# Patient Record
Sex: Male | Born: 1945 | Race: White | Hispanic: No | Marital: Married | Smoking: Never smoker
Health system: Southern US, Community
[De-identification: ages and names within clinical notes are randomized; demographics above are authoritative.]

## PROBLEM LIST (undated history)

## (undated) DIAGNOSIS — R1310 Dysphagia, unspecified: Secondary | ICD-10-CM

## (undated) DIAGNOSIS — K219 Gastro-esophageal reflux disease without esophagitis: Secondary | ICD-10-CM

## (undated) DIAGNOSIS — K7581 Nonalcoholic steatohepatitis (NASH): Secondary | ICD-10-CM

## (undated) DIAGNOSIS — K7460 Unspecified cirrhosis of liver: Secondary | ICD-10-CM

## (undated) DIAGNOSIS — G709 Myoneural disorder, unspecified: Secondary | ICD-10-CM

## (undated) DIAGNOSIS — I10 Essential (primary) hypertension: Secondary | ICD-10-CM

## (undated) DIAGNOSIS — E785 Hyperlipidemia, unspecified: Secondary | ICD-10-CM

## (undated) DIAGNOSIS — D696 Thrombocytopenia, unspecified: Secondary | ICD-10-CM

## (undated) HISTORY — DX: Hyperlipidemia, unspecified: E78.5

## (undated) HISTORY — PX: CHOLECYSTECTOMY: SHX55

## (undated) HISTORY — DX: Essential (primary) hypertension: I10

## (undated) HISTORY — PX: STOMACH SURGERY: SHX791

## (undated) HISTORY — DX: Thrombocytopenia, unspecified: D69.6

## (undated) HISTORY — DX: Dysphagia, unspecified: R13.10

## (undated) HISTORY — DX: Nonalcoholic steatohepatitis (NASH): K75.81

## (undated) HISTORY — DX: Myoneural disorder, unspecified: G70.9

---

## 2007-11-27 ENCOUNTER — Inpatient Hospital Stay (HOSPITAL_COMMUNITY): Admission: EM | Admit: 2007-11-27 | Discharge: 2007-11-29 | Payer: Self-pay | Admitting: Emergency Medicine

## 2007-11-28 ENCOUNTER — Encounter (INDEPENDENT_AMBULATORY_CARE_PROVIDER_SITE_OTHER): Payer: Self-pay | Admitting: General Surgery

## 2011-03-03 NOTE — Op Note (Signed)
NAME:  Peter Nichols, Peter Nichols NO.:  [id number]   MEDICAL RECORD NO.:  [id number]          PATIENT TYPE:  INP   LOCATION:  0737                         FACILITY:  [facility]   PHYSICIAN:  Gwenyth Ober, M.D.    DATE OF BIRTH:  03-24-1946   DATE OF PROCEDURE:  11/28/2007  DATE OF DISCHARGE:                               OPERATIVE REPORT   PREOPERATIVE DIAGNOSIS:  Symptomatic cholelithiasis and biliary colic.   POSTOPERATIVE DIAGNOSIS:  Acute cholecystitis, with cholelithiasis.   PROCEDURE:  Laparoscopic cholecystectomy, with laparoscopic enterolysis.   SURGEON:  Gwenyth Ober, M.D.   ANESTHESIA:  General endotracheal.   ESTIMATED BLOOD LOSS:  Less than 100 mL.   COMPLICATIONS:  Evulsion of cystic duct.   CONDITION:  Stable.   A 19 mm Blake drain was left in Morison's pouch and brought out the  right upper quadrant.   INDICATIONS FOR OPERATION:  The patient is a 65 year old with abdominal  pain localized to the right upper quadrant, a CT scan demonstrating a  gallstone at the neck of the gallbladder, without evidence of acute  cholecystitis.  He was brought to the operating room for a laparoscopic  cholecystectomy.   OPERATION:  The patient was taken to the operating room and placed on  the table in the supine position.  After an adequate endotracheal  anesthetic was administered, he was prepped and draped in the usual  sterile manner, exposing the midline and right upper quadrant.   A supraumbilical midline incision was made using a #15 blade and taken  down to the midline fascia.  We incised the fascia with a #15 blade,  then grabbed the edges with a Kocher clamp.  We then bluntly dissected  down with a hemostat clamp into the peritoneal cavity, running into some  omental adhesions to the anterior abdominal wall at the site of the  patient's previous midline incision.  We were able to find a window in  the inferolateral aspect of the incision over to the right,  and we were  able to dissect down into that area.  We then passed a pursestring  suture of 0 Vicryl around the fascial opening, in which we secured a  Hasson cannula which was subsequently passed with ease into the  peritoneal cavity.   Upon passing a 10 mm laparoscope into the peritoneal cavity through the  supraumbilical cannula, we were able to get around the omental adhesions  by going inferolaterally to the right and then up towards the right  upper quadrant.  We placed a 5 mm cannula into the peritoneal cavity  under direct vision, then used a 5 mm laparoscopic in order to inspect  the area of the supraumbilical cannula.  We could see the omental  adhesions that were impeding our progress, but we did not temperature to  take all of these down, just those that were near the subxiphoid area,  where we placed a 10/11 mm cannula under direct vision.  We placed a  second 5 mm cannula medially in the right upper quadrant.  With all  those in place,  we were able to pass the 10 mm cannula back through the  supraumbilical cannula site towards the right upper quadrant.   There was a significant amount of inflammation on the gallbladder which  was placed medially.  We were able to free up a lot of the omental  adhesions and surrounding adhesions from the gallbladder as we dissected  the gallbladder free and retracted towards the anterior abdominal wall  on the right upper quadrant.  It took a lot of dissection, and there was  a significant amount of bleeding, although not enough to require  transfusion or for the patient to become hypotensive.  It took a  considerable amount of time in order to dissect out the body and the  dome of the gallbladder and then subsequently the infundibulum, which we  were eventually able to find.  The cystic duct, which came along the  anteromedial aspect of the gallbladder wall, was partially torn during  the dissection and had to be clipped on the gallbladder  side prior to  finding its origin near the triangle of Calot and the hepatoduodenal  triangle.  Once we had dissected down to that area, which had a  significant amount of inflammation, we were able to dissect out the  cystic duct and the cystic artery and clip them appropriately.  We  clipped along the gallbladder side and made a cholecystodochotomy, and  upon attempting to pass the cholangiogram into the peritoneal cavity the  cystic duct tore at the site where it was transected, which was several  millimeters distal to the cystic duct/common duct junction.  Because of  this, no attempt was made to perform a cholangiogram at that point.  We  removed the cholangiocatheter from the peritoneal cavity, which had not  entered into the cystic duct, and then clipped the cystic duct x58m with  several centimeters and millimeters to spare.  We then clipped the  cystic artery proximally and distally, transected, and then dissected  out the gallbladder from its bed.  The gallbladder tore during the  dissection and spilled bilious contents into the Morison's pouch, which  we irrigated out copiously but did not include stones, just bile fluid.  The gallbladder was dissected out with minimal difficulty.  We did use  an EndoCatch bag to bring it out from the subxiphoid site, not the  supraumbilical site.  It was a very thickened gallbladder, with stones  contained within.   Once we removed the gallbladder, we irrigated with about 3 liters of  saline solution in the right upper quadrant, Morison's pouch, the  subhepatic space, and also down into the pelvis.  Once we had adequately  cleared out most of the effluent and most of the old blood loss, there  was no acute bleeding.  We cauterized the hepatic bed adequately.  We  placed a 19 mm Blake drain into Morison's pouch, passing alligator  through the lateralmost 5 mm cannula into the subxiphoid trocar and  bringing out the tubing into Morison's pouch  and then intact bringing  out skin in the right upper quadrant.  We secured it in place with a 3-0  nylon suture.  With that being done, we aspirated one more time above  the liver.  All fluid and gas were removed with all cannulas.   The supraumbilical fascial site was closed using the pursestring suture  which was in place.  We injected 0.25% Marcaine with epinephrine at all  sites except for around the  Blake drain.  We closed the skin at the  subxiphoid and supraumbilical site using a running subcuticular stitch  of 4-0 Vicryl.  We applied Dermabond to closed skin.  We closed the  remaining 5 mm cannula site with Dermabond.  We placed Steri-Strips and  Tegaderm.  All counts were correct.  We placed some sponges around the  drain site.  All needle, sponge counts, and instrument counts were  correct.      Gwenyth Ober, M.D.  Electronically Signed     JOW/MEDQ  D:  11/28/2007  T:  11/29/2007  Job:  [id number]
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
## 2011-07-10 LAB — DIFFERENTIAL
Basophils Absolute: 0
Basophils Relative: 0
Basophils Relative: 1
Eosinophils Absolute: 0.1
Eosinophils Relative: 0
Lymphocytes Relative: 11 — ABNORMAL LOW
Lymphocytes Relative: 4 — ABNORMAL LOW
Lymphs Abs: 0.5 — ABNORMAL LOW
Monocytes Absolute: 0.3
Monocytes Absolute: 0.6
Monocytes Relative: 9
Neutro Abs: 4.7
Neutrophils Relative %: 74
Neutrophils Relative %: 83 — ABNORMAL HIGH

## 2011-07-10 LAB — CBC
HCT: 35.2 — ABNORMAL LOW
HCT: 49
Hemoglobin: 12.5 — ABNORMAL LOW
MCHC: 35.5
MCHC: 35.6
MCV: 94.7
MCV: 94.9
MCV: 95.5
Platelets: 108 — ABNORMAL LOW
Platelets: 146 — ABNORMAL LOW
Platelets: 158
RDW: 12.3
RDW: 12.5
WBC: 5.7

## 2011-07-10 LAB — COMPREHENSIVE METABOLIC PANEL
ALT: 122 — ABNORMAL HIGH
AST: 43 — ABNORMAL HIGH
Albumin: 2.6 — ABNORMAL LOW
Albumin: 3.4 — ABNORMAL LOW
Alkaline Phosphatase: 42
BUN: 4 — ABNORMAL LOW
CO2: 24
Calcium: 8.7
Chloride: 99
Creatinine, Ser: 0.88
Creatinine, Ser: 0.98
GFR calc Af Amer: >60
GFR calc Af Amer: >60
Glucose, Bld: 110 — ABNORMAL HIGH
Glucose, Bld: 124 — ABNORMAL HIGH
Potassium: 3.3 — ABNORMAL LOW
Total Bilirubin: 3.1 — ABNORMAL HIGH
Total Protein: 7.2

## 2011-07-10 LAB — URINALYSIS, ROUTINE W REFLEX MICROSCOPIC
Bilirubin Urine: NEGATIVE
Hgb urine dipstick: NEGATIVE
Protein, ur: NEGATIVE
Urobilinogen, UA: 1

## 2012-02-17 DIAGNOSIS — R7989 Other specified abnormal findings of blood chemistry: Secondary | ICD-10-CM | POA: Diagnosis not present

## 2012-03-21 DIAGNOSIS — I10 Essential (primary) hypertension: Secondary | ICD-10-CM | POA: Diagnosis not present

## 2012-03-21 DIAGNOSIS — Z1211 Encounter for screening for malignant neoplasm of colon: Secondary | ICD-10-CM | POA: Diagnosis not present

## 2012-03-21 DIAGNOSIS — L989 Disorder of the skin and subcutaneous tissue, unspecified: Secondary | ICD-10-CM | POA: Diagnosis not present

## 2012-03-21 DIAGNOSIS — Z23 Encounter for immunization: Secondary | ICD-10-CM | POA: Diagnosis not present

## 2012-03-21 DIAGNOSIS — Z125 Encounter for screening for malignant neoplasm of prostate: Secondary | ICD-10-CM | POA: Diagnosis not present

## 2012-03-21 DIAGNOSIS — E7800 Pure hypercholesterolemia, unspecified: Secondary | ICD-10-CM | POA: Diagnosis not present

## 2012-03-21 DIAGNOSIS — Z0000 Encounter for general adult medical examination without abnormal findings: Secondary | ICD-10-CM | POA: Diagnosis not present

## 2012-09-19 DIAGNOSIS — I10 Essential (primary) hypertension: Secondary | ICD-10-CM | POA: Diagnosis not present

## 2012-09-19 DIAGNOSIS — E785 Hyperlipidemia, unspecified: Secondary | ICD-10-CM | POA: Diagnosis not present

## 2012-09-19 DIAGNOSIS — M62838 Other muscle spasm: Secondary | ICD-10-CM | POA: Diagnosis not present

## 2012-09-19 DIAGNOSIS — D696 Thrombocytopenia, unspecified: Secondary | ICD-10-CM | POA: Diagnosis not present

## 2012-09-19 DIAGNOSIS — Z125 Encounter for screening for malignant neoplasm of prostate: Secondary | ICD-10-CM | POA: Diagnosis not present

## 2012-09-19 DIAGNOSIS — R7301 Impaired fasting glucose: Secondary | ICD-10-CM | POA: Diagnosis not present

## 2012-09-19 DIAGNOSIS — L989 Disorder of the skin and subcutaneous tissue, unspecified: Secondary | ICD-10-CM | POA: Diagnosis not present

## 2012-11-21 DIAGNOSIS — D235 Other benign neoplasm of skin of trunk: Secondary | ICD-10-CM | POA: Diagnosis not present

## 2012-11-21 DIAGNOSIS — L820 Inflamed seborrheic keratosis: Secondary | ICD-10-CM | POA: Diagnosis not present

## 2012-11-21 DIAGNOSIS — L570 Actinic keratosis: Secondary | ICD-10-CM | POA: Diagnosis not present

## 2012-12-15 DIAGNOSIS — J329 Chronic sinusitis, unspecified: Secondary | ICD-10-CM | POA: Diagnosis not present

## 2013-09-08 DIAGNOSIS — E785 Hyperlipidemia, unspecified: Secondary | ICD-10-CM | POA: Diagnosis not present

## 2013-09-08 DIAGNOSIS — Z1211 Encounter for screening for malignant neoplasm of colon: Secondary | ICD-10-CM | POA: Diagnosis not present

## 2013-09-08 DIAGNOSIS — Z125 Encounter for screening for malignant neoplasm of prostate: Secondary | ICD-10-CM | POA: Diagnosis not present

## 2013-09-08 DIAGNOSIS — Z0000 Encounter for general adult medical examination without abnormal findings: Secondary | ICD-10-CM | POA: Diagnosis not present

## 2013-09-08 DIAGNOSIS — R946 Abnormal results of thyroid function studies: Secondary | ICD-10-CM | POA: Diagnosis not present

## 2013-09-08 DIAGNOSIS — E039 Hypothyroidism, unspecified: Secondary | ICD-10-CM | POA: Diagnosis not present

## 2013-09-08 DIAGNOSIS — I10 Essential (primary) hypertension: Secondary | ICD-10-CM | POA: Diagnosis not present

## 2013-09-08 DIAGNOSIS — Z23 Encounter for immunization: Secondary | ICD-10-CM | POA: Diagnosis not present

## 2013-12-08 DIAGNOSIS — K7689 Other specified diseases of liver: Secondary | ICD-10-CM | POA: Diagnosis not present

## 2013-12-08 DIAGNOSIS — R946 Abnormal results of thyroid function studies: Secondary | ICD-10-CM | POA: Diagnosis not present

## 2014-02-06 ENCOUNTER — Other Ambulatory Visit: Payer: Self-pay | Admitting: Gastroenterology

## 2014-02-06 DIAGNOSIS — K5730 Diverticulosis of large intestine without perforation or abscess without bleeding: Secondary | ICD-10-CM | POA: Diagnosis not present

## 2014-02-06 DIAGNOSIS — Z1211 Encounter for screening for malignant neoplasm of colon: Secondary | ICD-10-CM | POA: Diagnosis not present

## 2014-02-06 DIAGNOSIS — D126 Benign neoplasm of colon, unspecified: Secondary | ICD-10-CM | POA: Diagnosis not present

## 2014-03-29 DIAGNOSIS — R946 Abnormal results of thyroid function studies: Secondary | ICD-10-CM | POA: Diagnosis not present

## 2014-07-02 DIAGNOSIS — R946 Abnormal results of thyroid function studies: Secondary | ICD-10-CM | POA: Diagnosis not present

## 2014-10-30 DIAGNOSIS — Z1389 Encounter for screening for other disorder: Secondary | ICD-10-CM | POA: Diagnosis not present

## 2014-10-30 DIAGNOSIS — E785 Hyperlipidemia, unspecified: Secondary | ICD-10-CM | POA: Diagnosis not present

## 2014-10-30 DIAGNOSIS — Z23 Encounter for immunization: Secondary | ICD-10-CM | POA: Diagnosis not present

## 2014-10-30 DIAGNOSIS — R946 Abnormal results of thyroid function studies: Secondary | ICD-10-CM | POA: Diagnosis not present

## 2014-10-30 DIAGNOSIS — Z125 Encounter for screening for malignant neoplasm of prostate: Secondary | ICD-10-CM | POA: Diagnosis not present

## 2014-10-30 DIAGNOSIS — K7581 Nonalcoholic steatohepatitis (NASH): Secondary | ICD-10-CM | POA: Diagnosis not present

## 2014-10-30 DIAGNOSIS — I10 Essential (primary) hypertension: Secondary | ICD-10-CM | POA: Diagnosis not present

## 2014-11-08 DIAGNOSIS — H02831 Dermatochalasis of right upper eyelid: Secondary | ICD-10-CM | POA: Diagnosis not present

## 2014-11-08 DIAGNOSIS — H02834 Dermatochalasis of left upper eyelid: Secondary | ICD-10-CM | POA: Diagnosis not present

## 2014-11-08 DIAGNOSIS — H2513 Age-related nuclear cataract, bilateral: Secondary | ICD-10-CM | POA: Diagnosis not present

## 2014-12-03 DIAGNOSIS — L570 Actinic keratosis: Secondary | ICD-10-CM | POA: Diagnosis not present

## 2014-12-03 DIAGNOSIS — X32XXXD Exposure to sunlight, subsequent encounter: Secondary | ICD-10-CM | POA: Diagnosis not present

## 2014-12-03 DIAGNOSIS — L821 Other seborrheic keratosis: Secondary | ICD-10-CM | POA: Diagnosis not present

## 2014-12-03 DIAGNOSIS — L820 Inflamed seborrheic keratosis: Secondary | ICD-10-CM | POA: Diagnosis not present

## 2014-12-03 DIAGNOSIS — L814 Other melanin hyperpigmentation: Secondary | ICD-10-CM | POA: Diagnosis not present

## 2015-05-02 DIAGNOSIS — Z0000 Encounter for general adult medical examination without abnormal findings: Secondary | ICD-10-CM | POA: Diagnosis not present

## 2015-05-02 DIAGNOSIS — K7581 Nonalcoholic steatohepatitis (NASH): Secondary | ICD-10-CM | POA: Diagnosis not present

## 2015-05-02 DIAGNOSIS — E785 Hyperlipidemia, unspecified: Secondary | ICD-10-CM | POA: Diagnosis not present

## 2015-05-02 DIAGNOSIS — Z1389 Encounter for screening for other disorder: Secondary | ICD-10-CM | POA: Diagnosis not present

## 2015-05-02 DIAGNOSIS — I10 Essential (primary) hypertension: Secondary | ICD-10-CM | POA: Diagnosis not present

## 2015-11-15 DIAGNOSIS — I10 Essential (primary) hypertension: Secondary | ICD-10-CM | POA: Diagnosis not present

## 2015-11-15 DIAGNOSIS — J329 Chronic sinusitis, unspecified: Secondary | ICD-10-CM | POA: Diagnosis not present

## 2015-11-15 DIAGNOSIS — K7581 Nonalcoholic steatohepatitis (NASH): Secondary | ICD-10-CM | POA: Diagnosis not present

## 2015-11-15 DIAGNOSIS — E785 Hyperlipidemia, unspecified: Secondary | ICD-10-CM | POA: Diagnosis not present

## 2015-11-15 DIAGNOSIS — Z125 Encounter for screening for malignant neoplasm of prostate: Secondary | ICD-10-CM | POA: Diagnosis not present

## 2015-11-18 DIAGNOSIS — R7302 Impaired glucose tolerance (oral): Secondary | ICD-10-CM | POA: Diagnosis not present

## 2015-12-17 DIAGNOSIS — D696 Thrombocytopenia, unspecified: Secondary | ICD-10-CM | POA: Diagnosis not present

## 2015-12-17 DIAGNOSIS — K7581 Nonalcoholic steatohepatitis (NASH): Secondary | ICD-10-CM | POA: Diagnosis not present

## 2016-02-17 DIAGNOSIS — K7581 Nonalcoholic steatohepatitis (NASH): Secondary | ICD-10-CM | POA: Diagnosis not present

## 2016-07-01 DIAGNOSIS — Z1389 Encounter for screening for other disorder: Secondary | ICD-10-CM | POA: Diagnosis not present

## 2016-07-01 DIAGNOSIS — E785 Hyperlipidemia, unspecified: Secondary | ICD-10-CM | POA: Diagnosis not present

## 2016-07-01 DIAGNOSIS — I10 Essential (primary) hypertension: Secondary | ICD-10-CM | POA: Diagnosis not present

## 2016-07-01 DIAGNOSIS — Z0000 Encounter for general adult medical examination without abnormal findings: Secondary | ICD-10-CM | POA: Diagnosis not present

## 2016-09-08 DIAGNOSIS — M19012 Primary osteoarthritis, left shoulder: Secondary | ICD-10-CM | POA: Diagnosis not present

## 2017-02-03 DIAGNOSIS — R740 Nonspecific elevation of levels of transaminase and lactic acid dehydrogenase [LDH]: Secondary | ICD-10-CM | POA: Diagnosis not present

## 2017-02-24 DIAGNOSIS — D696 Thrombocytopenia, unspecified: Secondary | ICD-10-CM | POA: Diagnosis not present

## 2017-03-22 ENCOUNTER — Encounter: Payer: Self-pay | Admitting: Hematology and Oncology

## 2017-03-22 ENCOUNTER — Telehealth: Payer: Self-pay | Admitting: Hematology and Oncology

## 2017-03-22 NOTE — Telephone Encounter (Signed)
Pt cld to schedule a hem appt. Appt has been scheduled for the pt to see DR. Gudena on 7/17 at 1pm. A earlier date was offered, but pt stated he will be out of town. Letter mailed to the pt and faxed to the referring.

## 2017-04-05 DIAGNOSIS — L570 Actinic keratosis: Secondary | ICD-10-CM | POA: Diagnosis not present

## 2017-04-05 DIAGNOSIS — X32XXXD Exposure to sunlight, subsequent encounter: Secondary | ICD-10-CM | POA: Diagnosis not present

## 2017-05-04 ENCOUNTER — Ambulatory Visit (HOSPITAL_COMMUNITY)
Admission: RE | Admit: 2017-05-04 | Discharge: 2017-05-04 | Disposition: A | Discharge status: Home / Self Care | Payer: Medicare Other | Source: Ambulatory Visit | Attending: Vascular Surgery | Admitting: Vascular Surgery

## 2017-05-04 ENCOUNTER — Ambulatory Visit (HOSPITAL_BASED_OUTPATIENT_CLINIC_OR_DEPARTMENT_OTHER): Payer: Medicare Other | Admitting: Hematology and Oncology

## 2017-05-04 ENCOUNTER — Ambulatory Visit (HOSPITAL_BASED_OUTPATIENT_CLINIC_OR_DEPARTMENT_OTHER): Payer: Medicare Other

## 2017-05-04 DIAGNOSIS — D696 Thrombocytopenia, unspecified: Secondary | ICD-10-CM | POA: Diagnosis not present

## 2017-05-04 LAB — CBC WITH DIFFERENTIAL/PLATELET
BASO%: 1.1 % (ref 0.0–2.0)
BASOS ABS: 0.1 10*3/uL (ref 0.0–0.1)
EOS ABS: 0.3 10*3/uL (ref 0.0–0.5)
EOS%: 5.1 % (ref 0.0–7.0)
HCT: 45.9 % (ref 38.4–49.9)
HGB: 16 g/dL (ref 13.0–17.1)
LYMPH%: 23.9 % (ref 14.0–49.0)
MCH: 33 pg (ref 27.2–33.4)
MCHC: 34.8 g/dL (ref 32.0–36.0)
MCV: 94.8 fL (ref 79.3–98.0)
MONO#: 0.6 10*3/uL (ref 0.1–0.9)
MONO%: 10.8 % (ref 0.0–14.0)
NEUT#: 3.3 10*3/uL (ref 1.5–6.5)
NEUT%: 59.1 % (ref 39.0–75.0)
PLATELETS: 100 10*3/uL — AB (ref 140–400)
RBC: 4.84 10*6/uL (ref 4.20–5.82)
RDW: 13.8 % (ref 11.0–14.6)
WBC: 5.6 10*3/uL (ref 4.0–10.3)
lymph#: 1.3 10*3/uL (ref 0.9–3.3)

## 2017-05-04 NOTE — Assessment & Plan Note (Signed)
Most likely related to alcohol abuse 02/24/2017 Platelet count was 89; ALT 43, AST 56, bilirubin 2, hepatitis B and C negative 10/30/2014: Platelet count 151 11/15/2015: Platelet count 107  I discussed with the patient extensively about the different ways alcohol can cause thrombocytopenia 1. Primary bone marrow toxin: It could cause dysfunction in the megakaryocytes 2. liver cirrhosis causing splenomegaly which can decrease the platelet count  Recommendation: Stop alcohol use completely and join AA program There is no need to perform a bone marrow biopsy. If he does not quit drinking then it would progressively get worse.  We are happy to see the patient on an as-needed basis. No further testing or interventions are needed at his current platelet levels. Return to clinic on an as-needed basis

## 2017-05-04 NOTE — Progress Notes (Signed)
Per Dr.Gudena, pt to have ultrasound of bilateral lower extremities today and to schedule pt for ultrasound of the abdomen next week. Pt notified and is aware. Pt scheduled at the vascular and vein specialist today at 3pm for stat US BLE r/o dvt.
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
## 2017-05-05 LAB — VITAMIN B12: VITAMIN B 12: 371 pg/mL (ref 232–1245)

## 2017-05-10 ENCOUNTER — Ambulatory Visit (HOSPITAL_COMMUNITY)
Admission: RE | Admit: 2017-05-10 | Discharge: 2017-05-10 | Disposition: A | Discharge status: Home / Self Care | Payer: Medicare Other | Source: Ambulatory Visit | Attending: Hematology and Oncology | Admitting: Hematology and Oncology

## 2017-05-10 DIAGNOSIS — D696 Thrombocytopenia, unspecified: Secondary | ICD-10-CM | POA: Insufficient documentation

## 2017-05-10 DIAGNOSIS — R161 Splenomegaly, not elsewhere classified: Secondary | ICD-10-CM | POA: Diagnosis not present

## 2017-05-10 DIAGNOSIS — Z9049 Acquired absence of other specified parts of digestive tract: Secondary | ICD-10-CM | POA: Diagnosis not present

## 2017-05-11 ENCOUNTER — Ambulatory Visit: Payer: PRIVATE HEALTH INSURANCE | Admitting: Hematology and Oncology

## 2017-05-11 NOTE — Assessment & Plan Note (Deleted)
Most likely related to alcohol abuse 02/24/2017 Platelet count was 89; ALT 43, AST 56, bilirubin 2, hepatitis B and C negative 10/30/2014: Platelet count 151 11/15/2015: Platelet count 107 Ultrasound abdomen 05/10/2017: Splenomegaly, fatty liver  I discussed with the patient extensively about the different ways alcohol can cause thrombocytopenia 1. Primary bone marrow toxin: It could cause dysfunction in the megakaryocytes 2. liver cirrhosis causing splenomegaly which can decrease the platelet count  Recommendation: Stop alcohol use completely and join AA program  Lower extremity swelling: Ultrasound of the legs negative for DVT. Patient is planning to retire at the end of this year.  Return to clinic on an as-needed basis.

## 2017-05-18 ENCOUNTER — Telehealth: Payer: Self-pay

## 2017-05-18 NOTE — Telephone Encounter (Signed)
Pt called for US abdomen result of 7/23. He states this is 2nd call today.

## 2017-07-05 DIAGNOSIS — K7581 Nonalcoholic steatohepatitis (NASH): Secondary | ICD-10-CM | POA: Diagnosis not present

## 2017-07-05 DIAGNOSIS — R946 Abnormal results of thyroid function studies: Secondary | ICD-10-CM | POA: Diagnosis not present

## 2017-07-05 DIAGNOSIS — D696 Thrombocytopenia, unspecified: Secondary | ICD-10-CM | POA: Diagnosis not present

## 2017-07-05 DIAGNOSIS — E785 Hyperlipidemia, unspecified: Secondary | ICD-10-CM | POA: Diagnosis not present

## 2017-07-05 DIAGNOSIS — I10 Essential (primary) hypertension: Secondary | ICD-10-CM | POA: Diagnosis not present

## 2017-07-05 DIAGNOSIS — Z23 Encounter for immunization: Secondary | ICD-10-CM | POA: Diagnosis not present

## 2017-07-05 DIAGNOSIS — Z0000 Encounter for general adult medical examination without abnormal findings: Secondary | ICD-10-CM | POA: Diagnosis not present

## 2017-08-04 DIAGNOSIS — D696 Thrombocytopenia, unspecified: Secondary | ICD-10-CM | POA: Diagnosis not present

## 2017-08-04 DIAGNOSIS — R740 Nonspecific elevation of levels of transaminase and lactic acid dehydrogenase [LDH]: Secondary | ICD-10-CM | POA: Diagnosis not present

## 2018-01-03 DIAGNOSIS — R946 Abnormal results of thyroid function studies: Secondary | ICD-10-CM | POA: Diagnosis not present

## 2018-02-14 DIAGNOSIS — L570 Actinic keratosis: Secondary | ICD-10-CM | POA: Diagnosis not present

## 2018-02-14 DIAGNOSIS — L820 Inflamed seborrheic keratosis: Secondary | ICD-10-CM | POA: Diagnosis not present

## 2018-02-14 DIAGNOSIS — X32XXXD Exposure to sunlight, subsequent encounter: Secondary | ICD-10-CM | POA: Diagnosis not present

## 2018-11-04 DIAGNOSIS — L570 Actinic keratosis: Secondary | ICD-10-CM | POA: Diagnosis not present

## 2018-11-04 DIAGNOSIS — S70362A Insect bite (nonvenomous), left thigh, initial encounter: Secondary | ICD-10-CM | POA: Diagnosis not present

## 2018-11-04 DIAGNOSIS — L986 Other infiltrative disorders of the skin and subcutaneous tissue: Secondary | ICD-10-CM | POA: Diagnosis not present

## 2018-11-04 DIAGNOSIS — L728 Other follicular cysts of the skin and subcutaneous tissue: Secondary | ICD-10-CM | POA: Diagnosis not present

## 2018-11-04 DIAGNOSIS — X32XXXD Exposure to sunlight, subsequent encounter: Secondary | ICD-10-CM | POA: Diagnosis not present

## 2019-01-04 ENCOUNTER — Ambulatory Visit
Admission: RE | Admit: 2019-01-04 | Discharge: 2019-01-04 | Disposition: A | Discharge status: Home / Self Care | Payer: PRIVATE HEALTH INSURANCE | Source: Ambulatory Visit | Attending: Family Medicine | Admitting: Family Medicine

## 2019-01-04 ENCOUNTER — Other Ambulatory Visit: Payer: Self-pay | Admitting: Family Medicine

## 2019-01-04 DIAGNOSIS — M7989 Other specified soft tissue disorders: Secondary | ICD-10-CM | POA: Diagnosis not present

## 2019-01-04 DIAGNOSIS — I10 Essential (primary) hypertension: Secondary | ICD-10-CM | POA: Diagnosis not present

## 2019-01-04 DIAGNOSIS — R7989 Other specified abnormal findings of blood chemistry: Secondary | ICD-10-CM

## 2019-01-04 DIAGNOSIS — D696 Thrombocytopenia, unspecified: Secondary | ICD-10-CM | POA: Diagnosis not present

## 2019-01-04 DIAGNOSIS — E785 Hyperlipidemia, unspecified: Secondary | ICD-10-CM | POA: Diagnosis not present

## 2019-01-04 DIAGNOSIS — K7581 Nonalcoholic steatohepatitis (NASH): Secondary | ICD-10-CM | POA: Diagnosis not present

## 2019-01-04 DIAGNOSIS — R600 Localized edema: Secondary | ICD-10-CM | POA: Diagnosis not present

## 2019-01-04 DIAGNOSIS — R946 Abnormal results of thyroid function studies: Secondary | ICD-10-CM | POA: Diagnosis not present

## 2019-01-04 DIAGNOSIS — F1020 Alcohol dependence, uncomplicated: Secondary | ICD-10-CM | POA: Diagnosis not present

## 2019-04-06 DIAGNOSIS — D696 Thrombocytopenia, unspecified: Secondary | ICD-10-CM | POA: Diagnosis not present

## 2019-04-06 DIAGNOSIS — E785 Hyperlipidemia, unspecified: Secondary | ICD-10-CM | POA: Diagnosis not present

## 2019-04-06 DIAGNOSIS — R7309 Other abnormal glucose: Secondary | ICD-10-CM | POA: Diagnosis not present

## 2019-04-06 DIAGNOSIS — F1020 Alcohol dependence, uncomplicated: Secondary | ICD-10-CM | POA: Diagnosis not present

## 2019-04-06 DIAGNOSIS — I10 Essential (primary) hypertension: Secondary | ICD-10-CM | POA: Diagnosis not present

## 2019-04-06 DIAGNOSIS — G250 Essential tremor: Secondary | ICD-10-CM | POA: Diagnosis not present

## 2019-08-17 DIAGNOSIS — Z86010 Personal history of colonic polyps: Secondary | ICD-10-CM | POA: Diagnosis not present

## 2019-08-17 DIAGNOSIS — D696 Thrombocytopenia, unspecified: Secondary | ICD-10-CM | POA: Diagnosis not present

## 2019-08-28 DIAGNOSIS — D696 Thrombocytopenia, unspecified: Secondary | ICD-10-CM | POA: Diagnosis not present

## 2019-08-28 DIAGNOSIS — X32XXXD Exposure to sunlight, subsequent encounter: Secondary | ICD-10-CM | POA: Diagnosis not present

## 2019-08-28 DIAGNOSIS — L570 Actinic keratosis: Secondary | ICD-10-CM | POA: Diagnosis not present

## 2019-08-31 DIAGNOSIS — F1020 Alcohol dependence, uncomplicated: Secondary | ICD-10-CM | POA: Diagnosis not present

## 2019-08-31 DIAGNOSIS — R7309 Other abnormal glucose: Secondary | ICD-10-CM | POA: Diagnosis not present

## 2019-08-31 DIAGNOSIS — D696 Thrombocytopenia, unspecified: Secondary | ICD-10-CM | POA: Diagnosis not present

## 2019-09-28 DIAGNOSIS — Z1159 Encounter for screening for other viral diseases: Secondary | ICD-10-CM | POA: Diagnosis not present

## 2019-09-29 DIAGNOSIS — I10 Essential (primary) hypertension: Secondary | ICD-10-CM | POA: Diagnosis not present

## 2019-09-29 DIAGNOSIS — F1020 Alcohol dependence, uncomplicated: Secondary | ICD-10-CM | POA: Diagnosis not present

## 2019-09-29 DIAGNOSIS — E785 Hyperlipidemia, unspecified: Secondary | ICD-10-CM | POA: Diagnosis not present

## 2019-09-29 DIAGNOSIS — D696 Thrombocytopenia, unspecified: Secondary | ICD-10-CM | POA: Diagnosis not present

## 2019-09-29 DIAGNOSIS — Z0000 Encounter for general adult medical examination without abnormal findings: Secondary | ICD-10-CM | POA: Diagnosis not present

## 2019-09-29 DIAGNOSIS — R946 Abnormal results of thyroid function studies: Secondary | ICD-10-CM | POA: Diagnosis not present

## 2019-09-29 DIAGNOSIS — M7989 Other specified soft tissue disorders: Secondary | ICD-10-CM | POA: Diagnosis not present

## 2019-09-29 DIAGNOSIS — K7581 Nonalcoholic steatohepatitis (NASH): Secondary | ICD-10-CM | POA: Diagnosis not present

## 2019-09-29 DIAGNOSIS — Z125 Encounter for screening for malignant neoplasm of prostate: Secondary | ICD-10-CM | POA: Diagnosis not present

## 2019-10-03 DIAGNOSIS — D120 Benign neoplasm of cecum: Secondary | ICD-10-CM | POA: Diagnosis not present

## 2019-10-03 DIAGNOSIS — K5730 Diverticulosis of large intestine without perforation or abscess without bleeding: Secondary | ICD-10-CM | POA: Diagnosis not present

## 2019-10-03 DIAGNOSIS — D123 Benign neoplasm of transverse colon: Secondary | ICD-10-CM | POA: Diagnosis not present

## 2019-10-03 DIAGNOSIS — Z86010 Personal history of colonic polyps: Secondary | ICD-10-CM | POA: Diagnosis not present

## 2019-10-06 DIAGNOSIS — D120 Benign neoplasm of cecum: Secondary | ICD-10-CM | POA: Diagnosis not present

## 2019-10-06 DIAGNOSIS — D123 Benign neoplasm of transverse colon: Secondary | ICD-10-CM | POA: Diagnosis not present

## 2019-10-27 ENCOUNTER — Telehealth (HOSPITAL_COMMUNITY): Payer: Self-pay

## 2019-10-27 NOTE — Telephone Encounter (Signed)
The above patient or their representative was contacted and gave the following answers to these questions:         Do you have any of the following symptoms?    NO  Fever                    Cough                   Shortness of breath  Do  you have any of the following other symptoms?    muscle pain         vomiting,        diarrhea        rash         weakness        red eye        abdominal pain         bruising          bruising or bleeding              joint pain           severe headache    Have you been in contact with someone who was or has been sick in the past 2 weeks?  NO  Yes                 Unsure                         Unable to assess   Does the person that you were in contact with have any of the following symptoms?   Cough         shortness of breath           muscle pain         vomiting,            diarrhea            rash            weakness           fever            red eye           abdominal pain           bruising  or  bleeding                joint pain                severe headache                 COMMENTS OR ACTION PLAN FOR THIS PATIENT:        All questions were answered/[facility]

## 2019-10-30 ENCOUNTER — Other Ambulatory Visit: Payer: Self-pay

## 2019-10-30 ENCOUNTER — Other Ambulatory Visit: Payer: Self-pay | Admitting: *Deleted

## 2019-10-30 ENCOUNTER — Encounter: Payer: Self-pay | Admitting: Surgery

## 2019-10-30 ENCOUNTER — Ambulatory Visit (HOSPITAL_COMMUNITY)
Admission: RE | Admit: 2019-10-30 | Discharge: 2019-10-30 | Disposition: A | Discharge status: Home / Self Care | Payer: Medicare HMO | Source: Ambulatory Visit | Attending: Surgery | Admitting: Surgery

## 2019-10-30 ENCOUNTER — Ambulatory Visit (INDEPENDENT_AMBULATORY_CARE_PROVIDER_SITE_OTHER): Payer: Medicare HMO | Admitting: Surgery

## 2019-10-30 VITALS — BP 126/65 | HR 55 | Temp 98.2°F | Resp 20 | Ht 70.0 in | Wt 196.0 lb

## 2019-10-30 DIAGNOSIS — R609 Edema, unspecified: Secondary | ICD-10-CM

## 2019-10-30 DIAGNOSIS — M7989 Other specified soft tissue disorders: Secondary | ICD-10-CM | POA: Diagnosis not present

## 2019-10-30 NOTE — Progress Notes (Signed)
[facility]  Patient name: Peter Nichols MRN: [id number] DOB: Oct 05, 1946 Sex: male   REQUESTING PROVIDER:    [name]   REASON FOR CONSULT:    Leg swelling  HISTORY OF PRESENT ILLNESS:   Peter Nichols is a 74 y.o. male, who is referred for evaluation of chronic leg swelling.  Patient states that this is been going on for many many years.  He has had several ultrasounds which were negative for DVT.  He has worn compression stockings briefly in the past but had difficulty tolerating them.  He denies any open wounds.  He is medically managed for hypertension.  He takes a statin for hypercholesterolemia.  He is a non-smoker.  PAST MEDICAL HISTORY    Past Medical History:  Diagnosis Date  . Dysphagia   . Hyperlipidemia   . Hypertension   . Neuromuscular disorder (HCC)    tremor  . Steatohepatitis, non-alcoholic   . Thrombocytopenia ([place])      FAMILY HISTORY   History reviewed. No pertinent family history.  SOCIAL HISTORY:   Social History   Socioeconomic History  . Marital status: Married    Spouse name: Not on file  . Number of children: Not on file  . Years of education: Not on file  . Highest education level: Not on file  Occupational History  . Not on file  Tobacco Use  . Smoking status: Never Smoker  . Smokeless tobacco: Never Used  Substance and Sexual Activity  . Alcohol use: Yes  . Drug use: Never  . Sexual activity: Not on file  Other Topics Concern  . Not on file  Social History Narrative  . Not on file   Social Determinants of Health   Financial Resource Strain:   . Difficulty of Paying Living Expenses: Not on file  Food Insecurity:   . Worried About Charity fundraiser in the Last Year: Not on file  . Ran Out of Food in the Last Year: Not on file  Transportation Needs:   . Lack of Transportation (Medical): Not on file  . Lack of Transportation (Non-Medical): Not on  file  Physical Activity:   . Days of Exercise per Week: Not on file  . Minutes of Exercise per Session: Not on file  Stress:   . Feeling of Stress : Not on file  Social Connections:   . Frequency of Communication with Friends and Family: Not on file  . Frequency of Social Gatherings with Friends and Family: Not on file  . Attends Religious Services: Not on file  . Active Member of Clubs or Organizations: Not on file  . Attends Archivist Meetings: Not on file  . Marital Status: Not on file  Intimate Partner Violence:   . Fear of Current or Ex-Partner: Not on file  . Emotionally Abused: Not on file  . Physically Abused: Not on file  . Sexually Abused: Not on file    ALLERGIES:    No Known Allergies  CURRENT MEDICATIONS:    Current Outpatient Medications  Medication Sig Dispense Refill  . amLODipine (NORVASC) 5 MG tablet Take 5 mg by mouth daily.     . benazepril (LOTENSIN) 20 MG tablet Take 20 mg by mouth daily.     . chlorthalidone (HYGROTON) 25 MG tablet Take 25 mg by mouth daily.     . pravastatin (PRAVACHOL) 40 MG tablet Take 40 mg by mouth daily.     . propranolol  ER (INDERAL LA) 60 MG 24 hr capsule Take 60 mg by mouth daily.      No current facility-administered medications for this visit.    REVIEW OF SYSTEMS:   [X]  denotes positive finding, [ ]  denotes negative finding Cardiac  Comments:  Chest pain or chest pressure:    Shortness of breath upon exertion:    Short of breath when lying flat:    Irregular heart rhythm:        Vascular    Pain in calf, thigh, or hip brought on by ambulation:    Pain in feet at night that wakes you up from your sleep:     Blood clot in your veins:    Leg swelling:  x       Pulmonary    Oxygen at home:    Productive cough:     Wheezing:         Neurologic    Sudden weakness in arms or legs:     Sudden numbness in arms or legs:     Sudden onset of difficulty speaking or slurred speech:    Temporary loss of  vision in one eye:     Problems with dizziness:         Gastrointestinal    Blood in stool:      Vomited blood:         Genitourinary    Burning when urinating:     Blood in urine:        Psychiatric    Major depression:         Hematologic    Bleeding problems:    Problems with blood clotting too easily:        Skin    Rashes or ulcers:        Constitutional    Fever or chills:     PHYSICAL EXAM:   Vitals:   10/30/19 0853  BP: 126/65  Pulse: (!) 55  Resp: 20  Temp: 98.2 F (36.8 C)  SpO2: 98%  Weight: 196 lb (88.9 kg)  Height: 5' 10"  (1.778 m)    GENERAL: The patient is a well-nourished male, in no acute distress. The vital signs are documented above. CARDIAC: There is a regular rate and rhythm.  VASCULAR: Palpable dorsalis pedis pulse bilaterally.  1-2+ bilateral pitting edema with hyperpigmentation in the medial ankle bilaterally PULMONARY: Nonlabored respirations ABDOMEN: Soft and non-tender with normal pitched bowel sounds.  NEUROLOGIC: No focal weakness or paresthesias are detected. SKIN: There are no ulcers or rashes noted. PSYCHIATRIC: The patient has a normal affect.  STUDIES:   I have reviewed the following: Right: No evidence of common femoral vein obstruction. Left: No evidence of DVT, SVT, or Baker's cyst. The sapheno-femoral junction is competent. No evidence of GSV reflux. No evidence of SSV reflux. No evidence of deep venous reflux.  ASSESSMENT and PLAN   Chronic leg swelling: Venous evaluation was unremarkable.  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  However he will need confirmation  that this is not a manifestation of renal insufficiency or heart failure.  He may benefit from a trial of diuretics, however this usually does not have a significant impact.  I have recommended that he wear 20-30 compression stockings.  We also discussed that if he had difficulty with the knee-high, he may benefit from thigh-high stockings.  I  am also making a referral to the lymphedema clinic for further management.   Annamarie Major, IV, MD, FACS  [facility] [phone] Pager [phone]

## 2019-12-03 ENCOUNTER — Ambulatory Visit: Payer: Medicare HMO | Attending: Internal Medicine

## 2019-12-03 DIAGNOSIS — Z23 Encounter for immunization: Secondary | ICD-10-CM | POA: Insufficient documentation

## 2019-12-03 NOTE — Progress Notes (Signed)
   Covid-19 Vaccination Clinic  Name:  Peter Nichols    MRN: [id number] DOB: [birth year]  12/03/2019  [name] was observed post Covid-19 immunization for 15 minutes without incidence. He was provided with Vaccine Information Sheet and instruction to access the V-Safe system.   [name] was instructed to call 911 with any severe reactions post vaccine: Marland Kitchen Difficulty breathing  . Swelling of your face and throat  . A fast heartbeat  . A bad rash all over your body  . Dizziness and weakness    Immunizations Administered    Name Date Dose VIS Date Route   Pfizer COVID-19 Vaccine 12/03/2019  8:56 AM 0.3 mL 09/29/2019 Intramuscular   Manufacturer: [place]   Lot: [id number]   [place]: [address]-2

## 2019-12-25 ENCOUNTER — Ambulatory Visit: Payer: Medicare HMO | Attending: Internal Medicine

## 2019-12-25 DIAGNOSIS — Z23 Encounter for immunization: Secondary | ICD-10-CM | POA: Insufficient documentation

## 2019-12-25 NOTE — Progress Notes (Signed)
   Covid-19 Vaccination Clinic  Name:  Peter Nichols    MRN: [id number] DOB: 1946-04-17  12/25/2019  Mr. Scheurich was observed post Covid-19 immunization for 15 minutes without incident. He was provided with Vaccine Information Sheet and instruction to access the V-Safe system.   Mr. Gidney was instructed to call 911 with any severe reactions post vaccine: Marland Kitchen Difficulty breathing  . Swelling of face and throat  . A fast heartbeat  . A bad rash all over body  . Dizziness and weakness   Immunizations Administered    Name Date Dose VIS Date Route   Pfizer COVID-19 Vaccine 12/25/2019  5:56 PM 0.3 mL 09/29/2019 Intramuscular   Manufacturer: [place]   Lot: [id number]   [place]: [id number]

## 2020-03-29 DIAGNOSIS — I890 Lymphedema, not elsewhere classified: Secondary | ICD-10-CM | POA: Diagnosis not present

## 2020-03-29 DIAGNOSIS — Z125 Encounter for screening for malignant neoplasm of prostate: Secondary | ICD-10-CM | POA: Diagnosis not present

## 2020-03-29 DIAGNOSIS — D696 Thrombocytopenia, unspecified: Secondary | ICD-10-CM | POA: Diagnosis not present

## 2020-03-29 DIAGNOSIS — E785 Hyperlipidemia, unspecified: Secondary | ICD-10-CM | POA: Diagnosis not present

## 2020-03-29 DIAGNOSIS — R946 Abnormal results of thyroid function studies: Secondary | ICD-10-CM | POA: Diagnosis not present

## 2020-03-29 DIAGNOSIS — I10 Essential (primary) hypertension: Secondary | ICD-10-CM | POA: Diagnosis not present

## 2020-03-29 DIAGNOSIS — F1020 Alcohol dependence, uncomplicated: Secondary | ICD-10-CM | POA: Diagnosis not present

## 2020-07-15 DIAGNOSIS — X32XXXD Exposure to sunlight, subsequent encounter: Secondary | ICD-10-CM | POA: Diagnosis not present

## 2020-07-15 DIAGNOSIS — L570 Actinic keratosis: Secondary | ICD-10-CM | POA: Diagnosis not present

## 2020-07-17 DIAGNOSIS — H903 Sensorineural hearing loss, bilateral: Secondary | ICD-10-CM | POA: Diagnosis not present

## 2020-08-31 ENCOUNTER — Ambulatory Visit: Payer: Medicare HMO | Attending: Internal Medicine

## 2020-08-31 DIAGNOSIS — Z23 Encounter for immunization: Secondary | ICD-10-CM

## 2020-08-31 NOTE — Progress Notes (Signed)
   Covid-19 Vaccination Clinic  Name:  Peter Nichols    MRN: [id number] DOB: Dec 08, 1945  08/31/2020  Peter Nichols was observed post Covid-19 immunization for 15 minutes without incident. He was provided with Vaccine Information Sheet and instruction to access the V-Safe system.   Peter Nichols was instructed to call 911 with any severe reactions post vaccine: Marland Kitchen Difficulty breathing  . Swelling of face and throat  . A fast heartbeat  . A bad rash all over body  . Dizziness and weakness   Immunizations Administered    Name Date Dose VIS Date Route   Pfizer COVID-19 Vaccine 08/31/2020  3:12 PM 0.3 mL 08/07/2020 Intramuscular   Manufacturer: [employer], [employer]   Lot: [id number]   [place]: [id number]

## 2020-10-02 DIAGNOSIS — E785 Hyperlipidemia, unspecified: Secondary | ICD-10-CM | POA: Diagnosis not present

## 2020-10-02 DIAGNOSIS — F1020 Alcohol dependence, uncomplicated: Secondary | ICD-10-CM | POA: Diagnosis not present

## 2020-10-02 DIAGNOSIS — Z23 Encounter for immunization: Secondary | ICD-10-CM | POA: Diagnosis not present

## 2020-10-02 DIAGNOSIS — D696 Thrombocytopenia, unspecified: Secondary | ICD-10-CM | POA: Diagnosis not present

## 2020-10-02 DIAGNOSIS — I10 Essential (primary) hypertension: Secondary | ICD-10-CM | POA: Diagnosis not present

## 2020-10-02 DIAGNOSIS — Z0000 Encounter for general adult medical examination without abnormal findings: Secondary | ICD-10-CM | POA: Diagnosis not present

## 2020-10-02 DIAGNOSIS — R946 Abnormal results of thyroid function studies: Secondary | ICD-10-CM | POA: Diagnosis not present

## 2020-10-02 DIAGNOSIS — I890 Lymphedema, not elsewhere classified: Secondary | ICD-10-CM | POA: Diagnosis not present

## 2020-10-02 DIAGNOSIS — R600 Localized edema: Secondary | ICD-10-CM | POA: Diagnosis not present

## 2021-01-27 DIAGNOSIS — X32XXXD Exposure to sunlight, subsequent encounter: Secondary | ICD-10-CM | POA: Diagnosis not present

## 2021-01-27 DIAGNOSIS — L570 Actinic keratosis: Secondary | ICD-10-CM | POA: Diagnosis not present

## 2021-03-25 DIAGNOSIS — I890 Lymphedema, not elsewhere classified: Secondary | ICD-10-CM | POA: Diagnosis not present

## 2021-03-25 DIAGNOSIS — I10 Essential (primary) hypertension: Secondary | ICD-10-CM | POA: Diagnosis not present

## 2021-03-25 DIAGNOSIS — D696 Thrombocytopenia, unspecified: Secondary | ICD-10-CM | POA: Diagnosis not present

## 2021-03-25 DIAGNOSIS — E785 Hyperlipidemia, unspecified: Secondary | ICD-10-CM | POA: Diagnosis not present

## 2021-03-25 DIAGNOSIS — F1021 Alcohol dependence, in remission: Secondary | ICD-10-CM | POA: Diagnosis not present

## 2021-04-01 ENCOUNTER — Other Ambulatory Visit: Payer: Self-pay | Admitting: Family Medicine

## 2021-04-01 DIAGNOSIS — F1021 Alcohol dependence, in remission: Secondary | ICD-10-CM

## 2021-04-23 ENCOUNTER — Other Ambulatory Visit: Payer: Self-pay

## 2021-04-23 ENCOUNTER — Emergency Department (HOSPITAL_COMMUNITY): Payer: Medicare HMO

## 2021-04-23 ENCOUNTER — Emergency Department (HOSPITAL_COMMUNITY)
Admission: EM | Admit: 2021-04-23 | Discharge: 2021-04-24 | Disposition: A | Discharge status: Home / Self Care | Payer: Medicare HMO | Attending: Emergency Medicine | Admitting: Emergency Medicine

## 2021-04-23 DIAGNOSIS — Z79899 Other long term (current) drug therapy: Secondary | ICD-10-CM | POA: Diagnosis not present

## 2021-04-23 DIAGNOSIS — E876 Hypokalemia: Secondary | ICD-10-CM | POA: Diagnosis not present

## 2021-04-23 DIAGNOSIS — R609 Edema, unspecified: Secondary | ICD-10-CM | POA: Diagnosis not present

## 2021-04-23 DIAGNOSIS — R600 Localized edema: Secondary | ICD-10-CM | POA: Diagnosis not present

## 2021-04-23 DIAGNOSIS — K429 Umbilical hernia without obstruction or gangrene: Secondary | ICD-10-CM | POA: Diagnosis not present

## 2021-04-23 DIAGNOSIS — I10 Essential (primary) hypertension: Secondary | ICD-10-CM | POA: Insufficient documentation

## 2021-04-23 DIAGNOSIS — R17 Unspecified jaundice: Secondary | ICD-10-CM | POA: Insufficient documentation

## 2021-04-23 DIAGNOSIS — K7031 Alcoholic cirrhosis of liver with ascites: Secondary | ICD-10-CM | POA: Diagnosis not present

## 2021-04-23 DIAGNOSIS — R188 Other ascites: Secondary | ICD-10-CM

## 2021-04-23 DIAGNOSIS — K7460 Unspecified cirrhosis of liver: Secondary | ICD-10-CM | POA: Diagnosis not present

## 2021-04-23 DIAGNOSIS — K439 Ventral hernia without obstruction or gangrene: Secondary | ICD-10-CM | POA: Diagnosis not present

## 2021-04-23 LAB — CBC WITH DIFFERENTIAL/PLATELET
Abs Immature Granulocytes: 0.01 10*3/uL (ref 0.00–0.07)
Basophils Absolute: 0.1 10*3/uL (ref 0.0–0.1)
Basophils Relative: 1 %
Eosinophils Absolute: 0.3 10*3/uL (ref 0.0–0.5)
Eosinophils Relative: 4 %
HCT: 40.6 % (ref 39.0–52.0)
Hemoglobin: 14.4 g/dL (ref 13.0–17.0)
Immature Granulocytes: 0 %
Lymphocytes Relative: 35 %
Lymphs Abs: 2.4 10*3/uL (ref 0.7–4.0)
MCH: 34.4 pg — ABNORMAL HIGH (ref 26.0–34.0)
MCHC: 35.5 g/dL (ref 30.0–36.0)
MCV: 97.1 fL (ref 80.0–100.0)
Monocytes Absolute: 0.8 10*3/uL (ref 0.1–1.0)
Monocytes Relative: 12 %
Neutro Abs: 3.2 10*3/uL (ref 1.7–7.7)
Neutrophils Relative %: 48 %
Platelets: 106 10*3/uL — ABNORMAL LOW (ref 150–400)
RBC: 4.18 MIL/uL — ABNORMAL LOW (ref 4.22–5.81)
RDW: 13.3 % (ref 11.5–15.5)
WBC: 6.8 10*3/uL (ref 4.0–10.5)
nRBC: 0 % (ref 0.0–0.2)

## 2021-04-23 LAB — URINALYSIS, ROUTINE W REFLEX MICROSCOPIC
Glucose, UA: NEGATIVE mg/dL
Hgb urine dipstick: NEGATIVE
Ketones, ur: NEGATIVE mg/dL
Leukocytes,Ua: NEGATIVE
Nitrite: NEGATIVE
Protein, ur: NEGATIVE mg/dL
Specific Gravity, Urine: 1.018 (ref 1.005–1.030)
pH: 7 (ref 5.0–8.0)

## 2021-04-23 LAB — COMPREHENSIVE METABOLIC PANEL
ALT: 31 U/L (ref 0–44)
AST: 47 U/L — ABNORMAL HIGH (ref 15–41)
Albumin: 3.2 g/dL — ABNORMAL LOW (ref 3.5–5.0)
Alkaline Phosphatase: 74 U/L (ref 38–126)
Anion gap: 10 (ref 5–15)
BUN: 19 mg/dL (ref 8–23)
CO2: 28 mmol/L (ref 22–32)
Calcium: 9.2 mg/dL (ref 8.9–10.3)
Chloride: 98 mmol/L (ref 98–111)
Creatinine, Ser: 1.22 mg/dL (ref 0.61–1.24)
GFR, Estimated: >60 mL/min (ref >60)
Glucose, Bld: 107 mg/dL — ABNORMAL HIGH (ref 70–99)
Potassium: 3.2 mmol/L — ABNORMAL LOW (ref 3.5–5.1)
Sodium: 136 mmol/L (ref 135–145)
Total Bilirubin: 6.4 mg/dL — ABNORMAL HIGH (ref 0.3–1.2)
Total Protein: 6.4 g/dL — ABNORMAL LOW (ref 6.5–8.1)

## 2021-04-23 LAB — LIPASE, BLOOD: Lipase: 63 U/L — ABNORMAL HIGH (ref 11–51)

## 2021-04-23 MED ORDER — IOHEXOL 300 MG/ML  SOLN
100.0000 mL | Freq: Once | INTRAMUSCULAR | Status: AC | PRN
  Administered 2021-04-23: 100 mL via INTRAVENOUS

## 2021-04-23 NOTE — ED Triage Notes (Signed)
C/O mild epigastric pain along with concern for jaundice and icterus

## 2021-04-23 NOTE — ED Provider Notes (Signed)
Emergency Medicine Provider Triage Evaluation Note  Peter Nichols , a 75 y.o. male  was evaluated in triage.  Pt complains of intermittent abdominal pain over the last months.  Patient reports pain is to epigastric area no radiation of pain.  Patient denies any fevers, chills, nausea, vomiting, diarrhea, constipation, blood in stool, melena.  Patient reports that he does drink alcohol on a regular basis.  Patient also has primary care provider is concerned for his liver and scheduled a ultrasound.  Review of Systems  Positive: Abd pain Negative: fevers, chills, nausea, vomiting, diarrhea, constipation, blood in stool, melena  Physical Exam  BP (!) 144/71   Pulse (!) 59   Temp 97.7 F (36.5 C)   Resp 17   Ht 5' 10"  (1.778 m)   Wt 90.3 kg   SpO2 98%   BMI 28.55 kg/m  Gen:   Awake, no distress   Resp:  Normal effort  MSK:   Moves extremities without difficulty  Other:  Abdomen is protuberant, soft, distended, tenderness to epigastric area.  No guarding or rebound tenderness.  Medical Decision Making  Medically screening exam initiated at 8:53 PM.  Appropriate orders placed.  Princess Bruins Standley was informed that the remainder of the evaluation will be completed by another provider, this initial triage assessment does not replace that evaluation, and the importance of remaining in the ED until their evaluation is complete.  The patient appears stable so that the remainder of the work up may be completed by another provider.      Loni Beckwith, PA-C 04/23/21 2054    Lorelle Gibbs, DO 04/23/21 2228

## 2021-04-24 NOTE — ED Provider Notes (Signed)
[facility] EMERGENCY DEPARTMENT Provider Note   CSN: [id number] Arrival date & time: 04/23/21  1857     History Chief Complaint  Patient presents with   Abdominal Pain   Jaundice    Peter Nichols is a 75 y.o. male. Patient arrived to the emergency department, yesterday, at 5:57 PM.  He presented for evaluation of abdominal pain with concern for jaundice.  He had screening evaluation performed, and laboratory testing was sent.  Our last evening because he was feeling weak and tired.  He had scheduled an appointment with his PCP, today but because of possible acute symptoms he decided to come here for evaluation.  The patient states he became concerned about his liver because one of his family members looked at his abdomen and became concerned for a liver problem, he was therefore referred to his PCP.  He states that his PCP checked his labs about a month ago and his bilirubin was elevated.  He denies recent fever, chills, vomiting, dizziness, cough, chest pain, change in weight.  He has chronic lower extremity edema.   HPI Patient in room ready for evaluation at this time.    Past Medical History:  Diagnosis Date   Dysphagia    Hyperlipidemia    Hypertension    Neuromuscular disorder ([place])    tremor   Steatohepatitis, non-alcoholic    Thrombocytopenia ([place])     Patient Active Problem List   Diagnosis Date Noted   Thrombocytopenia ([place]) 05/04/2017    Past Surgical History:  Procedure Laterality Date   CHOLECYSTECTOMY         No family history on file.  Social History   Tobacco Use   Smoking status: Never   Smokeless tobacco: Never  Vaping Use   Vaping Use: Never used  Substance Use Topics   Alcohol use: Yes   Drug use: Never    Home Medications Prior to Admission medications   Medication Sig Start Date End Date Taking? Authorizing Provider  amLODipine (NORVASC) 5 MG tablet Take 5 mg by mouth daily.  08/31/19   [provider]  benazepril (LOTENSIN) 20 MG tablet Take 20 mg by mouth daily.  08/31/19   [provider]  chlorthalidone (HYGROTON) 25 MG tablet Take 25 mg by mouth daily.  08/31/19   [provider]  furosemide (LASIX) 40 MG tablet Take 40 mg by mouth daily as needed. 04/17/21   [provider]  Potassium Chloride ER 20 MEQ TBCR Take 1 tablet by mouth daily. 04/17/21   [provider]  pravastatin (PRAVACHOL) 40 MG tablet Take 40 mg by mouth daily.  08/31/19   [provider]  propranolol ER (INDERAL LA) 60 MG 24 hr capsule Take 60 mg by mouth daily.  08/31/19   [provider]    Allergies    Patient has no known allergies.  Review of Systems   Review of Systems  All other systems reviewed and are negative.  Physical Exam Updated Vital Signs BP 129/61   Pulse (!) 59   Temp 98.7 F (37.1 C) (Oral)   Resp (!) 21   Ht 5' 10"  (1.778 m)   Wt 90.3 kg   SpO2 100%   BMI 28.55 kg/m   Physical Exam Vitals and nursing note reviewed.  Constitutional:      General: He is not in acute distress.    Appearance: He is well-developed. He is not ill-appearing, toxic-appearing or diaphoretic.  HENT:  Head: Normocephalic and atraumatic.     Right Ear: External ear normal.     Left Ear: External ear normal.  Eyes:     Conjunctiva/sclera: Conjunctivae normal.     Pupils: Pupils are equal, round, and reactive to light.  Neck:     Trachea: Phonation normal.  Cardiovascular:     Rate and Rhythm: Normal rate and regular rhythm.     Heart sounds: Normal heart sounds.  Pulmonary:     Effort: Pulmonary effort is normal.     Breath sounds: Normal breath sounds.  Abdominal:     General: There is distension.     Palpations: Abdomen is soft.     Tenderness: There is no abdominal tenderness.     Comments: No convincing clinical evidence of ascites  Genitourinary:    Testes:        Right: Swelling present.        Left: Swelling present.   Musculoskeletal:        General: Normal range of motion.     Cervical back: Normal range of motion and neck supple.  Skin:    General: Skin is warm and dry.  Neurological:     Mental Status: He is alert and oriented to person, place, and time.     Cranial Nerves: No cranial nerve deficit.     Sensory: No sensory deficit.     Motor: No abnormal muscle tone.     Coordination: Coordination normal.  Psychiatric:        Mood and Affect: Mood normal.        Behavior: Behavior normal.        Thought Content: Thought content normal.        Judgment: Judgment normal.    ED Results / Procedures / Treatments   Labs (all labs ordered are listed, but only abnormal results are displayed) Labs Reviewed  COMPREHENSIVE METABOLIC PANEL - Abnormal; Notable for the following components:      Result Value   Potassium 3.2 (*)    Glucose, Bld 107 (*)    Total Protein 6.4 (*)    Albumin 3.2 (*)    AST 47 (*)    Total Bilirubin 6.4 (*)    All other components within normal limits  LIPASE, BLOOD - Abnormal; Notable for the following components:   Lipase 63 (*)    All other components within normal limits  CBC WITH DIFFERENTIAL/PLATELET - Abnormal; Notable for the following components:   RBC 4.18 (*)    MCH 34.4 (*)    Platelets 106 (*)    All other components within normal limits  URINALYSIS, ROUTINE W REFLEX MICROSCOPIC - Abnormal; Notable for the following components:   Color, Urine AMBER (*)    Bilirubin Urine SMALL (*)    All other components within normal limits    EKG None  Radiology CT ABDOMEN PELVIS W CONTRAST  Result Date: 04/23/2021 CLINICAL DATA:  Epigastric pain.  Jaundice. EXAM: CT ABDOMEN AND PELVIS WITH CONTRAST TECHNIQUE: Multidetector CT imaging of the abdomen and pelvis was performed using the standard protocol following bolus administration of intravenous contrast. CONTRAST:  111m OMNIPAQUE IOHEXOL 300 MG/ML  SOLN COMPARISON:  Remote CT 11/27/2007.  Abdominal ultrasound  05/10/2017 FINDINGS: Lower chest: Small fluid density structure abutting the right heart border is likely a pericardial cyst, this is stable from 2009 exam. Trace left pleural effusion. Punctate calcified densities in the left lower lobe. Hepatobiliary: Cirrhotic hepatic morphology with nodular contours. Dome of the liver  is excluded on initial imaging, but included on delayed phase. There are scattered low-density lesions throughout the liver, largest measuring 13 mm, series 8, image 21. Remainder of these lesions are subcentimeter. Cholecystectomy. No common bile duct dilatation. No portal vein thrombus. Recannulated umbilical vein. Pancreas: No ductal dilatation or inflammation. No evidence of pancreatic mass. Spleen: Splenomegaly with spleen measuring 15.6 x 8.4 x 12.4 cm (volume = 850 cm^3). No focal splenic abnormality. Slight heterogeneous splenic parenchyma. Adrenals/Urinary Tract: No adrenal nodule. No hydronephrosis or renal calculi. Homogeneous renal enhancement. Diminished excretion on delayed phase imaging. No focal renal abnormality. Partially distended urinary bladder. Stomach/Bowel: Bowel assessment is limited in the absence of enteric contrast and presence of ascites. Tiny hiatal hernia. Questionable perigastric varices. There are multiple duodenal diverticulum spur. No small bowel obstruction. Enteric chain sutures noted in mid abdominal small bowel, adjacent small bowel is mildly patulous. Normal appendix. Moderate stool burden throughout the colon. Mild colonic redundancy. Sigmoid diverticulosis without diverticulitis. Vascular/Lymphatic: Aortic atherosclerosis. No aortic aneurysm. Patent portal vein. Patent splenic vein. There are portosystemic collaterals in the left and anterior abdomen. Recannulated umbilical vein. There are small periportal and retroperitoneal lymph nodes are not enlarged by size criteria. Reproductive: Prostate is unremarkable. Other: Moderate volume abdominopelvic ascites.  Generalized mesenteric and subcutaneous edema. Moderate-sized left lateral lower abdominal wall hernia through postsurgical defect of the left iliac bone. This hernia contains fat, ascites and varices. Small fat containing umbilical hernia containing ascites. Small supraumbilical ventral abdominal wall hernia containing varicosities. No free air. Musculoskeletal: Postsurgical defect in the left iliac bone. Scoliosis and degenerative change in the spine. IMPRESSION: 1. Hepatic cirrhosis with portal hypertension, splenomegaly, and portosystemic collaterals. Moderate volume abdominopelvic ascites. 2. Scattered low-density lesions throughout the liver, largest measuring 13 mm. These are nonspecific in the setting of cirrhosis, and were not seen on prior exams. Recommend further assessment with hepatic protocol MRI when patient is able to tolerate breath hold technique. 3. Cholecystectomy without biliary dilatation. 4. Moderate-sized left lateral lower abdominal wall hernia through postsurgical defect of the left iliac bone. This hernia contains fat, ascites and varices. Small fat containing umbilical and supraumbilical ventral abdominal wall hernias containing varicosities. 5. Colonic diverticulosis without diverticulitis. 6. Diminished renal excretion on delayed phase imaging can be seen with renal dysfunction. Aortic Atherosclerosis (ICD10-I70.0). Electronically Signed   By: Keith Rake M.D.   On: 04/23/2021 23:43    Procedures .Critical Care  Date/Time: 04/24/2021 1:08 PM Performed by: Daleen Bo, MD Authorized by: Daleen Bo, MD   Critical care provider statement:    Critical care time (minutes):  35   Critical care start time:  04/24/2021 11:00 AM   Critical care end time:  04/24/2021 1:09 PM   Critical care time was exclusive of:  Separately billable procedures and treating other patients   Critical care was necessary to treat or prevent imminent or life-threatening deterioration of the  following conditions:  Metabolic crisis   Critical care was time spent personally by me on the following activities:  Blood draw for specimens, development of treatment plan with patient or surrogate, discussions with consultants, evaluation of patient's response to treatment, examination of patient, obtaining history from patient or surrogate, ordering and performing treatments and interventions, ordering and review of laboratory studies, pulse oximetry, re-evaluation of patient's condition, review of old charts and ordering and review of radiographic studies   Medications Ordered in ED Medications  iohexol (OMNIPAQUE) 300 MG/ML solution 100 mL (100 mLs Intravenous Contrast Given 04/23/21 2301)  ED Course  I have reviewed the triage vital signs and the nursing notes.  Pertinent labs & imaging results that were available during my care of the patient were reviewed by me and considered in my medical decision making (see chart for details).    MDM Rules/Calculators/A&P                           Patient Vitals for the past 24 hrs:  BP Temp Temp src Pulse Resp SpO2 Height Weight  04/24/21 1300 129/61 -- -- (!) 59 (!) 21 100 % -- --  04/24/21 1200 (!) 115/50 -- -- (!) 53 14 98 % -- --  04/24/21 1100 (!) 122/54 -- -- (!) 54 13 99 % -- --  04/24/21 0835 (!) 142/100 98.7 F (37.1 C) Oral 88 17 96 % -- --  04/24/21 0832 128/62 98.1 F (36.7 C) Oral (!) 58 14 97 % -- --  04/24/21 0545 128/60 98.4 F (36.9 C) Oral (!) 56 15 100 % -- --  04/24/21 0306 138/69 98.2 F (36.8 C) Oral 70 17 97 % -- --  04/24/21 0007 136/66 98.4 F (36.9 C) Oral 75 16 96 % -- --  04/23/21 2051 -- -- -- -- -- -- 5' 10"  (1.778 m) 90.3 kg  04/23/21 2046 (!) 144/71 97.7 F (36.5 C) -- (!) 59 17 98 % -- --    1:09 PM Reevaluation with update and discussion. After initial assessment and treatment, an updated evaluation reveals he is alert and cooperative.  No change in clinical status, findings discussed with patient  and wife all questions were answered. Daleen Bo   Medical Decision Making:  This patient is presenting for evaluation of abdominal swelling and jaundice, which does require a range of treatment options, and is a complaint that involves a moderate risk of morbidity and mortality. The differential diagnoses include progressive liver disorder, hepatic encephalopathy, metabolic disorder, acute illness. I decided to review old records, and in summary he has been managed as an outpatient by his PCP for mild elevation.  I obtained additional historical information from wife at bedside.  Clinical Laboratory Tests Ordered, included CBC, Metabolic panel, and Urinalysis.  Lipase.  Review indicates normal except platelets slightly low, potassium low, glucose high, lipase. Radiologic Tests Ordered, included CT abdomen pelvis.  I independently Visualized: Radiograph images, which show findings consistent with cirrhosis including abnormal appearing liver and ascites.  Incidental left iliac bone hernia, and diverticulosis, uncomplicated   Critical Interventions-clinical evaluation, laboratory testing, radiography, observation and reassessment.  Discussion of findings and plan with PCP.  PCP plans on referring the patient to gastroenterology, and understand the need for MRI of the liver.  After These Interventions, the Patient was reevaluated and was found stable for discharge.  Patient with apparently ongoing elevated bilirubin, and abdominal swelling, likely from ascites.  He has stable chronic edema of the lower extremities.  Incidental finding of hypokalemia, can be treated as an outpatient with increasing dose of potassium.  He was started on furosemide and potassium, on 04/17/2021.  His PCP will arrange for follow-up care and treatment of this disorder.  CRITICAL CARE-yes Performed by: Daleen Bo  Nursing Notes Reviewed/ Care Coordinated Applicable Imaging Reviewed Interpretation of Laboratory Data  incorporated into ED treatment  The patient appears reasonably screened and/or stabilized for discharge and I doubt any other medical condition or other [facility] requiring further screening, evaluation, or treatment in the ED at this time  prior to discharge.  Plan: Home Medications-continue usual, and increase potassium to twice daily for 3 days then resume daily.; Home Treatments-increase potassium in diet; return here if the recommended treatment, does not improve the symptoms; Recommended follow up-PCP follow-up for further care and treatment within a week.     Final Clinical Impression(s) / ED Diagnoses Final diagnoses:  Hyperbilirubinemia  Hypokalemia  Peripheral edema  Other ascites  Cirrhosis of liver with ascites, unspecified hepatic cirrhosis type [facility])    Rx / DC Orders ED Discharge Orders     None        Daleen Bo, MD 04/24/21 1309

## 2021-04-24 NOTE — Discharge Instructions (Addendum)
The testing today indicates that you have some cirrhosis with fluid in your abdomen called ascites.  This likely explains the elevated bilirubin level.  Your potassium level was low.  Continue taking your currently prescribed medications.  Also increase your potassium to twice a day for 3 days beginning today.  After that resume the daily potassium dosing.  Dr. Dema Severin will contact you to arrange for further care and treatment.  She plans on referring you to a gastroenterologist, and I have discussed the need for you to receive an MRI of your liver for further assessment.  If you do not hear from her or the office, give them a call in the next couple of days.

## 2021-04-24 NOTE — ED Notes (Signed)
ED Provider at bedside. 

## 2021-04-28 ENCOUNTER — Other Ambulatory Visit: Payer: Medicare HMO

## 2021-04-29 DIAGNOSIS — E876 Hypokalemia: Secondary | ICD-10-CM | POA: Diagnosis not present

## 2021-04-29 DIAGNOSIS — K7031 Alcoholic cirrhosis of liver with ascites: Secondary | ICD-10-CM | POA: Diagnosis not present

## 2021-05-01 DIAGNOSIS — E785 Hyperlipidemia, unspecified: Secondary | ICD-10-CM | POA: Diagnosis not present

## 2021-05-01 DIAGNOSIS — R1013 Epigastric pain: Secondary | ICD-10-CM | POA: Diagnosis not present

## 2021-05-01 DIAGNOSIS — K7031 Alcoholic cirrhosis of liver with ascites: Secondary | ICD-10-CM | POA: Diagnosis not present

## 2021-05-01 DIAGNOSIS — K769 Liver disease, unspecified: Secondary | ICD-10-CM | POA: Diagnosis not present

## 2021-05-01 DIAGNOSIS — I10 Essential (primary) hypertension: Secondary | ICD-10-CM | POA: Diagnosis not present

## 2021-05-06 ENCOUNTER — Other Ambulatory Visit: Payer: Self-pay | Admitting: Family Medicine

## 2021-05-06 DIAGNOSIS — K769 Liver disease, unspecified: Secondary | ICD-10-CM

## 2021-05-06 DIAGNOSIS — K7031 Alcoholic cirrhosis of liver with ascites: Secondary | ICD-10-CM

## 2021-05-15 DIAGNOSIS — K769 Liver disease, unspecified: Secondary | ICD-10-CM | POA: Diagnosis not present

## 2021-05-15 DIAGNOSIS — K7031 Alcoholic cirrhosis of liver with ascites: Secondary | ICD-10-CM | POA: Diagnosis not present

## 2021-05-15 DIAGNOSIS — R1013 Epigastric pain: Secondary | ICD-10-CM | POA: Diagnosis not present

## 2021-05-15 DIAGNOSIS — F1020 Alcohol dependence, uncomplicated: Secondary | ICD-10-CM | POA: Diagnosis not present

## 2021-05-17 ENCOUNTER — Ambulatory Visit
Admission: RE | Admit: 2021-05-17 | Discharge: 2021-05-17 | Disposition: A | Discharge status: Home / Self Care | Payer: Medicare HMO | Source: Ambulatory Visit | Attending: Family Medicine | Admitting: Family Medicine

## 2021-05-17 ENCOUNTER — Other Ambulatory Visit: Payer: Self-pay

## 2021-05-17 DIAGNOSIS — K7460 Unspecified cirrhosis of liver: Secondary | ICD-10-CM | POA: Diagnosis not present

## 2021-05-17 DIAGNOSIS — K7031 Alcoholic cirrhosis of liver with ascites: Secondary | ICD-10-CM

## 2021-05-17 DIAGNOSIS — K7689 Other specified diseases of liver: Secondary | ICD-10-CM | POA: Diagnosis not present

## 2021-05-17 DIAGNOSIS — R110 Nausea: Secondary | ICD-10-CM | POA: Diagnosis not present

## 2021-05-17 DIAGNOSIS — R188 Other ascites: Secondary | ICD-10-CM | POA: Diagnosis not present

## 2021-05-17 DIAGNOSIS — K769 Liver disease, unspecified: Secondary | ICD-10-CM

## 2021-05-17 MED ORDER — GADOBENATE DIMEGLUMINE 529 MG/ML IV SOLN
15.0000 mL | Freq: Once | INTRAVENOUS | Status: AC | PRN
  Administered 2021-05-17: 15 mL via INTRAVENOUS

## 2021-05-22 ENCOUNTER — Inpatient Hospital Stay (HOSPITAL_COMMUNITY)
Admission: EM | Admit: 2021-05-22 | Discharge: 2021-05-24 | DRG: 434 | Disposition: A | Discharge status: Home / Self Care | Payer: Medicare HMO | Attending: Internal Medicine | Admitting: Internal Medicine

## 2021-05-22 ENCOUNTER — Emergency Department (HOSPITAL_COMMUNITY): Payer: Medicare HMO

## 2021-05-22 ENCOUNTER — Other Ambulatory Visit: Payer: Self-pay

## 2021-05-22 ENCOUNTER — Encounter (HOSPITAL_COMMUNITY): Payer: Self-pay | Admitting: *Deleted

## 2021-05-22 ENCOUNTER — Inpatient Hospital Stay (HOSPITAL_COMMUNITY): Payer: Medicare HMO

## 2021-05-22 DIAGNOSIS — Z79899 Other long term (current) drug therapy: Secondary | ICD-10-CM

## 2021-05-22 DIAGNOSIS — D6489 Other specified anemias: Secondary | ICD-10-CM | POA: Diagnosis not present

## 2021-05-22 DIAGNOSIS — E663 Overweight: Secondary | ICD-10-CM | POA: Diagnosis not present

## 2021-05-22 DIAGNOSIS — R9082 White matter disease, unspecified: Secondary | ICD-10-CM | POA: Diagnosis not present

## 2021-05-22 DIAGNOSIS — K7581 Nonalcoholic steatohepatitis (NASH): Secondary | ICD-10-CM | POA: Diagnosis not present

## 2021-05-22 DIAGNOSIS — I10 Essential (primary) hypertension: Secondary | ICD-10-CM | POA: Diagnosis present

## 2021-05-22 DIAGNOSIS — K5900 Constipation, unspecified: Secondary | ICD-10-CM | POA: Diagnosis present

## 2021-05-22 DIAGNOSIS — D6959 Other secondary thrombocytopenia: Secondary | ICD-10-CM | POA: Diagnosis present

## 2021-05-22 DIAGNOSIS — I672 Cerebral atherosclerosis: Secondary | ICD-10-CM | POA: Diagnosis not present

## 2021-05-22 DIAGNOSIS — Z9049 Acquired absence of other specified parts of digestive tract: Secondary | ICD-10-CM | POA: Diagnosis not present

## 2021-05-22 DIAGNOSIS — G9341 Metabolic encephalopathy: Secondary | ICD-10-CM | POA: Diagnosis not present

## 2021-05-22 DIAGNOSIS — K7200 Acute and subacute hepatic failure without coma: Secondary | ICD-10-CM | POA: Diagnosis not present

## 2021-05-22 DIAGNOSIS — E785 Hyperlipidemia, unspecified: Secondary | ICD-10-CM | POA: Diagnosis present

## 2021-05-22 DIAGNOSIS — K7290 Hepatic failure, unspecified without coma: Principal | ICD-10-CM

## 2021-05-22 DIAGNOSIS — Z6826 Body mass index (BMI) 26.0-26.9, adult: Secondary | ICD-10-CM | POA: Diagnosis not present

## 2021-05-22 DIAGNOSIS — J329 Chronic sinusitis, unspecified: Secondary | ICD-10-CM | POA: Diagnosis not present

## 2021-05-22 DIAGNOSIS — R188 Other ascites: Secondary | ICD-10-CM | POA: Diagnosis not present

## 2021-05-22 DIAGNOSIS — K7040 Alcoholic hepatic failure without coma: Secondary | ICD-10-CM | POA: Diagnosis not present

## 2021-05-22 DIAGNOSIS — R7989 Other specified abnormal findings of blood chemistry: Secondary | ICD-10-CM | POA: Diagnosis not present

## 2021-05-22 DIAGNOSIS — R748 Abnormal levels of other serum enzymes: Secondary | ICD-10-CM | POA: Diagnosis not present

## 2021-05-22 DIAGNOSIS — K7031 Alcoholic cirrhosis of liver with ascites: Secondary | ICD-10-CM | POA: Diagnosis present

## 2021-05-22 DIAGNOSIS — D649 Anemia, unspecified: Secondary | ICD-10-CM | POA: Diagnosis not present

## 2021-05-22 DIAGNOSIS — Z20822 Contact with and (suspected) exposure to covid-19: Secondary | ICD-10-CM | POA: Diagnosis not present

## 2021-05-22 DIAGNOSIS — J341 Cyst and mucocele of nose and nasal sinus: Secondary | ICD-10-CM | POA: Diagnosis not present

## 2021-05-22 DIAGNOSIS — R410 Disorientation, unspecified: Secondary | ICD-10-CM | POA: Diagnosis not present

## 2021-05-22 DIAGNOSIS — D518 Other vitamin B12 deficiency anemias: Secondary | ICD-10-CM | POA: Diagnosis not present

## 2021-05-22 DIAGNOSIS — R112 Nausea with vomiting, unspecified: Secondary | ICD-10-CM | POA: Diagnosis not present

## 2021-05-22 DIAGNOSIS — K7682 Hepatic encephalopathy: Secondary | ICD-10-CM | POA: Diagnosis present

## 2021-05-22 LAB — COMPREHENSIVE METABOLIC PANEL
ALT: 47 U/L — ABNORMAL HIGH (ref 0–44)
AST: 59 U/L — ABNORMAL HIGH (ref 15–41)
Albumin: 3.1 g/dL — ABNORMAL LOW (ref 3.5–5.0)
Alkaline Phosphatase: 66 U/L (ref 38–126)
Anion gap: 7 (ref 5–15)
BUN: 20 mg/dL (ref 8–23)
CO2: 23 mmol/L (ref 22–32)
Calcium: 9 mg/dL (ref 8.9–10.3)
Chloride: 105 mmol/L (ref 98–111)
Creatinine, Ser: 1.04 mg/dL (ref 0.61–1.24)
GFR, Estimated: >60 mL/min (ref >60)
Glucose, Bld: 133 mg/dL — ABNORMAL HIGH (ref 70–99)
Potassium: 5 mmol/L (ref 3.5–5.1)
Sodium: 135 mmol/L (ref 135–145)
Total Bilirubin: 4 mg/dL — ABNORMAL HIGH (ref 0.3–1.2)
Total Protein: 6.2 g/dL — ABNORMAL LOW (ref 6.5–8.1)

## 2021-05-22 LAB — CBC
HCT: 34.5 % — ABNORMAL LOW (ref 39.0–52.0)
Hemoglobin: 11.9 g/dL — ABNORMAL LOW (ref 13.0–17.0)
MCH: 34.5 pg — ABNORMAL HIGH (ref 26.0–34.0)
MCHC: 34.5 g/dL (ref 30.0–36.0)
MCV: 100 fL (ref 80.0–100.0)
Platelets: 123 10*3/uL — ABNORMAL LOW (ref 150–400)
RBC: 3.45 MIL/uL — ABNORMAL LOW (ref 4.22–5.81)
RDW: 13.8 % (ref 11.5–15.5)
WBC: 6.6 10*3/uL (ref 4.0–10.5)
nRBC: 0 % (ref 0.0–0.2)

## 2021-05-22 LAB — BODY FLUID CELL COUNT WITH DIFFERENTIAL
Lymphs, Fluid: 58 %
Monocyte-Macrophage-Serous Fluid: 19 % — ABNORMAL LOW (ref 50–90)
Neutrophil Count, Fluid: 23 % (ref 0–25)
Total Nucleated Cell Count, Fluid: 241 cu mm (ref 0–1000)

## 2021-05-22 LAB — RESP PANEL BY RT-PCR (FLU A&B, COVID) ARPGX2
Influenza A by PCR: NEGATIVE
Influenza B by PCR: NEGATIVE
SARS Coronavirus 2 by RT PCR: NEGATIVE

## 2021-05-22 LAB — LACTIC ACID, PLASMA: Lactic Acid, Venous: 2.2 mmol/L (ref 0.5–1.9)

## 2021-05-22 LAB — AMMONIA: Ammonia: 136 umol/L — ABNORMAL HIGH (ref 9–35)

## 2021-05-22 MED ORDER — IBUPROFEN 200 MG PO TABS: 400.0000 mg | ORAL_TABLET | Freq: Four times a day (QID) | ORAL | Status: DC | PRN

## 2021-05-22 MED ORDER — ONDANSETRON HCL 4 MG PO TABS: 4.0000 mg | ORAL_TABLET | Freq: Four times a day (QID) | ORAL | Status: DC | PRN

## 2021-05-22 MED ORDER — PRAVASTATIN SODIUM 40 MG PO TABS
40.0000 mg | ORAL_TABLET | Freq: Every day | ORAL | Status: DC
  Administered 2021-05-23 – 2021-05-24 (×2): 40 mg via ORAL
  Filled 2021-05-22 (×2): qty 1

## 2021-05-22 MED ORDER — ONDANSETRON HCL 4 MG/2ML IJ SOLN: 4.0000 mg | Freq: Four times a day (QID) | INTRAMUSCULAR | Status: DC | PRN

## 2021-05-22 MED ORDER — LIDOCAINE HCL 1 % IJ SOLN
INTRAMUSCULAR | Status: AC
  Filled 2021-05-22: qty 20

## 2021-05-22 MED ORDER — AMLODIPINE BESYLATE 5 MG PO TABS
5.0000 mg | ORAL_TABLET | Freq: Every day | ORAL | Status: DC
  Administered 2021-05-23 – 2021-05-24 (×2): 5 mg via ORAL
  Filled 2021-05-22 (×2): qty 1

## 2021-05-22 MED ORDER — LACTULOSE 10 GM/15ML PO SOLN
20.0000 g | Freq: Once | ORAL | Status: AC
  Administered 2021-05-22: 20 g via ORAL
  Filled 2021-05-22: qty 30

## 2021-05-22 MED ORDER — RIFAXIMIN 550 MG PO TABS
550.0000 mg | ORAL_TABLET | Freq: Two times a day (BID) | ORAL | Status: DC
  Administered 2021-05-22 – 2021-05-24 (×4): 550 mg via ORAL
  Filled 2021-05-22 (×5): qty 1

## 2021-05-22 MED ORDER — FUROSEMIDE 40 MG PO TABS
40.0000 mg | ORAL_TABLET | Freq: Every day | ORAL | Status: DC
  Administered 2021-05-23 – 2021-05-24 (×2): 40 mg via ORAL
  Filled 2021-05-22 (×2): qty 1

## 2021-05-22 MED ORDER — SODIUM CHLORIDE 0.9 % IV SOLN
2.0000 g | INTRAVENOUS | Status: DC
  Administered 2021-05-22 – 2021-05-23 (×2): 2 g via INTRAVENOUS
  Filled 2021-05-22: qty 2
  Filled 2021-05-22 (×2): qty 20

## 2021-05-22 MED ORDER — LACTULOSE 10 GM/15ML PO SOLN
30.0000 g | Freq: Three times a day (TID) | ORAL | Status: DC
  Administered 2021-05-22 – 2021-05-24 (×5): 30 g via ORAL
  Filled 2021-05-22 (×5): qty 60

## 2021-05-22 NOTE — ED Triage Notes (Addendum)
Wife states pt "is just out of it" lethargic and has been vomiting. GI sent him here for evaluation. Abd distended

## 2021-05-22 NOTE — ED Provider Notes (Signed)
[facility] DEPT Provider Note   CSN: [id number] Arrival date & time: 05/22/21  1329     History Chief Complaint  Patient presents with   AMS   Emesis    Peter Nichols is a 75 y.o. male.   Emesis Associated symptoms: no abdominal pain, no arthralgias, no chills, no cough, no fever and no sore throat   Patient presents for altered mental status.  History is provided by his wife.  She states that he had a long history of alcohol abuse.  He stopped drinking approximately 2 months ago.  He has been seen by his primary care doctor who referred him to GI.  He has been told that he has cirrhosis.  He has developed abdominal swelling over the past month.  He has had decline in physical and mental function over the past month as well.  Over the past 4 days, his mental acuity has further decline.  Patient requires multiple people assisting him to get into the car to go to the doctors.  He has been on Lasix and spironolactone.  His dose of spironolactone was increased 4 days ago.  He had a recent fall when he was going to take the trash out.  He has not complained of any injuries/areas of pain.  His wife does report that he has had some nausea over the past couple days.  Today, while in the car, he had to episodes of emesis.  Emesis was simply a color drink that he had had prior to the car ride.  Patient denies any nausea at this time.     Past Medical History:  Diagnosis Date   Dysphagia    Hyperlipidemia    Hypertension    Neuromuscular disorder ([place])    tremor   Steatohepatitis, non-alcoholic    Thrombocytopenia ([place])     Patient Active Problem List   Diagnosis Date Noted   Acute metabolic encephalopathy 44/10/270   Thrombocytopenia ([place]) 05/04/2017    Past Surgical History:  Procedure Laterality Date   CHOLECYSTECTOMY         No family history on file.  Social History   Tobacco Use   Smoking status: Never   Smokeless tobacco: Never   Vaping Use   Vaping Use: Never used  Substance Use Topics   Alcohol use: Yes   Drug use: Never    Home Medications Prior to Admission medications   Medication Sig Start Date End Date Taking? Authorizing Provider  amLODipine (NORVASC) 5 MG tablet Take 5 mg by mouth daily.  08/31/19  Yes [provider]  benazepril (LOTENSIN) 20 MG tablet Take 20 mg by mouth daily.  08/31/19  Yes [provider]  chlorthalidone (HYGROTON) 25 MG tablet Take 25 mg by mouth daily.  08/31/19  Yes [provider]  furosemide (LASIX) 40 MG tablet Take 40 mg by mouth daily as needed. 04/17/21  Yes [provider]  pravastatin (PRAVACHOL) 40 MG tablet Take 40 mg by mouth daily.  08/31/19  Yes [provider]    Allergies    Patient has no known allergies.  Review of Systems   Review of Systems  Constitutional:  Positive for activity change, appetite change and fatigue. Negative for chills and fever.  HENT:  Negative for ear pain and sore throat.   Eyes:  Negative for visual disturbance.  Respiratory:  Negative for cough and shortness of breath.   Cardiovascular:  Negative for chest pain.  Gastrointestinal:  Positive for  abdominal distention, nausea and vomiting. Negative for abdominal pain.  Genitourinary:  Negative for dysuria and hematuria.  Musculoskeletal:  Negative for arthralgias, back pain and neck pain.  Skin:  Negative for color change and rash.  Neurological:  Negative for seizures and syncope.  Hematological:  Bruises/bleeds easily (Known cirrhosis).  Psychiatric/Behavioral:  Positive for confusion.   All other systems reviewed and are negative.  Physical Exam Updated Vital Signs BP (!) 124/55   Pulse (!) 58   Temp 98.3 F (36.8 C) (Oral)   Resp 18   Ht 5' 10"  (1.778 m)   SpO2 99%   BMI 28.55 kg/m   Physical Exam Vitals and nursing note reviewed.  Constitutional:      General: He is not in acute distress.    Appearance: He is  well-developed. He is not toxic-appearing or diaphoretic.  HENT:     Head: Normocephalic and atraumatic.     Nose: Nose normal.     Mouth/Throat:     Mouth: Mucous membranes are moist.     Pharynx: Oropharynx is clear.  Eyes:     Conjunctiva/sclera: Conjunctivae normal.  Cardiovascular:     Rate and Rhythm: Normal rate and regular rhythm.     Heart sounds: No murmur heard. Pulmonary:     Effort: Pulmonary effort is normal. No respiratory distress.     Breath sounds: Normal breath sounds. No wheezing or rales.  Abdominal:     General: There is distension.     Palpations: Abdomen is soft.     Tenderness: There is no abdominal tenderness. There is no guarding.  Musculoskeletal:     Cervical back: Neck supple.     Right lower leg: No edema.     Left lower leg: No edema.  Skin:    General: Skin is warm and dry.     Coloration: Skin is not jaundiced or pale.  Neurological:     Mental Status: He is alert. He is disoriented.     Cranial Nerves: No cranial nerve deficit.     Sensory: No sensory deficit.     Motor: No weakness.     Comments: Oriented to person and place only    ED Results / Procedures / Treatments   Labs (all labs ordered are listed, but only abnormal results are displayed) Labs Reviewed  COMPREHENSIVE METABOLIC PANEL - Abnormal; Notable for the following components:      Result Value   Glucose, Bld 133 (*)    Total Protein 6.2 (*)    Albumin 3.1 (*)    AST 59 (*)    ALT 47 (*)    Total Bilirubin 4.0 (*)    All other components within normal limits  CBC - Abnormal; Notable for the following components:   RBC 3.45 (*)    Hemoglobin 11.9 (*)    HCT 34.5 (*)    MCH 34.5 (*)    Platelets 123 (*)    All other components within normal limits  AMMONIA - Abnormal; Notable for the following components:   Ammonia 136 (*)    All other components within normal limits  LACTIC ACID, PLASMA - Abnormal; Notable for the following components:   Lactic Acid, Venous 2.2  (*)    All other components within normal limits  RESP PANEL BY RT-PCR (FLU A&B, COVID) ARPGX2  BODY FLUID CULTURE W GRAM STAIN  URINALYSIS, COMPLETE (UACMP) WITH MICROSCOPIC  BODY FLUID CELL COUNT WITH DIFFERENTIAL  CBG MONITORING, ED    EKG  None  Radiology CT HEAD WO CONTRAST (5MM)  Result Date: 05/22/2021 CLINICAL DATA:  Cerebral hemorrhage suspected. EXAM: CT HEAD WITHOUT CONTRAST TECHNIQUE: Contiguous axial images were obtained from the base of the skull through the vertex without intravenous contrast. COMPARISON:  No pertinent prior exams available for comparison. FINDINGS: Brain: Mild generalized cerebral atrophy. Mild patchy and ill-defined hypoattenuation within the cerebral white matter, nonspecific but compatible with chronic small vessel ischemic disease. There is no acute intracranial hemorrhage. No demarcated cortical infarct. No extra-axial fluid collection. No evidence of an intracranial mass. No midline shift. Partially empty sella turcica. Vascular: No hyperdense vessel.  Atherosclerotic calcifications Skull: Normal. Negative for fracture or focal lesion. Sinuses/Orbits: Visualized orbits show no acute finding. Mild-to-moderate mucosal thickening within the inferior maxillary sinuses bilaterally. 2.1 cm right maxillary sinus mucous retention cyst more superiorly. Trace mucosal thickening within the bilateral ethmoid air cells. IMPRESSION: No evidence of acute intracranial abnormality. Mild generalized cerebral atrophy and cerebral white matter chronic small vessel ischemic disease. Paranasal sinus disease at the imaged levels, as described. Electronically Signed   By: Kellie Simmering DO   On: 05/22/2021 15:17   US Paracentesis  Result Date: 05/22/2021 INDICATION: Abdominal distention and discomfort. Ascites. Request for diagnostic and therapeutic paracentesis up to 2 L max EXAM: ULTRASOUND GUIDED RIGHT LOWER QUADRANT PARACENTESIS MEDICATIONS: 1% plain lidocaine, 5 mL COMPLICATIONS:  None immediate. PROCEDURE: Informed written consent was obtained from the patient after a discussion of the risks, benefits and alternatives to treatment. A timeout was performed prior to the initiation of the procedure. Initial ultrasound scanning demonstrates a large amount of ascites within the right lower abdominal quadrant. The right lower abdomen was prepped and draped in the usual sterile fashion. 1% lidocaine was used for local anesthesia. Following this, a 19 gauge, 7-cm, Yueh catheter was introduced. An ultrasound image was saved for documentation purposes. The paracentesis was performed. The catheter was removed and a dressing was applied. The patient tolerated the procedure well without immediate post procedural complication. FINDINGS: A total of approximately 2 L of clear yellow fluid was removed. Samples were sent to the laboratory as requested by the clinical team. IMPRESSION: Successful ultrasound-guided paracentesis yielding 2 liters of peritoneal fluid. Read by: Ascencion Dike PA-C Electronically Signed   By: Jerilynn Mages.  Shick M.D.   On: 05/22/2021 16:20    Procedures Procedures   Medications Ordered in ED Medications  lidocaine (XYLOCAINE) 1 % (with pres) injection (has no administration in time range)  lactulose (CHRONULAC) 10 GM/15ML solution 30 g (has no administration in time range)  cefTRIAXone (ROCEPHIN) 2 g in sodium chloride 0.9 % 100 mL IVPB (has no administration in time range)  lactulose (CHRONULAC) 10 GM/15ML solution 20 g (20 g Oral Given 05/22/21 1454)    ED Course  I have reviewed the triage vital signs and the nursing notes.  Pertinent labs & imaging results that were available during my care of the patient were reviewed by me and considered in my medical decision making (see chart for details).    MDM Rules/Calculators/A&P                           Patient is a 75 year old male with cirrhosis presenting for altered mental status.  He arrives in the ED with his wife,  who accompanies him at bedside.  History is provided by his wife.  She reports that he has had acute on chronic confusion and lethargy over the past  4 days.  She states that she was instructed to come into the ED by his GI provider.  Patient is able to engage in conversation.  He is disoriented and is only oriented to person and place.  Patient's wife states that, at baseline, patient is fully alert and oriented.  Patient denies any current complaints at this time.  His wife states that he did have 2 episodes of emesis today and has had nausea over the past several days.  She also reports that his abdominal distention has increased.  On exam, patient does have some mild abdominal distention consistent with ascites.  He does not have any areas of tenderness.  His abdomen is soft.  He has no evidence of acute infection.  He does have asterixis.  Patient's presentation is consistent with hepatic encephalopathy.  Labs, including ammonia were ordered.  His wife does describe an episode of a fall that occurred while he was taking out the garbage within the past few days.  Given his cirrhosis, patient likely coagulopathic.  This would put him at increased risk for intracranial bleeding.  CT scan of head was ordered to rule this out.  On-call GI provider came to the ED to evaluate the patient.  He agrees with hepatic encephalopathy as diagnosis.  He agrees with initiating lactulose therapy.  He did request ordering an ultrasound-guided paracentesis, which was ordered.  CT scan negative for acute intracranial hemorrhage.  Patient was admitted to hospitalist service for further management.  Final Clinical Impression(s) / ED Diagnoses Final diagnoses:  Ascites  Hepatic encephalopathy [facility])    Rx / DC Orders ED Discharge Orders     None        Godfrey Pick, MD 05/22/21 1753

## 2021-05-22 NOTE — ED Notes (Addendum)
Called the 4th floor to check if they were ready for pt. Floor waiting on transport to supply a bed for the receiving room. Floor said they will secure message nurse when bed is in room.

## 2021-05-22 NOTE — H&P (Signed)
History and Physical    Peter Nichols HKV:[id number] DOB: [birth year] DOA: 05/22/2021  PCP: [name], MD  Patient coming from: Home  Chief Complaint: altered mental status  HPI: Peter Nichols is a 75 y.o. male with medical history significant of HTN, HLD, EtOH abuse w/ cirrhosis. Presenting with confusion and lethargy. History per wife. Patient has been generally confused for at least a week. He has had difficulty understanding and following simple commands with his wife. He seems weaker to her and has more difficulty with ambulation. Over the last couple of days his symptoms have progressed to include lethargy, N/V. He went to his PCP to get blood work this morning and seemed even more confused. It was recommended that he come to the ED for evaluation.    ED Course: He was found to be encephalopathic. He had an elevated ammonia level. [facility] evaluated. He had a paracentesis performed. He was started on lactulose. [facility] was called for admission.   Review of Systems:  Review of systems is otherwise negative for all not mentioned in HPI.   PMHx Past Medical History:  Diagnosis Date   Dysphagia    Hyperlipidemia    Hypertension    Neuromuscular disorder ([place])    tremor   Steatohepatitis, non-alcoholic    Thrombocytopenia ([facility])     PSHx Past Surgical History:  Procedure Laterality Date   CHOLECYSTECTOMY      SocHx  reports that he has never smoked. He has never used smokeless tobacco. He reports current alcohol use. He reports that he does not use drugs.  No Known Allergies  FamHx No family history on file.  Prior to Admission medications   Medication Sig Start Date End Date Taking? Authorizing Provider  amLODipine (NORVASC) 5 MG tablet Take 5 mg by mouth daily.  08/31/19   [provider]  benazepril (LOTENSIN) 20 MG tablet Take 20 mg by mouth daily.  08/31/19   [provider]  chlorthalidone (HYGROTON) 25 MG tablet Take 25 mg by mouth daily.   08/31/19   [provider]  furosemide (LASIX) 40 MG tablet Take 40 mg by mouth daily as needed. 04/17/21   [provider]  pantoprazole (PROTONIX) 40 MG tablet Take 40 mg by mouth daily. 05/15/21   [provider]  potassium chloride SA (KLOR-CON) 20 MEQ tablet Take 20 mEq by mouth daily. 05/02/21   [provider]  pravastatin (PRAVACHOL) 40 MG tablet Take 40 mg by mouth daily.  08/31/19   [provider]  propranolol ER (INDERAL LA) 60 MG 24 hr capsule Take 60 mg by mouth daily.  08/31/19   [provider]  spironolactone (ALDACTONE) 100 MG tablet Take 100 mg by mouth daily. 05/15/21   [provider]    Physical Exam: Vitals:   05/22/21 1336 05/22/21 1339 05/22/21 1500  BP: 110/76  (!) 122/58  Pulse: (!) 54  (!) 54  Resp: 16  14  Temp: 98.3 F (36.8 C)    TempSrc: Oral    SpO2: 100%  99%  Height:  5' 10"  (1.778 m)     General: 75 y.o. male resting in bed in NAD Eyes: PERRL, icteric sclera ENMT: Nares patent w/o discharge, orophaynx clear, dentition normal, ears w/o discharge/lesions/ulcers Neck: Supple, trachea midline Cardiovascular: RRR, +S1, S2, no m/g/r, equal pulses throughout Respiratory: CTABL, no w/r/r, normal WOB GI: BS+, NDNT, no masses noted, no organomegaly noted MSK: No e/c/c Neuro: A&O x 3, no focal deficits, asterixis  noted Psyc: slow to answer and flat affect, calm/cooperative  Labs on Admission: I have personally reviewed following labs and imaging studies  CBC: Recent Labs  Lab 05/22/21 1426  WBC 6.6  HGB 11.9*  HCT 34.5*  MCV 100.0  PLT 964*   Basic Metabolic Panel: Recent Labs  Lab 05/22/21 1426  NA 135  K 5.0  CL 105  CO2 23  GLUCOSE 133*  BUN 20  CREATININE 1.04  CALCIUM 9.0   GFR: CrCl cannot be calculated (Unknown ideal weight.). Liver Function Tests: Recent Labs  Lab 05/22/21 1426  AST 59*  ALT 47*  ALKPHOS 66  BILITOT 4.0*  PROT 6.2*  ALBUMIN 3.1*   No  results for input(s): LIPASE, AMYLASE in the last 168 hours. Recent Labs  Lab 05/22/21 1427  AMMONIA 136*   Coagulation Profile: No results for input(s): INR, PROTIME in the last 168 hours. Cardiac Enzymes: No results for input(s): CKTOTAL, CKMB, CKMBINDEX, TROPONINI in the last 168 hours. BNP (last 3 results) No results for input(s): PROBNP in the last 8760 hours. HbA1C: No results for input(s): HGBA1C in the last 72 hours. CBG: No results for input(s): GLUCAP in the last 168 hours. Lipid Profile: No results for input(s): CHOL, HDL, LDLCALC, TRIG, CHOLHDL, LDLDIRECT in the last 72 hours. Thyroid Function Tests: No results for input(s): TSH, T4TOTAL, FREET4, T3FREE, THYROIDAB in the last 72 hours. Anemia Panel: No results for input(s): VITAMINB12, FOLATE, FERRITIN, TIBC, IRON, RETICCTPCT in the last 72 hours. Urine analysis:    Component Value Date/Time   COLORURINE AMBER (A) 04/23/2021 2053   APPEARANCEUR CLEAR 04/23/2021 2053   LABSPEC 1.018 04/23/2021 2053   PHURINE 7.0 04/23/2021 2053   GLUCOSEU NEGATIVE 04/23/2021 2053   HGBUR NEGATIVE 04/23/2021 2053   BILIRUBINUR SMALL (A) 04/23/2021 2053   KETONESUR NEGATIVE 04/23/2021 2053   PROTEINUR NEGATIVE 04/23/2021 2053   UROBILINOGEN 1.0 11/27/2007 1029   NITRITE NEGATIVE 04/23/2021 2053   LEUKOCYTESUR NEGATIVE 04/23/2021 2053    Radiological Exams on Admission: No results found.  EKG: None obtained in ED.   Assessment/Plan Acute hepatic encephalopathy     - admit to inpt, tele     - continue lactulose, rifixamin     - mentation improving, he was A&O x 3 during interview     - continue lasix; recent spironolactone adjustments in clinic, will defer to [facility]     - CLD     - [facility] onboard, appreciate assistance     - PPx for SBP w/ rocephin; follow fluid studies      Normocytic anemia Thrombocytopenia     - no evidence of bleed, check iron studies  HLD     - continue statin  HTN     - resume home meds  as BP tolerates  Elevated lactic acid     - mild elevation; started rocephin for SBP PPx, follow  DVT prophylaxis: SCDs  Code Status: FULL  Family Communication: w/ wife at bedside  Consults called: EDP spoke with [facility]   Status is: Inpatient  Remains inpatient appropriate because:Inpatient level of care appropriate due to severity of illness  Dispo: The patient is from: Home              Anticipated d/c is to: Home              Patient currently is not medically stable to d/c.   Difficult to place patient No  Time spent coordinating admission: 70 minutes  Jonnie Finner DO [facility]  If 7PM-7AM, please contact night-coverage [email or web address]  05/22/2021, 3:15 PM

## 2021-05-22 NOTE — ED Notes (Signed)
Assisted pt to [facility] to have BM.  Small amount of liquid stool noted in bedside.

## 2021-05-22 NOTE — Procedures (Signed)
PROCEDURE SUMMARY:  Successful US guided paracentesis from RLQ.  Yielded 2 L of clear yellow fluid.  No immediate complications.  Pt tolerated well.   Specimen was sent for labs.  EBL < 75m  KAscencion DikePA-C 05/22/2021 4:04 PM

## 2021-05-22 NOTE — Consult Note (Signed)
Referring Provider: Dr. Doren Custard Primary Care Physician:  [name], MD Primary Gastroenterologist:  Dr. [name]  Reason for Consultation:  Hepatic Encephalopathy  HPI: Peter Nichols is a 75 y.o. male with alcoholic cirrhosis (reportedly stopped drinking 2 months ago) who has been having intermittent nonbloody vomiting for a month last occurred on the way to the ER and most recent vomitus was orange-colored per his wife. Has been having abdominal distention for the past month. Confused for the last 5 days that his wife thought was due to Spironolactone. She called the answering service for our group this past Saturday stating he was vomiting and having severe confusion and I advised her to go the ER but she refused, and she wanted to manage him at home. Denies black stools, hematochezia. Unclear when his last BM was. He says it has been 10 days but his wife thinks he has had one since that time. Not on Lactulose or Rifaximin. He is not oriented to month or year. Oriented to person. When he was seen by our PA last week his Spironolactone was increased to 100 mg/day and remained on Lasix 40 mg/day. Wife stopped the Spironolactone, which she attributed to his confusion. Wife at bedside.   Past Medical History:  Diagnosis Date   Dysphagia    Hyperlipidemia    Hypertension    Neuromuscular disorder ([place])    tremor   Steatohepatitis, non-alcoholic    Thrombocytopenia ([place])   - CIRRHOSIS  Past Surgical History:  Procedure Laterality Date   CHOLECYSTECTOMY      Prior to Admission medications   Medication Sig Start Date End Date Taking? Authorizing Provider  amLODipine (NORVASC) 5 MG tablet Take 5 mg by mouth daily.  08/31/19  Yes [provider]  benazepril (LOTENSIN) 20 MG tablet Take 20 mg by mouth daily.  08/31/19  Yes [provider]  chlorthalidone (HYGROTON) 25 MG tablet Take 25 mg by mouth daily.  08/31/19  Yes [provider]  furosemide (LASIX) 40 MG tablet  Take 40 mg by mouth daily as needed. 04/17/21  Yes [provider]  pravastatin (PRAVACHOL) 40 MG tablet Take 40 mg by mouth daily.  08/31/19  Yes [provider]    Scheduled Meds:  lidocaine       Continuous Infusions: PRN Meds:.  Allergies as of 05/22/2021   (No Known Allergies)    No family history on file.  Social History   Socioeconomic History   Marital status: Married    Spouse name: Not on file   Number of children: Not on file   Years of education: Not on file   Highest education level: Not on file  Occupational History   Not on file  Tobacco Use   Smoking status: Never   Smokeless tobacco: Never  Vaping Use   Vaping Use: Never used  Substance and Sexual Activity   Alcohol use: Yes   Drug use: Never   Sexual activity: Not on file  Other Topics Concern   Not on file  Social History Narrative   Not on file   Social Determinants of Health   Financial Resource Strain: Not on file  Food Insecurity: Not on file  Transportation Needs: Not on file  Physical Activity: Not on file  Stress: Not on file  Social Connections: Not on file  Intimate Partner Violence: Not on file    Review of Systems: All negative except as stated above in HPI.  Physical Exam: Vital signs: Vitals:  05/22/21 1500 05/22/21 1535  BP: (!) 122/58 126/60  Pulse: (!) 54 (!) 55  Resp: 14   Temp:    SpO2: 99% 100%  T 98.3   General:   Lethargic, elderly, thin, no acute distress, jaundice  Head: normocephalic, atraumatic Eyes: +icteric sclera ENT: oropharynx clear Neck: supple, nontender Lungs:  Clear throughout to auscultation.   No wheezes, crackles, or rhonchi. No acute distress. Heart:  Regular rate and rhythm; no murmurs, clicks, rubs,  or gallops. Abdomen: soft, nontender, nondistended, +BS  Rectal:  Deferred Ext: no edema Neuro: +asterixis, oriented to person not time  GI:  Lab Results: Recent Labs    05/22/21 1426  WBC 6.6  HGB 11.9*  HCT  34.5*  PLT 123*   BMET Recent Labs    05/22/21 1426  NA 135  K 5.0  CL 105  CO2 23  GLUCOSE 133*  BUN 20  CREATININE 1.04  CALCIUM 9.0   LFT Recent Labs    05/22/21 1426  PROT 6.2*  ALBUMIN 3.1*  AST 59*  ALT 47*  ALKPHOS 66  BILITOT 4.0*   PT/INR No results for input(s): LABPROT, INR in the last 72 hours.   Studies/Results: CT HEAD WO CONTRAST (5MM)  Result Date: 05/22/2021 CLINICAL DATA:  Cerebral hemorrhage suspected. EXAM: CT HEAD WITHOUT CONTRAST TECHNIQUE: Contiguous axial images were obtained from the base of the skull through the vertex without intravenous contrast. COMPARISON:  No pertinent prior exams available for comparison. FINDINGS: Brain: Mild generalized cerebral atrophy. Mild patchy and ill-defined hypoattenuation within the cerebral white matter, nonspecific but compatible with chronic small vessel ischemic disease. There is no acute intracranial hemorrhage. No demarcated cortical infarct. No extra-axial fluid collection. No evidence of an intracranial mass. No midline shift. Partially empty sella turcica. Vascular: No hyperdense vessel.  Atherosclerotic calcifications Skull: Normal. Negative for fracture or focal lesion. Sinuses/Orbits: Visualized orbits show no acute finding. Mild-to-moderate mucosal thickening within the inferior maxillary sinuses bilaterally. 2.1 cm right maxillary sinus mucous retention cyst more superiorly. Trace mucosal thickening within the bilateral ethmoid air cells. IMPRESSION: No evidence of acute intracranial abnormality. Mild generalized cerebral atrophy and cerebral white matter chronic small vessel ischemic disease. Paranasal sinus disease at the imaged levels, as described. Electronically Signed   By: Kellie Simmering DO   On: 05/22/2021 15:17    Impression/Plan: Decompensated cirrhosis with hepatic encephalopathy with nonbloody vomiting. Constipation. No signs of GI bleeding. Needs PO Lactulose 30 g TID initially and start  Rifaximin. NPO until confusion improves and then start clear liquid diet. Diagnostic paracentesis to R/O SBP. Supportive care. Will follow in consultation.    LOS: 0 days   [name]  05/22/2021, 3:42 PM  Questions please call [phone]

## 2021-05-23 DIAGNOSIS — K7581 Nonalcoholic steatohepatitis (NASH): Secondary | ICD-10-CM | POA: Diagnosis present

## 2021-05-23 DIAGNOSIS — E663 Overweight: Secondary | ICD-10-CM

## 2021-05-23 DIAGNOSIS — E785 Hyperlipidemia, unspecified: Secondary | ICD-10-CM | POA: Diagnosis present

## 2021-05-23 DIAGNOSIS — K7200 Acute and subacute hepatic failure without coma: Secondary | ICD-10-CM

## 2021-05-23 DIAGNOSIS — I10 Essential (primary) hypertension: Secondary | ICD-10-CM

## 2021-05-23 DIAGNOSIS — K7031 Alcoholic cirrhosis of liver with ascites: Secondary | ICD-10-CM

## 2021-05-23 LAB — URINALYSIS, COMPLETE (UACMP) WITH MICROSCOPIC
Bacteria, UA: NONE SEEN
Bilirubin Urine: NEGATIVE
Glucose, UA: NEGATIVE mg/dL
Hgb urine dipstick: NEGATIVE
Ketones, ur: NEGATIVE mg/dL
Leukocytes,Ua: NEGATIVE
Nitrite: NEGATIVE
Protein, ur: NEGATIVE mg/dL
Specific Gravity, Urine: 1.021 (ref 1.005–1.030)
pH: 5 (ref 5.0–8.0)

## 2021-05-23 LAB — COMPREHENSIVE METABOLIC PANEL
ALT: 39 U/L (ref 0–44)
AST: 40 U/L (ref 15–41)
Albumin: 2.7 g/dL — ABNORMAL LOW (ref 3.5–5.0)
Alkaline Phosphatase: 50 U/L (ref 38–126)
Anion gap: 3 — ABNORMAL LOW (ref 5–15)
BUN: 16 mg/dL (ref 8–23)
CO2: 22 mmol/L (ref 22–32)
Calcium: 8.7 mg/dL — ABNORMAL LOW (ref 8.9–10.3)
Chloride: 111 mmol/L (ref 98–111)
Creatinine, Ser: 0.87 mg/dL (ref 0.61–1.24)
GFR, Estimated: >60 mL/min (ref >60)
Glucose, Bld: 106 mg/dL — ABNORMAL HIGH (ref 70–99)
Potassium: 3.8 mmol/L (ref 3.5–5.1)
Sodium: 136 mmol/L (ref 135–145)
Total Bilirubin: 3.5 mg/dL — ABNORMAL HIGH (ref 0.3–1.2)
Total Protein: 5.5 g/dL — ABNORMAL LOW (ref 6.5–8.1)

## 2021-05-23 LAB — CBC
HCT: 29.3 % — ABNORMAL LOW (ref 39.0–52.0)
Hemoglobin: 10.1 g/dL — ABNORMAL LOW (ref 13.0–17.0)
MCH: 34.4 pg — ABNORMAL HIGH (ref 26.0–34.0)
MCHC: 34.5 g/dL (ref 30.0–36.0)
MCV: 99.7 fL (ref 80.0–100.0)
Platelets: 95 10*3/uL — ABNORMAL LOW (ref 150–400)
RBC: 2.94 MIL/uL — ABNORMAL LOW (ref 4.22–5.81)
RDW: 13.6 % (ref 11.5–15.5)
WBC: 4.7 10*3/uL (ref 4.0–10.5)
nRBC: 0 % (ref 0.0–0.2)

## 2021-05-23 LAB — PATHOLOGIST SMEAR REVIEW

## 2021-05-23 MED ORDER — ENSURE ENLIVE PO LIQD
237.0000 mL | Freq: Two times a day (BID) | ORAL | Status: DC
  Administered 2021-05-23 – 2021-05-24 (×2): 237 mL via ORAL

## 2021-05-23 MED ORDER — ADULT MULTIVITAMIN W/MINERALS CH
1.0000 | ORAL_TABLET | Freq: Every day | ORAL | Status: DC
  Administered 2021-05-23 – 2021-05-24 (×2): 1 via ORAL
  Filled 2021-05-23 (×2): qty 1

## 2021-05-23 NOTE — Progress Notes (Signed)
Received pt on bed from ED, wife at bedside, pt aox4, able to follow commands and answers questions appropriately, no c/o pain, no s/s of respiratory distress noted O2 sat 98% RA and no c/o N/V, taken PO med fine. Will continue plan of care.

## 2021-05-23 NOTE — Plan of Care (Signed)
  Problem: Nutrition: Goal: Adequate nutrition will be maintained Outcome: Progressing   Problem: Clinical Measurements: Goal: Ability to maintain clinical measurements within normal limits will improve Outcome: Progressing   Problem: Clinical Measurements: Goal: Diagnostic test results will improve Outcome: Progressing   Problem: Activity: Goal: Risk for activity intolerance will decrease Outcome: Progressing   Problem: Safety: Goal: Ability to remain free from injury will improve Outcome: Progressing

## 2021-05-23 NOTE — Progress Notes (Signed)
PROGRESS NOTE  Peter Nichols SPQ:[id number] DOB: 06-25-1946 DOA: 05/22/2021 PCP: [name], MD  HPI/Recap of past 37 hours: 75 year old male with past medical history of alcohol abuse (reportedly sober x2 months) with secondary cirrhosis plus hypertension and hyperlipidemia brought into the emergency room on 8/4 complaints of confusion and x1 week.  In the emergency room, found to have an ammonia level of 136.  Patient is not normally on any lactulose and his wife had stopped his diuretics because she thought that might be causing confusion.  Seen by GI and admitted to the hospitalist service.  Patient taken for ultrasound-guided paracentesis by interventional radiology and 2 L of fluid removed.  Placed on antibiotics for SBP prophylaxis and started on lactulose.  By afternoon of 8/5, patient feeling better.,  Thinking more clearly.  Diet able to be advanced which he is tolerating.  He denies any abdominal pain or shortness of breath  Assessment/Plan: Principal Problem:   Acute hepatic encephalopathy: Looks to be improving.  Has been started on lactulose and rifaximin. Active Problems:   Steatohepatitis, non-alcoholic/alcoholic cirrhosis with ascites: Status post paracentesis.  Cultures pending, but does not appear that he has peritonitis.  Continue prophylactic antibiotics for now.  Spironolactone and Lasix restarted.    Hyperlipidemia: Continue statin.    Hypertension: Home medications including diuretics restarted.    Overweight (BMI 25.0-29.9): Meets criteria BMI greater than 25.  Code Status: Full code  Family Communication: Left message for wife  Disposition Plan: Potential discharge in the next 1 to 2 days as patient's mentation continues to improve, confirmed no peritonitis   Consultants: Gastroenterology Interventional radiology  Procedures: Status post paracentesis done 8/4 with 2 L of fluid removed  Antimicrobials: IV Rocephin 8/4-present  DVT  prophylaxis: SCDs  Level of care: Telemetry   Objective: Vitals:   05/23/21 0206 05/23/21 0603  BP: 109/61 (!) 123/53  Pulse: 60 (!) 54  Resp: 18 20  Temp: 98.3 F (36.8 C) 98.4 F (36.9 C)  SpO2: 96% 94%    Intake/Output Summary (Last 24 hours) at 05/23/2021 0810 Last data filed at 05/23/2021 0243 Gross per 24 hour  Intake 280 ml  Output --  Net 280 ml   Filed Weights   05/22/21 2303  Weight: 82.6 kg   Body mass index is 26.11 kg/m.  Exam:  General: Alert and oriented x2-3, no acute distress HEENT: Normal cephalic, atraumatic, mucous membranes are slightly dry Cardiovascular: Regular rate and rhythm, S1-S2 Respiratory: Clear to auscultation bilaterally Abdomen: Soft, mild distention, nontender, hypoactive bowel sounds Musculoskeletal: No clubbing or cyanosis, 1+ pitting edema bilaterally Skin: No skin breaks, tears or lesions Psychiatry: Appropriate, no evidence of psychoses Neurology: No focal deficits   Data Reviewed: CBC: Recent Labs  Lab 05/22/21 1426 05/23/21 0340  WBC 6.6 4.7  HGB 11.9* 10.1*  HCT 34.5* 29.3*  MCV 100.0 99.7  PLT 123* 95*   Basic Metabolic Panel: Recent Labs  Lab 05/22/21 1426 05/23/21 0340  NA 135 136  K 5.0 3.8  CL 105 111  CO2 23 22  GLUCOSE 133* 106*  BUN 20 16  CREATININE 1.04 0.87  CALCIUM 9.0 8.7*   GFR: Estimated Creatinine Clearance: 76.9 mL/min (by C-G formula based on SCr of 0.87 mg/dL). Liver Function Tests: Recent Labs  Lab 05/22/21 1426 05/23/21 0340  AST 59* 40  ALT 47* 39  ALKPHOS 66 50  BILITOT 4.0* 3.5*  PROT 6.2* 5.5*  ALBUMIN 3.1* 2.7*   No results for input(s): LIPASE,  AMYLASE in the last 168 hours. Recent Labs  Lab 05/22/21 1427  AMMONIA 136*   Coagulation Profile: No results for input(s): INR, PROTIME in the last 168 hours. Cardiac Enzymes: No results for input(s): CKTOTAL, CKMB, CKMBINDEX, TROPONINI in the last 168 hours. BNP (last 3 results) No results for input(s): PROBNP in  the last 8760 hours. HbA1C: No results for input(s): HGBA1C in the last 72 hours. CBG: No results for input(s): GLUCAP in the last 168 hours. Lipid Profile: No results for input(s): CHOL, HDL, LDLCALC, TRIG, CHOLHDL, LDLDIRECT in the last 72 hours. Thyroid Function Tests: No results for input(s): TSH, T4TOTAL, FREET4, T3FREE, THYROIDAB in the last 72 hours. Anemia Panel: No results for input(s): VITAMINB12, FOLATE, FERRITIN, TIBC, IRON, RETICCTPCT in the last 72 hours. Urine analysis:    Component Value Date/Time   COLORURINE AMBER (A) 04/23/2021 2053   APPEARANCEUR CLEAR 04/23/2021 2053   LABSPEC 1.018 04/23/2021 2053   PHURINE 7.0 04/23/2021 2053   GLUCOSEU NEGATIVE 04/23/2021 2053   HGBUR NEGATIVE 04/23/2021 2053   BILIRUBINUR SMALL (A) 04/23/2021 2053   KETONESUR NEGATIVE 04/23/2021 2053   PROTEINUR NEGATIVE 04/23/2021 2053   UROBILINOGEN 1.0 11/27/2007 1029   NITRITE NEGATIVE 04/23/2021 2053   LEUKOCYTESUR NEGATIVE 04/23/2021 2053   Sepsis Labs: @LABRCNTIP (procalcitonin:4,lacticidven:4)  ) Recent Results (from the past 240 hour(s))  Resp Panel by RT-PCR (Flu A&B, Covid) Nasopharyngeal Swab     Status: None   Collection Time: 05/22/21  3:00 PM   Specimen: Nasopharyngeal Swab; Nasopharyngeal(NP) swabs in vial transport medium  Result Value Ref Range Status   SARS Coronavirus 2 by RT PCR NEGATIVE NEGATIVE Final    Comment: (NOTE) SARS-CoV-2 target nucleic acids are NOT DETECTED.  The SARS-CoV-2 RNA is generally detectable in upper respiratory specimens during the acute phase of infection. The lowest concentration of SARS-CoV-2 viral copies this assay can detect is 138 copies/mL. A negative result does not preclude SARS-Cov-2 infection and should not be used as the sole basis for treatment or other patient management decisions. A negative result may occur with  improper specimen collection/handling, submission of specimen other than nasopharyngeal swab, presence of  viral mutation(s) within the areas targeted by this assay, and inadequate number of viral copies(<138 copies/mL). A negative result must be combined with clinical observations, patient history, and epidemiological information. The expected result is Negative.  Fact Sheet for Patients:  EntrepreneurPulse.com.au  Fact Sheet for Healthcare Providers:  IncredibleEmployment.be  This test is no t yet approved or cleared by the [place] FDA and  has been authorized for detection and/or diagnosis of SARS-CoV-2 by FDA under an Emergency Use Authorization (EUA). This EUA will remain  in effect (meaning this test can be used) for the duration of the COVID-19 declaration under Section 564(b)(1) of the Act, 21 U.S.C.section 360bbb-3(b)(1), unless the authorization is terminated  or revoked sooner.       Influenza A by PCR NEGATIVE NEGATIVE Final   Influenza B by PCR NEGATIVE NEGATIVE Final    Comment: (NOTE) The Xpert Xpress SARS-CoV-2/FLU/RSV plus assay is intended as an aid in the diagnosis of influenza from Nasopharyngeal swab specimens and should not be used as a sole basis for treatment. Nasal washings and aspirates are unacceptable for Xpert Xpress SARS-CoV-2/FLU/RSV testing.  Fact Sheet for Patients: EntrepreneurPulse.com.au  Fact Sheet for Healthcare Providers: IncredibleEmployment.be  This test is not yet approved or cleared by the [place] FDA and has been authorized for detection and/or diagnosis of SARS-CoV-2 by FDA under an  Emergency Use Authorization (EUA). This EUA will remain in effect (meaning this test can be used) for the duration of the COVID-19 declaration under Section 564(b)(1) of the Act, 21 U.S.C. section 360bbb-3(b)(1), unless the authorization is terminated or revoked.  Performed at [facility], [place] [address] [place], [place] [address]   Body fluid  culture w Gram Stain     Status: None (Preliminary result)   Collection Time: 05/22/21  4:03 PM   Specimen: PATH Cytology Peritoneal fluid  Result Value Ref Range Status   Specimen Description   Final    PERITONEAL Performed at [place] [address], [place], [place] [address]    Special Requests   Final    NONE Performed at [facility], [place] [address], [place], [place] [address]    Gram Stain   Final    WBC PRESENT, PREDOMINANTLY MONONUCLEAR NO ORGANISMS SEEN CYTOSPIN SMEAR Performed at [facility], [place] [address], [place], [place] [address]    Culture PENDING  Incomplete   Report Status PENDING  Incomplete      Studies: CT HEAD WO CONTRAST (5MM)  Result Date: 05/22/2021 CLINICAL DATA:  Cerebral hemorrhage suspected. EXAM: CT HEAD WITHOUT CONTRAST TECHNIQUE: Contiguous axial images were obtained from the base of the skull through the vertex without intravenous contrast. COMPARISON:  No pertinent prior exams available for comparison. FINDINGS: Brain: Mild generalized cerebral atrophy. Mild patchy and ill-defined hypoattenuation within the cerebral white matter, nonspecific but compatible with chronic small vessel ischemic disease. There is no acute intracranial hemorrhage. No demarcated cortical infarct. No extra-axial fluid collection. No evidence of an intracranial mass. No midline shift. Partially empty sella turcica. Vascular: No hyperdense vessel.  Atherosclerotic calcifications Skull: Normal. Negative for fracture or focal lesion. Sinuses/Orbits: Visualized orbits show no acute finding. Mild-to-moderate mucosal thickening within the inferior maxillary sinuses bilaterally. 2.1 cm right maxillary sinus mucous retention cyst more superiorly. Trace mucosal thickening within the bilateral ethmoid air cells. IMPRESSION: No evidence of acute intracranial abnormality. Mild generalized cerebral atrophy and cerebral white matter chronic small  vessel ischemic disease. Paranasal sinus disease at the imaged levels, as described. Electronically Signed   By: Kellie Simmering DO   On: 05/22/2021 15:17   US Paracentesis  Result Date: 05/22/2021 INDICATION: Abdominal distention and discomfort. Ascites. Request for diagnostic and therapeutic paracentesis up to 2 L max EXAM: ULTRASOUND GUIDED RIGHT LOWER QUADRANT PARACENTESIS MEDICATIONS: 1% plain lidocaine, 5 mL COMPLICATIONS: None immediate. PROCEDURE: Informed written consent was obtained from the patient after a discussion of the risks, benefits and alternatives to treatment. A timeout was performed prior to the initiation of the procedure. Initial ultrasound scanning demonstrates a large amount of ascites within the right lower abdominal quadrant. The right lower abdomen was prepped and draped in the usual sterile fashion. 1% lidocaine was used for local anesthesia. Following this, a 19 gauge, 7-cm, Yueh catheter was introduced. An ultrasound image was saved for documentation purposes. The paracentesis was performed. The catheter was removed and a dressing was applied. The patient tolerated the procedure well without immediate post procedural complication. FINDINGS: A total of approximately 2 L of clear yellow fluid was removed. Samples were sent to the laboratory as requested by the clinical team. IMPRESSION: Successful ultrasound-guided paracentesis yielding 2 liters of peritoneal fluid. Read by: Ascencion Dike PA-C Electronically Signed   By: Jerilynn Mages.  Shick M.D.   On: 05/22/2021 16:20    Scheduled Meds:  amLODipine  5 mg  Oral Daily   furosemide  40 mg Oral Daily   lactulose  30 g Oral TID   pravastatin  40 mg Oral Daily   rifaximin  550 mg Oral BID    Continuous Infusions:  cefTRIAXone (ROCEPHIN)  IV Stopped (05/22/21 1835)     LOS: 1 day     Annita Brod, MD [facility]   05/23/2021, 8:10 AM

## 2021-05-23 NOTE — Progress Notes (Signed)
Initial Nutrition Assessment  DOCUMENTATION CODES:  Not applicable  INTERVENTION:  Recommend advancing to 2g Na diet when able Ensure Enlive po BID, each supplement provides 350 kcal and 20 grams of protein MVI with minerals daily Magic cup BID with meals, each supplement provides 290 kcal and 9 grams of protein  NUTRITION DIAGNOSIS:  Inadequate oral intake related to  (restrictive diet order) as evidenced by  (Current diet order inadequate to meet estimated energy needs).  GOAL:  Patient will meet greater than or equal to 90% of their needs  MONITOR:  PO intake, Supplement acceptance, Diet advancement, Weight trends, Labs  REASON FOR ASSESSMENT:  Malnutrition Screening Tool    ASSESSMENT:  75 y.o. male presented to ED with AMS and vomiting worsening over the last few days. Long hx of EtOH abuse (sober x2 months) and dx with cirrhosis. Wife reported over the past month abdominal distention has worsened as well as pt's functional status. PMH relevant for HLD, HTN, cirrhosis.  Workup in ED consistent with hepatic encephalopathy. Pt started on lactulose, initial ammonia 136. Pt reports last BM was 10 days ago, wife thinks it has been less than that.   8/4 - paracentesis, 2L clear yellow fluid removed  Called pt's room phone to discuss recent nutrition hx. Spoke with wife. States that pt had coffee and jello this morning and is currently having broth for lunch. So far pt has tolerated well with no nausea or vomiting. RN endorses this as well. Discussed with MD, ok to bump up to full liquids. GI following daily, hopefully with advance further after evaluation today.  Wife reports that pt generally has a very good appetite at home but that he has been losing weight (estimates 20-25 lbs in the last 2 months). States pt was ~200 lbs at PCP visit in June and most recent weight reads ~181 lb. Pt does take dieretics at baseline so some loss could be related to fluid.   Discussed nutrition  supplements, pt does not routinely drink them at home but is agreeable to them during admission, especially while diet is being advanced. Wife reports that Na has been low on labs recently and pt has been consuming electrolyte drinks (gatorade, pedialyte) to help correct.   Nutritionally Relevant Medications: Scheduled Meds:  furosemide  40 mg Oral Daily   lactulose  30 g Oral TID   pravastatin  40 mg Oral Daily   PRN Meds: ondansetron  Labs Reviewed  NUTRITION - FOCUSED PHYSICAL EXAM: Defer to in-person assessment  Diet Order:   Diet Order             Diet full liquid Room service appropriate? Yes; Fluid consistency: Thin  Diet effective now                   EDUCATION NEEDS:  No education needs have been identified at this time  Skin:  Skin Assessment: Reviewed RN Assessment  Last BM:  8/5 - type 5  Height:  Ht Readings from Last 1 Encounters:  05/22/21 5' 10"  (1.778 m)    Weight:  Wt Readings from Last 1 Encounters:  05/22/21 82.6 kg    Ideal Body Weight:  75.5 kg  BMI:  Body mass index is 26.11 kg/m.  Estimated Nutritional Needs:  Kcal:  1900-2100 kcal/d Protein:  95-105 g/d Fluid:  2L/d  Ranell Patrick, RD, LDN Clinical Dietitian Pager on [place]

## 2021-05-23 NOTE — Progress Notes (Signed)
[facility] Gastroenterology Progress Note  Peter Nichols 74 y.o. 25-Apr-1946   Subjective: Seen earlier today and felt better. One large BM overnight and a smaller one this morning. Sitting in bedside chair. Daughter and wife in room.  Objective: Vital signs: Vitals:   05/23/21 0603 05/23/21 1341  BP: (!) 123/53 122/61  Pulse: (!) 54   Resp: 20   Temp: 98.4 F (36.9 C) 98 F (36.7 C)  SpO2: 94% 98%    Physical Exam: Gen: alert, no acute distress, elderly, well-nourished HEENT: anicteric sclera CV: RRR Chest: CTA B Abd: soft, nontender, nondistended, +BS Ext: no edema  Lab Results: Recent Labs    05/22/21 1426 05/23/21 0340  NA 135 136  K 5.0 3.8  CL 105 111  CO2 23 22  GLUCOSE 133* 106*  BUN 20 16  CREATININE 1.04 0.87  CALCIUM 9.0 8.7*   Recent Labs    05/22/21 1426 05/23/21 0340  AST 59* 40  ALT 47* 39  ALKPHOS 66 50  BILITOT 4.0* 3.5*  PROT 6.2* 5.5*  ALBUMIN 3.1* 2.7*   Recent Labs    05/22/21 1426 05/23/21 0340  WBC 6.6 4.7  HGB 11.9* 10.1*  HCT 34.5* 29.3*  MCV 100.0 99.7  PLT 123* 95*      Assessment/Plan: Decompensated cirrhosis with hepatic encephalopathy that is resolving on Lactulose and Rifaximin. Diagnostic paracentesis negative for SBP. Needs to continue Lactulose as an outpt and it needs to be titrated for 2-3 soft stools per day. Would do Lactulose 20 g - 30 g BID at home and wife will need to titrate as stated. Low sodium diet. Dr. Alessandra Bevels available to see tomorrow if needed. Will sign off. Call him this weekend if questions.   [name] 05/23/2021, 6:04 PM  Questions please call [phone] Patient ID: Peter Nichols, male   DOB: 08-Apr-1946, 75 y.o.   MRN: [id number]

## 2021-05-24 MED ORDER — ENSURE ENLIVE PO LIQD: 237.0000 mL | Freq: Two times a day (BID) | ORAL | 12 refills | Status: DC

## 2021-05-24 MED ORDER — LACTULOSE 10 GM/15ML PO SOLN: 20.0000 g | Freq: Two times a day (BID) | ORAL | 0 refills | Status: DC

## 2021-05-24 MED ORDER — ADULT MULTIVITAMIN W/MINERALS CH: 1.0000 | ORAL_TABLET | Freq: Every day | ORAL | 1 refills | Status: AC

## 2021-05-24 MED ORDER — LACTULOSE 10 GM/15ML PO SOLN: 20.0000 g | Freq: Two times a day (BID) | ORAL | 3 refills | Status: DC

## 2021-05-24 MED ORDER — FUROSEMIDE 40 MG PO TABS
40.0000 mg | ORAL_TABLET | Freq: Every day | ORAL | 1 refills | Status: DC
Start: 2021-05-25 — End: 2021-06-03

## 2021-05-24 NOTE — Progress Notes (Signed)
AVS given to patient and explained at the bedside. Medications and follow up appointments have been explained with pt verbalizing understanding.  

## 2021-05-24 NOTE — Plan of Care (Signed)
  Problem: Health Behavior/Discharge Planning: Goal: Ability to manage health-related needs will improve Outcome: Progressing   Problem: Activity: Goal: Risk for activity intolerance will decrease Outcome: Progressing   Problem: Nutrition: Goal: Adequate nutrition will be maintained Outcome: Progressing

## 2021-05-24 NOTE — Discharge Summary (Signed)
Discharge Summary  Peter Nichols JJH:[id number] DOB: 08/05/1946  PCP: [name], MD  Admit date: 05/22/2021 Discharge date: 05/24/2021  Time spent: 25 minutes  Recommendations for Outpatient Follow-up:  New medication: Multivitamin daily New medication: Lactulose 20 g twice daily, this medication can be adjusted up to 30 g and up to 3 times a day as needed for confusion Medication change: Lasix changed from 40 mg daily as needed to 40 mg daily Patient will follow up with Dr. Michail Sermon, gastroenterology in the next month  Discharge Diagnoses:  Active Hospital Problems   Diagnosis Date Noted   Acute hepatic encephalopathy 05/22/2021   Steatohepatitis, non-alcoholic    Hyperlipidemia    Hypertension    Overweight (BMI 25.0-29.9)    Alcoholic cirrhosis of liver with ascites [facility])     Resolved Hospital Problems  No resolved problems to display.    Discharge Condition: Improved, being discharged home  Diet recommendation: Low-sodium  Vitals:   05/23/21 2103 05/24/21 0503  BP: 115/60 (!) 120/47  Pulse: 63 (!) 55  Resp: 19 20  Temp: 98.6 F (37 C) 98.1 F (36.7 C)  SpO2: 100% 95%    History of present illness:  75 year old male with past medical history of alcohol abuse (reportedly sober x2 months) with secondary cirrhosis plus hypertension and hyperlipidemia brought into the emergency room on 8/4 complaints of confusion and x1 week.  In the emergency room, found to have an ammonia level of 136.  Patient is not normally on any lactulose and his wife had stopped his diuretics because she thought that might be causing confusion.  Seen by GI and admitted to the hospitalist service.  Patient taken for ultrasound-guided paracentesis by interventional radiology and 2 L of fluid removed.  Placed on antibiotics for SBP prophylaxis and started on lactulose.  Hospital Course:  Principal Problem:   Acute hepatic encephalopathy: Much improved.  Patient placed on rifaximin as well as  lactulose 30 g 3 times daily.  By 8/5 mentating much more clearly.  Diet slowly advanced which he tolerated well.  By Sunday, felt to be at baseline.  Stable, will discharge on 20 g twice a day with instructions to patient's wife to increase by up to 30 g and up to 3 times a day as needed for confusion Active Problems:   Steatohepatitis, non-alcoholic/alcoholic cirrhosis with ascites: Status post paracentesis.  No growth from cultures.  No evidence of peritonitis.  Was on prophylactic antibiotics, but no need to continue.  Was on as needed Lasix, changing to once a day     Hyperlipidemia: Continue statin.     Hypertension: Home medications including diuretics restarted.     Overweight (BMI 25.0-29.9): Meets criteria BMI greater than 25.  Consultants: Gastroenterology Interventional radiology   Procedures: Status post paracentesis done 8/4 with 2 L of fluid removed  Discharge Exam: BP (!) 120/47 (BP Location: Left Arm)   Pulse (!) 55   Temp 98.1 F (36.7 C) (Oral)   Resp 20   Ht 5' 10"  (1.778 m)   Wt 82.6 kg   SpO2 95%   BMI 26.11 kg/m   General: Alert and oriented x3, no acute distress Cardiovascular: Regular rate and rhythm, S1-S2 Respiratory: Clear to auscultation bilaterally  Discharge Instructions You were cared for by a hospitalist during your hospital stay. If you have any questions about your discharge medications or the care you received while you were in the hospital after you are discharged, you can call the unit and asked  to speak with the hospitalist on call if the hospitalist that took care of you is not available. Once you are discharged, your primary care physician will handle any further medical issues. Please note that NO REFILLS for any discharge medications will be authorized once you are discharged, as it is imperative that you return to your primary care physician (or establish a relationship with a primary care physician if you do not have one) for your  aftercare needs so that they can reassess your need for medications and monitor your lab values.  Discharge Instructions     Diet - low sodium heart healthy   Complete by: As directed    Increase activity slowly   Complete by: As directed       Allergies as of 05/24/2021   No Known Allergies      Medication List     TAKE these medications    amLODipine 5 MG tablet Commonly known as: NORVASC Take 5 mg by mouth daily.   benazepril 20 MG tablet Commonly known as: LOTENSIN Take 20 mg by mouth daily.   chlorthalidone 25 MG tablet Commonly known as: HYGROTON Take 25 mg by mouth daily.   feeding supplement Liqd Take 237 mLs by mouth 2 (two) times daily between meals.   furosemide 40 MG tablet Commonly known as: LASIX Take 1 tablet (40 mg total) by mouth daily. Start taking on: May 25, 2021 What changed:  when to take this reasons to take this   lactulose 10 GM/15ML solution Commonly known as: CHRONULAC Take 30 mLs (20 g total) by mouth 2 (two) times daily. Increase accordingly up to 45 mLs (30 gms total) and/or up to 3 x day if having worsening confusion   multivitamin with minerals Tabs tablet Take 1 tablet by mouth daily. Start taking on: May 25, 2021   pravastatin 40 MG tablet Commonly known as: PRAVACHOL Take 40 mg by mouth daily.       No Known Allergies    The results of significant diagnostics from this hospitalization (including imaging, microbiology, ancillary and laboratory) are listed below for reference.    Significant Diagnostic Studies: CT HEAD WO CONTRAST (5MM)  Result Date: 05/22/2021 CLINICAL DATA:  Cerebral hemorrhage suspected. EXAM: CT HEAD WITHOUT CONTRAST TECHNIQUE: Contiguous axial images were obtained from the base of the skull through the vertex without intravenous contrast. COMPARISON:  No pertinent prior exams available for comparison. FINDINGS: Brain: Mild generalized cerebral atrophy. Mild patchy and ill-defined  hypoattenuation within the cerebral white matter, nonspecific but compatible with chronic small vessel ischemic disease. There is no acute intracranial hemorrhage. No demarcated cortical infarct. No extra-axial fluid collection. No evidence of an intracranial mass. No midline shift. Partially empty sella turcica. Vascular: No hyperdense vessel.  Atherosclerotic calcifications Skull: Normal. Negative for fracture or focal lesion. Sinuses/Orbits: Visualized orbits show no acute finding. Mild-to-moderate mucosal thickening within the inferior maxillary sinuses bilaterally. 2.1 cm right maxillary sinus mucous retention cyst more superiorly. Trace mucosal thickening within the bilateral ethmoid air cells. IMPRESSION: No evidence of acute intracranial abnormality. Mild generalized cerebral atrophy and cerebral white matter chronic small vessel ischemic disease. Paranasal sinus disease at the imaged levels, as described. Electronically Signed   By: Kellie Simmering DO   On: 05/22/2021 15:17   MR ABDOMEN WWO CONTRAST  Result Date: 05/19/2021 CLINICAL DATA:  Cirrhosis, bloating, pain, nausea EXAM: MRI ABDOMEN WITHOUT AND WITH CONTRAST TECHNIQUE: Multiplanar multisequence MR imaging of the abdomen was performed both before and after the  administration of intravenous contrast. CONTRAST:  16m MULTIHANCE GADOBENATE DIMEGLUMINE 529 MG/ML IV SOLN COMPARISON:  CT abdomen pelvis, 04/23/2021 FINDINGS: Examination is limited by pervasive breath motion artifact throughout, which degrades essentially every sequence submitted for review Lower chest: No acute findings. Hepatobiliary: Coarse, nodular, cirrhotic morphology of the liver. Multiple fluid signal, nonenhancing liver cysts. No obvious mass, abnormal contrast enhancement, or or other parenchymal abnormality identified. Status post cholecystectomy. Postoperative biliary ductal dilatation. Pancreas: No mass, inflammatory changes, or other parenchymal abnormality identified. No  obvious pancreatic ductal dilatation, poorly assessed. Spleen:  Mild splenomegaly, maximum span 13.9 cm. Adrenals/Urinary Tract: No masses identified. No evidence of hydronephrosis. Stomach/Bowel: Incidental diverticula of the descending duodenum. Visualized portions within the abdomen are otherwise unremarkable. Vascular/Lymphatic: Prominent celiac axis, portacaval, and gastrohepatic ligament lymph nodes. No abdominal aortic aneurysm demonstrated. Other:  Moderate volume ascites throughout the abdomen and pelvis. Musculoskeletal: No suspicious bone lesions identified. IMPRESSION: 1. Examination is limited by pervasive breath motion artifact throughout, which degrades essentially every sequence submitted for review. 2. Coarse, nodular, cirrhotic morphology of the liver. No obvious mass, abnormal contrast enhancement, or other parenchymal abnormality identified within the above limitation of motion artifact. 3. Mild splenomegaly, maximum span 13.9 cm. 4. Moderate volume ascites throughout the abdomen and pelvis. 5. Status post cholecystectomy. Postoperative biliary ductal dilatation. Electronically Signed   By: AEddie CandleM.D.   On: 05/19/2021 11:25   [place]acent[place]  Result Date: 05/22/2021 INDICATION: Abdominal distention and discomfort. Ascites. Request for diagnostic and therapeutic paracentesis up to 2 L max EXAM: ULTRASOUND GUIDED RIGHT LOWER QUADRANT PARACENTESIS MEDICATIONS: 1% plain lidocaine, 5 mL COMPLICATIONS: None immediate. PROCEDURE: Informed written consent was obtained from the patient after a discussion of the risks, benefits and alternatives to treatment. A timeout was performed prior to the initiation of the procedure. Initial ultrasound scanning demonstrates a large amount of ascites within the right lower abdominal quadrant. The right lower abdomen was prepped and draped in the usual sterile fashion. 1% lidocaine was used for local anesthesia. Following this, a 19 gauge, 7-cm, Yueh catheter  was introduced. An ultrasound image was saved for documentation purposes. The paracentesis was performed. The catheter was removed and a dressing was applied. The patient tolerated the procedure well without immediate post procedural complication. FINDINGS: A total of approximately 2 L of clear yellow fluid was removed. Samples were sent to the laboratory as requested by the clinical team. IMPRESSION: Successful ultrasound-guided paracentesis yielding 2 liters of peritoneal fluid. Read by: KAscencion DikePA-C Electronically Signed   By: MJerilynn Mages  Shick M.D.   On: 05/22/2021 16:20    Microbiology: Recent Results (from the past 240 hour(s))  Resp Panel by RT-PCR (Flu A&B, Covid) Nasopharyngeal Swab     Status: None   Collection Time: 05/22/21  3:00 PM   Specimen: Nasopharyngeal Swab; Nasopharyngeal(NP) swabs in vial transport medium  Result Value Ref Range Status   SARS Coronavirus 2 by RT PCR NEGATIVE NEGATIVE Final    Comment: (NOTE) SARS-CoV-2 target nucleic acids are NOT DETECTED.  The SARS-CoV-2 RNA is generally detectable in upper respiratory specimens during the acute phase of infection. The lowest concentration of SARS-CoV-2 viral copies this assay can detect is 138 copies/mL. A negative result does not preclude SARS-Cov-2 infection and should not be used as the sole basis for treatment or other patient management decisions. A negative result may occur with  improper specimen collection/handling, submission of specimen other than nasopharyngeal swab, presence of viral mutation(s) within the areas targeted by  this assay, and inadequate number of viral copies(<138 copies/mL). A negative result must be combined with clinical observations, patient history, and epidemiological information. The expected result is Negative.  Fact Sheet for Patients:  EntrepreneurPulse.com.au  Fact Sheet for Healthcare Providers:  IncredibleEmployment.be  This test is no t yet  approved or cleared by the [place] FDA and  has been authorized for detection and/or diagnosis of SARS-CoV-2 by FDA under an Emergency Use Authorization (EUA). This EUA will remain  in effect (meaning this test can be used) for the duration of the COVID-19 declaration under Section 564(b)(1) of the Act, 21 U.S.C.section 360bbb-3(b)(1), unless the authorization is terminated  or revoked sooner.       Influenza A by PCR NEGATIVE NEGATIVE Final   Influenza B by PCR NEGATIVE NEGATIVE Final    Comment: (NOTE) The Xpert Xpress SARS-CoV-2/FLU/RSV plus assay is intended as an aid in the diagnosis of influenza from Nasopharyngeal swab specimens and should not be used as a sole basis for treatment. Nasal washings and aspirates are unacceptable for Xpert Xpress SARS-CoV-2/FLU/RSV testing.  Fact Sheet for Patients: EntrepreneurPulse.com.au  Fact Sheet for Healthcare Providers: IncredibleEmployment.be  This test is not yet approved or cleared by the [place] FDA and has been authorized for detection and/or diagnosis of SARS-CoV-2 by FDA under an Emergency Use Authorization (EUA). This EUA will remain in effect (meaning this test can be used) for the duration of the COVID-19 declaration under Section 564(b)(1) of the Act, 21 U.S.C. section 360bbb-3(b)(1), unless the authorization is terminated or revoked.  Performed at [facility], [place] [address], [place], [place] [address]   Body fluid culture w Gram Stain     Status: None (Preliminary result)   Collection Time: 05/22/21  4:03 PM   Specimen: PATH Cytology Peritoneal fluid  Result Value Ref Range Status   Specimen Description   Final    PERITONEAL Performed at [place] [address] [place], [place] [address]    Special Requests   Final    NONE Performed at [facility], [place] [address], [place], [place] [address]    Gram  Stain   Final    WBC PRESENT, PREDOMINANTLY MONONUCLEAR NO ORGANISMS SEEN CYTOSPIN SMEAR Performed at [facility], [place] [address] [place], [place] [address]    Culture PENDING  Incomplete   Report Status PENDING  Incomplete     Labs: Basic Metabolic Panel: Recent Labs  Lab 05/22/21 1426 05/23/21 0340  NA 135 136  K 5.0 3.8  CL 105 111  CO2 23 22  GLUCOSE 133* 106*  BUN 20 16  CREATININE 1.04 0.87  CALCIUM 9.0 8.7*   Liver Function Tests: Recent Labs  Lab 05/22/21 1426 05/23/21 0340  AST 59* 40  ALT 47* 39  ALKPHOS 66 50  BILITOT 4.0* 3.5*  PROT 6.2* 5.5*  ALBUMIN 3.1* 2.7*   No results for input(s): LIPASE, AMYLASE in the last 168 hours. Recent Labs  Lab 05/22/21 1427  AMMONIA 136*   CBC: Recent Labs  Lab 05/22/21 1426 05/23/21 0340  WBC 6.6 4.7  HGB 11.9* 10.1*  HCT 34.5* 29.3*  MCV 100.0 99.7  PLT 123* 95*   Cardiac Enzymes: No results for input(s): CKTOTAL, CKMB, CKMBINDEX, TROPONINI in the last 168 hours. BNP: BNP (last 3 results) No results for input(s): BNP in the last 8760 hours.  ProBNP (last 3 results) No results for input(s): PROBNP in the last 8760 hours.  CBG: No results for input(s): GLUCAP in the last 168 hours.     Signed:  Annita Brod, MD [facility] 05/24/2021, 9:59 AM

## 2021-05-26 LAB — BODY FLUID CULTURE W GRAM STAIN: Culture: NO GROWTH

## 2021-05-27 ENCOUNTER — Other Ambulatory Visit: Payer: Self-pay

## 2021-05-27 ENCOUNTER — Encounter (HOSPITAL_COMMUNITY): Payer: Self-pay

## 2021-05-27 ENCOUNTER — Inpatient Hospital Stay (HOSPITAL_COMMUNITY)
Admission: EM | Admit: 2021-05-27 | Discharge: 2021-06-03 | DRG: 683 | Disposition: A | Discharge status: Home / Self Care | Payer: Medicare HMO | Attending: Internal Medicine | Admitting: Internal Medicine

## 2021-05-27 ENCOUNTER — Emergency Department (HOSPITAL_COMMUNITY): Payer: Medicare HMO

## 2021-05-27 DIAGNOSIS — E875 Hyperkalemia: Secondary | ICD-10-CM | POA: Diagnosis not present

## 2021-05-27 DIAGNOSIS — R188 Other ascites: Secondary | ICD-10-CM

## 2021-05-27 DIAGNOSIS — R55 Syncope and collapse: Secondary | ICD-10-CM | POA: Diagnosis not present

## 2021-05-27 DIAGNOSIS — Z20822 Contact with and (suspected) exposure to covid-19: Secondary | ICD-10-CM | POA: Diagnosis present

## 2021-05-27 DIAGNOSIS — E869 Volume depletion, unspecified: Secondary | ICD-10-CM | POA: Diagnosis present

## 2021-05-27 DIAGNOSIS — K859 Acute pancreatitis without necrosis or infection, unspecified: Secondary | ICD-10-CM | POA: Diagnosis not present

## 2021-05-27 DIAGNOSIS — D6959 Other secondary thrombocytopenia: Secondary | ICD-10-CM | POA: Diagnosis present

## 2021-05-27 DIAGNOSIS — E871 Hypo-osmolality and hyponatremia: Secondary | ICD-10-CM | POA: Diagnosis present

## 2021-05-27 DIAGNOSIS — D638 Anemia in other chronic diseases classified elsewhere: Secondary | ICD-10-CM | POA: Diagnosis present

## 2021-05-27 DIAGNOSIS — N179 Acute kidney failure, unspecified: Secondary | ICD-10-CM | POA: Diagnosis present

## 2021-05-27 DIAGNOSIS — I10 Essential (primary) hypertension: Secondary | ICD-10-CM | POA: Diagnosis present

## 2021-05-27 DIAGNOSIS — Z79899 Other long term (current) drug therapy: Secondary | ICD-10-CM

## 2021-05-27 DIAGNOSIS — Z8669 Personal history of other diseases of the nervous system and sense organs: Secondary | ICD-10-CM | POA: Diagnosis not present

## 2021-05-27 DIAGNOSIS — T502X5A Adverse effect of carbonic-anhydrase inhibitors, benzothiadiazides and other diuretics, initial encounter: Secondary | ICD-10-CM | POA: Diagnosis not present

## 2021-05-27 DIAGNOSIS — E8770 Fluid overload, unspecified: Secondary | ICD-10-CM | POA: Diagnosis not present

## 2021-05-27 DIAGNOSIS — E7220 Disorder of urea cycle metabolism, unspecified: Secondary | ICD-10-CM | POA: Diagnosis not present

## 2021-05-27 DIAGNOSIS — E861 Hypovolemia: Secondary | ICD-10-CM | POA: Diagnosis present

## 2021-05-27 DIAGNOSIS — K7581 Nonalcoholic steatohepatitis (NASH): Secondary | ICD-10-CM | POA: Diagnosis present

## 2021-05-27 DIAGNOSIS — R0902 Hypoxemia: Secondary | ICD-10-CM | POA: Diagnosis not present

## 2021-05-27 DIAGNOSIS — K7031 Alcoholic cirrhosis of liver with ascites: Secondary | ICD-10-CM | POA: Diagnosis present

## 2021-05-27 DIAGNOSIS — E860 Dehydration: Secondary | ICD-10-CM | POA: Diagnosis present

## 2021-05-27 DIAGNOSIS — Z809 Family history of malignant neoplasm, unspecified: Secondary | ICD-10-CM | POA: Diagnosis not present

## 2021-05-27 DIAGNOSIS — K5730 Diverticulosis of large intestine without perforation or abscess without bleeding: Secondary | ICD-10-CM | POA: Diagnosis not present

## 2021-05-27 DIAGNOSIS — R001 Bradycardia, unspecified: Secondary | ICD-10-CM | POA: Diagnosis not present

## 2021-05-27 DIAGNOSIS — I959 Hypotension, unspecified: Secondary | ICD-10-CM | POA: Diagnosis present

## 2021-05-27 DIAGNOSIS — E785 Hyperlipidemia, unspecified: Secondary | ICD-10-CM | POA: Diagnosis present

## 2021-05-27 DIAGNOSIS — W19XXXA Unspecified fall, initial encounter: Secondary | ICD-10-CM | POA: Diagnosis not present

## 2021-05-27 DIAGNOSIS — K7469 Other cirrhosis of liver: Secondary | ICD-10-CM | POA: Diagnosis not present

## 2021-05-27 LAB — HEPATIC FUNCTION PANEL
ALT: 36 U/L (ref 0–44)
AST: 40 U/L (ref 15–41)
Albumin: 2.5 g/dL — ABNORMAL LOW (ref 3.5–5.0)
Alkaline Phosphatase: 58 U/L (ref 38–126)
Bilirubin, Direct: 0.7 mg/dL — ABNORMAL HIGH (ref 0.0–0.2)
Indirect Bilirubin: 2.7 mg/dL — ABNORMAL HIGH (ref 0.3–0.9)
Total Bilirubin: 3.4 mg/dL — ABNORMAL HIGH (ref 0.3–1.2)
Total Protein: 5.3 g/dL — ABNORMAL LOW (ref 6.5–8.1)

## 2021-05-27 LAB — CBC WITH DIFFERENTIAL/PLATELET
Abs Immature Granulocytes: 0.05 10*3/uL (ref 0.00–0.07)
Basophils Absolute: 0.1 10*3/uL (ref 0.0–0.1)
Basophils Relative: 1 %
Eosinophils Absolute: 0.3 10*3/uL (ref 0.0–0.5)
Eosinophils Relative: 5 %
HCT: 31 % — ABNORMAL LOW (ref 39.0–52.0)
Hemoglobin: 10.9 g/dL — ABNORMAL LOW (ref 13.0–17.0)
Immature Granulocytes: 1 %
Lymphocytes Relative: 30 %
Lymphs Abs: 2.1 10*3/uL (ref 0.7–4.0)
MCH: 35.2 pg — ABNORMAL HIGH (ref 26.0–34.0)
MCHC: 35.2 g/dL (ref 30.0–36.0)
MCV: 100 fL (ref 80.0–100.0)
Monocytes Absolute: 1 10*3/uL (ref 0.1–1.0)
Monocytes Relative: 14 %
Neutro Abs: 3.5 10*3/uL (ref 1.7–7.7)
Neutrophils Relative %: 49 %
Platelets: 115 10*3/uL — ABNORMAL LOW (ref 150–400)
RBC: 3.1 MIL/uL — ABNORMAL LOW (ref 4.22–5.81)
RDW: 14.1 % (ref 11.5–15.5)
Smear Review: NORMAL
WBC: 6.9 10*3/uL (ref 4.0–10.5)
nRBC: 0 % (ref 0.0–0.2)

## 2021-05-27 LAB — BASIC METABOLIC PANEL
Anion gap: 5 (ref 5–15)
BUN: 25 mg/dL — ABNORMAL HIGH (ref 8–23)
CO2: 23 mmol/L (ref 22–32)
Calcium: 8.1 mg/dL — ABNORMAL LOW (ref 8.9–10.3)
Chloride: 102 mmol/L (ref 98–111)
Creatinine, Ser: 2.07 mg/dL — ABNORMAL HIGH (ref 0.61–1.24)
GFR, Estimated: 33 mL/min — ABNORMAL LOW (ref >60)
Glucose, Bld: 103 mg/dL — ABNORMAL HIGH (ref 70–99)
Potassium: 4.1 mmol/L (ref 3.5–5.1)
Sodium: 130 mmol/L — ABNORMAL LOW (ref 135–145)

## 2021-05-27 LAB — TROPONIN I (HIGH SENSITIVITY)
Troponin I (High Sensitivity): 5 ng/L (ref <18)
Troponin I (High Sensitivity): 5 ng/L (ref <18)

## 2021-05-27 LAB — PROTIME-INR
INR: 1.5 — ABNORMAL HIGH (ref 0.8–1.2)
Prothrombin Time: 18.5 seconds — ABNORMAL HIGH (ref 11.4–15.2)

## 2021-05-27 LAB — CBG MONITORING, ED: Glucose-Capillary: 102 mg/dL — ABNORMAL HIGH (ref 70–99)

## 2021-05-27 LAB — TSH: TSH: 7.004 u[IU]/mL — ABNORMAL HIGH (ref 0.350–4.500)

## 2021-05-27 LAB — T4, FREE: Free T4: 1.02 ng/dL (ref 0.61–1.12)

## 2021-05-27 LAB — APTT: aPTT: 31 seconds (ref 24–36)

## 2021-05-27 MED ORDER — ENSURE ENLIVE PO LIQD
237.0000 mL | Freq: Two times a day (BID) | ORAL | Status: DC
  Administered 2021-05-28 – 2021-06-03 (×10): 237 mL via ORAL
  Filled 2021-05-27: qty 237

## 2021-05-27 MED ORDER — ONDANSETRON HCL 4 MG/2ML IJ SOLN
4.0000 mg | Freq: Four times a day (QID) | INTRAMUSCULAR | Status: DC | PRN
  Administered 2021-05-31 – 2021-06-01 (×2): 4 mg via INTRAVENOUS
  Filled 2021-05-27 (×2): qty 2

## 2021-05-27 MED ORDER — AEROCHAMBER PLUS FLO-VU LARGE MISC: 1.0000 | Freq: Once | Status: DC

## 2021-05-27 MED ORDER — ADULT MULTIVITAMIN W/MINERALS CH
1.0000 | ORAL_TABLET | Freq: Every day | ORAL | Status: DC
  Administered 2021-05-27 – 2021-06-03 (×8): 1 via ORAL
  Filled 2021-05-27 (×8): qty 1

## 2021-05-27 MED ORDER — LACTATED RINGERS IV BOLUS
500.0000 mL | Freq: Once | INTRAVENOUS | Status: AC
  Administered 2021-05-27: 500 mL via INTRAVENOUS

## 2021-05-27 MED ORDER — ALBUTEROL SULFATE HFA 108 (90 BASE) MCG/ACT IN AERS
2.0000 | INHALATION_SPRAY | RESPIRATORY_TRACT | Status: DC | PRN
  Filled 2021-05-27: qty 6.7

## 2021-05-27 MED ORDER — LACTULOSE 10 GM/15ML PO SOLN
20.0000 g | Freq: Two times a day (BID) | ORAL | Status: DC
  Administered 2021-05-27 – 2021-05-29 (×4): 20 g via ORAL
  Filled 2021-05-27 (×4): qty 30

## 2021-05-27 MED ORDER — ALBUTEROL SULFATE (2.5 MG/3ML) 0.083% IN NEBU
5.0000 mg | INHALATION_SOLUTION | Freq: Once | RESPIRATORY_TRACT | Status: AC
  Administered 2021-05-27: 5 mg via RESPIRATORY_TRACT
  Filled 2021-05-27: qty 6

## 2021-05-27 MED ORDER — PRAVASTATIN SODIUM 40 MG PO TABS
40.0000 mg | ORAL_TABLET | Freq: Every day | ORAL | Status: DC
  Administered 2021-05-28 – 2021-06-03 (×7): 40 mg via ORAL
  Filled 2021-05-27 (×7): qty 1

## 2021-05-27 MED ORDER — SODIUM CHLORIDE 0.9 % IV BOLUS
1000.0000 mL | Freq: Once | INTRAVENOUS | Status: AC
  Administered 2021-05-27: 1000 mL via INTRAVENOUS

## 2021-05-27 MED ORDER — ONDANSETRON HCL 4 MG PO TABS: 4.0000 mg | ORAL_TABLET | Freq: Four times a day (QID) | ORAL | Status: DC | PRN

## 2021-05-27 MED ORDER — LACTATED RINGERS IV SOLN: Freq: Once | INTRAVENOUS | Status: AC

## 2021-05-27 MED ORDER — LACTATED RINGERS IV BOLUS
500.0000 mL | Freq: Once | INTRAVENOUS | Status: AC
  Administered 2021-05-28: 500 mL via INTRAVENOUS

## 2021-05-27 NOTE — H&P (Signed)
History and Physical    BATU CASSIN BDZ:[id number] DOB: 23-Nov-1945 DOA: 05/27/2021  PCP: [name], MD  Patient coming from: Home  I have personally briefly reviewed patient's old medical records in [place]  Chief Complaint: Near syncope  HPI: Peter Nichols is a 75 y.o. male with medical history significant of HTN, HLD, prior EtOH abuse.  Pt recently admitted for hepatic encephalopathy and hyperammonemia in setting of EtOH cirrhosis.  Treated with lactulose.  Also had Lasix added as scheduled med and BP meds adjusted during that admit.  Pt discharged 8/6.  Has been taking Lactulose at least 2 times a day since discharge.  Not clear exactly which BP med list he is following (list that daughter has from DC summary is different than whats in computer today).  Regardless has been taking multiple BP meds + Lasix since discharge as well.  Pt with 2 episodes of near syncope at home today.  No injury during this: thankfully wife caught the patient, though unfortunately this caused wife to break her arm in the process (wife being seen separately in ED for broken arm).  EMS called: Initial BP on scene was in the 34H systolic.  Pt started on Epi GTT.   ED Course: On arrival to ED, pt given 1L IVF bolus, Epi drip was able to be discontinued and SBP in 90s very rapidly.  No tachycardia, no SIRS.  WBC nl.  Creat 2.0 up from 0.8 on 8/5.  HGB 10.9 up from 10.1 on the 5th, no stigmata of GIB (actually no BMs for past day or so despite lactulose).  Pt denies: fever, chills, N/V/D, abd pain, chest pain, rash or skin lesions, cough, SOB, headache.   Review of Systems: As per HPI, otherwise all review of systems negative.  Past Medical History:  Diagnosis Date   Dysphagia    Hyperlipidemia    Hypertension    Neuromuscular disorder ([place])    tremor   Steatohepatitis, non-alcoholic    Thrombocytopenia ([place])     Past Surgical History:  Procedure Laterality Date    CHOLECYSTECTOMY       reports that he has never smoked. He has never used smokeless tobacco. He reports previous alcohol use. He reports that he does not use drugs.  No Known Allergies  Family History  Problem Relation Age of Onset   Cancer Mother      Prior to Admission medications   Medication Sig Start Date End Date Taking? Authorizing Provider  amLODipine (NORVASC) 5 MG tablet Take 5 mg by mouth daily.  08/31/19  Yes [provider]  benazepril (LOTENSIN) 20 MG tablet Take 20 mg by mouth daily.  08/31/19  Yes [provider]  lactulose (CHRONULAC) 10 GM/15ML solution Take 30 mLs (20 g total) by mouth 2 (two) times daily. Increase accordingly up to 45 mLs (30 gms total) and/or up to 3 x day if having worsening confusion 05/24/21  Yes Annita Brod, MD  Multiple Vitamin (MULTIVITAMIN WITH MINERALS) TABS tablet Take 1 tablet by mouth daily. 05/25/21  Yes Annita Brod, MD  potassium chloride SA (KLOR-CON) 20 MEQ tablet Take 20 mEq by mouth daily.   Yes [provider]  pravastatin (PRAVACHOL) 40 MG tablet Take 40 mg by mouth daily.  08/31/19  Yes [provider]  propranolol ER (INDERAL LA) 60 MG 24 hr capsule Take 60 mg by mouth daily.   Yes [provider]  feeding supplement (ENSURE ENLIVE / ENSURE PLUS)  LIQD Take 237 mLs by mouth 2 (two) times daily between meals. 05/24/21   Annita Brod, MD  furosemide (LASIX) 40 MG tablet Take 1 tablet (40 mg total) by mouth daily. 05/25/21   Annita Brod, MD    Physical Exam: Vitals:   05/27/21 2028 05/27/21 2130 05/27/21 2245 05/27/21 2330  BP:   (!) 112/52 (!) 94/42  Pulse:   (!) 51 (!) 57  Resp:   20 18  Temp:      TempSrc:      SpO2: 98% 97% 99% 97%  Weight:      Height:        Constitutional: NAD, calm, comfortable Eyes: PERRL, lids and conjunctivae normal ENMT: Mucous membranes are moist. Posterior pharynx clear of any exudate or lesions.Normal dentition.  Neck: normal,  supple, no masses, no thyromegaly Respiratory: clear to auscultation bilaterally, no wheezing, no crackles. Normal respiratory effort. No accessory muscle use.  Cardiovascular: Regular rate and rhythm, no murmurs / rubs / gallops. No extremity edema. 2+ pedal pulses. No carotid bruits.  Abdomen: no tenderness, no masses palpated. No hepatosplenomegaly. Bowel sounds positive.  Musculoskeletal: no clubbing / cyanosis. No joint deformity upper and lower extremities. Good ROM, no contractures. Normal muscle tone.  Skin: no rashes, lesions, ulcers. No induration Neurologic: CN 2-12 grossly intact. Sensation intact, DTR normal. Strength 5/5 in all 4.  Psychiatric: Normal judgment and insight. Alert and oriented x 3. Normal mood.    Labs on Admission: I have personally reviewed following labs and imaging studies  CBC: Recent Labs  Lab 05/22/21 1426 05/23/21 0340 05/27/21 1634  WBC 6.6 4.7 6.9  NEUTROABS  --   --  3.5  HGB 11.9* 10.1* 10.9*  HCT 34.5* 29.3* 31.0*  MCV 100.0 99.7 100.0  PLT 123* 95* 010*   Basic Metabolic Panel: Recent Labs  Lab 05/22/21 1426 05/23/21 0340 05/27/21 1643  NA 135 136 130*  K 5.0 3.8 4.1  CL 105 111 102  CO2 23 22 23   GLUCOSE 133* 106* 103*  BUN 20 16 25*  CREATININE 1.04 0.87 2.07*  CALCIUM 9.0 8.7* 8.1*   GFR: Estimated Creatinine Clearance: 34 mL/min (A) (by C-G formula based on SCr of 2.07 mg/dL (H)). Liver Function Tests: Recent Labs  Lab 05/22/21 1426 05/23/21 0340 05/27/21 1643  AST 59* 40 40  ALT 47* 39 36  ALKPHOS 66 50 58  BILITOT 4.0* 3.5* 3.4*  PROT 6.2* 5.5* 5.3*  ALBUMIN 3.1* 2.7* 2.5*   No results for input(s): LIPASE, AMYLASE in the last 168 hours. Recent Labs  Lab 05/22/21 1427  AMMONIA 136*   Coagulation Profile: Recent Labs  Lab 05/27/21 1634  INR 1.5*   Cardiac Enzymes: No results for input(s): CKTOTAL, CKMB, CKMBINDEX, TROPONINI in the last 168 hours. BNP (last 3 results) No results for input(s): PROBNP  in the last 8760 hours. HbA1C: No results for input(s): HGBA1C in the last 72 hours. CBG: Recent Labs  Lab 05/27/21 1628  GLUCAP 102*   Lipid Profile: No results for input(s): CHOL, HDL, LDLCALC, TRIG, CHOLHDL, LDLDIRECT in the last 72 hours. Thyroid Function Tests: Recent Labs    05/27/21 1643 05/27/21 1705  TSH 7.004*  --   FREET4  --  1.02   Anemia Panel: No results for input(s): VITAMINB12, FOLATE, FERRITIN, TIBC, IRON, RETICCTPCT in the last 72 hours. Urine analysis:    Component Value Date/Time   COLORURINE AMBER (A) 05/23/2021 [place] 05/23/2021 1257  LABSPEC 1.021 05/23/2021 1257   PHURINE 5.0 05/23/2021 1257   GLUCOSEU NEGATIVE 05/23/2021 [place] 05/23/2021 [place] 05/23/2021 [place] 05/23/2021 [place] 05/23/2021 1257   UROBILINOGEN 1.0 11/27/2007 1029   NITRITE NEGATIVE 05/23/2021 [place] 05/23/2021 1257    Radiological Exams on Admission: DG Chest Portable 1 View  Result Date: 05/27/2021 CLINICAL DATA:  Near syncope EXAM: PORTABLE CHEST 1 VIEW COMPARISON:  11/28/2007 FINDINGS: The heart size and mediastinal contours are within normal limits. Both lungs are clear. The visualized skeletal structures are unremarkable. IMPRESSION: No active disease. Electronically Signed   By: Donavan Foil M.D.   On: 05/27/2021 16:11    EKG: Independently reviewed.  Assessment/Plan Principal Problem:   AKI (acute kidney injury) ([place]) Active Problems:   Hyperlipidemia   Hypertension   Alcoholic cirrhosis of liver with ascites ([place])   Near syncope    AKI - Suspect pre-renal due to hypotension Hypotension - Initially improved after 1L bolus Got another 500cc bolus after BP started to trend down, now giving 2nd 500cc bolus (total bolus volume thus far will be 2L after that +100cc/hr for a 3rd L overnight Suspect a combo of over diuresis from diuretics and recent  BP med changes are most likely to blame. No SIRS Ordering procalcitonin But Id like to see a fever, tachycardia, an elevated WBC, an elevated procalcitonin, abd pain, or SOMETHING more before I call it sepsis and throw him on ABx (At this point I still suspect that dehydration from diuretics + BP med changes are the most likely cause). Hold all home BP meds Actually there is discrepancy between computer list and discharge summary / home med rec as far as what he is actually taking: BB on Computer list but not on home med list with family member Chlorthalidone on DC summary and home med list but not whats currently in computer Hold diuretics (lasix, and not clear if he is or isnt on chlorthalidone on this point as above, but hold that as well). Strict intake and output Repeat CMP in AM HLD - cont statin EtOH cirrhosis with ascites - As above, holding diuretics, giving IVF Cont lactulose     DVT prophylaxis: SCDs - INR = 1.5, borderline thrombocytopenia Code Status: Full Family Communication: Daughter at bedside Disposition Plan: Home after AKI resolved and BP improved Consults called: None Admission status: Place in 67    Karem Farha, [place] [facility]  How to contact the [facility] Attending or Consulting provider [place] or covering provider during after hours [place], for this patient?  Check the care team in [facility] and look for a) attending/consulting [facility] provider listed and b) the [facility] team listed Log into [email or web address]  Amion Physician Scheduling and messaging for groups and whole hospitals  On call and physician scheduling software for group practices, residents, hospitalists and other medical providers for call, clinic, rotation and shift schedules. OnCall [employer] is a hospital-wide system for scheduling doctors and paging doctors on call. EasyPlot is for scientific plotting and data analysis.  [email or web address]  and use [place] universal password to access. If you do  not have the password, please contact the hospital operator.  Locate the [facility] provider you are looking for under [facility] and page to a number that you can be directly reached. If you still have difficulty reaching the provider, please page the [facility] (Director on Call) for the  Hospitalists listed on amion for assistance.  05/27/2021, 11:48 PM

## 2021-05-27 NOTE — ED Provider Notes (Signed)
  Physical Exam  BP (!) 95/55   Pulse (!) 52   Temp 98.2 F (36.8 C) (Oral)   Resp 20   Ht 1.753 m (5' 9" )   Wt 85.7 kg   SpO2 100%   BMI 27.91 kg/m   Physical Exam  ED Course/Procedures   Clinical Course as of 05/27/21 2023  Tue May 27, 2021  1537 Taken off of the epi drip.  Ordered 1 L bolus of fluid and will reevaluate.  Initial MAP off of the epi drip was 58.  After just a few moments of fluid resuscitation patient's map improved to 62.  We will continue to monitor closely.  I have highest suspicion for volume depletion [AH]    Clinical Course User Index [AH] Margarita Mail, PA-C    Procedures  MDM  75 yo male with hypotension, s/p paracentesis due to liver failure, presented on epi drip.  IV ns infused, epi off. Abdomen soft , nttp, AKI noted Suspect overdiuresis BP improved after iv fluids Discussed with Dr. Alcario Drought who will see for admission        Pattricia Boss, MD 05/27/21 2024

## 2021-05-27 NOTE — ED Triage Notes (Signed)
Pt had 2 pre-syncopal episodes today. Pt denies LOC for both, but did fall during both episodes. EMS arrived and pt's BP 60/30. Was given 500 cc NS bolus with no change so EMS started EPI gtt on pt.

## 2021-05-27 NOTE — ED Provider Notes (Signed)
[facility] EMERGENCY DEPARTMENT Provider Note   CSN: [id number] Arrival date & time: 05/27/21  1515     History Chief Complaint  Patient presents with   Near Syncope   Hypotension    Peter Nichols is a 75 y.o. male who presents emergency department with a chief complaint of near syncope.  He has a past medical history of hyperlipidemia, hypertension, alcoholic cirrhosis.  Patient was recently hospitalized for hepatic encephalopathy he was previously on diuretics but this was stopped prior to his recent admission because his wife thought it might be contributing to his confusion.  During his hospitalization he had 2 L of fluid removed by paracentesis.  The patient arrives by EMS today for 2 episodes of near syncope.  He had 1 at the pool at which his wife tried to catch him and ended up breaking her wrist.  The patient also was driven home states that trying to get into the house he could barely make it.  EMS reports that when they found him he was hypotensive with systolic pressures in the 60s, he did not improve despite 500 mL of normal saline and was placed on an epi drip with improvement of his blood pressure to 665 systolic.  Patient states that he has had previous episodes like this.  He did take all of his blood pressure medications this morning.  He denies racing heart, chest pain, palpitations, nausea, vomiting, diaphoresis.  He does not take a blood thinner and denies melena or hematochezia.  Near Syncope      Past Medical History:  Diagnosis Date   Dysphagia    Hyperlipidemia    Hypertension    Neuromuscular disorder ([place])    tremor   Steatohepatitis, non-alcoholic    Thrombocytopenia ([place])     Patient Active Problem List   Diagnosis Date Noted   AKI (acute kidney injury) ([place]) 05/27/2021   Near syncope 05/27/2021   Steatohepatitis, non-alcoholic    Hyperlipidemia    Hypertension    Overweight (BMI 25.0-29.9)    Alcoholic cirrhosis of liver with  ascites ([place])    Acute hepatic encephalopathy 05/22/2021   Thrombocytopenia ([place]) 05/04/2017    Past Surgical History:  Procedure Laterality Date   CHOLECYSTECTOMY         Family History  Problem Relation Age of Onset   Cancer Mother     Social History   Tobacco Use   Smoking status: Never   Smokeless tobacco: Never  Vaping Use   Vaping Use: Never used  Substance Use Topics   Alcohol use: Not Currently   Drug use: Never    Home Medications Prior to Admission medications   Medication Sig Start Date End Date Taking? Authorizing Provider  amLODipine (NORVASC) 5 MG tablet Take 5 mg by mouth daily.  08/31/19  Yes [provider]  benazepril (LOTENSIN) 20 MG tablet Take 20 mg by mouth daily.  08/31/19  Yes [provider]  lactulose (CHRONULAC) 10 GM/15ML solution Take 30 mLs (20 g total) by mouth 2 (two) times daily. Increase accordingly up to 45 mLs (30 gms total) and/or up to 3 x day if having worsening confusion 05/24/21  Yes Annita Brod, MD  Multiple Vitamin (MULTIVITAMIN WITH MINERALS) TABS tablet Take 1 tablet by mouth daily. 05/25/21  Yes Annita Brod, MD  potassium chloride SA (KLOR-CON) 20 MEQ tablet Take 20 mEq by mouth daily.   Yes [provider]  pravastatin (PRAVACHOL) 40 MG tablet Take 40  mg by mouth daily.  08/31/19  Yes [provider]  propranolol ER (INDERAL LA) 60 MG 24 hr capsule Take 60 mg by mouth daily.   Yes [provider]  feeding supplement (ENSURE ENLIVE / ENSURE PLUS) LIQD Take 237 mLs by mouth 2 (two) times daily between meals. 05/24/21   Annita Brod, MD  furosemide (LASIX) 40 MG tablet Take 1 tablet (40 mg total) by mouth daily. 05/25/21   Annita Brod, MD    Allergies    Patient has no known allergies.  Review of Systems   Review of Systems  Cardiovascular:  Positive for near-syncope.  Ten systems reviewed and are negative for acute change, except as noted in the HPI.    Physical Exam Updated Vital Signs BP (!) 110/58 (BP Location: Right Arm)   Pulse (!) 50   Temp 97.7 F (36.5 C) (Oral)   Resp 18   Ht 5' 9"  (1.753 m)   Wt 92 kg   SpO2 100%   BMI 29.95 kg/m   Physical Exam Vitals and nursing note reviewed.  Constitutional:      General: He is not in acute distress.    Appearance: He is well-developed. He is not diaphoretic.  HENT:     Head: Normocephalic and atraumatic.  Eyes:     General: Scleral icterus present.     Extraocular Movements: Extraocular movements intact.     Conjunctiva/sclera: Conjunctivae normal.     Pupils: Pupils are equal, round, and reactive to light.  Cardiovascular:     Rate and Rhythm: Normal rate and regular rhythm.     Heart sounds: Normal heart sounds.  Pulmonary:     Effort: Pulmonary effort is normal. No respiratory distress.     Breath sounds: Normal breath sounds.  Abdominal:     Palpations: Abdomen is soft.     Tenderness: There is no abdominal tenderness.  Musculoskeletal:     Cervical back: Normal range of motion and neck supple.  Skin:    General: Skin is warm and dry.     Coloration: Skin is jaundiced.  Neurological:     Mental Status: He is alert.  Psychiatric:        Behavior: Behavior normal.    ED Results / Procedures / Treatments   Labs (all labs ordered are listed, but only abnormal results are displayed) Labs Reviewed  CBC WITH DIFFERENTIAL/PLATELET - Abnormal; Notable for the following components:      Result Value   RBC 3.10 (*)    Hemoglobin 10.9 (*)    HCT 31.0 (*)    MCH 35.2 (*)    Platelets 115 (*)    All other components within normal limits  TSH - Abnormal; Notable for the following components:   TSH 7.004 (*)    All other components within normal limits  HEPATIC FUNCTION PANEL - Abnormal; Notable for the following components:   Total Protein 5.3 (*)    Albumin 2.5 (*)    Total Bilirubin 3.4 (*)    Bilirubin, Direct 0.7 (*)    Indirect Bilirubin 2.7 (*)    All  other components within normal limits  BASIC METABOLIC PANEL - Abnormal; Notable for the following components:   Sodium 130 (*)    Glucose, Bld 103 (*)    BUN 25 (*)    Creatinine, Ser 2.07 (*)    Calcium 8.1 (*)    GFR, Estimated 33 (*)    All other components within normal limits  PROTIME-INR - Abnormal; Notable for the following components:   Prothrombin Time 18.5 (*)    INR 1.5 (*)    All other components within normal limits  CBC - Abnormal; Notable for the following components:   RBC 2.73 (*)    Hemoglobin 9.4 (*)    HCT 27.1 (*)    MCH 34.4 (*)    Platelets 87 (*)    All other components within normal limits  AMMONIA - Abnormal; Notable for the following components:   Ammonia 57 (*)    All other components within normal limits  COMPREHENSIVE METABOLIC PANEL - Abnormal; Notable for the following components:   Sodium 130 (*)    CO2 21 (*)    Glucose, Bld 115 (*)    BUN 26 (*)    Creatinine, Ser 1.85 (*)    Calcium 7.8 (*)    Total Protein 4.4 (*)    Albumin 2.1 (*)    Total Bilirubin 2.4 (*)    GFR, Estimated 38 (*)    All other components within normal limits  CBG MONITORING, ED - Abnormal; Notable for the following components:   Glucose-Capillary 102 (*)    All other components within normal limits  SARS CORONAVIRUS 2 (TAT 6-24 HRS)  APTT  T4, FREE  PROCALCITONIN  AMMONIA  LACTIC ACID, PLASMA  BASIC METABOLIC PANEL  TROPONIN I (HIGH SENSITIVITY)  TROPONIN I (HIGH SENSITIVITY)    EKG EKG Interpretation  Date/Time:  Tuesday May 27 2021 15:20:52 EDT Ventricular Rate:  52 PR Interval:  220 QRS Duration: 105 QT Interval:  465 QTC Calculation: 433 R Axis:   8 Text Interpretation: Sinus rhythm Prolonged PR interval Low voltage, extremity and precordial leads Confirmed by Pattricia Boss [phone]) on 05/27/2021 6:54:15 PM  Radiology DG Chest Portable 1 View  Result Date: 05/27/2021 CLINICAL DATA:  Near syncope EXAM: PORTABLE CHEST 1 VIEW COMPARISON:   11/28/2007 FINDINGS: The heart size and mediastinal contours are within normal limits. Both lungs are clear. The visualized skeletal structures are unremarkable. IMPRESSION: No active disease. Electronically Signed   By: Donavan Foil M.D.   On: 05/27/2021 16:11    Procedures Procedures   Medications Ordered in ED Medications  AeroChamber Plus Flo-Vu Large MISC 1 each (1 each Other Not Given 05/27/21 2246)  feeding supplement (ENSURE ENLIVE / ENSURE PLUS) liquid 237 mL (237 mLs Oral Given 05/28/21 1700)  lactulose (CHRONULAC) 10 GM/15ML solution 20 g (20 g Oral Given 05/28/21 1033)  multivitamin with minerals tablet 1 tablet (1 tablet Oral Given 05/28/21 1032)  pravastatin (PRAVACHOL) tablet 40 mg (40 mg Oral Given 05/28/21 1032)  ondansetron (ZOFRAN) tablet 4 mg (has no administration in time range)    Or  ondansetron (ZOFRAN) injection 4 mg (has no administration in time range)  albuterol (PROVENTIL) (2.5 MG/3ML) 0.083% nebulizer solution 2.5 mg (has no administration in time range)  sodium chloride 0.9 % bolus 1,000 mL (0 mLs Intravenous Stopped 05/27/21 1641)  albuterol (PROVENTIL) (2.5 MG/3ML) 0.083% nebulizer solution 5 mg (5 mg Nebulization Given 05/27/21 2026)  lactated ringers infusion ( Intravenous Stopped 05/28/21 0250)  lactated ringers bolus 500 mL (0 mLs Intravenous Stopped 05/28/21 0004)  lactated ringers bolus 500 mL (0 mLs Intravenous Stopped 05/28/21 0035)    ED Course  I have reviewed the triage vital signs and the nursing notes.  Pertinent labs & imaging results that were available during my care of the patient were reviewed by me and considered in my medical decision making (see  chart for details).  Clinical Course as of 05/28/21 1846  Tue May 27, 2021  1537 Taken off of the epi drip.  Ordered 1 L bolus of fluid and will reevaluate.  Initial MAP off of the epi drip was 58.  After just a few moments of fluid resuscitation patient's map improved to 62.  We will continue to monitor  closely.  I have highest suspicion for volume depletion [AH]    Clinical Course User Index [AH] Margarita Mail, PA-C   MDM Rules/Calculators/A&P                           RD:EYCX syncope  KG:YJEHUDJ is gathered by patient  and daughter. Previous records obtained and reviewed. DDX:The patient's complaint of near syncope involves an extensive number of diagnostic and treatment options, and is a complaint that carries with it a high risk of complications, morbidity, and potential mortality. Given the large differential diagnosis, medical decision making is of high complexity. The differential for syncope is extensive and includes, but is not limited to: arrythmia (Vtach, SVT, SSS, sinus arrest, AV block, bradycardia) aortic stenosis, AMI, HOCM, PE, atrial myxoma, pulmonary hypertension, orthostatic hypotension, (hypovolemia, drug effect, GB syndrome, micturition, cough, swall) carotid sinus sensitivity, Seizure, TIA/CVA, hypoglycemia,  Vertigo.  Labs: I ordered reviewed and interpreted labs which include Cbc with mild anemia and thrombocytopenia TSH markedly elevated. BMP with AKI- cr up from 0.87-2.07, Troponin is within normal limits, PT/INR mildly elevated Imaging: I ordered and reviewed images which included an CT head. I independently visualized and interpreted the images. There are no acute, significant findings on today's images. EKG: Sinus rhythm at a rate of 52 without arrhythmia Consults: MDM: Patient here with near syncope.  Review of labs indicates that the patient has significant dehydration likely due to overdiuresis given his recent start with Lasix.  He does have an AKI.  Patient will need admission for AKI, dehydration hypertension.  I discussed all findings and answered all questions to the best of my ability. Patient disposition:The patient appears reasonably stabilized for admission considering the current resources, flow, and capabilities available in the ED at this time, and  I doubt any other [facility] requiring further screening and/or treatment in the ED prior to admission.         Final Clinical Impression(s) / ED Diagnoses Final diagnoses:  AKI (acute kidney injury) ([place])  Near syncope    Rx / DC Orders ED Discharge Orders     None        Margarita Mail, PA-C 05/28/21 1846    Jeanell Sparrow, DO 05/29/21 0024

## 2021-05-28 DIAGNOSIS — N179 Acute kidney failure, unspecified: Secondary | ICD-10-CM | POA: Diagnosis not present

## 2021-05-28 DIAGNOSIS — E785 Hyperlipidemia, unspecified: Secondary | ICD-10-CM | POA: Diagnosis not present

## 2021-05-28 DIAGNOSIS — K7031 Alcoholic cirrhosis of liver with ascites: Secondary | ICD-10-CM | POA: Diagnosis not present

## 2021-05-28 DIAGNOSIS — I10 Essential (primary) hypertension: Secondary | ICD-10-CM | POA: Diagnosis not present

## 2021-05-28 LAB — AMMONIA: Ammonia: 57 umol/L — ABNORMAL HIGH (ref 9–35)

## 2021-05-28 LAB — CBC
HCT: 27.1 % — ABNORMAL LOW (ref 39.0–52.0)
Hemoglobin: 9.4 g/dL — ABNORMAL LOW (ref 13.0–17.0)
MCH: 34.4 pg — ABNORMAL HIGH (ref 26.0–34.0)
MCHC: 34.7 g/dL (ref 30.0–36.0)
MCV: 99.3 fL (ref 80.0–100.0)
Platelets: 87 10*3/uL — ABNORMAL LOW (ref 150–400)
RBC: 2.73 MIL/uL — ABNORMAL LOW (ref 4.22–5.81)
RDW: 14.3 % (ref 11.5–15.5)
WBC: 6.3 10*3/uL (ref 4.0–10.5)
nRBC: 0 % (ref 0.0–0.2)

## 2021-05-28 LAB — COMPREHENSIVE METABOLIC PANEL
ALT: 30 U/L (ref 0–44)
AST: 36 U/L (ref 15–41)
Albumin: 2.1 g/dL — ABNORMAL LOW (ref 3.5–5.0)
Alkaline Phosphatase: 47 U/L (ref 38–126)
Anion gap: 7 (ref 5–15)
BUN: 26 mg/dL — ABNORMAL HIGH (ref 8–23)
CO2: 21 mmol/L — ABNORMAL LOW (ref 22–32)
Calcium: 7.8 mg/dL — ABNORMAL LOW (ref 8.9–10.3)
Chloride: 102 mmol/L (ref 98–111)
Creatinine, Ser: 1.85 mg/dL — ABNORMAL HIGH (ref 0.61–1.24)
GFR, Estimated: 38 mL/min — ABNORMAL LOW (ref >60)
Glucose, Bld: 115 mg/dL — ABNORMAL HIGH (ref 70–99)
Potassium: 3.8 mmol/L (ref 3.5–5.1)
Sodium: 130 mmol/L — ABNORMAL LOW (ref 135–145)
Total Bilirubin: 2.4 mg/dL — ABNORMAL HIGH (ref 0.3–1.2)
Total Protein: 4.4 g/dL — ABNORMAL LOW (ref 6.5–8.1)

## 2021-05-28 LAB — PROCALCITONIN: Procalcitonin: <0.1 ng/mL

## 2021-05-28 MED ORDER — LACTATED RINGERS IV SOLN: INTRAVENOUS | Status: DC

## 2021-05-28 MED ORDER — ALBUTEROL SULFATE (2.5 MG/3ML) 0.083% IN NEBU: 2.5000 mg | INHALATION_SOLUTION | RESPIRATORY_TRACT | Status: DC | PRN

## 2021-05-28 NOTE — Progress Notes (Signed)
PROGRESS NOTE    Peter Nichols  PFX:[id number] DOB: 21-Aug-1946 DOA: 05/27/2021 PCP: [name], MD    Chief Complaint  Patient presents with   Near Syncope   Hypotension    Brief Narrative:   Peter Nichols is a 75 y.o. male with medical history significant of HTN, HLD, prior EtOH abuse.  Pt recently admitted for hepatic encephalopathy and hyperammonemia in setting of EtOH cirrhosis.  Treated with lactulose and discharged home on 05/24/21. Presents to ED yesterday for recurrent near syncope.   Assessment & Plan:   Principal Problem:   AKI (acute kidney injury) ([place]) Active Problems:   Hyperlipidemia   Hypertension   Alcoholic cirrhosis of liver with ascites ([place])   Near syncope   Near syncope probably secondary to hypotension.  Probably secondary to over diuresis , with addition of lasix , norvasc, lotensin, , propranolol and use of chlorthalidone.  All BP meds and diuretics discontinued.  Pt received about 2 lit of fluids so far, and was started on RL at 171m/hr.  BP normalized.  Will stop fluids today and monitor BP overnight .  Get therapy evaluations and check orthostatic vs.  EKG shows sinus rhythm.  Pt is currently asymptomatic, denies any dizziness, headache.   AKI  Probably from hypovolemia and dehydration.  Slowly improving. Recheck renal parameters in am.    Liver cirrhosis from alcohol use with ascites.  Diuretics on hold for hypotension and AKI.    Hepatic encephalopathy:  Appears to have resolved.  Ammonia level slightly elevated in 50's  Continue with lactulose.  He is alert and oriented and answering all questions appropriately.  Recheck ammonia as per patient's request.    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  Monitor counts.     Hyperlipidemia:  Resume statin.    Hyponatremia:  Suspect probably from dehydration and RL fluids.  Recheck sodium in am.  TSH  elevated but free t4 wnl.  Will check urine sodium, urine osmo and serum osmo in am if persistent hyponatremic.     DVT prophylaxis: SCD's Code Status: (Full Code) Family Communication: none at bedside.  Disposition:   Status is: Observation  The patient will require care spanning > 2 midnights and should be moved to inpatient because: Unsafe d/c plan  Dispo: The patient is from: Home              Anticipated d/c is to: Home              Patient currently is medically stable to d/c.   Difficult to place patient No       Consultants:  None.   Procedures: None.   Antimicrobials: None.    Subjective: No new complaints.   Objective: Vitals:   05/28/21 1045 05/28/21 1315 05/28/21 1434 05/28/21 1547  BP: (!) 110/58 115/63 (!) 111/56 (!) 110/58  Pulse: (!) 51 (!) 50 (!) 51 (!) 50  Resp: (!) 23 20 18 18   Temp:    97.7 F (36.5 C)  TempSrc:    Oral  SpO2: 100% 97% 100% 100%  Weight:    92 kg  Height:    5' 9"  (1.753 m)    Intake/Output Summary (Last 24 hours) at 05/28/2021 1800 Last data filed at 05/28/2021 1353 Gross per 24 hour  Intake 2154.09 ml  Output 150 ml  Net 2004.09 ml   Filed Weights   05/27/21 1524 05/28/21 1547  Weight: 85.7 kg 92 kg    Examination:  General exam: Appears calm and comfortable  Respiratory system: Clear to auscultation. Respiratory effort normal. Cardiovascular system: S1 & S2 heard, RRR. No JVD, No pedal edema. Gastrointestinal system: Abdomen is soft, distended, non tender. Normal bowel sounds heard. Central nervous system: Alert and oriented. No focal neurological deficits. Extremities: Symmetric 5 x 5 power. Skin: No rashes seen.  Psychiatry:  Mood & affect appropriate.     Data Reviewed: I have personally reviewed following labs and imaging studies  CBC: Recent Labs  Lab 05/22/21 1426 05/23/21 0340 05/27/21 1634 05/28/21 0401  WBC 6.6 4.7 6.9 6.3  NEUTROABS  --   --  3.5  --   HGB 11.9* 10.1* 10.9* 9.4*  HCT  34.5* 29.3* 31.0* 27.1*  MCV 100.0 99.7 100.0 99.3  PLT 123* 95* 115* 87*    Basic Metabolic Panel: Recent Labs  Lab 05/22/21 1426 05/23/21 0340 05/27/21 1643 05/28/21 0401  NA 135 136 130* 130*  K 5.0 3.8 4.1 3.8  CL 105 111 102 102  CO2 23 22 23  21*  GLUCOSE 133* 106* 103* 115*  BUN 20 16 25* 26*  CREATININE 1.04 0.87 2.07* 1.85*  CALCIUM 9.0 8.7* 8.1* 7.8*    GFR: Estimated Creatinine Clearance: 39.2 mL/min (A) (by C-G formula based on SCr of 1.85 mg/dL (H)).  Liver Function Tests: Recent Labs  Lab 05/22/21 1426 05/23/21 0340 05/27/21 1643 05/28/21 0401  AST 59* 40 40 36  ALT 47* 39 36 30  ALKPHOS 66 50 58 47  BILITOT 4.0* 3.5* 3.4* 2.4*  PROT 6.2* 5.5* 5.3* 4.4*  ALBUMIN 3.1* 2.7* 2.5* 2.1*    CBG: Recent Labs  Lab 05/27/21 1628  GLUCAP 102*     Recent Results (from the past 240 hour(s))  Resp Panel by RT-PCR (Flu A&B, Covid) Nasopharyngeal Swab     Status: None   Collection Time: 05/22/21  3:00 PM   Specimen: Nasopharyngeal Swab; Nasopharyngeal(NP) swabs in vial transport medium  Result Value Ref Range Status   SARS Coronavirus 2 by RT PCR NEGATIVE NEGATIVE Final    Comment: (NOTE) SARS-CoV-2 target nucleic acids are NOT DETECTED.  The SARS-CoV-2 RNA is generally detectable in upper respiratory specimens during the acute phase of infection. The lowest concentration of SARS-CoV-2 viral copies this assay can detect is 138 copies/mL. A negative result does not preclude SARS-Cov-2 infection and should not be used as the sole basis for treatment or other patient management decisions. A negative result may occur with  improper specimen collection/handling, submission of specimen other than nasopharyngeal swab, presence of viral mutation(s) within the areas targeted by this assay, and inadequate number of viral copies(<138 copies/mL). A negative result must be combined with clinical observations, patient history, and epidemiological information. The  expected result is Negative.  Fact Sheet for Patients:  EntrepreneurPulse.com.au  Fact Sheet for Healthcare Providers:  IncredibleEmployment.be  This test is no t yet approved or cleared by the [place] FDA and  has been authorized for detection and/or diagnosis of SARS-CoV-2 by FDA under an Emergency Use Authorization (EUA). This EUA will remain  in effect (meaning this test can be used) for the duration of the COVID-19 declaration under Section 564(b)(1) of the Act, 21 U.S.C.section 360bbb-3(b)(1), unless the authorization is terminated  or revoked sooner.       Influenza A by PCR NEGATIVE NEGATIVE Final   Influenza B by PCR NEGATIVE NEGATIVE Final    Comment: (NOTE) The Xpert  Xpress SARS-CoV-2/FLU/RSV plus assay is intended as an aid in the diagnosis of influenza from Nasopharyngeal swab specimens and should not be used as a sole basis for treatment. Nasal washings and aspirates are unacceptable for Xpert Xpress SARS-CoV-2/FLU/RSV testing.  Fact Sheet for Patients: EntrepreneurPulse.com.au  Fact Sheet for Healthcare Providers: IncredibleEmployment.be  This test is not yet approved or cleared by the [place] FDA and has been authorized for detection and/or diagnosis of SARS-CoV-2 by FDA under an Emergency Use Authorization (EUA). This EUA will remain in effect (meaning this test can be used) for the duration of the COVID-19 declaration under Section 564(b)(1) of the Act, 21 U.S.C. section 360bbb-3(b)(1), unless the authorization is terminated or revoked.  Performed at [facility], [place] [address] [place], [place] [address]   Body fluid culture w Gram Stain     Status: None   Collection Time: 05/22/21  4:03 PM   Specimen: PATH Cytology Peritoneal fluid  Result Value Ref Range Status   Specimen Description   Final    PERITONEAL Performed at [facility] [address], [place], [place] [address]    Special Requests   Final    NONE Performed at [facility], [place] [address] [place], [place] [address]    Gram Stain   Final    WBC PRESENT, PREDOMINANTLY MONONUCLEAR NO ORGANISMS SEEN CYTOSPIN SMEAR    Culture   Final    NO GROWTH Performed at [facility], [place] [address] [place], [place] [address]    Report Status 05/26/2021 FINAL  Final         Radiology Studies: DG Chest Portable 1 View  Result Date: 05/27/2021 CLINICAL DATA:  Near syncope EXAM: PORTABLE CHEST 1 VIEW COMPARISON:  11/28/2007 FINDINGS: The heart size and mediastinal contours are within normal limits. Both lungs are clear. The visualized skeletal structures are unremarkable. IMPRESSION: No active disease. Electronically Signed   By: Donavan Foil M.D.   On: 05/27/2021 16:11        Scheduled Meds:  AeroChamber Plus Flo-Vu Large  1 each Other Once   feeding supplement  237 mL Oral BID BM   lactulose  20 g Oral BID   multivitamin with minerals  1 tablet Oral Daily   pravastatin  40 mg Oral Daily   Continuous Infusions:   LOS: 0 days        Hosie Poisson, MD [facility]   To contact the attending provider between 7A-7P or the covering provider during after hours 7P-7A, please log into the web site [email or web address] and access using universal [place] password for that web site. If you do not have the password, please call the hospital operator.  05/28/2021, 6:00 PM

## 2021-05-28 NOTE — Progress Notes (Signed)
BP 88/50, pt asymptomatic.  Procalcitonin is negative.  Will increase maint IVF rate to 125 cc/hr.  Pt with 2L total bolus volume at this point.  Pt remains asymptomatic.  Think BP meds remain likely culprit.  But given asymptomatic status, MAP of ~65, dont think he needs vasopressors / glucagon gtt at this point.

## 2021-05-28 NOTE — ED Notes (Signed)
Attempted report 

## 2021-05-29 DIAGNOSIS — R188 Other ascites: Secondary | ICD-10-CM | POA: Diagnosis not present

## 2021-05-29 DIAGNOSIS — E869 Volume depletion, unspecified: Secondary | ICD-10-CM | POA: Diagnosis present

## 2021-05-29 DIAGNOSIS — K7581 Nonalcoholic steatohepatitis (NASH): Secondary | ICD-10-CM | POA: Diagnosis present

## 2021-05-29 DIAGNOSIS — E860 Dehydration: Secondary | ICD-10-CM | POA: Diagnosis present

## 2021-05-29 DIAGNOSIS — E785 Hyperlipidemia, unspecified: Secondary | ICD-10-CM | POA: Diagnosis present

## 2021-05-29 DIAGNOSIS — K859 Acute pancreatitis without necrosis or infection, unspecified: Secondary | ICD-10-CM | POA: Diagnosis not present

## 2021-05-29 DIAGNOSIS — I10 Essential (primary) hypertension: Secondary | ICD-10-CM | POA: Diagnosis present

## 2021-05-29 DIAGNOSIS — K7031 Alcoholic cirrhosis of liver with ascites: Secondary | ICD-10-CM | POA: Diagnosis present

## 2021-05-29 DIAGNOSIS — Z20822 Contact with and (suspected) exposure to covid-19: Secondary | ICD-10-CM | POA: Diagnosis present

## 2021-05-29 DIAGNOSIS — D6959 Other secondary thrombocytopenia: Secondary | ICD-10-CM | POA: Diagnosis present

## 2021-05-29 DIAGNOSIS — I959 Hypotension, unspecified: Secondary | ICD-10-CM | POA: Diagnosis present

## 2021-05-29 DIAGNOSIS — E875 Hyperkalemia: Secondary | ICD-10-CM | POA: Diagnosis not present

## 2021-05-29 DIAGNOSIS — D638 Anemia in other chronic diseases classified elsewhere: Secondary | ICD-10-CM | POA: Diagnosis present

## 2021-05-29 DIAGNOSIS — R55 Syncope and collapse: Secondary | ICD-10-CM | POA: Diagnosis not present

## 2021-05-29 DIAGNOSIS — E8770 Fluid overload, unspecified: Secondary | ICD-10-CM | POA: Diagnosis not present

## 2021-05-29 DIAGNOSIS — E871 Hypo-osmolality and hyponatremia: Secondary | ICD-10-CM | POA: Diagnosis present

## 2021-05-29 DIAGNOSIS — Z79899 Other long term (current) drug therapy: Secondary | ICD-10-CM | POA: Diagnosis not present

## 2021-05-29 DIAGNOSIS — N179 Acute kidney failure, unspecified: Secondary | ICD-10-CM | POA: Diagnosis present

## 2021-05-29 DIAGNOSIS — E861 Hypovolemia: Secondary | ICD-10-CM | POA: Diagnosis present

## 2021-05-29 DIAGNOSIS — Z809 Family history of malignant neoplasm, unspecified: Secondary | ICD-10-CM | POA: Diagnosis not present

## 2021-05-29 DIAGNOSIS — T502X5A Adverse effect of carbonic-anhydrase inhibitors, benzothiadiazides and other diuretics, initial encounter: Secondary | ICD-10-CM | POA: Diagnosis not present

## 2021-05-29 LAB — BASIC METABOLIC PANEL
Anion gap: 6 (ref 5–15)
BUN: 28 mg/dL — ABNORMAL HIGH (ref 8–23)
CO2: 24 mmol/L (ref 22–32)
Calcium: 8.5 mg/dL — ABNORMAL LOW (ref 8.9–10.3)
Chloride: 101 mmol/L (ref 98–111)
Creatinine, Ser: 1.66 mg/dL — ABNORMAL HIGH (ref 0.61–1.24)
GFR, Estimated: 43 mL/min — ABNORMAL LOW (ref >60)
Glucose, Bld: 123 mg/dL — ABNORMAL HIGH (ref 70–99)
Potassium: 4.8 mmol/L (ref 3.5–5.1)
Sodium: 131 mmol/L — ABNORMAL LOW (ref 135–145)

## 2021-05-29 LAB — SARS CORONAVIRUS 2 (TAT 6-24 HRS): SARS Coronavirus 2: NEGATIVE

## 2021-05-29 LAB — LACTIC ACID, PLASMA: Lactic Acid, Venous: 1.5 mmol/L (ref 0.5–1.9)

## 2021-05-29 LAB — AMMONIA: Ammonia: 71 umol/L — ABNORMAL HIGH (ref 9–35)

## 2021-05-29 MED ORDER — LACTULOSE 10 GM/15ML PO SOLN
20.0000 g | Freq: Three times a day (TID) | ORAL | Status: DC
  Administered 2021-05-29 – 2021-06-03 (×14): 20 g via ORAL
  Filled 2021-05-29 (×14): qty 30

## 2021-05-29 NOTE — Progress Notes (Signed)
Physical Therapy Treatment Patient Details Name: Peter Nichols MRN: [id number] DOB: 1946-09-28 Today's Date: 05/29/2021    History of Present Illness Peter Nichols is a 75 y.o. male with medical history significant of HTN, HLD, prior EtOH abuse.  Pt recently admitted for hepatic encephalopathy and hyperammonemia in setting of EtOH cirrhosis.  Treated with lactulose.  Also had Lasix added as scheduled med and BP meds adjusted during that admit.  Pt discharged 8/6. Has been taking Lactulose at least 2 times a day since discharge.  He has been taking multiple BP meds + Lasix since discharge as well. Pt with 2 episodes of near syncope at home today.  No injury during this: thankfully wife caught the patient, though unfortunately this caused wife to break her arm in the process (wife being seen separately in ED for broken arm).    PT Comments    Patient received in bed agreeable to PT session. He ambulated >200 feet to stairwell by elevators without ad, supervision and up/down 6 -7 steps with 1 rail and supervision. Patient is at baseline and does not need further PT. Signing off.      Follow Up Recommendations  No PT follow up     Equipment Recommendations  None recommended by PT    Recommendations for Other Services       Precautions / Restrictions Precautions Precautions: Fall Restrictions Weight Bearing Restrictions: No    Mobility  Bed Mobility Overal bed mobility: Independent                  Transfers Overall transfer level: Independent Equipment used: None                Ambulation/Gait Ambulation/Gait assistance: Supervision Gait Distance (Feet): 200 Feet Assistive device: None Gait Pattern/deviations: Step-through pattern Gait velocity: decr   General Gait Details: patient ambulating well in room without AD. No lob. No dizziness reported   Stairs Stairs: Yes Stairs assistance: Supervision Stair Management: One rail Left;Alternating  pattern Number of Stairs: 6 General stair comments: patient is safe on steps   Wheelchair Mobility    Modified Rankin (Stroke Patients Only)       Balance Overall balance assessment: No apparent balance deficits (not formally assessed)                                          Cognition Arousal/Alertness: Awake/alert Behavior During Therapy: WFL for tasks assessed/performed Overall Cognitive Status: Within Functional Limits for tasks assessed                                        Exercises      General Comments        Pertinent Vitals/Pain Pain Assessment: No/denies pain    Home Living Family/patient expects to be discharged to:: Private residence Living Arrangements: Spouse/significant other Available Help at Discharge: Family Type of Home: House Home Access: Stairs to enter   Home Layout: Two level;1/2 bath on main level;Bed/bath upstairs Home Equipment: Shower seat;Hand held shower head      Prior Function Level of Independence: Independent          PT Goals (current goals can now be found in the care plan section) Acute Rehab PT Goals Patient Stated Goal: go home PT Goal Formulation: With  patient Time For Goal Achievement: 05/31/21 Potential to Achieve Goals: Good    Frequency           PT Plan Current plan remains appropriate    Co-evaluation              AM-PAC PT "6 Clicks" Mobility   Outcome Measure  Help needed turning from your back to your side while in a flat bed without using bedrails?: None Help needed moving from lying on your back to sitting on the side of a flat bed without using bedrails?: None Help needed moving to and from a bed to a chair (including a wheelchair)?: None Help needed standing up from a chair using your arms (e.g., wheelchair or bedside chair)?: None Help needed to walk in hospital room?: None Help needed climbing 3-5 steps with a railing? : None 6 Click Score: 24     End of Session Equipment Utilized During Treatment: Gait belt Activity Tolerance: Patient tolerated treatment well Patient left: in bed;with call bell/phone within reach;with bed alarm set Nurse Communication: Mobility status PT Visit Diagnosis: Difficulty in walking, not elsewhere classified (R26.2)     Time: 1660-6004 PT Time Calculation (min) (ACUTE ONLY): 12 min  Charges:  $Gait Training: 8-22 mins                    [employer], PT, GCS 05/29/21,2:29 PM

## 2021-05-29 NOTE — Evaluation (Signed)
Occupational Therapy Evaluation Patient Details Name: Peter Nichols MRN: [id number] DOB: 15-Mar-1946 Today's Date: 05/29/2021    History of Present Illness Peter Nichols is a 75 y.o. male with medical history significant of HTN, HLD, prior EtOH abuse.  Pt recently admitted for hepatic encephalopathy and hyperammonemia in setting of EtOH cirrhosis.  Treated with lactulose.  Also had Lasix added as scheduled med and BP meds adjusted during that admit.  Pt discharged 8/6. Has been taking Lactulose at least 2 times a day since discharge.  He has been taking multiple BP meds + Lasix since discharge as well. Pt with 2 episodes of near syncope at home today.  No injury during this: thankfully wife caught the patient, though unfortunately this caused wife to break her arm in the process (wife being seen separately in ED for broken arm).   Clinical Impression   Pt. Was pleasant and cooperative with session. Pt. Was able to preform ADLs and ADL transfers without assist. Pt. Daughter is concerned because he has 14 steps to get upstairs where the bed and full bath are. Will contact PT to inform them of this to address.     Follow Up Recommendations  No OT follow up    Equipment Recommendations  None recommended by OT    Recommendations for Other Services       Precautions / Restrictions Precautions Precautions: Fall Restrictions Weight Bearing Restrictions: No      Mobility Bed Mobility Overal bed mobility: Independent                  Transfers Overall transfer level: Independent Equipment used: None                  Balance Overall balance assessment: No apparent balance deficits (not formally assessed)                                         ADL either performed or assessed with clinical judgement   ADL Overall ADL's : Modified independent                                       General ADL Comments: Pt. is able to preform ADLs and  ADL transfers without difficulty.     Vision Baseline Vision/History: Wears glasses Wears Glasses: At all times Patient Visual Report: No change from baseline Vision Assessment?: No apparent visual deficits     Perception     Praxis      Pertinent Vitals/Pain Pain Assessment: No/denies pain     Hand Dominance Right   Extremity/Trunk Assessment Upper Extremity Assessment Upper Extremity Assessment: Overall WFL for tasks assessed   Lower Extremity Assessment Lower Extremity Assessment: Defer to PT evaluation   Cervical / Trunk Assessment Cervical / Trunk Assessment: Normal   Communication Communication Communication: No difficulties   Cognition Arousal/Alertness: Awake/alert Behavior During Therapy: WFL for tasks assessed/performed Overall Cognitive Status: Within Functional Limits for tasks assessed                                     General Comments       Exercises     Shoulder Instructions      Home Living Family/patient expects to  be discharged to:: Private residence Living Arrangements: Spouse/significant other Available Help at Discharge: Family Type of Home: House Home Access: Stairs to enter Technical brewer of Steps: 1   Home Layout: Two level;1/2 bath on main level;Bed/bath upstairs Alternate Level Stairs-Number of Steps: 14 (there is a landing that pt. is able to rest at.) Alternate Level Stairs-Rails: Left Bathroom Shower/Tub: Occupational psychologist: Handicapped height     Home Equipment: [place] held shower head          Prior Functioning/Environment Level of Independence: Independent                 OT Problem List:        OT Treatment/Interventions:      OT Goals(Current goals can be found in the care plan section) Acute Rehab OT Goals Patient Stated Goal: go home  OT Frequency:     Barriers to D/C:            Co-evaluation              AM-PAC OT "6 Clicks" Daily  Activity     Outcome Measure Help from another person eating meals?: None Help from another person taking care of personal grooming?: None Help from another person toileting, which includes using toliet, bedpan, or urinal?: None Help from another person bathing (including washing, rinsing, drying)?: None Help from another person to put on and taking off regular upper body clothing?: None Help from another person to put on and taking off regular lower body clothing?: None 6 Click Score: 24   End of Session Nurse Communication:  (ok therapy)  Activity Tolerance: Patient tolerated treatment well Patient left: in bed;with call bell/phone within reach;with bed alarm set;with family/visitor present                   Time: 1779-3903 OT Time Calculation (min): 27 min Charges:  OT General Charges $OT Visit: 1 Visit OT Evaluation $OT Eval Moderate Complexity: 1 Mod Reece Packer OT/L   [employer] 05/29/2021, 12:00 PM

## 2021-05-29 NOTE — Evaluation (Signed)
Physical Therapy Evaluation Patient Details Name: Peter Nichols MRN: [id number] DOB: 12-25-1945 Today's Date: 05/29/2021   History of Present Illness  Peter Nichols is a 75 y.o. male with medical history significant of HTN, HLD, prior EtOH abuse.  Pt recently admitted for hepatic encephalopathy and hyperammonemia in setting of EtOH cirrhosis.  Treated with lactulose.  Also had Lasix added as scheduled med and BP meds adjusted during that admit.  Pt discharged 8/6. Has been taking Lactulose at least 2 times a day since discharge.  He has been taking multiple BP meds + Lasix since discharge as well. Pt with 2 episodes of near syncope at home today.  No injury during this: thankfully wife caught the patient, though unfortunately this caused wife to break her arm in the process (wife being seen separately in ED for broken arm).   Clinical Impression  Patient received in bed, reports he is feeling good. No dizziness reported. He is agreeable to PT assessment. Orthostatic vital signs taken prior to mobility with results as follows: supine 110/49, seated 116/49, standing 102/50, standing after 3 min 113/51. He reports no dizziness or difficulty with mobility at this time. Ambulated in room independently. Patient appears to be at baseline level of function and does not require PT follow up.      Follow Up Recommendations No PT follow up    Equipment Recommendations  None recommended by PT    Recommendations for Other Services       Precautions / Restrictions Precautions Precautions: Fall Restrictions Weight Bearing Restrictions: No      Mobility  Bed Mobility Overal bed mobility: Independent                  Transfers Overall transfer level: Independent Equipment used: None                Ambulation/Gait Ambulation/Gait assistance: Supervision Gait Distance (Feet): 50 Feet Assistive device: None Gait Pattern/deviations: Step-through pattern Gait velocity: decr    General Gait Details: patient ambulating well in room without AD. No lob. No dizziness reported  Financial trader Rankin (Stroke Patients Only)       Balance Overall balance assessment: No apparent balance deficits (not formally assessed)                                           Pertinent Vitals/Pain Pain Assessment: No/denies pain    Home Living Family/patient expects to be discharged to:: Private residence Living Arrangements: Spouse/significant other Available Help at Discharge: Family Type of Home: [place]: One level        Prior Function Level of Independence: Independent               Hand Dominance        Extremity/Trunk Assessment   Upper Extremity Assessment Upper Extremity Assessment: Overall WFL for tasks assessed    Lower Extremity Assessment Lower Extremity Assessment: Overall WFL for tasks assessed    Cervical / Trunk Assessment Cervical / Trunk Assessment: Normal  Communication   Communication: No difficulties  Cognition Arousal/Alertness: Awake/alert Behavior During Therapy: WFL for tasks assessed/performed Overall Cognitive Status: Within Functional Limits for tasks assessed  General Comments      Exercises     Assessment/Plan    PT Assessment Patent does not need any further PT services  PT Problem List         PT Treatment Interventions      PT Goals (Current goals can be found in the Care Plan section)  Acute Rehab PT Goals Patient Stated Goal: to return home PT Goal Formulation: With patient Time For Goal Achievement: 05/31/21 Potential to Achieve Goals: Good    Frequency     Barriers to discharge        Co-evaluation               AM-PAC PT "6 Clicks" Mobility  Outcome Measure Help needed turning from your back to your side while in a flat bed without using bedrails?:  None Help needed moving from lying on your back to sitting on the side of a flat bed without using bedrails?: None Help needed moving to and from a bed to a chair (including a wheelchair)?: None Help needed standing up from a chair using your arms (e.g., wheelchair or bedside chair)?: None Help needed to walk in hospital room?: None Help needed climbing 3-5 steps with a railing? : None 6 Click Score: 24    End of Session   Activity Tolerance: Patient tolerated treatment well Patient left: in bed;with call bell/phone within reach;with bed alarm set Nurse Communication: Mobility status PT Visit Diagnosis: Difficulty in walking, not elsewhere classified (R26.2)    Time: 9507-2257 PT Time Calculation (min) (ACUTE ONLY): 26 min   Charges:   PT Evaluation $PT Eval Moderate Complexity: 1 Mod PT Treatments $Gait Training: 8-22 mins        [employer], PT, GCS 05/29/21,10:08 AM

## 2021-05-29 NOTE — Progress Notes (Signed)
PROGRESS NOTE    Peter Nichols  MPN:[id number] DOB: 08-30-1946 DOA: 05/27/2021 PCP: [name], MD    Chief Complaint  Patient presents with   Near Syncope   Hypotension    Brief Narrative:   Peter Nichols is a 75 y.o. male with medical history significant of HTN, HLD, prior EtOH abuse.  Pt recently admitted for hepatic encephalopathy and hyperammonemia in setting of EtOH cirrhosis.  Treated with lactulose and discharged home on 05/24/21. Presents to ED yesterday for recurrent near syncope.   Assessment & Plan:   Principal Problem:   AKI (acute kidney injury) ([place]) Active Problems:   Hyperlipidemia   Hypertension   Alcoholic cirrhosis of liver with ascites ([place])   Near syncope   Near syncope probably secondary to hypotension.  Probably secondary to over diuresis , with addition of lasix , norvasc, lotensin, , propranolol and use of chlorthalidone.  All BP meds and diuretics discontinued.  Pt received about 2 lit of fluids so far, and was started on RL at 119m/hr.  BP normalized. We have stopped the fluids  Get therapy evaluations and check orthostatic vs.  EKG shows sinus rhythm.  Pt is currently asymptomatic, denies any dizziness, headache.   AKI  Probably from hypovolemia and dehydration. Admitted with a creatinine of 2. Baseline hemoglobin around 1.  Slowly improving.  Creatinine of 1.6 today.    Liver cirrhosis from alcohol use with ascites.  Diuretics on hold for hypotension and AKI.  Continue to hold the lasix.    Hepatic encephalopathy:  Appears to have resolved.  Ammonia level slightly elevated in 71's. Increase lactulose to tid.  He is alert and oriented and answering all questions appropriately.     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  Hemoglobin between 9 to 10.    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  Monitor counts.     Hyperlipidemia:  Resume statin.    Hyponatremia:  Suspect probably from dehydration  and RL fluids.  Recheck sodium in am improved to 131.  TSH elevated but free t4 wnl.  Will check urine sodium, urine osmo and serum osmo in am if persistent hyponatremic.     DVT prophylaxis: SCD's Code Status: (Full Code) Family Communication: none at bedside.  Disposition:   Status is: Observation  The patient will require care spanning > 2 midnights and should be moved to inpatient because: Unsafe d/c plan  Dispo: The patient is from: Home              Anticipated d/c is to: Home              Patient currently is medically stable to d/c.   Difficult to place patient No       Consultants:  None.   Procedures: None.   Antimicrobials: None.    Subjective: No chest pain or sob, no nausea , vomiting.   Objective: Vitals:   05/29/21 0616 05/29/21 0800 05/29/21 1148 05/29/21 1150  BP: 107/68 (!) 105/57  (!) 106/52  Pulse: (!) 55 (!) 54 (!) 51 (!) 51  Resp: 16   20  Temp: 97.6 F (36.4 C)   97.8 F (36.6 C)  TempSrc: Oral   Oral  SpO2: 98% 100%  99%  Weight:      Height:       No intake or output data in the 24 hours ending 05/29/21 1459  Filed Weights   05/27/21 1524 05/28/21 1547  Weight: 85.7 kg 92  kg    Examination:  General exam: Appears calm and comfortable.  Respiratory system: air entry fair, no wheezing heard.  Cardiovascular system: RRR, no JVD, no pedal edema.  Gastrointestinal system: Abdomen is soft, distended, bowel sounds wnl.  Central nervous system: Alert and oriented, non focal Extremities: no edema.  Skin: No rashes seen.  Psychiatry:  Mood is appropriate.      Data Reviewed: I have personally reviewed following labs and imaging studies  CBC: Recent Labs  Lab 05/23/21 0340 05/27/21 1634 05/28/21 0401  WBC 4.7 6.9 6.3  NEUTROABS  --  3.5  --   HGB 10.1* 10.9* 9.4*  HCT 29.3* 31.0* 27.1*  MCV 99.7 100.0 99.3  PLT 95* 115* 87*     Basic Metabolic Panel: Recent Labs  Lab 05/23/21 0340 05/27/21 1643 05/28/21 0401  05/29/21 0305  NA 136 130* 130* 131*  K 3.8 4.1 3.8 4.8  CL 111 102 102 101  CO2 22 23 21* 24  GLUCOSE 106* 103* 115* 123*  BUN 16 25* 26* 28*  CREATININE 0.87 2.07* 1.85* 1.66*  CALCIUM 8.7* 8.1* 7.8* 8.5*     GFR: Estimated Creatinine Clearance: 43.7 mL/min (A) (by C-G formula based on SCr of 1.66 mg/dL (H)).  Liver Function Tests: Recent Labs  Lab 05/23/21 0340 05/27/21 1643 05/28/21 0401  AST 40 40 36  ALT 39 36 30  ALKPHOS 50 58 47  BILITOT 3.5* 3.4* 2.4*  PROT 5.5* 5.3* 4.4*  ALBUMIN 2.7* 2.5* 2.1*     CBG: Recent Labs  Lab 05/27/21 1628  GLUCAP 102*      Recent Results (from the past 240 hour(s))  Resp Panel by RT-PCR (Flu A&B, Covid) Nasopharyngeal Swab     Status: None   Collection Time: 05/22/21  3:00 PM   Specimen: Nasopharyngeal Swab; Nasopharyngeal(NP) swabs in vial transport medium  Result Value Ref Range Status   SARS Coronavirus 2 by RT PCR NEGATIVE NEGATIVE Final    Comment: (NOTE) SARS-CoV-2 target nucleic acids are NOT DETECTED.  The SARS-CoV-2 RNA is generally detectable in upper respiratory specimens during the acute phase of infection. The lowest concentration of SARS-CoV-2 viral copies this assay can detect is 138 copies/mL. A negative result does not preclude SARS-Cov-2 infection and should not be used as the sole basis for treatment or other patient management decisions. A negative result may occur with  improper specimen collection/handling, submission of specimen other than nasopharyngeal swab, presence of viral mutation(s) within the areas targeted by this assay, and inadequate number of viral copies(<138 copies/mL). A negative result must be combined with clinical observations, patient history, and epidemiological information. The expected result is Negative.  Fact Sheet for Patients:  EntrepreneurPulse.com.au  Fact Sheet for Healthcare Providers:  IncredibleEmployment.be  This test is  no t yet approved or cleared by the [place] FDA and  has been authorized for detection and/or diagnosis of SARS-CoV-2 by FDA under an Emergency Use Authorization (EUA). This EUA will remain  in effect (meaning this test can be used) for the duration of the COVID-19 declaration under Section 564(b)(1) of the Act, 21 U.S.C.section 360bbb-3(b)(1), unless the authorization is terminated  or revoked sooner.       Influenza A by PCR NEGATIVE NEGATIVE Final   Influenza B by PCR NEGATIVE NEGATIVE Final    Comment: (NOTE) The Xpert Xpress SARS-CoV-2/FLU/RSV plus assay is intended as an aid in the diagnosis of influenza from Nasopharyngeal swab specimens and should not be used as a sole  basis for treatment. Nasal washings and aspirates are unacceptable for Xpert Xpress SARS-CoV-2/FLU/RSV testing.  Fact Sheet for Patients: EntrepreneurPulse.com.au  Fact Sheet for Healthcare Providers: IncredibleEmployment.be  This test is not yet approved or cleared by the [place] FDA and has been authorized for detection and/or diagnosis of SARS-CoV-2 by FDA under an Emergency Use Authorization (EUA). This EUA will remain in effect (meaning this test can be used) for the duration of the COVID-19 declaration under Section 564(b)(1) of the Act, 21 U.S.C. section 360bbb-3(b)(1), unless the authorization is terminated or revoked.  Performed at [facility], [place] [address], [place], [place] [address]   Body fluid culture w Gram Stain     Status: None   Collection Time: 05/22/21  4:03 PM   Specimen: PATH Cytology Peritoneal fluid  Result Value Ref Range Status   Specimen Description   Final    PERITONEAL Performed at [place] [address], [place], [place] [address]    Special Requests   Final    NONE Performed at [facility], [place] [address] [place], [place] [address]    Gram Stain   Final     WBC PRESENT, PREDOMINANTLY MONONUCLEAR NO ORGANISMS SEEN CYTOSPIN SMEAR    Culture   Final    NO GROWTH Performed at [facility], [place] [address], [place], [place] [address]    Report Status 05/26/2021 FINAL  Final  SARS CORONAVIRUS 2 (TAT 6-24 HRS) Nasopharyngeal Nasopharyngeal Swab     Status: None   Collection Time: 05/28/21  6:14 PM   Specimen: Nasopharyngeal Swab  Result Value Ref Range Status   SARS Coronavirus 2 NEGATIVE NEGATIVE Final    Comment: (NOTE) SARS-CoV-2 target nucleic acids are NOT DETECTED.  The SARS-CoV-2 RNA is generally detectable in upper and lower respiratory specimens during the acute phase of infection. Negative results do not preclude SARS-CoV-2 infection, do not rule out co-infections with other pathogens, and should not be used as the sole basis for treatment or other patient management decisions. Negative results must be combined with clinical observations, patient history, and epidemiological information. The expected result is Negative.  Fact Sheet for Patients: SugarRoll.be  Fact Sheet for Healthcare Providers: https://www.woods-mathews.com/  This test is not yet approved or cleared by the [place] FDA and  has been authorized for detection and/or diagnosis of SARS-CoV-2 by FDA under an Emergency Use Authorization (EUA). This EUA will remain  in effect (meaning this test can be used) for the duration of the COVID-19 declaration under Se ction 564(b)(1) of the Act, 21 U.S.C. section 360bbb-3(b)(1), unless the authorization is terminated or revoked sooner.  Performed at [facility], [place] [address] [place],  [address]           Radiology Studies: DG Chest Portable 1 View  Result Date: 05/27/2021 CLINICAL DATA:  Near syncope EXAM: PORTABLE CHEST 1 VIEW COMPARISON:  11/28/2007 FINDINGS: The heart size and mediastinal contours are within normal limits. Both lungs are  clear. The visualized skeletal structures are unremarkable. IMPRESSION: No active disease. Electronically Signed   By: Donavan Foil M.D.   On: 05/27/2021 16:11        Scheduled Meds:  AeroChamber Plus Flo-Vu Large  1 each Other Once   feeding supplement  237 mL Oral BID BM   lactulose  20 g Oral TID   multivitamin with minerals  1 tablet Oral Daily   pravastatin  40 mg Oral Daily  Continuous Infusions:   LOS: 0 days        Hosie Poisson, MD [facility]   To contact the attending provider between 7A-7P or the covering provider during after hours 7P-7A, please log into the web site [email or web address] and access using universal [place] password for that web site. If you do not have the password, please call the hospital operator.  05/29/2021, 2:59 PM

## 2021-05-30 ENCOUNTER — Inpatient Hospital Stay (HOSPITAL_COMMUNITY): Payer: Medicare HMO

## 2021-05-30 DIAGNOSIS — K7031 Alcoholic cirrhosis of liver with ascites: Secondary | ICD-10-CM | POA: Diagnosis not present

## 2021-05-30 DIAGNOSIS — N179 Acute kidney failure, unspecified: Secondary | ICD-10-CM | POA: Diagnosis not present

## 2021-05-30 DIAGNOSIS — I10 Essential (primary) hypertension: Secondary | ICD-10-CM | POA: Diagnosis not present

## 2021-05-30 LAB — BASIC METABOLIC PANEL
Anion gap: 4 — ABNORMAL LOW (ref 5–15)
BUN: 24 mg/dL — ABNORMAL HIGH (ref 8–23)
CO2: 25 mmol/L (ref 22–32)
Calcium: 9.1 mg/dL (ref 8.9–10.3)
Chloride: 103 mmol/L (ref 98–111)
Creatinine, Ser: 1.28 mg/dL — ABNORMAL HIGH (ref 0.61–1.24)
GFR, Estimated: 59 mL/min — ABNORMAL LOW (ref >60)
Glucose, Bld: 139 mg/dL — ABNORMAL HIGH (ref 70–99)
Potassium: 4.6 mmol/L (ref 3.5–5.1)
Sodium: 132 mmol/L — ABNORMAL LOW (ref 135–145)

## 2021-05-30 LAB — AMMONIA: Ammonia: 73 umol/L — ABNORMAL HIGH (ref 9–35)

## 2021-05-30 MED ORDER — FUROSEMIDE 20 MG PO TABS
20.0000 mg | ORAL_TABLET | Freq: Every day | ORAL | Status: DC
  Administered 2021-05-30: 20 mg via ORAL
  Filled 2021-05-30: qty 1

## 2021-05-30 NOTE — Progress Notes (Signed)
PROGRESS NOTE    Peter Nichols  UXL:[id number] DOB: [birth year] DOA: 05/27/2021 PCP: [name], MD    Chief Complaint  Patient presents with   Near Syncope   Hypotension    Brief Narrative:   Peter Nichols is a 75 y.o. male with medical history significant of HTN, HLD, prior EtOH abuse.  Pt recently admitted for hepatic encephalopathy and hyperammonemia in setting of EtOH cirrhosis.  Treated with lactulose and discharged home on 05/24/21. Presents to ED yesterday for recurrent near syncope.   Assessment & Plan:   Principal Problem:   AKI (acute kidney injury) ([place]) Active Problems:   Hyperlipidemia   Hypertension   Alcoholic cirrhosis of liver with ascites ([place])   Near syncope   Near syncope probably secondary to hypotension.  Probably secondary to over diuresis , with addition of lasix , norvasc, lotensin, , propranolol and use of chlorthalidone.  All BP meds and diuretics discontinued on admission.  Pt received about 2 lit of fluids so far, and was started on RL at 133m/hr.  BP normalized. We have stopped the fluids  Orthostatic vs negative today.  Therapy evaluations did not recommend any supervision or follow up.  EKG shows sinus rhythm.  Pt is currently asymptomatic, denies any dizziness, headache.   AKI  Probably from hypovolemia and dehydration. Admitted with a creatinine of 2. Baseline hemoglobin around 1.  Slowly improving.  Creatinine is around 1.2. Good urine output.    Liver cirrhosis from alcohol use with ascites.  With improvement in the renal parameters and BP, we will go ahead and restart the last.  Get [place]domen to check for ascites to see if he needs paracentesis prior to discharge.    Hepatic encephalopathy:  Appears to have resolved.  Ammonia level slightly elevated at 73.  Increase lactulose to tid. Target atleast 2 to 3 bowel movements per day.     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  Hemoglobin between 9 to 10.     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  Monitor counts.     Hyperlipidemia:  Resume statin.    Hyponatremia:  Suspect probably from dehydration and RL fluids.  Sodium has improved to 132.  TSH elevated but free t4 wnl.  Will check urine sodium, urine osmo and serum osmo in am if persistent hyponatremic.     DVT prophylaxis: SCD's Code Status: (Full Code) Family Communication: family at bedside.  Disposition:   Status is: Observation  The patient will require care spanning > 2 midnights and should be moved to inpatient because: Unsafe d/c plan  Dispo: The patient is from: Home              Anticipated d/c is to: Home              Patient currently is medically stable to d/c.   Difficult to place patient No       Consultants:  None.   Procedures: None.   Antimicrobials: None.    Subjective:  No chest pain , or sob. Wants to get out of bed,  Objective: Vitals:   05/29/21 1538 05/29/21 2053 05/30/21 0343 05/30/21 1032  BP:  (!) 105/43 120/62 (!) 114/53  Pulse:   68 (!) 58  Resp:  18 18 18   Temp:  98.8 F (37.1 C) 98.5 F (36.9 C) 97.9 F (36.6 C)  TempSrc:  Oral Oral Oral  SpO2:  100% 99% 99%  Weight: 92.2 kg  Height:        Intake/Output Summary (Last 24 hours) at 05/30/2021 1304 Last data filed at 05/30/2021 0831 Gross per 24 hour  Intake 150 ml  Output --  Net 150 ml    Filed Weights   05/27/21 1524 05/28/21 1547 05/29/21 1538  Weight: 85.7 kg 92 kg 92.2 kg    Examination:  General exam: Alert and comfortable, not in distress.  Respiratory system: clear to auscultation, no wheezing or rhonchi.  Cardiovascular system: RRR, no JVD, no pedal edema.  Gastrointestinal system: Abdomen is soft, distended, bowel sounds wnl.  Central nervous system: Alert and oriented, non focal Extremities: no edema.  Skin: No rashes seen.  Psychiatry:  Mood is appropriate.      Data Reviewed: I have personally reviewed following  labs and imaging studies  CBC: Recent Labs  Lab 05/27/21 1634 05/28/21 0401  WBC 6.9 6.3  NEUTROABS 3.5  --   HGB 10.9* 9.4*  HCT 31.0* 27.1*  MCV 100.0 99.3  PLT 115* 87*     Basic Metabolic Panel: Recent Labs  Lab 05/27/21 1643 05/28/21 0401 05/29/21 0305 05/30/21 0909  NA 130* 130* 131* 132*  K 4.1 3.8 4.8 4.6  CL 102 102 101 103  CO2 23 21* 24 25  GLUCOSE 103* 115* 123* 139*  BUN 25* 26* 28* 24*  CREATININE 2.07* 1.85* 1.66* 1.28*  CALCIUM 8.1* 7.8* 8.5* 9.1     GFR: Estimated Creatinine Clearance: 56.8 mL/min (A) (by C-G formula based on SCr of 1.28 mg/dL (H)).  Liver Function Tests: Recent Labs  Lab 05/27/21 1643 05/28/21 0401  AST 40 36  ALT 36 30  ALKPHOS 58 47  BILITOT 3.4* 2.4*  PROT 5.3* 4.4*  ALBUMIN 2.5* 2.1*     CBG: Recent Labs  Lab 05/27/21 1628  GLUCAP 102*      Recent Results (from the past 240 hour(s))  Resp Panel by RT-PCR (Flu A&B, Covid) Nasopharyngeal Swab     Status: None   Collection Time: 05/22/21  3:00 PM   Specimen: Nasopharyngeal Swab; Nasopharyngeal(NP) swabs in vial transport medium  Result Value Ref Range Status   SARS Coronavirus 2 by RT PCR NEGATIVE NEGATIVE Final    Comment: (NOTE) SARS-CoV-2 target nucleic acids are NOT DETECTED.  The SARS-CoV-2 RNA is generally detectable in upper respiratory specimens during the acute phase of infection. The lowest concentration of SARS-CoV-2 viral copies this assay can detect is 138 copies/mL. A negative result does not preclude SARS-Cov-2 infection and should not be used as the sole basis for treatment or other patient management decisions. A negative result may occur with  improper specimen collection/handling, submission of specimen other than nasopharyngeal swab, presence of viral mutation(s) within the areas targeted by this assay, and inadequate number of viral copies(<138 copies/mL). A negative result must be combined with clinical observations, patient  history, and epidemiological information. The expected result is Negative.  Fact Sheet for Patients:  EntrepreneurPulse.com.au  Fact Sheet for Healthcare Providers:  IncredibleEmployment.be  This test is no t yet approved or cleared by the [place] FDA and  has been authorized for detection and/or diagnosis of SARS-CoV-2 by FDA under an Emergency Use Authorization (EUA). This EUA will remain  in effect (meaning this test can be used) for the duration of the COVID-19 declaration under Section 564(b)(1) of the Act, 21 U.S.C.section 360bbb-3(b)(1), unless the authorization is terminated  or revoked sooner.       Influenza A by PCR NEGATIVE NEGATIVE Final  Influenza B by PCR NEGATIVE NEGATIVE Final    Comment: (NOTE) The Xpert Xpress SARS-CoV-2/FLU/RSV plus assay is intended as an aid in the diagnosis of influenza from Nasopharyngeal swab specimens and should not be used as a sole basis for treatment. Nasal washings and aspirates are unacceptable for Xpert Xpress SARS-CoV-2/FLU/RSV testing.  Fact Sheet for Patients: EntrepreneurPulse.com.au  Fact Sheet for Healthcare Providers: IncredibleEmployment.be  This test is not yet approved or cleared by the [place] FDA and has been authorized for detection and/or diagnosis of SARS-CoV-2 by FDA under an Emergency Use Authorization (EUA). This EUA will remain in effect (meaning this test can be used) for the duration of the COVID-19 declaration under Section 564(b)(1) of the Act, 21 U.S.C. section 360bbb-3(b)(1), unless the authorization is terminated or revoked.  Performed at [facility], [place] [address], [place], [place] [address]   Body fluid culture w Gram Stain     Status: None   Collection Time: 05/22/21  4:03 PM   Specimen: PATH Cytology Peritoneal fluid  Result Value Ref Range Status   Specimen Description   Final     PERITONEAL Performed at [place] [address], [place], [place] [address]    Special Requests   Final    NONE Performed at [facility], [place] [address], [place], [place] [address]    Gram Stain   Final    WBC PRESENT, PREDOMINANTLY MONONUCLEAR NO ORGANISMS SEEN CYTOSPIN SMEAR    Culture   Final    NO GROWTH Performed at [facility], [place] [address] [place], [place] [address]    Report Status 05/26/2021 FINAL  Final  SARS CORONAVIRUS 2 (TAT 6-24 HRS) Nasopharyngeal Nasopharyngeal Swab     Status: None   Collection Time: 05/28/21  6:14 PM   Specimen: Nasopharyngeal Swab  Result Value Ref Range Status   SARS Coronavirus 2 NEGATIVE NEGATIVE Final    Comment: (NOTE) SARS-CoV-2 target nucleic acids are NOT DETECTED.  The SARS-CoV-2 RNA is generally detectable in upper and lower respiratory specimens during the acute phase of infection. Negative results do not preclude SARS-CoV-2 infection, do not rule out co-infections with other pathogens, and should not be used as the sole basis for treatment or other patient management decisions. Negative results must be combined with clinical observations, patient history, and epidemiological information. The expected result is Negative.  Fact Sheet for Patients: SugarRoll.be  Fact Sheet for Healthcare Providers: https://www.woods-mathews.com/  This test is not yet approved or cleared by the [place] FDA and  has been authorized for detection and/or diagnosis of SARS-CoV-2 by FDA under an Emergency Use Authorization (EUA). This EUA will remain  in effect (meaning this test can be used) for the duration of the COVID-19 declaration under Se ction 564(b)(1) of the Act, 21 U.S.C. section 360bbb-3(b)(1), unless the authorization is terminated or revoked sooner.  Performed at [facility], [place] [address] [place],  [address]            Radiology Studies: No results found.      Scheduled Meds:  AeroChamber Plus Flo-Vu Large  1 each Other Once   feeding supplement  237 mL Oral BID BM   lactulose  20 g Oral TID   multivitamin with minerals  1 tablet Oral Daily   pravastatin  40 mg Oral Daily   Continuous Infusions:   LOS: 1 day        Hosie Poisson, MD [facility]  To contact the attending provider between 7A-7P or the covering provider during after hours 7P-7A, please log into the web site [email or web address] and access using universal [place] password for that web site. If you do not have the password, please call the hospital operator.  05/30/2021, 1:04 PM

## 2021-05-31 ENCOUNTER — Inpatient Hospital Stay (HOSPITAL_COMMUNITY): Payer: Medicare HMO

## 2021-05-31 DIAGNOSIS — K7031 Alcoholic cirrhosis of liver with ascites: Secondary | ICD-10-CM | POA: Diagnosis not present

## 2021-05-31 DIAGNOSIS — I10 Essential (primary) hypertension: Secondary | ICD-10-CM | POA: Diagnosis not present

## 2021-05-31 DIAGNOSIS — N179 Acute kidney failure, unspecified: Secondary | ICD-10-CM | POA: Diagnosis not present

## 2021-05-31 LAB — CBC
HCT: 26.7 % — ABNORMAL LOW (ref 39.0–52.0)
Hemoglobin: 9.4 g/dL — ABNORMAL LOW (ref 13.0–17.0)
MCH: 35.6 pg — ABNORMAL HIGH (ref 26.0–34.0)
MCHC: 35.2 g/dL (ref 30.0–36.0)
MCV: 101.1 fL — ABNORMAL HIGH (ref 80.0–100.0)
Platelets: 74 10*3/uL — ABNORMAL LOW (ref 150–400)
RBC: 2.64 MIL/uL — ABNORMAL LOW (ref 4.22–5.81)
RDW: 14.1 % (ref 11.5–15.5)
WBC: 4.2 10*3/uL (ref 4.0–10.5)
nRBC: 0 % (ref 0.0–0.2)

## 2021-05-31 LAB — BASIC METABOLIC PANEL
Anion gap: 6 (ref 5–15)
BUN: 21 mg/dL (ref 8–23)
CO2: 24 mmol/L (ref 22–32)
Calcium: 9.5 mg/dL (ref 8.9–10.3)
Chloride: 103 mmol/L (ref 98–111)
Creatinine, Ser: 1.08 mg/dL (ref 0.61–1.24)
GFR, Estimated: >60 mL/min (ref >60)
Glucose, Bld: 95 mg/dL (ref 70–99)
Potassium: 5.3 mmol/L — ABNORMAL HIGH (ref 3.5–5.1)
Sodium: 133 mmol/L — ABNORMAL LOW (ref 135–145)

## 2021-05-31 MED ORDER — FUROSEMIDE 40 MG PO TABS
40.0000 mg | ORAL_TABLET | Freq: Every day | ORAL | Status: DC
  Administered 2021-05-31 – 2021-06-02 (×3): 40 mg via ORAL
  Filled 2021-05-31 (×4): qty 1

## 2021-05-31 MED ORDER — LIDOCAINE HCL (PF) 1 % IJ SOLN
INTRAMUSCULAR | Status: AC
  Filled 2021-05-31: qty 30

## 2021-05-31 NOTE — Plan of Care (Signed)
  Problem: Clinical Measurements: Goal: Ability to maintain clinical measurements within normal limits will improve Outcome: Progressing Goal: Will remain free from infection Outcome: Progressing

## 2021-05-31 NOTE — Progress Notes (Signed)
PROGRESS NOTE    Peter Nichols  CWC:[id number] DOB: [birth year] DOA: 05/27/2021 PCP: [name], MD    Chief Complaint  Patient presents with   Near Syncope   Hypotension    Brief Narrative:   Peter Nichols is a 75 y.o. male with medical history significant of HTN, HLD, prior EtOH abuse.  Pt recently admitted for hepatic encephalopathy and hyperammonemia in setting of EtOH cirrhosis.  Treated with lactulose and discharged home on 05/24/21. Presents to ED yesterday for recurrent near syncope.   Assessment & Plan:   Principal Problem:   AKI (acute kidney injury) ([place]) Active Problems:   Hyperlipidemia   Hypertension   Alcoholic cirrhosis of liver with ascites ([place])   Near syncope   Near syncope probably secondary to hypotension.  Probably secondary to over diuresis , with addition of lasix , norvasc, lotensin, , propranolol and use of chlorthalidone.  All BP meds and diuretics discontinued on admission.  Pt received about 2 lit of fluids on admission, and was started on RL at 181m/hr.  BP normalized. We have stopped the fluids  Orthostatic vs negative. Therapy evaluations did not recommend any supervision or follow up.  EKG shows sinus rhythm.  Pt is currently asymptomatic, denies any dizziness, headache.   AKI  Probably from hypovolemia and dehydration. Admitted with a creatinine of 2. Baseline hemoglobin around 1.  Slowly improving.  Creatinine normalized.  Good urine output.    Liver cirrhosis from alcohol use with ascites.  With improvement in the renal parameters and BP, we will go ahead and restart the lasix and increased the dose to lasix 40 mg daily..  [place]ome showed mod ascites . [place]aracentesis done today.    Hepatic encephalopathy:  Appears to have resolved. Pt alert and oriented.  Ammonia level slightly elevated at 73.  Increase lactulose to tid. Target atleast 2 to 3 bowel movements per day.     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   Hemoglobin between 9 to 10.    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  Monitor counts.     Hyperlipidemia:  Resume statin.    Hyponatremia:  Suspect probably from dehydration and RL fluids.  Sodium has improved to 132.  TSH elevated but free t4 wnl.     Hyperkalemia: slightly elevated.  Increased the lasix to 40 mg daily.  Un clear etiology. Recheck potassium in am.    DVT prophylaxis: SCD's Code Status: (Full Code) Family Communication: family at bedside.  Disposition:   Status is: Observation  The patient will require care spanning > 2 midnights and should be moved to inpatient because: Unsafe d/c plan  Dispo: The patient is from: Home              Anticipated d/c is to: Home              Patient currently is not medically stable to d/c.   Difficult to place patient No       Consultants:  None.   Procedures: None.   Antimicrobials: None.    Subjective:  No sob, abd is distended.  Objective: Vitals:   05/31/21 0528 05/31/21 1240 05/31/21 1305 05/31/21 1435  BP: (!) 113/54 126/61 (!) 124/59 123/63  Pulse: 62   62  Resp: 20   (!) 24  Temp: 98.3 F (36.8 C)   98.4 F (36.9 C)  TempSrc: Oral   Oral  SpO2: 96%   98%  Weight:  Height:        Intake/Output Summary (Last 24 hours) at 05/31/2021 1656 Last data filed at 05/31/2021 1052 Gross per 24 hour  Intake 150 ml  Output --  Net 150 ml    Filed Weights   05/27/21 1524 05/28/21 1547 05/29/21 1538  Weight: 85.7 kg 92 kg 92.2 kg    Examination:  General exam: Alert and comfortable, not in distress.  Respiratory system: air entry fair, no wheezing heard.  Cardiovascular system: RRR, NO JVD, no pedal edema.  Gastrointestinal system: Abdomen is soft, non tender  distended, bowel sounds wnl.  Central nervous system: Alert and oriented, non focal Extremities:no pedal edema.  Skin: No rashes seen.   Psychiatry:  Mood is appropriate.      Data Reviewed: I have  personally reviewed following labs and imaging studies  CBC: Recent Labs  Lab 05/27/21 1634 05/28/21 0401 05/31/21 0344  WBC 6.9 6.3 4.2  NEUTROABS 3.5  --   --   HGB 10.9* 9.4* 9.4*  HCT 31.0* 27.1* 26.7*  MCV 100.0 99.3 101.1*  PLT 115* 87* 74*     Basic Metabolic Panel: Recent Labs  Lab 05/27/21 1643 05/28/21 0401 05/29/21 0305 05/30/21 0909 05/31/21 0344  NA 130* 130* 131* 132* 133*  K 4.1 3.8 4.8 4.6 5.3*  CL 102 102 101 103 103  CO2 23 21* 24 25 24   GLUCOSE 103* 115* 123* 139* 95  BUN 25* 26* 28* 24* 21  CREATININE 2.07* 1.85* 1.66* 1.28* 1.08  CALCIUM 8.1* 7.8* 8.5* 9.1 9.5     GFR: Estimated Creatinine Clearance: 67.3 mL/min (by C-G formula based on SCr of 1.08 mg/dL).  Liver Function Tests: Recent Labs  Lab 05/27/21 1643 05/28/21 0401  AST 40 36  ALT 36 30  ALKPHOS 58 47  BILITOT 3.4* 2.4*  PROT 5.3* 4.4*  ALBUMIN 2.5* 2.1*     CBG: Recent Labs  Lab 05/27/21 1628  GLUCAP 102*      Recent Results (from the past 240 hour(s))  Resp Panel by RT-PCR (Flu A&B, Covid) Nasopharyngeal Swab     Status: None   Collection Time: 05/22/21  3:00 PM   Specimen: Nasopharyngeal Swab; Nasopharyngeal(NP) swabs in vial transport medium  Result Value Ref Range Status   SARS Coronavirus 2 by RT PCR NEGATIVE NEGATIVE Final    Comment: (NOTE) SARS-CoV-2 target nucleic acids are NOT DETECTED.  The SARS-CoV-2 RNA is generally detectable in upper respiratory specimens during the acute phase of infection. The lowest concentration of SARS-CoV-2 viral copies this assay can detect is 138 copies/mL. A negative result does not preclude SARS-Cov-2 infection and should not be used as the sole basis for treatment or other patient management decisions. A negative result may occur with  improper specimen collection/handling, submission of specimen other than nasopharyngeal swab, presence of viral mutation(s) within the areas targeted by this assay, and inadequate  number of viral copies(<138 copies/mL). A negative result must be combined with clinical observations, patient history, and epidemiological information. The expected result is Negative.  Fact Sheet for Patients:  EntrepreneurPulse.com.au  Fact Sheet for Healthcare Providers:  IncredibleEmployment.be  This test is no t yet approved or cleared by the [place] FDA and  has been authorized for detection and/or diagnosis of SARS-CoV-2 by FDA under an Emergency Use Authorization (EUA). This EUA will remain  in effect (meaning this test can be used) for the duration of the COVID-19 declaration under Section 564(b)(1) of the Act, 21 U.S.C.section 360bbb-3(b)(1), unless  the authorization is terminated  or revoked sooner.       Influenza A by PCR NEGATIVE NEGATIVE Final   Influenza B by PCR NEGATIVE NEGATIVE Final    Comment: (NOTE) The Xpert Xpress SARS-CoV-2/FLU/RSV plus assay is intended as an aid in the diagnosis of influenza from Nasopharyngeal swab specimens and should not be used as a sole basis for treatment. Nasal washings and aspirates are unacceptable for Xpert Xpress SARS-CoV-2/FLU/RSV testing.  Fact Sheet for Patients: EntrepreneurPulse.com.au  Fact Sheet for Healthcare Providers: IncredibleEmployment.be  This test is not yet approved or cleared by the [place] FDA and has been authorized for detection and/or diagnosis of SARS-CoV-2 by FDA under an Emergency Use Authorization (EUA). This EUA will remain in effect (meaning this test can be used) for the duration of the COVID-19 declaration under Section 564(b)(1) of the Act, 21 U.S.C. section 360bbb-3(b)(1), unless the authorization is terminated or revoked.  Performed at [facility], [place] [address] [place], [place] [address]   Body fluid culture w Gram Stain     Status: None   Collection Time: 05/22/21  4:03 PM    Specimen: PATH Cytology Peritoneal fluid  Result Value Ref Range Status   Specimen Description   Final    PERITONEAL Performed at [facility] [address] [place], [place] [address]    Special Requests   Final    NONE Performed at [facility], [place] [address] [place], [place] [address]    Gram Stain   Final    WBC PRESENT, PREDOMINANTLY MONONUCLEAR NO ORGANISMS SEEN CYTOSPIN SMEAR    Culture   Final    NO GROWTH Performed at [facility], [place] [address] [place],  [address]    Report Status 05/26/2021 FINAL  Final  SARS CORONAVIRUS 2 (TAT 6-24 HRS) Nasopharyngeal Nasopharyngeal Swab     Status: None   Collection Time: 05/28/21  6:14 PM   Specimen: Nasopharyngeal Swab  Result Value Ref Range Status   SARS Coronavirus 2 NEGATIVE NEGATIVE Final    Comment: (NOTE) SARS-CoV-2 target nucleic acids are NOT DETECTED.  The SARS-CoV-2 RNA is generally detectable in upper and lower respiratory specimens during the acute phase of infection. Negative results do not preclude SARS-CoV-2 infection, do not rule out co-infections with other pathogens, and should not be used as the sole basis for treatment or other patient management decisions. Negative results must be combined with clinical observations, patient history, and epidemiological information. The expected result is Negative.  Fact Sheet for Patients: SugarRoll.be  Fact Sheet for Healthcare Providers: https://www.woods-mathews.com/  This test is not yet approved or cleared by the [place] FDA and  has been authorized for detection and/or diagnosis of SARS-CoV-2 by FDA under an Emergency Use Authorization (EUA). This EUA will remain  in effect (meaning this test can be used) for the duration of the COVID-19 declaration under Se ction 564(b)(1) of the Act, 21 U.S.C. section 360bbb-3(b)(1), unless the authorization is terminated  or revoked sooner.  Performed at [facility], [place] [address] [place],  [address]           Radiology Studies: US Abdomen Limited  Result Date: 05/30/2021 CLINICAL DATA:  Abdominal distension EXAM: LIMITED ABDOMEN ULTRASOUND FOR ASCITES TECHNIQUE: Limited ultrasound survey for ascites was performed in all four abdominal quadrants. COMPARISON:  None. FINDINGS: Scanning throughout the abdomen shows ascites in all 4 abdominal quadrants. Largest pocket is noted in the right lower  quadrant. IMPRESSION: Scattered mild to moderate ascites. Electronically Signed   By: Inez Catalina M.D.   On: 05/30/2021 17:30   US Paracentesis  Result Date: 05/31/2021 INDICATION: Patient with history of alcoholic cirrhosis and recurrent ascites presents today for a therapeutic paracentesis up to 1 L. EXAM: ULTRASOUND GUIDED PARACENTESIS MEDICATIONS: 1% lidocaine 10 mL COMPLICATIONS: None immediate. PROCEDURE: Informed written consent was obtained from the patient after a discussion of the risks, benefits and alternatives to treatment. A timeout was performed prior to the initiation of the procedure. Initial ultrasound scanning demonstrates a large amount of ascites within the right lower abdominal quadrant. The right lower abdomen was prepped and draped in the usual sterile fashion. 1% lidocaine was used for local anesthesia. Following this, a 19 gauge, 7-cm, Yueh catheter was introduced. An ultrasound image was saved for documentation purposes. The paracentesis was performed. The catheter was removed and a dressing was applied. The patient tolerated the procedure well without immediate post procedural complication. FINDINGS: A total of approximately 1 L of clear yellow fluid was removed. IMPRESSION: Successful ultrasound-guided paracentesis yielding 1 liters of peritoneal fluid. Read by: Soyla Dryer, NP Electronically Signed   By: Corrie Mckusick D.O.   On: 05/31/2021 14:24        Scheduled Meds:   AeroChamber Plus Flo-Vu Large  1 each Other Once   feeding supplement  237 mL Oral BID BM   furosemide  40 mg Oral Daily   lactulose  20 g Oral TID   lidocaine (PF)       multivitamin with minerals  1 tablet Oral Daily   pravastatin  40 mg Oral Daily   Continuous Infusions:   LOS: 2 days        Hosie Poisson, MD [facility]   To contact the attending provider between 7A-7P or the covering provider during after hours 7P-7A, please log into the web site [email or web address] and access using universal [place] password for that web site. If you do not have the password, please call the hospital operator.  05/31/2021, 4:56 PM

## 2021-05-31 NOTE — Procedures (Signed)
PROCEDURE SUMMARY:  Successful US guided paracentesis from right abdomen.  Yielded 1 L of clear yellow fluid.  No immediate complications.  Pt tolerated well.   EBL < 2 mL  Theresa Duty, NP 05/31/2021 2:25 PM

## 2021-06-01 ENCOUNTER — Inpatient Hospital Stay (HOSPITAL_COMMUNITY): Payer: Medicare HMO

## 2021-06-01 DIAGNOSIS — I10 Essential (primary) hypertension: Secondary | ICD-10-CM | POA: Diagnosis not present

## 2021-06-01 DIAGNOSIS — K859 Acute pancreatitis without necrosis or infection, unspecified: Secondary | ICD-10-CM

## 2021-06-01 DIAGNOSIS — N179 Acute kidney failure, unspecified: Secondary | ICD-10-CM | POA: Diagnosis not present

## 2021-06-01 DIAGNOSIS — K7031 Alcoholic cirrhosis of liver with ascites: Secondary | ICD-10-CM | POA: Diagnosis not present

## 2021-06-01 LAB — COMPREHENSIVE METABOLIC PANEL WITH GFR
ALT: 39 U/L (ref 0–44)
AST: 60 U/L — ABNORMAL HIGH (ref 15–41)
Albumin: 2.6 g/dL — ABNORMAL LOW (ref 3.5–5.0)
Alkaline Phosphatase: 60 U/L (ref 38–126)
Anion gap: 7 (ref 5–15)
BUN: 19 mg/dL (ref 8–23)
CO2: 25 mmol/L (ref 22–32)
Calcium: 9.2 mg/dL (ref 8.9–10.3)
Chloride: 103 mmol/L (ref 98–111)
Creatinine, Ser: 1.15 mg/dL (ref 0.61–1.24)
GFR, Estimated: >60 mL/min
Glucose, Bld: 128 mg/dL — ABNORMAL HIGH (ref 70–99)
Potassium: 4.9 mmol/L (ref 3.5–5.1)
Sodium: 135 mmol/L (ref 135–145)
Total Bilirubin: 3.8 mg/dL — ABNORMAL HIGH (ref 0.3–1.2)
Total Protein: 5.6 g/dL — ABNORMAL LOW (ref 6.5–8.1)

## 2021-06-01 LAB — LIPASE, BLOOD: Lipase: 71 U/L — ABNORMAL HIGH (ref 11–51)

## 2021-06-01 MED ORDER — PANTOPRAZOLE SODIUM 40 MG PO TBEC
40.0000 mg | DELAYED_RELEASE_TABLET | Freq: Every day | ORAL | Status: DC
  Administered 2021-06-01 – 2021-06-03 (×3): 40 mg via ORAL
  Filled 2021-06-01 (×3): qty 1

## 2021-06-01 MED ORDER — LIDOCAINE VISCOUS HCL 2 % MT SOLN
15.0000 mL | Freq: Once | OROMUCOSAL | Status: AC | PRN
  Administered 2021-06-01: 15 mL via ORAL
  Filled 2021-06-01: qty 15

## 2021-06-01 MED ORDER — HYDROMORPHONE HCL 1 MG/ML IJ SOLN
0.5000 mg | Freq: Once | INTRAMUSCULAR | Status: AC
Start: 2021-06-01 — End: 2021-06-01
  Administered 2021-06-01: 0.5 mg via INTRAVENOUS
  Filled 2021-06-01: qty 1

## 2021-06-01 MED ORDER — ALUM & MAG HYDROXIDE-SIMETH 200-200-20 MG/5ML PO SUSP
30.0000 mL | Freq: Once | ORAL | Status: AC
  Administered 2021-06-01: 30 mL via ORAL
  Filled 2021-06-01: qty 30

## 2021-06-01 MED ORDER — IOHEXOL 9 MG/ML PO SOLN
ORAL | Status: AC
  Filled 2021-06-01: qty 1000

## 2021-06-01 NOTE — Progress Notes (Signed)
Pt has been complaining of severe mid epigastric pain. [place] Crane, MD made aware; see new orders. Will continue to monitor.   Elaina Hoops, RN

## 2021-06-01 NOTE — Progress Notes (Addendum)
PROGRESS NOTE    ENDRIT GITTINS  TOI:[id number] DOB: 09/19/1946 DOA: 05/27/2021 PCP: [name], MD    Chief Complaint  Patient presents with   Near Syncope   Hypotension    Brief Narrative:   Peter Nichols is a 75 y.o. male with medical history significant of HTN, HLD, prior EtOH abuse.  Pt recently admitted for hepatic encephalopathy and hyperammonemia in setting of EtOH cirrhosis.  Treated with lactulose and discharged home on 05/24/21. Presents to ED for recurrent near syncope. He was found to be profoundly hypotensive probably from over diuresis/ paracentesis and anti hypertensive's and in AKI. His BP improved with fluids and he was started on lasix. Overnight pt had epigastric pain and his lipase levels were elevated.   Assessment & Plan:   Principal Problem:   AKI (acute kidney injury) ([place]) Active Problems:   Hyperlipidemia   Hypertension   Alcoholic cirrhosis of liver with ascites ([place])   Near syncope   Near syncope probably secondary to hypotension.  Probably secondary to over diuresis , with addition of lasix , norvasc, lotensin, , propranolol and use of chlorthalidone.  All BP meds and diuretics discontinued on admission.  Pt received about 2 lit of fluids on admission, and was started on RL at 161m/hr.  BP normalized. We have stopped the fluids  Orthostatic vs negative. Therapy evaluations did not recommend any supervision or follow up.  EKG shows sinus rhythm.  Pt is currently asymptomatic, denies any dizziness, headache.   AKI  Probably from hypovolemia and dehydration. Admitted with a creatinine of 2. Baseline hemoglobin around 1.  Slowly improving.  Creatinine normalized.  Good urine output.   Epigastric pain overnight, / acute pancreatitis.  Lipase elevated probably acute pancreatitis.  CT abdomen and pelvis without contrast ordered for further evaluation.  Changed diet to clears for now.    cirrhosis from alcohol use with ascites.  With  improvement in the renal parameters and BP, we will go ahead and restart the lasix and increased the dose to lasix 40 mg daily..  [place] showed mod ascites . [place]aracentesis done today.    Hepatic encephalopathy:  Appears to have resolved. Pt alert and oriented.  Ammonia level slightly elevated at 73.  Increase lactulose to tid. Target atleast 2 to 3 bowel movements per day.     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  Hemoglobin between 9 to 10.    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  Monitor counts.     Hyperlipidemia:  Resume statin.    Hyponatremia:  Suspect probably from dehydration and RL fluids.  Resolved.  TSH elevated but free t4 wnl.     Hyperkalemia: Resolved.  Increased the lasix to 40 mg daily.     DVT prophylaxis: SCD's Code Status: (Full Code) Family Communication: family at bedside.  Disposition:   Status is: Inpatient.   The patient will require care spanning > 2 midnights and should be moved to inpatient because: Unsafe d/c plan and Inpatient level of care appropriate due to severity of illness, acute pancreatitis.   Dispo: The patient is from: Home              Anticipated d/c is to: Home              Patient currently is not medically stable to d/c.   Difficult to place patient No       Consultants:  None.   Procedures: None.   Antimicrobials: None.  Subjective:  Epigastric pain, slightly nauseated. No vomiting.  Objective: Vitals:   06/01/21 0513 06/01/21 0800 06/01/21 0801 06/01/21 1136  BP: (!) 127/56 134/65 134/65 101/85  Pulse:   67 66  Resp: 20  20 18   Temp: 98 F (36.7 C)  98.2 F (36.8 C) 98.4 F (36.9 C)  TempSrc: Oral  Oral Oral  SpO2: 98%     Weight:      Height:       No intake or output data in the 24 hours ending 06/01/21 1335  Filed Weights   05/27/21 1524 05/28/21 1547 05/29/21 1538  Weight: 85.7 kg 92 kg 92.2 kg    Examination:  General exam: alert and mildly in  distress from abd pain.  Respiratory system: air entry fair, no wheezing heard.  Cardiovascular system: RRR, no JVD, no pedal edema.  Gastrointestinal system: Abdomen is soft, tender in the epigastric area, distended bowel sounds wnl.  Central nervous system: Alert and oriented non focal Extremities:no pedal edema.  Skin: No rashes seen.  Psychiatry:  Mood is appropriate.      Data Reviewed: I have personally reviewed following labs and imaging studies  CBC: Recent Labs  Lab 05/27/21 1634 05/28/21 0401 05/31/21 0344  WBC 6.9 6.3 4.2  NEUTROABS 3.5  --   --   HGB 10.9* 9.4* 9.4*  HCT 31.0* 27.1* 26.7*  MCV 100.0 99.3 101.1*  PLT 115* 87* 74*     Basic Metabolic Panel: Recent Labs  Lab 05/28/21 0401 05/29/21 0305 05/30/21 0909 05/31/21 0344 06/01/21 0711  NA 130* 131* 132* 133* 135  K 3.8 4.8 4.6 5.3* 4.9  CL 102 101 103 103 103  CO2 21* 24 25 24 25   GLUCOSE 115* 123* 139* 95 128*  BUN 26* 28* 24* 21 19  CREATININE 1.85* 1.66* 1.28* 1.08 1.15  CALCIUM 7.8* 8.5* 9.1 9.5 9.2     GFR: Estimated Creatinine Clearance: 63.2 mL/min (by C-G formula based on SCr of 1.15 mg/dL).  Liver Function Tests: Recent Labs  Lab 05/27/21 1643 05/28/21 0401 06/01/21 0711  AST 40 36 60*  ALT 36 30 39  ALKPHOS 58 47 60  BILITOT 3.4* 2.4* 3.8*  PROT 5.3* 4.4* 5.6*  ALBUMIN 2.5* 2.1* 2.6*     CBG: Recent Labs  Lab 05/27/21 1628  GLUCAP 102*      Recent Results (from the past 240 hour(s))  Resp Panel by RT-PCR (Flu A&B, Covid) Nasopharyngeal Swab     Status: None   Collection Time: 05/22/21  3:00 PM   Specimen: Nasopharyngeal Swab; Nasopharyngeal(NP) swabs in vial transport medium  Result Value Ref Range Status   SARS Coronavirus 2 by RT PCR NEGATIVE NEGATIVE Final    Comment: (NOTE) SARS-CoV-2 target nucleic acids are NOT DETECTED.  The SARS-CoV-2 RNA is generally detectable in upper respiratory specimens during the acute phase of infection. The  lowest concentration of SARS-CoV-2 viral copies this assay can detect is 138 copies/mL. A negative result does not preclude SARS-Cov-2 infection and should not be used as the sole basis for treatment or other patient management decisions. A negative result may occur with  improper specimen collection/handling, submission of specimen other than nasopharyngeal swab, presence of viral mutation(s) within the areas targeted by this assay, and inadequate number of viral copies(<138 copies/mL). A negative result must be combined with clinical observations, patient history, and epidemiological information. The expected result is Negative.  Fact Sheet for Patients:  EntrepreneurPulse.com.au  Fact Sheet for Healthcare Providers:  IncredibleEmployment.be  This test is no t yet approved or cleared by the [place] and  has been authorized for detection and/or diagnosis of SARS-CoV-2 by FDA under an Emergency Use Authorization (EUA). This EUA will remain  in effect (meaning this test can be used) for the duration of the COVID-19 declaration under Section 564(b)(1) of the Act, 21 U.S.C.section 360bbb-3(b)(1), unless the authorization is terminated  or revoked sooner.       Influenza A by PCR NEGATIVE NEGATIVE Final   Influenza B by PCR NEGATIVE NEGATIVE Final    Comment: (NOTE) The Xpert Xpress SARS-CoV-2/FLU/RSV plus assay is intended as an aid in the diagnosis of influenza from Nasopharyngeal swab specimens and should not be used as a sole basis for treatment. Nasal washings and aspirates are unacceptable for Xpert Xpress SARS-CoV-2/FLU/RSV testing.  Fact Sheet for Patients: EntrepreneurPulse.com.au  Fact Sheet for Healthcare Providers: IncredibleEmployment.be  This test is not yet approved or cleared by the [place] FDA and has been authorized for detection and/or diagnosis of SARS-CoV-2 by FDA under  an Emergency Use Authorization (EUA). This EUA will remain in effect (meaning this test can be used) for the duration of the COVID-19 declaration under Section 564(b)(1) of the Act, 21 U.S.C. section 360bbb-3(b)(1), unless the authorization is terminated or revoked.  Performed at [facility], [place] [address], [place], [place] [address]   Body fluid culture w Gram Stain     Status: None   Collection Time: 05/22/21  4:03 PM   Specimen: PATH Cytology Peritoneal fluid  Result Value Ref Range Status   Specimen Description   Final    PERITONEAL Performed at [place] [address], [place], [place] [address]    Special Requests   Final    NONE Performed at [facility], [place] [address], [place], [place] [address]    Gram Stain   Final    WBC PRESENT, PREDOMINANTLY MONONUCLEAR NO ORGANISMS SEEN CYTOSPIN SMEAR    Culture   Final    NO GROWTH Performed at [facility], [place] [address], [place], [place] [address]    Report Status 05/26/2021 FINAL  Final  SARS CORONAVIRUS 2 (TAT 6-24 HRS) Nasopharyngeal Nasopharyngeal Swab     Status: None   Collection Time: 05/28/21  6:14 PM   Specimen: Nasopharyngeal Swab  Result Value Ref Range Status   SARS Coronavirus 2 NEGATIVE NEGATIVE Final    Comment: (NOTE) SARS-CoV-2 target nucleic acids are NOT DETECTED.  The SARS-CoV-2 RNA is generally detectable in upper and lower respiratory specimens during the acute phase of infection. Negative results do not preclude SARS-CoV-2 infection, do not rule out co-infections with other pathogens, and should not be used as the sole basis for treatment or other patient management decisions. Negative results must be combined with clinical observations, patient history, and epidemiological information. The expected result is Negative.  Fact Sheet for Patients: SugarRoll.be  Fact Sheet for Healthcare  Providers: https://www.woods-mathews.com/  This test is not yet approved or cleared by the [place] FDA and  has been authorized for detection and/or diagnosis of SARS-CoV-2 by FDA under an Emergency Use Authorization (EUA). This EUA will remain  in effect (meaning this test can be used) for the duration of the COVID-19 declaration under Se ction 564(b)(1) of the Act, 21 U.S.C. section 360bbb-3(b)(1), unless the authorization is terminated or revoked sooner.  Performed at [facility], [place] [address], [place], [place] [address]  Radiology Studies: US Abdomen Limited  Result Date: 05/30/2021 CLINICAL DATA:  Abdominal distension EXAM: LIMITED ABDOMEN ULTRASOUND FOR ASCITES TECHNIQUE: Limited ultrasound survey for ascites was performed in all four abdominal quadrants. COMPARISON:  None. FINDINGS: Scanning throughout the abdomen shows ascites in all 4 abdominal quadrants. Largest pocket is noted in the right lower quadrant. IMPRESSION: Scattered mild to moderate ascites. Electronically Signed   By: Inez Catalina M.D.   On: 05/30/2021 17:30   US Paracentesis  Result Date: 05/31/2021 INDICATION: Patient with history of alcoholic cirrhosis and recurrent ascites presents today for a therapeutic paracentesis up to 1 L. EXAM: ULTRASOUND GUIDED PARACENTESIS MEDICATIONS: 1% lidocaine 10 mL COMPLICATIONS: None immediate. PROCEDURE: Informed written consent was obtained from the patient after a discussion of the risks, benefits and alternatives to treatment. A timeout was performed prior to the initiation of the procedure. Initial ultrasound scanning demonstrates a large amount of ascites within the right lower abdominal quadrant. The right lower abdomen was prepped and draped in the usual sterile fashion. 1% lidocaine was used for local anesthesia. Following this, a 19 gauge, 7-cm, Yueh catheter was introduced. An ultrasound image was saved for documentation purposes. The  paracentesis was performed. The catheter was removed and a dressing was applied. The patient tolerated the procedure well without immediate post procedural complication. FINDINGS: A total of approximately 1 L of clear yellow fluid was removed. IMPRESSION: Successful ultrasound-guided paracentesis yielding 1 liters of peritoneal fluid. Read by: Soyla Dryer, NP Electronically Signed   By: Corrie Mckusick D.O.   On: 05/31/2021 14:24        Scheduled Meds:  AeroChamber Plus Flo-Vu Large  1 each Other Once   feeding supplement  237 mL Oral BID BM   furosemide  40 mg Oral Daily   lactulose  20 g Oral TID   multivitamin with minerals  1 tablet Oral Daily   pantoprazole  40 mg Oral Q0600   pravastatin  40 mg Oral Daily   Continuous Infusions:   LOS: 3 days        Hosie Poisson, MD [facility]   To contact the attending provider between 7A-7P or the covering provider during after hours 7P-7A, please log into the web site [email or web address] and access using universal [place] password for that web site. If you do not have the password, please call the hospital operator.  06/01/2021, 1:35 PM

## 2021-06-01 NOTE — Progress Notes (Signed)
C/O nausea and abdominal pain. Zofran 4 mg IV given. States relief of nausea and now resting w/o c/o. Family @ bedside.

## 2021-06-02 DIAGNOSIS — K7031 Alcoholic cirrhosis of liver with ascites: Secondary | ICD-10-CM | POA: Diagnosis not present

## 2021-06-02 DIAGNOSIS — N179 Acute kidney failure, unspecified: Secondary | ICD-10-CM | POA: Diagnosis not present

## 2021-06-02 LAB — CBC
HCT: 26.5 % — ABNORMAL LOW (ref 39.0–52.0)
Hemoglobin: 9.4 g/dL — ABNORMAL LOW (ref 13.0–17.0)
MCH: 35.3 pg — ABNORMAL HIGH (ref 26.0–34.0)
MCHC: 35.5 g/dL (ref 30.0–36.0)
MCV: 99.6 fL (ref 80.0–100.0)
Platelets: 64 10*3/uL — ABNORMAL LOW (ref 150–400)
RBC: 2.66 MIL/uL — ABNORMAL LOW (ref 4.22–5.81)
RDW: 14.1 % (ref 11.5–15.5)
WBC: 5.3 10*3/uL (ref 4.0–10.5)
nRBC: 0 % (ref 0.0–0.2)

## 2021-06-02 LAB — BASIC METABOLIC PANEL
Anion gap: 5 (ref 5–15)
BUN: 18 mg/dL (ref 8–23)
CO2: 24 mmol/L (ref 22–32)
Calcium: 8.7 mg/dL — ABNORMAL LOW (ref 8.9–10.3)
Chloride: 104 mmol/L (ref 98–111)
Creatinine, Ser: 1.12 mg/dL (ref 0.61–1.24)
GFR, Estimated: >60 mL/min (ref >60)
Glucose, Bld: 97 mg/dL (ref 70–99)
Potassium: 5.2 mmol/L — ABNORMAL HIGH (ref 3.5–5.1)
Sodium: 133 mmol/L — ABNORMAL LOW (ref 135–145)

## 2021-06-02 LAB — MAGNESIUM: Magnesium: 1.7 mg/dL (ref 1.7–2.4)

## 2021-06-02 LAB — LIPASE, BLOOD: Lipase: 51 U/L (ref 11–51)

## 2021-06-02 LAB — AMMONIA: Ammonia: 29 umol/L (ref 9–35)

## 2021-06-02 MED ORDER — FUROSEMIDE 20 MG PO TABS
20.0000 mg | ORAL_TABLET | Freq: Every day | ORAL | Status: DC
  Administered 2021-06-03: 20 mg via ORAL
  Filled 2021-06-02: qty 1

## 2021-06-02 MED ORDER — PROPRANOLOL HCL 10 MG PO TABS
10.0000 mg | ORAL_TABLET | Freq: Two times a day (BID) | ORAL | Status: DC
  Administered 2021-06-02 – 2021-06-03 (×3): 10 mg via ORAL
  Filled 2021-06-02 (×5): qty 1

## 2021-06-02 NOTE — Progress Notes (Signed)
PROGRESS NOTE    Peter Nichols  MEQ:[id number] DOB: [birth year] DOA: 05/27/2021 PCP: [name], MD    Chief Complaint  Patient presents with   Near Syncope   Hypotension    Brief Narrative:   Peter Nichols is a 75 y.o. male with medical history significant of HTN, HLD, prior EtOH abuse.  Pt recently admitted for hepatic encephalopathy and hyperammonemia in setting of EtOH cirrhosis.  Treated with lactulose and discharged home on 05/24/21. Presents to ED for recurrent near syncope. He was found to be profoundly hypotensive probably from over diuresis/ paracentesis and anti hypertensive's and in AKI. His BP improved with fluids and he was started on lasix. Overnight pt had epigastric pain and his lipase levels were elevated.   Assessment & Plan:   Principal Problem:   AKI (acute kidney injury) ([place]) Active Problems:   Hyperlipidemia   Hypertension   Alcoholic cirrhosis of liver with ascites (HCC)   Near syncope   Pancreatitis, acute   Near syncope probably secondary to hypotension.  Probably secondary to over diuresis , with addition of lasix , norvasc, lotensin, , propranolol and use of chlorthalidone.  All BP meds and diuretics discontinued on admission.  Pt received about 2 lit of fluids on admission, and was started on RL at 137m/hr.  -Appeared to be a euvolemic today with stable blood pressure, he is on Lasix 40 mg oral daily, presents of volume overload, will not be able to add any Aldactone given potassium usually on the higher side, I will go ahead and decrease his Lasix to 20 mg oral daily and add low-dose propranolol and watch him over next 24 hours. - will consult PT   AKI  Probably from hypovolemia and dehydration. Admitted with a creatinine of 2. Baseline hemoglobin around 1.  Slowly improving.  Creatinine normalized.  Good urine output.   Abdominal  pain on 8/14 -Has resolved, no evidence of acute pancreatitis   cirrhosis from alcohol use with ascites.   With improvement in the renal parameters and BP -Paracentesis 8/14, 1 L drained   Hepatic encephalopathy:  Appears to have resolved. Pt alert and oriented.  Ammonia level slightly elevated at 73.  Increase lactulose to tid. Target atleast 2 to 3 bowel movements per day.   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  Hemoglobin between 9 to 10.    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  Monitor counts.   Hyperlipidemia:  Resume statin.    Hyponatremia:  Suspect probably from dehydration and RL fluids.  Resolved.  TSH elevated but free t4 wnl.   Hyperkalemia: -Hold on adding Aldactone this admission    DVT prophylaxis: SCD's Code Status: (Full Code) Family Communication: none at bedside.  Disposition:   Status is: Inpatient.   The patient will require care spanning > 2 midnights and should be moved to inpatient because: Unsafe d/c plan and Inpatient level of care appropriate due to severity of illness, acute pancreatitis.   Dispo: The patient is from: Home              Anticipated d/c is to: Home              Patient currently is not medically stable to d/c.   Difficult to place patient No       Consultants:  None.   Procedures: None.   Antimicrobials: None.    Subjective:  Denies any abdominal pain, nausea or vomiting today. Objective: Vitals:   06/01/21 0801  06/01/21 1136 06/01/21 2119 06/02/21 0416  BP: 134/65 101/85 (!) 137/58 (!) 123/59  Pulse: 67 66 62 66  Resp: 20 18 18 15   Temp: 98.2 F (36.8 C) 98.4 F (36.9 C) 98.3 F (36.8 C) 98.4 F (36.9 C)  TempSrc: Oral Oral Oral Oral  SpO2:   96% 93%  Weight:      Height:       No intake or output data in the 24 hours ending 06/02/21 1150  Filed Weights   05/27/21 1524 05/28/21 1547 05/29/21 1538  Weight: 85.7 kg 92 kg 92.2 kg    Examination: Awake Alert, Oriented X 3, No new F.N deficits, Normal affect Symmetrical Chest wall movement, Good air movement bilaterally,  CTAB RRR,No Gallops,Rubs or new Murmurs, No Parasternal Heave +ve B.Sounds, Abd Soft, No tenderness, No rebound - guarding or rigidity. No Cyanosis, Clubbing or edema, No new Rash or bruise      Data Reviewed: I have personally reviewed following labs and imaging studies  CBC: Recent Labs  Lab 05/27/21 1634 05/28/21 0401 05/31/21 0344 06/02/21 0155  WBC 6.9 6.3 4.2 5.3  NEUTROABS 3.5  --   --   --   HGB 10.9* 9.4* 9.4* 9.4*  HCT 31.0* 27.1* 26.7* 26.5*  MCV 100.0 99.3 101.1* 99.6  PLT 115* 87* 74* 64*    Basic Metabolic Panel: Recent Labs  Lab 05/29/21 0305 05/30/21 0909 05/31/21 0344 06/01/21 0711 06/02/21 0155  NA 131* 132* 133* 135 133*  K 4.8 4.6 5.3* 4.9 5.2*  CL 101 103 103 103 104  CO2 24 25 24 25 24   GLUCOSE 123* 139* 95 128* 97  BUN 28* 24* 21 19 18   CREATININE 1.66* 1.28* 1.08 1.15 1.12  CALCIUM 8.5* 9.1 9.5 9.2 8.7*  MG  --   --   --   --  1.7    GFR: Estimated Creatinine Clearance: 64.9 mL/min (by C-G formula based on SCr of 1.12 mg/dL).  Liver Function Tests: Recent Labs  Lab 05/27/21 1643 05/28/21 0401 06/01/21 0711  AST 40 36 60*  ALT 36 30 39  ALKPHOS 58 47 60  BILITOT 3.4* 2.4* 3.8*  PROT 5.3* 4.4* 5.6*  ALBUMIN 2.5* 2.1* 2.6*    CBG: Recent Labs  Lab 05/27/21 1628  GLUCAP 102*     Recent Results (from the past 240 hour(s))  SARS CORONAVIRUS 2 (TAT 6-24 HRS) Nasopharyngeal Nasopharyngeal Swab     Status: None   Collection Time: 05/28/21  6:14 PM   Specimen: Nasopharyngeal Swab  Result Value Ref Range Status   SARS Coronavirus 2 NEGATIVE NEGATIVE Final    Comment: (NOTE) SARS-CoV-2 target nucleic acids are NOT DETECTED.  The SARS-CoV-2 RNA is generally detectable in upper and lower respiratory specimens during the acute phase of infection. Negative results do not preclude SARS-CoV-2 infection, do not rule out co-infections with other pathogens, and should not be used as the sole basis for treatment or other patient  management decisions. Negative results must be combined with clinical observations, patient history, and epidemiological information. The expected result is Negative.  Fact Sheet for Patients: SugarRoll.be  Fact Sheet for Healthcare Providers: https://www.woods-mathews.com/  This test is not yet approved or cleared by the [place] FDA and  has been authorized for detection and/or diagnosis of SARS-CoV-2 by FDA under an Emergency Use Authorization (EUA). This EUA will remain  in effect (meaning this test can be used) for the duration of the COVID-19 declaration under Se ction 564(b)(1) of  the Act, 21 U.S.C. section 360bbb-3(b)(1), unless the authorization is terminated or revoked sooner.  Performed at [facility], [place] [address] [place], [place] [address]           Radiology Studies: CT ABDOMEN PELVIS WO CONTRAST  Result Date: 06/01/2021 CLINICAL DATA:  Acute abdominal pain. Suspect pancreatitis. Admitted with hepatic encephalopathy and hyperammonemia. EXAM: CT ABDOMEN AND PELVIS WITHOUT CONTRAST TECHNIQUE: Multidetector CT imaging of the abdomen and pelvis was performed following the standard protocol without IV contrast. COMPARISON:  Most recent CT 04/23/2021. FINDINGS: Lower chest: Small bilateral pleural effusions, right greater than left. Basilar calcifications are similar. Hepatobiliary: Superior aspect of the liver is not included in the field of view. Cirrhotic hepatic morphology with nodular contours. Small cysts in the right and left lobe are similar to prior exam. No obvious new lesion. Cholecystectomy. Pancreas: Fat stranding about the pancreas is similar to the generalized degree of mesenteric edema throughout the abdomen and pelvis. There is no peripancreatic collection. No pancreatic ductal dilatation. Tiny sliver of gas between the pancreas and splenic vein, series 3, image 21, may be related to recent paracentesis.  This does not appear to be within the pancreatic parenchyma or vasculature. Spleen: Again seen splenomegaly, greatest splenic dimension 14.8 cm. Adrenals/Urinary Tract: Normal adrenal glands. Hydronephrosis. No renal calculi. No evidence of focal renal abnormality. Partially distended urinary bladder. The dome of the urinary bladder extends towards the left internal inguinal ring. This is similar to prior. Stomach/Bowel: Small hiatal hernia. Decompressed stomach. Small duodenal diverticulum. Enteric contrast is seen to the distal small bowel. Single patulous loop of small bowel centrally, but no obstruction. There is minimal small bowel diverticulosis. Colonic assessment is limited due to lack of enteric contrast and presence of ascites. Left colonic diverticulosis. No evident diverticulitis. Appendix is not well seen on the current exam. Vascular/Lymphatic: Aortic atherosclerosis. No aortic aneurysm. There is no portal venous or mesenteric gas. Limited assessment for adenopathy given ascites. No bulky enlarged lymph nodes. Reproductive: Prostate is unremarkable. Other: Small to moderate abdominopelvic ascites. Patient underwent paracentesis yesterday, with small foci of gas in the anterior abdominal wall, upper abdomen, and intermittently throughout the mesentery. Ascites tracks into the right inguinal canal. There is generalized mesenteric and body wall edema. Musculoskeletal: There are no acute or suspicious osseous abnormalities. Chronic and degenerative change in the pelvis and lumbar spine. Irregularity of the left iliac bone is likely due to remote injury. IMPRESSION: 1. No convincing findings of acute pancreatitis on the current exam. There is peripancreatic edema, however that is overall similar to the generalized mesenteric edema and stranding throughout the abdomen related to cirrhosis. No evidence of acute peripancreatic collection. 2. Small to moderate abdominopelvic ascites. Patient underwent  paracentesis yesterday, with small foci of gas in the anterior abdominal wall, upper abdomen, and intermittently throughout the mesentery. Small focus of gas posterior to the pancreas is also likely related to paracentesis, this does not appear to be within the pancreatic parenchyma to suggest necrosis or adjacent vasculature. 3. Cirrhosis and splenomegaly. 4. Small bilateral pleural effusions, right greater than left. Generalized mesenteric and body wall edema. 5. Colonic diverticulosis without diverticulitis. Aortic Atherosclerosis (ICD10-I70.0). Electronically Signed   By: Keith Rake M.D.   On: 06/01/2021 17:51   US Paracentesis  Result Date: 05/31/2021 INDICATION: Patient with history of alcoholic cirrhosis and recurrent ascites presents today for a therapeutic paracentesis up to 1 L. EXAM: ULTRASOUND GUIDED PARACENTESIS MEDICATIONS: 1% lidocaine 10 mL COMPLICATIONS: None immediate. PROCEDURE:  Informed written consent was obtained from the patient after a discussion of the risks, benefits and alternatives to treatment. A timeout was performed prior to the initiation of the procedure. Initial ultrasound scanning demonstrates a large amount of ascites within the right lower abdominal quadrant. The right lower abdomen was prepped and draped in the usual sterile fashion. 1% lidocaine was used for local anesthesia. Following this, a 19 gauge, 7-cm, Yueh catheter was introduced. An ultrasound image was saved for documentation purposes. The paracentesis was performed. The catheter was removed and a dressing was applied. The patient tolerated the procedure well without immediate post procedural complication. FINDINGS: A total of approximately 1 L of clear yellow fluid was removed. IMPRESSION: Successful ultrasound-guided paracentesis yielding 1 liters of peritoneal fluid. Read by: Soyla Dryer, NP Electronically Signed   By: Corrie Mckusick D.O.   On: 05/31/2021 14:24        Scheduled Meds:   AeroChamber Plus Flo-Vu Large  1 each Other Once   feeding supplement  237 mL Oral BID BM   [START ON 06/03/2021] furosemide  20 mg Oral Daily   lactulose  20 g Oral TID   multivitamin with minerals  1 tablet Oral Daily   pantoprazole  40 mg Oral Q0600   pravastatin  40 mg Oral Daily   propranolol  10 mg Oral BID   Continuous Infusions:   LOS: 4 days        Phillips Climes, MD [facility]   To contact the attending provider between 7A-7P or the covering provider during after hours 7P-7A, please log into the web site [email or web address] and access using universal [place] password for that web site. If you do not have the password, please call the hospital operator.  06/02/2021, 11:50 AM

## 2021-06-02 NOTE — Care Management Important Message (Signed)
Important Message  Patient Details  Name: Peter Nichols MRN: [id number] Date of Birth: 1946-09-08   Medicare Important Message Given:  Yes     Shelda Altes 06/02/2021, 10:16 AM

## 2021-06-03 DIAGNOSIS — K7031 Alcoholic cirrhosis of liver with ascites: Secondary | ICD-10-CM | POA: Diagnosis not present

## 2021-06-03 DIAGNOSIS — N179 Acute kidney failure, unspecified: Secondary | ICD-10-CM | POA: Diagnosis not present

## 2021-06-03 DIAGNOSIS — R55 Syncope and collapse: Secondary | ICD-10-CM | POA: Diagnosis not present

## 2021-06-03 LAB — CBC
HCT: 25.5 % — ABNORMAL LOW (ref 39.0–52.0)
Hemoglobin: 9 g/dL — ABNORMAL LOW (ref 13.0–17.0)
MCH: 35.4 pg — ABNORMAL HIGH (ref 26.0–34.0)
MCHC: 35.3 g/dL (ref 30.0–36.0)
MCV: 100.4 fL — ABNORMAL HIGH (ref 80.0–100.0)
Platelets: 66 10*3/uL — ABNORMAL LOW (ref 150–400)
RBC: 2.54 MIL/uL — ABNORMAL LOW (ref 4.22–5.81)
RDW: 14.1 % (ref 11.5–15.5)
WBC: 5.1 10*3/uL (ref 4.0–10.5)
nRBC: 0 % (ref 0.0–0.2)

## 2021-06-03 LAB — BASIC METABOLIC PANEL
Anion gap: 5 (ref 5–15)
BUN: 16 mg/dL (ref 8–23)
CO2: 26 mmol/L (ref 22–32)
Calcium: 8.6 mg/dL — ABNORMAL LOW (ref 8.9–10.3)
Chloride: 103 mmol/L (ref 98–111)
Creatinine, Ser: 1.13 mg/dL (ref 0.61–1.24)
GFR, Estimated: >60 mL/min (ref >60)
Glucose, Bld: 105 mg/dL — ABNORMAL HIGH (ref 70–99)
Potassium: 4.3 mmol/L (ref 3.5–5.1)
Sodium: 134 mmol/L — ABNORMAL LOW (ref 135–145)

## 2021-06-03 MED ORDER — LACTULOSE 10 GM/15ML PO SOLN: 20.0000 g | Freq: Three times a day (TID) | ORAL | 0 refills | Status: DC

## 2021-06-03 MED ORDER — PROPRANOLOL HCL 10 MG PO TABS: 10.0000 mg | ORAL_TABLET | Freq: Two times a day (BID) | ORAL | 0 refills | Status: AC

## 2021-06-03 MED ORDER — FUROSEMIDE 20 MG PO TABS: 20.0000 mg | ORAL_TABLET | Freq: Every day | ORAL | 0 refills | Status: DC

## 2021-06-03 MED ORDER — POTASSIUM CHLORIDE CRYS ER 20 MEQ PO TBCR: 20.0000 meq | EXTENDED_RELEASE_TABLET | ORAL | Status: DC

## 2021-06-03 NOTE — Plan of Care (Signed)

## 2021-06-03 NOTE — Discharge Instructions (Signed)
Follow with Primary MD [name], MD in 7 days   Get CBC, CMP,  checked  by Primary MD next visit.    Activity: As tolerated with Full fall precautions use walker/cane & assistance as needed   Disposition Home    Diet: Heart Healthy low salt, with feeding assistance and aspiration precautions.  For Heart failure patients - Check your Weight same time everyday, if you gain over 2 pounds, or you develop in leg swelling, experience more shortness of breath or chest pain, call your Primary MD immediately. Follow Cardiac Low Salt Diet and 1.5 lit/day fluid restriction.   On your next visit with your primary care physician please Get Medicines reviewed and adjusted.   Please request your Prim.MD to go over all Hospital Tests and Procedure/Radiological results at the follow up, please get all Hospital records sent to your Prim MD by signing hospital release before you go home.   If you experience worsening of your admission symptoms, develop shortness of breath, life threatening emergency, suicidal or homicidal thoughts you must seek medical attention immediately by calling 911 or calling your MD immediately  if symptoms less severe.  You Must read complete instructions/literature along with all the possible adverse reactions/side effects for all the Medicines you take and that have been prescribed to you. Take any new Medicines after you have completely understood and accpet all the possible adverse reactions/side effects.   Do not drive, operating heavy machinery, perform activities at heights, swimming or participation in water activities or provide baby sitting services if your were admitted for syncope or siezures until you have seen by Primary MD or a Neurologist and advised to do so again.  Do not drive when taking Pain medications.    Do not take more than prescribed Pain, Sleep and Anxiety Medications  Special Instructions: If you have smoked or chewed Tobacco  in the last 2  yrs please stop smoking, stop any regular Alcohol  and or any Recreational drug use.  Wear Seat belts while driving.   Please note  You were cared for by a hospitalist during your hospital stay. If you have any questions about your discharge medications or the care you received while you were in the hospital after you are discharged, you can call the unit and asked to speak with the hospitalist on call if the hospitalist that took care of you is not available. Once you are discharged, your primary care physician will handle any further medical issues. Please note that NO REFILLS for any discharge medications will be authorized once you are discharged, as it is imperative that you return to your primary care physician (or establish a relationship with a primary care physician if you do not have one) for your aftercare needs so that they can reassess your need for medications and monitor your lab values.

## 2021-06-03 NOTE — Discharge Summary (Signed)
Physician Discharge Summary  OGLE HOEFFNER OZH:[id number] DOB: 11-27-1945 DOA: 05/27/2021  PCP: [name], MD  Admit date: 05/27/2021 Discharge date: 06/03/2021  Admitted From: Home Disposition:  Home  Recommendations for Outpatient Follow-up:  Follow up with PCP in 1-2 weeks Please obtain CMP/CBC in one week Patient was instructed to keep his appointment with his GI physician this coming Thursday.  Home Health:NO Equipment/Devices:None  Discharge Condition:Stable CODE STATUS:FULL Diet recommendation: Heart Healthy  Brief/Interim Summary:    Disc   Near syncope probably secondary to hypotension.  Probably secondary to over diuresis , with addition of lasix , norvasc, lotensin, , propranolol and use of chlorthalidone.  All BP meds and diuretics discontinued on admission.  Pt received about 2 lit of fluids on admission, and was started on RL at 128m/hr.  -Appeared to be a euvolemic today with stable blood pressure, discharge medications has been adjusted, he will only be discharged on Lasix 20 mg oral daily and propranolol 10 mg oral twice daily .    AKI  Probably from hypovolemia and dehydration. Admitted with a creatinine of 2. Baseline hemoglobin around 1.    Creatinine normalized.  Good urine output.    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  With improvement in the renal parameters and BP -Paracentesis 8/14, 1 L drained -Patient will be discharged on low-dose Lasix 20 mg oral daily, and low-dose propranolol 10 mg oral twice daily, there is no room to add any Aldactone especially with intermittent hyperkalemia during hospital stay.     Hepatic encephalopathy:  Appears to have resolved. Pt alert and oriented.  Ammonia level slightly elevated at 73.  Increase lactulose to tid.  Ammonia is 29 yesterday, patient was instructed to adjust dosing to target around 3 bowel movements per  day.   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  Hemoglobin between 9 to 10.      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  Monitor counts.    Hyperlipidemia:  Resume statin.      Hyponatremia:  Suspect probably from dehydration and RL fluids.  Resolved.  TSH elevated but free t4 wnl.    Hyperkalemia: -Hold on adding Aldactone this admission    harge Diagnoses:  Principal Problem:   [place] Active Problems:   Hyperlipidemia   Hypertension   Alcoholic cirrhosis of liver with ascites [place]   Near syncope   Pancreatitis, acute    Discharge Instructions  Discharge Instructions     Diet - low sodium heart healthy   Complete by: As directed    Discharge instructions   Complete by: As directed    Follow with Primary MD WHarlan Stains MD in 7 days   Get CBC, CMP,  checked  by Primary MD next visit.    Activity: As tolerated with Full fall precautions use walker/cane & assistance as needed   Disposition Home    Diet: Heart Healthy low salt, with feeding assistance and aspiration precautions.  For Heart failure patients - Check your Weight same time everyday, if you gain over 2 pounds, or you develop in leg swelling, experience more shortness of breath or chest pain, call your Primary MD immediately. Follow Cardiac Low Salt Diet and 1.5 lit/day fluid restriction.   On your next visit with your primary care physician please Get Medicines reviewed and adjusted.   Please request your Prim.MD to go over  all Hospital Tests and Procedure/Radiological results at the follow up, please get all Hospital records sent to your Prim MD by signing hospital release before you go home.   If you experience worsening of your admission symptoms, develop shortness of breath, life threatening emergency, suicidal or homicidal thoughts you must seek medical attention immediately by calling 911 or calling your MD immediately  if symptoms less  severe.  You Must read complete instructions/literature along with all the possible adverse reactions/side effects for all the Medicines you take and that have been prescribed to you. Take any new Medicines after you have completely understood and accpet all the possible adverse reactions/side effects.   Do not drive, operating heavy machinery, perform activities at heights, swimming or participation in water activities or provide baby sitting services if your were admitted for syncope or siezures until you have seen by Primary MD or a Neurologist and advised to do so again.  Do not drive when taking Pain medications.    Do not take more than prescribed Pain, Sleep and Anxiety Medications  Special Instructions: If you have smoked or chewed Tobacco  in the last 2 yrs please stop smoking, stop any regular Alcohol  and or any Recreational drug use.  Wear Seat belts while driving.   Please note  You were cared for by a hospitalist during your hospital stay. If you have any questions about your discharge medications or the care you received while you were in the hospital after you are discharged, you can call the unit and asked to speak with the hospitalist on call if the hospitalist that took care of you is not available. Once you are discharged, your primary care physician will handle any further medical issues. Please note that NO REFILLS for any discharge medications will be authorized once you are discharged, as it is imperative that you return to your primary care physician (or establish a relationship with a primary care physician if you do not have one) for your aftercare needs so that they can reassess your need for medications and monitor your lab values.   Increase activity slowly   Complete by: As directed       Allergies as of 06/03/2021   No Known Allergies      Medication List     STOP taking these medications    amLODipine 5 MG tablet Commonly known as: NORVASC    benazepril 20 MG tablet Commonly known as: LOTENSIN   propranolol ER 60 MG 24 hr capsule Commonly known as: INDERAL LA Replaced by: propranolol 10 MG tablet       TAKE these medications    feeding supplement Liqd Take 237 mLs by mouth 2 (two) times daily between meals.   furosemide 20 MG tablet Commonly known as: LASIX Take 1 tablet (20 mg total) by mouth daily. Start taking on: June 04, 2021 What changed:  medication strength how much to take   lactulose 10 GM/15ML solution Commonly known as: CHRONULAC Take 30 mLs (20 g total) by mouth 3 (three) times daily. Increase accordingly up to 45 mLs (30 gms total) and/or up to 3 x day if having worsening confusion What changed: when to take this   multivitamin with minerals Tabs tablet Take 1 tablet by mouth daily.   potassium chloride SA 20 MEQ tablet Commonly known as: KLOR-CON Take 1 tablet (20 mEq total) by mouth every 3 (three) days. What changed: when to take this   pravastatin 40 MG tablet Commonly known as:  PRAVACHOL Take 40 mg by mouth daily.   propranolol 10 MG tablet Commonly known as: INDERAL Take 1 tablet (10 mg total) by mouth 2 (two) times daily. Replaces: propranolol ER 60 MG 24 hr capsule        Follow-up Information     [name], MD Follow up.   Specialty: Family Medicine Contact information: [address] [place] [address] [phone]                No Known Allergies  Consultations: None   Procedures/Studies: CT ABDOMEN PELVIS WO CONTRAST  Result Date: 06/01/2021 CLINICAL DATA:  Acute abdominal pain. Suspect pancreatitis. Admitted with hepatic encephalopathy and hyperammonemia. EXAM: CT ABDOMEN AND PELVIS WITHOUT CONTRAST TECHNIQUE: Multidetector CT imaging of the abdomen and pelvis was performed following the standard protocol without IV contrast. COMPARISON:  Most recent CT 04/23/2021. FINDINGS: Lower chest: Small bilateral pleural effusions, right  greater than left. Basilar calcifications are similar. Hepatobiliary: Superior aspect of the liver is not included in the field of view. Cirrhotic hepatic morphology with nodular contours. Small cysts in the right and left lobe are similar to prior exam. No obvious new lesion. Cholecystectomy. Pancreas: Fat stranding about the pancreas is similar to the generalized degree of mesenteric edema throughout the abdomen and pelvis. There is no peripancreatic collection. No pancreatic ductal dilatation. Tiny sliver of gas between the pancreas and splenic vein, series 3, image 21, may be related to recent paracentesis. This does not appear to be within the pancreatic parenchyma or vasculature. Spleen: Again seen splenomegaly, greatest splenic dimension 14.8 cm. Adrenals/Urinary Tract: Normal adrenal glands. Hydronephrosis. No renal calculi. No evidence of focal renal abnormality. Partially distended urinary bladder. The dome of the urinary bladder extends towards the left internal inguinal ring. This is similar to prior. Stomach/Bowel: Small hiatal hernia. Decompressed stomach. Small duodenal diverticulum. Enteric contrast is seen to the distal small bowel. Single patulous loop of small bowel centrally, but no obstruction. There is minimal small bowel diverticulosis. Colonic assessment is limited due to lack of enteric contrast and presence of ascites. Left colonic diverticulosis. No evident diverticulitis. Appendix is not well seen on the current exam. Vascular/Lymphatic: Aortic atherosclerosis. No aortic aneurysm. There is no portal venous or mesenteric gas. Limited assessment for adenopathy given ascites. No bulky enlarged lymph nodes. Reproductive: Prostate is unremarkable. Other: Small to moderate abdominopelvic ascites. Patient underwent paracentesis yesterday, with small foci of gas in the anterior abdominal wall, upper abdomen, and intermittently throughout the mesentery. Ascites tracks into the right inguinal  canal. There is generalized mesenteric and body wall edema. Musculoskeletal: There are no acute or suspicious osseous abnormalities. Chronic and degenerative change in the pelvis and lumbar spine. Irregularity of the left iliac bone is likely due to remote injury. IMPRESSION: 1. No convincing findings of acute pancreatitis on the current exam. There is peripancreatic edema, however that is overall similar to the generalized mesenteric edema and stranding throughout the abdomen related to cirrhosis. No evidence of acute peripancreatic collection. 2. Small to moderate abdominopelvic ascites. Patient underwent paracentesis yesterday, with small foci of gas in the anterior abdominal wall, upper abdomen, and intermittently throughout the mesentery. Small focus of gas posterior to the pancreas is also likely related to paracentesis, this does not appear to be within the pancreatic parenchyma to suggest necrosis or adjacent vasculature. 3. Cirrhosis and splenomegaly. 4. Small bilateral pleural effusions, right greater than left. Generalized mesenteric and body wall edema. 5. Colonic diverticulosis without diverticulitis. Aortic Atherosclerosis (  ICD10-I70.0). Electronically Signed   By: Keith Rake M.D.   On: 06/01/2021 17:51   CT HEAD WO CONTRAST (5MM)  Result Date: 05/22/2021 CLINICAL DATA:  Cerebral hemorrhage suspected. EXAM: CT HEAD WITHOUT CONTRAST TECHNIQUE: Contiguous axial images were obtained from the base of the skull through the vertex without intravenous contrast. COMPARISON:  No pertinent prior exams available for comparison. FINDINGS: Brain: Mild generalized cerebral atrophy. Mild patchy and ill-defined hypoattenuation within the cerebral white matter, nonspecific but compatible with chronic small vessel ischemic disease. There is no acute intracranial hemorrhage. No demarcated cortical infarct. No extra-axial fluid collection. No evidence of an intracranial mass. No midline shift. Partially empty  sella turcica. Vascular: No hyperdense vessel.  Atherosclerotic calcifications Skull: Normal. Negative for fracture or focal lesion. Sinuses/Orbits: Visualized orbits show no acute finding. Mild-to-moderate mucosal thickening within the inferior maxillary sinuses bilaterally. 2.1 cm right maxillary sinus mucous retention cyst more superiorly. Trace mucosal thickening within the bilateral ethmoid air cells. IMPRESSION: No evidence of acute intracranial abnormality. Mild generalized cerebral atrophy and cerebral white matter chronic small vessel ischemic disease. Paranasal sinus disease at the imaged levels, as described. Electronically Signed   By: Kellie Simmering DO   On: 05/22/2021 15:17   MR ABDOMEN WWO CONTRAST  Result Date: 05/19/2021 CLINICAL DATA:  Cirrhosis, bloating, pain, nausea EXAM: MRI ABDOMEN WITHOUT AND WITH CONTRAST TECHNIQUE: Multiplanar multisequence MR imaging of the abdomen was performed both before and after the administration of intravenous contrast. CONTRAST:  21m MULTIHANCE GADOBENATE DIMEGLUMINE 529 MG/ML IV SOLN COMPARISON:  CT abdomen pelvis, 04/23/2021 FINDINGS: Examination is limited by pervasive breath motion artifact throughout, which degrades essentially every sequence submitted for review Lower chest: No acute findings. Hepatobiliary: Coarse, nodular, cirrhotic morphology of the liver. Multiple fluid signal, nonenhancing liver cysts. No obvious mass, abnormal contrast enhancement, or or other parenchymal abnormality identified. Status post cholecystectomy. Postoperative biliary ductal dilatation. Pancreas: No mass, inflammatory changes, or other parenchymal abnormality identified. No obvious pancreatic ductal dilatation, poorly assessed. Spleen:  Mild splenomegaly, maximum span 13.9 cm. Adrenals/Urinary Tract: No masses identified. No evidence of hydronephrosis. Stomach/Bowel: Incidental diverticula of the descending duodenum. Visualized portions within the abdomen are otherwise  unremarkable. Vascular/Lymphatic: Prominent celiac axis, portacaval, and gastrohepatic ligament lymph nodes. No abdominal aortic aneurysm demonstrated. Other:  Moderate volume ascites throughout the abdomen and pelvis. Musculoskeletal: No suspicious bone lesions identified. IMPRESSION: 1. Examination is limited by pervasive breath motion artifact throughout, which degrades essentially every sequence submitted for review. 2. Coarse, nodular, cirrhotic morphology of the liver. No obvious mass, abnormal contrast enhancement, or other parenchymal abnormality identified within the above limitation of motion artifact. 3. Mild splenomegaly, maximum span 13.9 cm. 4. Moderate volume ascites throughout the abdomen and pelvis. 5. Status post cholecystectomy. Postoperative biliary ductal dilatation. Electronically Signed   By: AEddie CandleM.D.   On: 05/19/2021 11:25   [place]domen Limited  Result Date: 05/30/2021 CLINICAL DATA:  Abdominal distension EXAM: LIMITED ABDOMEN ULTRASOUND FOR ASCITES TECHNIQUE: Limited ultrasound survey for ascites was performed in all four abdominal quadrants. COMPARISON:  None. FINDINGS: Scanning throughout the abdomen shows ascites in all 4 abdominal quadrants. Largest pocket is noted in the right lower quadrant. IMPRESSION: Scattered mild to moderate ascites. Electronically Signed   By: MInez CatalinaM.D.   On: 05/30/2021 17:30   [place]acentesis  Result Date: 05/31/2021 INDICATION: Patient with history of alcoholic cirrhosis and recurrent ascites presents today for a therapeutic paracentesis up to 1 L. EXAM: ULTRASOUND GUIDED PARACENTESIS MEDICATIONS: 1%  lidocaine 10 mL COMPLICATIONS: None immediate. PROCEDURE: Informed written consent was obtained from the patient after a discussion of the risks, benefits and alternatives to treatment. A timeout was performed prior to the initiation of the procedure. Initial ultrasound scanning demonstrates a large amount of ascites within the right lower  abdominal quadrant. The right lower abdomen was prepped and draped in the usual sterile fashion. 1% lidocaine was used for local anesthesia. Following this, a 19 gauge, 7-cm, Yueh catheter was introduced. An ultrasound image was saved for documentation purposes. The paracentesis was performed. The catheter was removed and a dressing was applied. The patient tolerated the procedure well without immediate post procedural complication. FINDINGS: A total of approximately 1 L of clear yellow fluid was removed. IMPRESSION: Successful ultrasound-guided paracentesis yielding 1 liters of peritoneal fluid. Read by: Soyla Dryer, NP Electronically Signed   By: Corrie Mckusick D.O.   On: 05/31/2021 14:24   US Paracentesis  Result Date: 05/22/2021 INDICATION: Abdominal distention and discomfort. Ascites. Request for diagnostic and therapeutic paracentesis up to 2 L max EXAM: ULTRASOUND GUIDED RIGHT LOWER QUADRANT PARACENTESIS MEDICATIONS: 1% plain lidocaine, 5 mL COMPLICATIONS: None immediate. PROCEDURE: Informed written consent was obtained from the patient after a discussion of the risks, benefits and alternatives to treatment. A timeout was performed prior to the initiation of the procedure. Initial ultrasound scanning demonstrates a large amount of ascites within the right lower abdominal quadrant. The right lower abdomen was prepped and draped in the usual sterile fashion. 1% lidocaine was used for local anesthesia. Following this, a 19 gauge, 7-cm, Yueh catheter was introduced. An ultrasound image was saved for documentation purposes. The paracentesis was performed. The catheter was removed and a dressing was applied. The patient tolerated the procedure well without immediate post procedural complication. FINDINGS: A total of approximately 2 L of clear yellow fluid was removed. Samples were sent to the laboratory as requested by the clinical team. IMPRESSION: Successful ultrasound-guided paracentesis yielding 2 liters  of peritoneal fluid. Read by: Ascencion Dike PA-C Electronically Signed   By: Jerilynn Mages.  Shick M.D.   On: 05/22/2021 16:20   DG Chest Portable 1 View  Result Date: 05/27/2021 CLINICAL DATA:  Near syncope EXAM: PORTABLE CHEST 1 VIEW COMPARISON:  11/28/2007 FINDINGS: The heart size and mediastinal contours are within normal limits. Both lungs are clear. The visualized skeletal structures are unremarkable. IMPRESSION: No active disease. Electronically Signed   By: Donavan Foil M.D.   On: 05/27/2021 16:11     Subjective:  Reports few loose bowel movements overnight, he denies any chest pain or shortness of breath, no dizziness or lightheadedness.  Discharge Exam: Vitals:   06/03/21 0953 06/03/21 0957  BP: 119/60 119/60  Pulse: (!) 58 (!) 57  Resp: 16   Temp: 98.4 F (36.9 C)   SpO2: 99% 98%   Vitals:   06/02/21 2008 06/03/21 0608 06/03/21 0953 06/03/21 0957  BP: (!) 137/59 (!) 124/57 119/60 119/60  Pulse: 69 62 (!) 58 (!) 57  Resp: 16 16 16    Temp: 98.5 F (36.9 C) 98.4 F (36.9 C) 98.4 F (36.9 C)   TempSrc: Oral Oral Oral   SpO2: 99% 95% 99% 98%  Weight:      Height:        General: Pt is alert, awake, not in acute distress Cardiovascular: RRR, S1/S2 +, no rubs, no gallops Respiratory: CTA bilaterally, no wheezing, no rhonchi Abdominal: Soft, NT, ND, bowel sounds +, no ascites could be appreciated Extremities: no edema,  no cyanosis    The results of significant diagnostics from this hospitalization (including imaging, microbiology, ancillary and laboratory) are listed below for reference.     Microbiology: Recent Results (from the past 240 hour(s))  SARS CORONAVIRUS 2 (TAT 6-24 HRS) Nasopharyngeal Nasopharyngeal Swab     Status: None   Collection Time: 05/28/21  6:14 PM   Specimen: Nasopharyngeal Swab  Result Value Ref Range Status   SARS Coronavirus 2 NEGATIVE NEGATIVE Final    Comment: (NOTE) SARS-CoV-2 target nucleic acids are NOT DETECTED.  The SARS-CoV-2 RNA is  generally detectable in upper and lower respiratory specimens during the acute phase of infection. Negative results do not preclude SARS-CoV-2 infection, do not rule out co-infections with other pathogens, and should not be used as the sole basis for treatment or other patient management decisions. Negative results must be combined with clinical observations, patient history, and epidemiological information. The expected result is Negative.  Fact Sheet for Patients: SugarRoll.be  Fact Sheet for Healthcare Providers: https://www.woods-mathews.com/  This test is not yet approved or cleared by the [place] FDA and  has been authorized for detection and/or diagnosis of SARS-CoV-2 by FDA under an Emergency Use Authorization (EUA). This EUA will remain  in effect (meaning this test can be used) for the duration of the COVID-19 declaration under Se ction 564(b)(1) of the Act, 21 U.S.C. section 360bbb-3(b)(1), unless the authorization is terminated or revoked sooner.  Performed at [facility], [place] [address] [place], [place] [address]      Labs: BNP (last 3 results) No results for input(s): BNP in the last 8760 hours. Basic Metabolic Panel: Recent Labs  Lab 05/30/21 0909 05/31/21 0344 06/01/21 0711 06/02/21 0155 06/03/21 0250  NA 132* 133* 135 133* 134*  K 4.6 5.3* 4.9 5.2* 4.3  CL 103 103 103 104 103  CO2 25 24 25 24 26   GLUCOSE 139* 95 128* 97 105*  BUN 24* 21 19 18 16   CREATININE 1.28* 1.08 1.15 1.12 1.13  CALCIUM 9.1 9.5 9.2 8.7* 8.6*  MG  --   --   --  1.7  --    Liver Function Tests: Recent Labs  Lab 05/27/21 1643 05/28/21 0401 06/01/21 0711  AST 40 36 60*  ALT 36 30 39  ALKPHOS 58 47 60  BILITOT 3.4* 2.4* 3.8*  PROT 5.3* 4.4* 5.6*  ALBUMIN 2.5* 2.1* 2.6*   Recent Labs  Lab 06/01/21 0711 06/02/21 0155  LIPASE 71* 51   Recent Labs  Lab 05/27/21 2216 05/29/21 0305 05/30/21 0909 06/02/21 0155   AMMONIA 57* 71* 73* 29   CBC: Recent Labs  Lab 05/27/21 1634 05/28/21 0401 05/31/21 0344 06/02/21 0155 06/03/21 0250  WBC 6.9 6.3 4.2 5.3 5.1  NEUTROABS 3.5  --   --   --   --   HGB 10.9* 9.4* 9.4* 9.4* 9.0*  HCT 31.0* 27.1* 26.7* 26.5* 25.5*  MCV 100.0 99.3 101.1* 99.6 100.4*  PLT 115* 87* 74* 64* 66*   Cardiac Enzymes: No results for input(s): CKTOTAL, CKMB, CKMBINDEX, TROPONINI in the last 168 hours. BNP: Invalid input(s): POCBNP CBG: Recent Labs  Lab 05/27/21 1628  GLUCAP 102*   D-Dimer No results for input(s): DDIMER in the last 72 hours. Hgb A1c No results for input(s): HGBA1C in the last 72 hours. Lipid Profile No results for input(s): CHOL, HDL, LDLCALC, TRIG, CHOLHDL, LDLDIRECT in the last 72 hours. Thyroid function studies No results for input(s): TSH, T4TOTAL, T3FREE, THYROIDAB in the  last 72 hours.  Invalid input(s): FREET3 Anemia work up No results for input(s): VITAMINB12, FOLATE, FERRITIN, TIBC, IRON, RETICCTPCT in the last 72 hours. Urinalysis    Component Value Date/Time   COLORURINE AMBER (A) 05/23/2021 1257   APPEARANCEUR CLEAR 05/23/2021 1257   LABSPEC 1.021 05/23/2021 1257   PHURINE 5.0 05/23/2021 1257   GLUCOSEU NEGATIVE 05/23/2021 1257   HGBUR NEGATIVE 05/23/2021 1257   [place] 05/23/2021 1257   KETONESUR NEGATIVE 05/23/2021 1257   PROTEINUR NEGATIVE 05/23/2021 1257   UROBILINOGEN 1.0 11/27/2007 1029   NITRITE NEGATIVE 05/23/2021 [place] 05/23/2021 1257   Sepsis Labs Invalid input(s): PROCALCITONIN,  WBC,  LACTICIDVEN Microbiology Recent Results (from the past 240 hour(s))  SARS CORONAVIRUS 2 (TAT 6-24 HRS) Nasopharyngeal Nasopharyngeal Swab     Status: None   Collection Time: 05/28/21  6:14 PM   Specimen: Nasopharyngeal Swab  Result Value Ref Range Status   SARS Coronavirus 2 NEGATIVE NEGATIVE Final    Comment: (NOTE) SARS-CoV-2 target nucleic acids are NOT DETECTED.  The SARS-CoV-2 RNA is  generally detectable in upper and lower respiratory specimens during the acute phase of infection. Negative results do not preclude SARS-CoV-2 infection, do not rule out co-infections with other pathogens, and should not be used as the sole basis for treatment or other patient management decisions. Negative results must be combined with clinical observations, patient history, and epidemiological information. The expected result is Negative.  Fact Sheet for Patients: SugarRoll.be  Fact Sheet for Healthcare Providers: https://www.woods-mathews.com/  This test is not yet approved or cleared by the [place] FDA and  has been authorized for detection and/or diagnosis of SARS-CoV-2 by FDA under an Emergency Use Authorization (EUA). This EUA will remain  in effect (meaning this test can be used) for the duration of the COVID-19 declaration under Se ction 564(b)(1) of the Act, 21 U.S.C. section 360bbb-3(b)(1), unless the authorization is terminated or revoked sooner.  Performed at [facility], [place] [address], [place], [place] [address]      Time coordinating discharge: Over 30 minutes  SIGNED:   Phillips Climes, MD  [facility] 06/03/2021, 12:05 PM Pager   If 7PM-7AM, please contact night-coverage [email or web address] Password [email or web address]

## 2021-06-06 DIAGNOSIS — K7031 Alcoholic cirrhosis of liver with ascites: Secondary | ICD-10-CM | POA: Diagnosis not present

## 2021-06-06 DIAGNOSIS — K769 Liver disease, unspecified: Secondary | ICD-10-CM | POA: Diagnosis not present

## 2021-06-06 DIAGNOSIS — F1020 Alcohol dependence, uncomplicated: Secondary | ICD-10-CM | POA: Diagnosis not present

## 2021-06-06 DIAGNOSIS — I10 Essential (primary) hypertension: Secondary | ICD-10-CM | POA: Diagnosis not present

## 2021-06-06 DIAGNOSIS — R1013 Epigastric pain: Secondary | ICD-10-CM | POA: Diagnosis not present

## 2021-06-12 ENCOUNTER — Other Ambulatory Visit: Payer: Self-pay | Admitting: Gastroenterology

## 2021-06-12 DIAGNOSIS — E785 Hyperlipidemia, unspecified: Secondary | ICD-10-CM | POA: Diagnosis not present

## 2021-06-12 DIAGNOSIS — I10 Essential (primary) hypertension: Secondary | ICD-10-CM | POA: Diagnosis not present

## 2021-06-13 ENCOUNTER — Other Ambulatory Visit: Payer: Self-pay | Admitting: Physician Assistant

## 2021-06-13 ENCOUNTER — Other Ambulatory Visit (HOSPITAL_COMMUNITY): Payer: Self-pay | Admitting: Physician Assistant

## 2021-06-13 DIAGNOSIS — K7031 Alcoholic cirrhosis of liver with ascites: Secondary | ICD-10-CM

## 2021-06-16 ENCOUNTER — Other Ambulatory Visit: Payer: Self-pay

## 2021-06-16 ENCOUNTER — Ambulatory Visit (HOSPITAL_COMMUNITY)
Admission: RE | Admit: 2021-06-16 | Discharge: 2021-06-16 | Disposition: A | Discharge status: Home / Self Care | Payer: Medicare HMO | Source: Ambulatory Visit | Attending: Physician Assistant | Admitting: Physician Assistant

## 2021-06-16 DIAGNOSIS — R188 Other ascites: Secondary | ICD-10-CM | POA: Diagnosis not present

## 2021-06-16 DIAGNOSIS — R899 Unspecified abnormal finding in specimens from other organs, systems and tissues: Secondary | ICD-10-CM | POA: Diagnosis not present

## 2021-06-16 DIAGNOSIS — K7031 Alcoholic cirrhosis of liver with ascites: Secondary | ICD-10-CM

## 2021-06-16 HISTORY — PX: IR PARACENTESIS: IMG2679

## 2021-06-16 MED ORDER — LIDOCAINE HCL 1 % IJ SOLN
INTRAMUSCULAR | Status: DC | PRN
  Administered 2021-06-16: 10 mL

## 2021-06-16 MED ORDER — LIDOCAINE HCL 1 % IJ SOLN
INTRAMUSCULAR | Status: AC
  Filled 2021-06-16: qty 20

## 2021-06-16 NOTE — Procedures (Signed)
PROCEDURE SUMMARY:  Successful US guided paracentesis from right abdomen.  Yielded 4.4 L of clear yellow fluid.  No immediate complications.  Pt tolerated well.   EBL < 2 mL  Theresa Duty, NP 06/16/2021 3:17 PM

## 2021-06-17 DIAGNOSIS — K7031 Alcoholic cirrhosis of liver with ascites: Secondary | ICD-10-CM | POA: Diagnosis not present

## 2021-06-24 DIAGNOSIS — R634 Abnormal weight loss: Secondary | ICD-10-CM | POA: Diagnosis not present

## 2021-06-25 ENCOUNTER — Other Ambulatory Visit: Payer: Self-pay | Admitting: Gastroenterology

## 2021-06-26 ENCOUNTER — Other Ambulatory Visit (HOSPITAL_COMMUNITY): Payer: Self-pay | Admitting: Gastroenterology

## 2021-06-26 ENCOUNTER — Other Ambulatory Visit: Payer: Self-pay | Admitting: Gastroenterology

## 2021-06-26 ENCOUNTER — Other Ambulatory Visit: Payer: Self-pay | Admitting: Family Medicine

## 2021-06-26 DIAGNOSIS — K7031 Alcoholic cirrhosis of liver with ascites: Secondary | ICD-10-CM

## 2021-06-30 ENCOUNTER — Ambulatory Visit (HOSPITAL_COMMUNITY)
Admission: RE | Admit: 2021-06-30 | Discharge: 2021-06-30 | Disposition: A | Discharge status: Home / Self Care | Payer: Medicare HMO | Source: Ambulatory Visit | Attending: Gastroenterology | Admitting: Gastroenterology

## 2021-06-30 ENCOUNTER — Other Ambulatory Visit: Payer: Self-pay

## 2021-06-30 DIAGNOSIS — K7031 Alcoholic cirrhosis of liver with ascites: Secondary | ICD-10-CM | POA: Diagnosis not present

## 2021-06-30 DIAGNOSIS — R188 Other ascites: Secondary | ICD-10-CM | POA: Diagnosis not present

## 2021-06-30 MED ORDER — LIDOCAINE HCL 1 % IJ SOLN
INTRAMUSCULAR | Status: AC
  Filled 2021-06-30: qty 20

## 2021-06-30 NOTE — Procedures (Signed)
PROCEDURE SUMMARY:  Successful image-guided paracentesis from the right lower abdomen.  Yielded 5.1 liters of clear yellow fluid.  No immediate complications. EBL < 1 mL Patient tolerated well.   Specimen was not sent for labs.  Please see imaging section of Epic for full dictation.  Joaquim Nam PA-C 06/30/2021 11:06 AM

## 2021-07-07 DIAGNOSIS — D696 Thrombocytopenia, unspecified: Secondary | ICD-10-CM | POA: Diagnosis not present

## 2021-07-07 DIAGNOSIS — I10 Essential (primary) hypertension: Secondary | ICD-10-CM | POA: Diagnosis not present

## 2021-07-07 DIAGNOSIS — K7031 Alcoholic cirrhosis of liver with ascites: Secondary | ICD-10-CM | POA: Diagnosis not present

## 2021-07-07 DIAGNOSIS — D649 Anemia, unspecified: Secondary | ICD-10-CM | POA: Diagnosis not present

## 2021-07-08 ENCOUNTER — Other Ambulatory Visit: Payer: Self-pay | Admitting: Gastroenterology

## 2021-07-08 ENCOUNTER — Other Ambulatory Visit (HOSPITAL_COMMUNITY): Payer: Self-pay | Admitting: Gastroenterology

## 2021-07-08 DIAGNOSIS — K7031 Alcoholic cirrhosis of liver with ascites: Secondary | ICD-10-CM

## 2021-07-09 ENCOUNTER — Other Ambulatory Visit (HOSPITAL_COMMUNITY): Payer: Self-pay | Admitting: Gastroenterology

## 2021-07-09 DIAGNOSIS — K7031 Alcoholic cirrhosis of liver with ascites: Secondary | ICD-10-CM

## 2021-07-11 ENCOUNTER — Other Ambulatory Visit: Payer: Self-pay

## 2021-07-11 ENCOUNTER — Ambulatory Visit (HOSPITAL_COMMUNITY)
Admission: RE | Admit: 2021-07-11 | Discharge: 2021-07-11 | Disposition: A | Discharge status: Home / Self Care | Payer: Medicare HMO | Source: Ambulatory Visit | Attending: Gastroenterology | Admitting: Gastroenterology

## 2021-07-11 DIAGNOSIS — R188 Other ascites: Secondary | ICD-10-CM | POA: Diagnosis not present

## 2021-07-11 DIAGNOSIS — K7031 Alcoholic cirrhosis of liver with ascites: Secondary | ICD-10-CM | POA: Diagnosis not present

## 2021-07-11 HISTORY — PX: IR PARACENTESIS: IMG2679

## 2021-07-11 MED ORDER — LIDOCAINE HCL 1 % IJ SOLN
INTRAMUSCULAR | Status: AC
  Filled 2021-07-11: qty 20

## 2021-07-11 MED ORDER — LIDOCAINE HCL (PF) 1 % IJ SOLN
INTRAMUSCULAR | Status: DC | PRN
  Administered 2021-07-11: 10 mL

## 2021-07-11 MED ORDER — ALBUMIN HUMAN 25 % IV SOLN
INTRAVENOUS | Status: AC
  Filled 2021-07-11: qty 200

## 2021-07-11 MED ORDER — ALBUMIN HUMAN 25 % IV SOLN
50.0000 g | Freq: Once | INTRAVENOUS | Status: AC
  Administered 2021-07-11: 50 g via INTRAVENOUS

## 2021-07-11 NOTE — Procedures (Signed)
PROCEDURE SUMMARY:  Successful US guided paracentesis from left lower quadrant.  Yielded 5.5L of yellow fluid.  No immediate complications.  Pt tolerated well.   Specimen not sent for labs.  EBL negligible  Voshon Petro PA-C 07/11/2021 10:48 AM

## 2021-07-28 ENCOUNTER — Encounter (HOSPITAL_COMMUNITY): Payer: Self-pay | Admitting: Gastroenterology

## 2021-07-28 NOTE — Progress Notes (Signed)
Attempted to obtain medical history via telephone, unable to reach at this time. I left a voicemail to return pre surgical testing department's phone call.  

## 2021-07-30 DIAGNOSIS — K7031 Alcoholic cirrhosis of liver with ascites: Secondary | ICD-10-CM | POA: Diagnosis not present

## 2021-08-05 NOTE — Anesthesia Preprocedure Evaluation (Addendum)
Anesthesia Evaluation  Patient identified by MRN, date of birth, ID band Patient awake    Airway Mallampati: II  TM Distance: >3 FB     Dental   Pulmonary    breath sounds clear to auscultation       Cardiovascular hypertension,  Rhythm:Regular Rate:Normal     Neuro/Psych    GI/Hepatic (+) Hepatitis -History noted Dr. Nyoka Cowden   Endo/Other    Renal/GU Renal disease     Musculoskeletal   Abdominal   Peds  Hematology   Anesthesia Other Findings   Reproductive/Obstetrics                            Anesthesia Physical Anesthesia Plan  ASA: 3  Anesthesia Plan: MAC   Post-op Pain Management:    Induction: Intravenous  PONV Risk Score and Plan: Treatment may vary due to age or medical condition and Ondansetron  Airway Management Planned: Simple Face Mask  Additional Equipment:   Intra-op Plan:   Post-operative Plan:   Informed Consent: I have reviewed the patients History and Physical, chart, labs and discussed the procedure including the risks, benefits and alternatives for the proposed anesthesia with the patient or authorized representative who has indicated his/her understanding and acceptance.       Plan Discussed with: Anesthesiologist and CRNA  Anesthesia Plan Comments:        Anesthesia Quick Evaluation

## 2021-08-06 ENCOUNTER — Encounter (HOSPITAL_COMMUNITY): Payer: Self-pay | Admitting: Gastroenterology

## 2021-08-06 ENCOUNTER — Ambulatory Visit (HOSPITAL_COMMUNITY): Payer: Medicare HMO | Admitting: Anesthesiology

## 2021-08-06 ENCOUNTER — Ambulatory Visit (HOSPITAL_COMMUNITY)
Admission: RE | Admit: 2021-08-06 | Discharge: 2021-08-06 | Disposition: A | Discharge status: Home / Self Care | Payer: Medicare HMO | Attending: Gastroenterology | Admitting: Gastroenterology

## 2021-08-06 ENCOUNTER — Other Ambulatory Visit: Payer: Self-pay

## 2021-08-06 ENCOUNTER — Encounter (HOSPITAL_COMMUNITY)
Admission: RE | Disposition: A | Discharge status: Home / Self Care | Payer: Self-pay | Source: Home / Self Care | Attending: Gastroenterology

## 2021-08-06 DIAGNOSIS — Z13810 Encounter for screening for upper gastrointestinal disorder: Secondary | ICD-10-CM | POA: Diagnosis not present

## 2021-08-06 DIAGNOSIS — I10 Essential (primary) hypertension: Secondary | ICD-10-CM | POA: Diagnosis not present

## 2021-08-06 DIAGNOSIS — K766 Portal hypertension: Secondary | ICD-10-CM | POA: Diagnosis not present

## 2021-08-06 DIAGNOSIS — K7581 Nonalcoholic steatohepatitis (NASH): Secondary | ICD-10-CM | POA: Diagnosis not present

## 2021-08-06 DIAGNOSIS — K7031 Alcoholic cirrhosis of liver with ascites: Secondary | ICD-10-CM | POA: Diagnosis not present

## 2021-08-06 DIAGNOSIS — Z9049 Acquired absence of other specified parts of digestive tract: Secondary | ICD-10-CM | POA: Diagnosis not present

## 2021-08-06 DIAGNOSIS — Z79899 Other long term (current) drug therapy: Secondary | ICD-10-CM | POA: Diagnosis not present

## 2021-08-06 DIAGNOSIS — E785 Hyperlipidemia, unspecified: Secondary | ICD-10-CM | POA: Diagnosis not present

## 2021-08-06 DIAGNOSIS — K3189 Other diseases of stomach and duodenum: Secondary | ICD-10-CM | POA: Insufficient documentation

## 2021-08-06 DIAGNOSIS — K7460 Unspecified cirrhosis of liver: Secondary | ICD-10-CM | POA: Diagnosis not present

## 2021-08-06 HISTORY — PX: ESOPHAGOGASTRODUODENOSCOPY (EGD) WITH PROPOFOL: SHX5813

## 2021-08-06 LAB — POCT I-STAT, CHEM 8
BUN: 12 mg/dL (ref 8–23)
Calcium, Ion: 1.17 mmol/L (ref 1.15–1.40)
Chloride: 100 mmol/L (ref 98–111)
Creatinine, Ser: 0.7 mg/dL (ref 0.61–1.24)
Glucose, Bld: 114 mg/dL — ABNORMAL HIGH (ref 70–99)
HCT: 35 % — ABNORMAL LOW (ref 39.0–52.0)
Hemoglobin: 11.9 g/dL — ABNORMAL LOW (ref 13.0–17.0)
Potassium: 3.4 mmol/L — ABNORMAL LOW (ref 3.5–5.1)
Sodium: 138 mmol/L (ref 135–145)
TCO2: 27 mmol/L (ref 22–32)

## 2021-08-06 SURGERY — ESOPHAGOGASTRODUODENOSCOPY (EGD) WITH PROPOFOL
Anesthesia: Monitor Anesthesia Care | Laterality: Bilateral

## 2021-08-06 MED ORDER — SODIUM CHLORIDE 0.9 % IV SOLN: INTRAVENOUS | Status: DC

## 2021-08-06 MED ORDER — FENTANYL CITRATE (PF) 100 MCG/2ML IJ SOLN: 25.0000 ug | INTRAMUSCULAR | Status: DC | PRN

## 2021-08-06 MED ORDER — LIDOCAINE 2% (20 MG/ML) 5 ML SYRINGE
INTRAMUSCULAR | Status: DC | PRN
  Administered 2021-08-06: 50 mg via INTRAVENOUS

## 2021-08-06 MED ORDER — PROPOFOL 500 MG/50ML IV EMUL
INTRAVENOUS | Status: DC | PRN
  Administered 2021-08-06: 150 ug/kg/min via INTRAVENOUS

## 2021-08-06 SURGICAL SUPPLY — 15 items

## 2021-08-06 NOTE — Discharge Instructions (Signed)
YOU HAD AN ENDOSCOPIC PROCEDURE TODAY: Refer to the procedure report and other information in the discharge instructions given to you for any specific questions about what was found during the examination. If this information does not answer your questions, please call [facility] office at [phone] to clarify.   YOU SHOULD EXPECT: Some feelings of bloating in the abdomen. Passage of more gas than usual. Walking can help get rid of the air that was put into your GI tract during the procedure and reduce the bloating. If you had a lower endoscopy (such as a colonoscopy or flexible sigmoidoscopy) you may notice spotting of blood in your stool or on the toilet paper. Some abdominal soreness may be present for a day or two, also.  DIET: Your first meal following the procedure should be a light meal and then it is ok to progress to your normal diet. A half-sandwich or bowl of soup is an example of a good first meal. Heavy or fried foods are harder to digest and may make you feel nauseous or bloated. Drink plenty of fluids but you should avoid alcoholic beverages for 24 hours. If you had a esophageal dilation, please see attached instructions for diet.    ACTIVITY: Your care partner should take you home directly after the procedure. You should plan to take it easy, moving slowly for the rest of the day. You can resume normal activity the day after the procedure however YOU SHOULD NOT DRIVE, use power tools, machinery or perform tasks that involve climbing or major physical exertion for 24 hours (because of the sedation medicines used during the test).   SYMPTOMS TO REPORT IMMEDIATELY: A gastroenterologist can be reached at any hour. Please call [phone]  for any of the following symptoms:  Following upper endoscopy (EGD, EUS, ERCP, esophageal dilation) Vomiting of blood or coffee ground material  New, significant abdominal pain  New, significant chest pain or pain under the shoulder blades  Painful or  persistently difficult swallowing  New shortness of breath  Black, tarry-looking or red, bloody stools  FOLLOW UP:  If any biopsies were taken you will be contacted by phone or by letter within the next 1-3 weeks. Call [phone]  if you have not heard about the biopsies in 3 weeks.  Please also call with any specific questions about appointments or follow up tests.

## 2021-08-06 NOTE — Op Note (Signed)
[facility] Patient Name: Peter Nichols Procedure Date: 08/06/2021 MRN: [id number] Attending MD: Arta Silence , MD Date of Birth: 12-12-1945 CSN: [id number] Age: 75 Admit Type: Outpatient Procedure:                Upper GI endoscopy Indications:              Screening procedure, cirrhosis Providers:                Arta Silence, MD, Doristine Johns, RN, Jaci Carrel, RN, Janie Billups, Technician, Luan Moore, Technician Referring MD:              Medicines:                Monitored Anesthesia Care Complications:            No immediate complications. Estimated Blood Loss:     Estimated blood loss: none. Procedure:                Pre-Anesthesia Assessment:                           - Prior to the procedure, a History and Physical                            was performed, and patient medications and                            allergies were reviewed. The patient's tolerance of                            previous anesthesia was also reviewed. The risks                            and benefits of the procedure and the sedation                            options and risks were discussed with the patient.                            All questions were answered, and informed consent                            was obtained. Prior Anticoagulants: The patient has                            taken no previous anticoagulant or antiplatelet                            agents. ASA Grade Assessment: III - A patient with                            severe  systemic disease. After reviewing the risks                            and benefits, the patient was deemed in                            satisfactory condition to undergo the procedure.                           After obtaining informed consent, the endoscope was                            passed under direct vision. Throughout the                            procedure,  the patient's blood pressure, pulse, and                            oxygen saturations were monitored continuously. The                            [id number]-H190 ([id number] Olympus endoscope was introduced                            through the mouth, and advanced to the second part                            of duodenum. The upper GI endoscopy was                            accomplished without difficulty. The patient                            tolerated the procedure well. Scope In: Scope Out: Findings:      The examined esophagus was normal. No esophageal varices.      Moderate portal hypertensive gastropathy was found in the entire       examined stomach.      The exam of the stomach was otherwise normal. No gastric varices seen.      The duodenal bulb, first portion of the duodenum and second portion of       the duodenum were normal. Impression:               - Normal esophagus.                           - Portal hypertensive gastropathy.                           - Normal duodenal bulb, first portion of the                            duodenum and second portion of the duodenum.                           -  No esophageal or gastric varices were identified. Moderate Sedation:      Not Applicable - Patient had care per Anesthesia. Recommendation:           - Patient has a contact number available for                            emergencies. The signs and symptoms of potential                            delayed complications were discussed with the                            patient. Return to normal activities tomorrow.                            Written discharge instructions were provided to the                            patient.                           - Discharge patient to home (via wheelchair).                           - Resume previous diet today.                           - Continue present medications.                           - Return to GI clinic as previously scheduled.                            - Return to referring physician as previously                            scheduled. Procedure Code(s):        --- Professional ---                           [id number], Esophagogastroduodenoscopy, flexible,                            transoral; diagnostic, including collection of                            specimen(s) by brushing or washing, when performed                            (separate procedure) Diagnosis Code(s):        --- Professional ---                           K76.6, Portal hypertension                           K31.89, Other diseases of  stomach and duodenum                           Z13.810, Encounter for screening for upper                            gastrointestinal disorder CPT copyright 2019 [employer]. All rights reserved. The codes documented in this report are preliminary and upon coder review may  be revised to meet current compliance requirements. Arta Silence, MD 08/06/2021 9:07:35 AM This report has been signed electronically. Number of Addenda: 0

## 2021-08-06 NOTE — H&P (Signed)
[facility] Admission Note  Chief Complaint: cirrhosis HPI: Peter Nichols is an 75 y.o. male.  Needs cirrhosis for variceal screening.  Asymptomatic.  Past Medical History:  Diagnosis Date   Dysphagia    Hyperlipidemia    Hypertension    Neuromuscular disorder ([place])    tremor   Steatohepatitis, non-alcoholic    Thrombocytopenia ([place])     Past Surgical History:  Procedure Laterality Date   CHOLECYSTECTOMY     IR PARACENTESIS  06/16/2021   IR PARACENTESIS  07/11/2021    Medications Prior to Admission  Medication Sig Dispense Refill   b complex vitamins capsule Take 1 capsule by mouth daily.     feeding supplement (ENSURE ENLIVE / ENSURE PLUS) LIQD Take 237 mLs by mouth 2 (two) times daily between meals. (Patient taking differently: Take 237 mLs by mouth daily.) 237 mL 12   furosemide (LASIX) 20 MG tablet Take 1 tablet (20 mg total) by mouth daily. 30 tablet 0   furosemide (LASIX) 40 MG tablet Take 40 mg by mouth daily.     lactulose (CHRONULAC) 10 GM/15ML solution Take 30 mLs (20 g total) by mouth 3 (three) times daily. Increase accordingly up to 45 mLs (30 gms total) and/or up to 3 x day if having worsening confusion (Patient taking differently: Take 20 g by mouth 3 (three) times daily.) 946 mL 0   Multiple Vitamin (MULTIVITAMIN WITH MINERALS) TABS tablet Take 1 tablet by mouth daily. 30 tablet 1   pantoprazole (PROTONIX) 40 MG tablet Take 40 mg by mouth daily.     potassium chloride SA (KLOR-CON) 20 MEQ tablet Take 1 tablet (20 mEq total) by mouth every 3 (three) days.     pravastatin (PRAVACHOL) 40 MG tablet Take 40 mg by mouth daily.      propranolol (INDERAL) 10 MG tablet Take 1 tablet (10 mg total) by mouth 2 (two) times daily. 60 tablet 0   spironolactone (ALDACTONE) 100 MG tablet Take 100 mg by mouth daily.      Allergies: No Known Allergies  Family History  Problem Relation Age of Onset   Cancer Mother     Social History:  reports that he has never  smoked. He has never used smokeless tobacco. He reports that he does not currently use alcohol. He reports that he does not use drugs.   ROS: As per HPI, all others negative.   Blood pressure (!) 129/55, pulse (!) 58, temperature 98.3 F (36.8 C), temperature source Oral, resp. rate 16, height 5' 10"  (1.778 m), weight 80.3 kg, SpO2 98 %. General appearance: NAD HEENT:  NCAT, anicteric ABD:  Soft, non-tender NEURO:  No encephalopathy No results found for this or any previous visit (from the past 48 hour(s)). No results found.  Assessment/Plan   Cirrhosis.  Needs variceal screening. Endoscopy. Risks (bleeding, infection, bowel perforation that could require surgery, sedation-related changes in cardiopulmonary systems), benefits (identification and possible treatment of source of symptoms, exclusion of certain causes of symptoms), and alternatives (watchful waiting, radiographic imaging studies, empiric medical treatment) of upper endoscopy (EGD) were explained to patient/family in detail and patient wishes to proceed.   Peter Nichols, Peter Nichols 08/06/2021, 8:39 AM

## 2021-08-06 NOTE — Anesthesia Postprocedure Evaluation (Signed)
Anesthesia Post Note  Patient: Peter Nichols  Procedure(s) Performed: ESOPHAGOGASTRODUODENOSCOPY (EGD) WITH PROPOFOL (Bilateral)     Patient location during evaluation: PACU Anesthesia Type: MAC Level of consciousness: awake Pain management: pain level controlled Vital Signs Assessment: post-procedure vital signs reviewed and stable Respiratory status: spontaneous breathing Cardiovascular status: stable Postop Assessment: no apparent nausea or vomiting Anesthetic complications: no   No notable events documented.  Last Vitals:  Vitals:   08/06/21 0910 08/06/21 0920  BP: 132/60 (!) 128/55  Pulse: (!) 59 (!) 57  Resp: 19 17  Temp:    SpO2: 99% 98%    Last Pain:  Vitals:   08/06/21 0920  TempSrc:   PainSc: 0-No pain                 Braleigh Massoud

## 2021-08-06 NOTE — Transfer of Care (Signed)
Immediate Anesthesia Transfer of Care Note  Patient: Peter Nichols  Procedure(s) Performed: ESOPHAGOGASTRODUODENOSCOPY (EGD) WITH PROPOFOL (Bilateral)  Patient Location: Endoscopy Unit  Anesthesia Type:MAC  Level of Consciousness: drowsy  Airway & Oxygen Therapy: Patient Spontanous Breathing and Patient connected to nasal cannula oxygen  Post-op Assessment: Report given to RN and Post -op Vital signs reviewed and stable  Post vital signs: Reviewed and stable  Last Vitals:  Vitals Value Taken Time  BP    Temp    Pulse    Resp    SpO2      Last Pain:  Vitals:   08/06/21 0726  TempSrc: Oral  PainSc: 0-No pain         Complications: No notable events documented.

## 2021-08-08 ENCOUNTER — Encounter (HOSPITAL_COMMUNITY): Payer: Self-pay | Admitting: Gastroenterology

## 2021-10-06 ENCOUNTER — Other Ambulatory Visit (HOSPITAL_COMMUNITY): Payer: Self-pay | Admitting: Gastroenterology

## 2021-10-06 DIAGNOSIS — K7031 Alcoholic cirrhosis of liver with ascites: Secondary | ICD-10-CM

## 2021-10-10 ENCOUNTER — Other Ambulatory Visit: Payer: Self-pay

## 2021-10-10 ENCOUNTER — Ambulatory Visit (HOSPITAL_COMMUNITY)
Admission: RE | Admit: 2021-10-10 | Discharge: 2021-10-10 | Disposition: A | Discharge status: Home / Self Care | Payer: Medicare HMO | Source: Ambulatory Visit | Attending: Gastroenterology | Admitting: Gastroenterology

## 2021-10-10 ENCOUNTER — Other Ambulatory Visit (HOSPITAL_COMMUNITY): Payer: Self-pay | Admitting: Gastroenterology

## 2021-10-10 DIAGNOSIS — K7031 Alcoholic cirrhosis of liver with ascites: Secondary | ICD-10-CM | POA: Diagnosis not present

## 2021-10-10 DIAGNOSIS — R188 Other ascites: Secondary | ICD-10-CM | POA: Diagnosis not present

## 2021-10-10 MED ORDER — LIDOCAINE HCL 1 % IJ SOLN
INTRAMUSCULAR | Status: AC
  Filled 2021-10-10: qty 20

## 2021-10-10 NOTE — Progress Notes (Signed)
Pt was brought to [place] for paracentesis. Upon US examination, there was no fluid on R and a very small fluid pocket on L above liver that was not large enough to safely access.  Exam was ended and explained to patient.  Patient was in agreement with findings.  Narda Rutherford, AGNP-BC 10/10/2021, 2:55 PM

## 2021-10-16 DIAGNOSIS — K219 Gastro-esophageal reflux disease without esophagitis: Secondary | ICD-10-CM | POA: Diagnosis not present

## 2021-10-16 DIAGNOSIS — I10 Essential (primary) hypertension: Secondary | ICD-10-CM | POA: Diagnosis not present

## 2021-10-16 DIAGNOSIS — E785 Hyperlipidemia, unspecified: Secondary | ICD-10-CM | POA: Diagnosis not present

## 2021-10-22 DIAGNOSIS — F1021 Alcohol dependence, in remission: Secondary | ICD-10-CM | POA: Diagnosis not present

## 2021-10-22 DIAGNOSIS — I10 Essential (primary) hypertension: Secondary | ICD-10-CM | POA: Diagnosis not present

## 2021-10-22 DIAGNOSIS — D692 Other nonthrombocytopenic purpura: Secondary | ICD-10-CM | POA: Diagnosis not present

## 2021-10-22 DIAGNOSIS — L570 Actinic keratosis: Secondary | ICD-10-CM | POA: Diagnosis not present

## 2021-10-22 DIAGNOSIS — E785 Hyperlipidemia, unspecified: Secondary | ICD-10-CM | POA: Diagnosis not present

## 2021-10-22 DIAGNOSIS — Z0000 Encounter for general adult medical examination without abnormal findings: Secondary | ICD-10-CM | POA: Diagnosis not present

## 2021-10-22 DIAGNOSIS — K219 Gastro-esophageal reflux disease without esophagitis: Secondary | ICD-10-CM | POA: Diagnosis not present

## 2021-10-22 DIAGNOSIS — K7031 Alcoholic cirrhosis of liver with ascites: Secondary | ICD-10-CM | POA: Diagnosis not present

## 2021-10-22 DIAGNOSIS — N62 Hypertrophy of breast: Secondary | ICD-10-CM | POA: Diagnosis not present

## 2021-11-04 DIAGNOSIS — K7031 Alcoholic cirrhosis of liver with ascites: Secondary | ICD-10-CM | POA: Diagnosis not present

## 2021-11-04 DIAGNOSIS — N62 Hypertrophy of breast: Secondary | ICD-10-CM | POA: Diagnosis not present

## 2021-11-04 DIAGNOSIS — K219 Gastro-esophageal reflux disease without esophagitis: Secondary | ICD-10-CM | POA: Diagnosis not present

## 2021-11-07 ENCOUNTER — Other Ambulatory Visit: Payer: Self-pay

## 2021-11-07 ENCOUNTER — Emergency Department (HOSPITAL_COMMUNITY)
Admission: EM | Admit: 2021-11-07 | Discharge: 2021-11-07 | Disposition: A | Discharge status: Home / Self Care | Payer: Medicare HMO | Attending: Emergency Medicine | Admitting: Emergency Medicine

## 2021-11-07 ENCOUNTER — Encounter (HOSPITAL_COMMUNITY): Payer: Self-pay

## 2021-11-07 DIAGNOSIS — R4182 Altered mental status, unspecified: Secondary | ICD-10-CM | POA: Insufficient documentation

## 2021-11-07 DIAGNOSIS — E7220 Disorder of urea cycle metabolism, unspecified: Secondary | ICD-10-CM | POA: Diagnosis not present

## 2021-11-07 DIAGNOSIS — Z79899 Other long term (current) drug therapy: Secondary | ICD-10-CM | POA: Diagnosis not present

## 2021-11-07 DIAGNOSIS — K7030 Alcoholic cirrhosis of liver without ascites: Secondary | ICD-10-CM

## 2021-11-07 DIAGNOSIS — E7229 Other disorders of urea cycle metabolism: Secondary | ICD-10-CM | POA: Insufficient documentation

## 2021-11-07 LAB — COMPREHENSIVE METABOLIC PANEL
ALT: 34 U/L (ref 0–44)
AST: 47 U/L — ABNORMAL HIGH (ref 15–41)
Albumin: 3 g/dL — ABNORMAL LOW (ref 3.5–5.0)
Alkaline Phosphatase: 65 U/L (ref 38–126)
Anion gap: 7 (ref 5–15)
BUN: 14 mg/dL (ref 8–23)
CO2: 23 mmol/L (ref 22–32)
Calcium: 8.8 mg/dL — ABNORMAL LOW (ref 8.9–10.3)
Chloride: 105 mmol/L (ref 98–111)
Creatinine, Ser: 0.8 mg/dL (ref 0.61–1.24)
GFR, Estimated: >60 mL/min (ref >60)
Glucose, Bld: 162 mg/dL — ABNORMAL HIGH (ref 70–99)
Potassium: 4.5 mmol/L (ref 3.5–5.1)
Sodium: 135 mmol/L (ref 135–145)
Total Bilirubin: 4 mg/dL — ABNORMAL HIGH (ref 0.3–1.2)
Total Protein: 6.2 g/dL — ABNORMAL LOW (ref 6.5–8.1)

## 2021-11-07 LAB — CBG MONITORING, ED: Glucose-Capillary: 160 mg/dL — ABNORMAL HIGH (ref 70–99)

## 2021-11-07 LAB — AMMONIA: Ammonia: 82 umol/L — ABNORMAL HIGH (ref 9–35)

## 2021-11-07 LAB — CBC
HCT: 39 % (ref 39.0–52.0)
Hemoglobin: 13.6 g/dL (ref 13.0–17.0)
MCH: 34.3 pg — ABNORMAL HIGH (ref 26.0–34.0)
MCHC: 34.9 g/dL (ref 30.0–36.0)
MCV: 98.5 fL (ref 80.0–100.0)
Platelets: 81 10*3/uL — ABNORMAL LOW (ref 150–400)
RBC: 3.96 MIL/uL — ABNORMAL LOW (ref 4.22–5.81)
RDW: 14.3 % (ref 11.5–15.5)
WBC: 5.2 10*3/uL (ref 4.0–10.5)
nRBC: 0 % (ref 0.0–0.2)

## 2021-11-07 LAB — PROTIME-INR
INR: 1.6 — ABNORMAL HIGH (ref 0.8–1.2)
Prothrombin Time: 19.3 seconds — ABNORMAL HIGH (ref 11.4–15.2)

## 2021-11-07 MED ORDER — LACTULOSE 10 GM/15ML PO SOLN
30.0000 g | Freq: Once | ORAL | Status: AC
Start: 2021-11-07 — End: 2021-11-07
  Administered 2021-11-07: 30 g via ORAL
  Filled 2021-11-07: qty 60

## 2021-11-07 MED ORDER — LACTULOSE 10 GM/15ML PO SOLN: 20.0000 g | Freq: Once | ORAL | Status: DC

## 2021-11-07 NOTE — ED Triage Notes (Signed)
Patient reports a history of cirrhosis. Patient's wife reports that the patient woke up this AM with confusion and feeling lethargic. Wife reports that the patient would say he was turning the  light off,but would turn it on, etc. And telling his wife he did not know what he was doing and felt confused.

## 2021-11-07 NOTE — Discharge Instructions (Addendum)
It was our pleasure to provide your ER care today - we hope that you feel better.  Today, your ammonia level is high, likely causing the confusion.  Per Dr Elgergawy's dosing recommendations for your lactulose, given the recent symptoms and high level - increase your dose of lactulose to 45 ml (30 gms) 3x/day for the next three days, and then resume your baseline dose of 30 mls (20 gms) 3x/day.    Make sure to stay adequately hydrated/drink plenty of fluids.   Follow up with your doctor Monday or Tuesday for recheck.  Return to ER if worse, new symptoms, fevers, new/severe pain, trouble breathing, worsening confusion, or other concern.

## 2021-11-07 NOTE — ED Notes (Signed)
ED Provider at bedside. 

## 2021-11-07 NOTE — ED Provider Notes (Signed)
[facility] DEPT Provider Note   CSN: [id number] Arrival date & time: 11/07/21  1152     History  Chief Complaint  Patient presents with   Altered Mental Status    Peter Nichols is a 76 y.o. male.  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 Symptoms gradual onset, persistent, progressive, worse today. Indicates has been compliant w meds including lactulose, has bm q day. Denies abd pain or distension. No vomiting.  No dysuria or gu c/o. No fever or chills. No etoh use. Denies any recent trauma or fall. No headache. No change in speech or vision. No numbness or focal weakness.   The history is provided by the patient, the spouse and medical records.  Altered Mental Status Presenting symptoms: confusion   Associated symptoms: weakness   Associated symptoms: no abdominal pain, no fever, no headaches, no rash and no vomiting       Home Medications Prior to Admission medications   Medication Sig Start Date End Date Taking? Authorizing Provider  b complex vitamins capsule Take 1 capsule by mouth daily.    [provider]  feeding supplement (ENSURE ENLIVE / ENSURE PLUS) LIQD Take 237 mLs by mouth 2 (two) times daily between meals. Patient taking differently: Take 237 mLs by mouth daily. 05/24/21   Annita Brod, MD  furosemide (LASIX) 20 MG tablet Take 1 tablet (20 mg total) by mouth daily. 06/04/21   Elgergawy, Silver Huguenin, MD  lactulose (CHRONULAC) 10 GM/15ML solution Take 30 mLs (20 g total) by mouth 3 (three) times daily. Increase accordingly up to 45 mLs (30 gms total) and/or up to 3 x day if having worsening confusion Patient taking differently: Take 20 g by mouth 3 (three) times daily. 06/03/21   Elgergawy, Silver Huguenin, MD  Multiple Vitamin (MULTIVITAMIN WITH MINERALS) TABS tablet Take 1 tablet by mouth daily. 05/25/21   Annita Brod, MD  pantoprazole (PROTONIX)  40 MG tablet Take 40 mg by mouth daily.    [provider]  potassium chloride SA (KLOR-CON) 20 MEQ tablet Take 1 tablet (20 mEq total) by mouth every 3 (three) days. 06/03/21   Elgergawy, Silver Huguenin, MD  pravastatin (PRAVACHOL) 40 MG tablet Take 40 mg by mouth daily.  08/31/19   [provider]  propranolol (INDERAL) 10 MG tablet Take 1 tablet (10 mg total) by mouth 2 (two) times daily. 06/03/21   Elgergawy, Silver Huguenin, MD  spironolactone (ALDACTONE) 100 MG tablet Take 100 mg by mouth daily.    [provider]      Allergies    Other    Review of Systems   Review of Systems  Constitutional:  Negative for chills, diaphoresis and fever.  HENT:  Negative for sore throat.   Eyes:  Negative for redness and visual disturbance.  Respiratory:  Negative for cough and shortness of breath.   Cardiovascular:  Negative for chest pain.  Gastrointestinal:  Negative for abdominal pain, diarrhea and vomiting.  Genitourinary:  Negative for dysuria and flank pain.  Musculoskeletal:  Negative for back pain and neck pain.  Skin:  Negative for rash.  Neurological:  Positive for weakness. Negative for speech difficulty and headaches.  Hematological:  Does not bruise/bleed easily.  Psychiatric/Behavioral:  Positive for confusion.    Physical Exam Updated Vital Signs BP (!) 149/57 (BP Location: Right Arm)    Pulse (!) 54  Temp (!) 97.5 F (36.4 C) (Oral)    Resp 16    Ht 1.727 m (5' 8" )    Wt 75.3 kg    SpO2 98%    BMI 25.24 kg/m  Physical Exam Vitals and nursing note reviewed.  Constitutional:      Appearance: Normal appearance. He is well-developed.  HENT:     Head: Atraumatic.     Nose: Nose normal.     Mouth/Throat:     Mouth: Mucous membranes are moist.     Pharynx: Oropharynx is clear.  Eyes:     General: No scleral icterus.    Conjunctiva/sclera: Conjunctivae normal.     Pupils: Pupils are equal, round, and reactive to light.  Neck:     Vascular: No carotid bruit.      Trachea: No tracheal deviation.  Cardiovascular:     Rate and Rhythm: Normal rate and regular rhythm.     Pulses: Normal pulses.     Heart sounds: Normal heart sounds. No murmur heard.   No friction rub. No gallop.  Pulmonary:     Effort: Pulmonary effort is normal. No accessory muscle usage or respiratory distress.     Breath sounds: Normal breath sounds.  Abdominal:     General: Bowel sounds are normal. There is no distension.     Palpations: Abdomen is soft.     Tenderness: There is no abdominal tenderness. There is no guarding.     Comments: Soft, non tender, reducible right inguinal hernia.   Genitourinary:    Comments: No cva tenderness. Musculoskeletal:        General: No swelling or tenderness.     Cervical back: Normal range of motion and neck supple. No rigidity.  Skin:    General: Skin is warm and dry.     Findings: No rash.  Neurological:     Mental Status: He is alert.     Comments: Alert, speech clear. No dysarthria or aphasia. Motor/sens grossly intact bil.  Pt oriented.   Psychiatric:        Mood and Affect: Mood normal.    ED Results / Procedures / Treatments   Labs (all labs ordered are listed, but only abnormal results are displayed) Results for orders placed or performed during the hospital encounter of 11/07/21  Comprehensive metabolic panel  Result Value Ref Range   Sodium 135 135 - 145 mmol/L   Potassium 4.5 3.5 - 5.1 mmol/L   Chloride 105 98 - 111 mmol/L   CO2 23 22 - 32 mmol/L   Glucose, Bld 162 (H) 70 - 99 mg/dL   BUN 14 8 - 23 mg/dL   Creatinine, Ser 0.80 0.61 - 1.24 mg/dL   Calcium 8.8 (L) 8.9 - 10.3 mg/dL   Total Protein 6.2 (L) 6.5 - 8.1 g/dL   Albumin 3.0 (L) 3.5 - 5.0 g/dL   AST 47 (H) 15 - 41 U/L   ALT 34 0 - 44 U/L   Alkaline Phosphatase 65 38 - 126 U/L   Total Bilirubin 4.0 (H) 0.3 - 1.2 mg/dL   GFR, Estimated >60 >60 mL/min   Anion gap 7 5 - 15  CBC  Result Value Ref Range   WBC 5.2 4.0 - 10.5 K/uL   RBC 3.96 (L) 4.22 -  5.81 MIL/uL   Hemoglobin 13.6 13.0 - 17.0 g/dL   HCT 39.0 39.0 - 52.0 %   MCV 98.5 80.0 - 100.0 fL   MCH 34.3 (H) 26.0 - 34.0 pg  MCHC 34.9 30.0 - 36.0 g/dL   RDW 14.3 11.5 - 15.5 %   Platelets 81 (L) 150 - 400 K/uL   nRBC 0.0 0.0 - 0.2 %  Protime-INR  Result Value Ref Range   Prothrombin Time 19.3 (H) 11.4 - 15.2 seconds   INR 1.6 (H) 0.8 - 1.2  Ammonia  Result Value Ref Range   Ammonia 82 (H) 9 - 35 umol/L  CBG monitoring, ED  Result Value Ref Range   Glucose-Capillary 160 (H) 70 - 99 mg/dL   [place] ASCITES (ABDOMEN LIMITED)  Result Date: 10/10/2021 CLINICAL DATA:  Ascites.  Evaluate for paracentesis. EXAM: LIMITED ABDOMEN ULTRASOUND FOR ASCITES TECHNIQUE: Limited ultrasound survey for ascites was performed in all four abdominal quadrants. COMPARISON:  IR ultrasound, 07/11/2021 and 06/30/2021. CT AP, 06/01/2021. FINDINGS: A small volume of ascites is present within the 4 quadrants. A safe window for paracentesis was not appreciated. The procedure was aborted. IMPRESSION: Small volume intra-abdominal ascites. No safe window for paracentesis. Examination performed by Narda Rutherford, interventional radiology NP. Electronically Signed   By: Michaelle Birks M.D.   On: 10/10/2021 17:11    EKG None  Radiology No results found.  Procedures Procedures    Medications Ordered in ED Medications - No data to display  ED Course/ Medical Decision Making/ A&P                           Medical Decision Making Problems Addressed: Alcoholic cirrhosis, unspecified whether ascites present [facility]): chronic illness or injury with exacerbation, progression, or side effects of treatment that poses a threat to life or bodily functions Hyperammonemia ([place]): acute illness or injury with systemic symptoms  Amount and/or Complexity of Data Reviewed Independent Historian: spouse    Details: additional hx provided by family. External Data Reviewed: labs, radiology and notes. Labs: ordered. Decision-making  details documented in ED Course.  Risk Prescription drug management. Decision regarding hospitalization.  CBG normal. Labs sent.  Continuous pulse ox and cardiac monitoring. Given hx cirrhosis, confusion, ?hyperammonemia, considered disposition of admit to hospital - will get labs, reassess patient, start therapy, and reconsider dispo decision.   Reviewed nursing notes and prior charts for additional history. External reports reviewed.   Cardiac monitor: sinus rhythm, rate 60.   Labs reviewed/interpreted by me - ammonia elevated, 82, inr mildly high. Wbc normal.   Lactulose po.  On additional discussion w pt/spouse - pts spouse indicates as patient was feeling so good, he had backed off on his lactulose to 1x/day or 2x/day. As overall pt appears well, not toxic, is alert, oriented, feel amenable to getting back on his lactulose, will bump up dose for next few days (as it is already written), and rec close pcp f/u.  Return precautions provided.          Final Clinical Impression(s) / ED Diagnoses Final diagnoses:  None    Rx / DC Orders ED Discharge Orders     None         Lajean Saver, MD 11/07/21 1442

## 2021-11-07 NOTE — ED Notes (Signed)
Patient aware urine sample is needed. Provided with soda. Urinal at bedside.

## 2021-11-11 DIAGNOSIS — K4090 Unilateral inguinal hernia, without obstruction or gangrene, not specified as recurrent: Secondary | ICD-10-CM | POA: Diagnosis not present

## 2021-11-11 DIAGNOSIS — K439 Ventral hernia without obstruction or gangrene: Secondary | ICD-10-CM | POA: Diagnosis not present

## 2021-11-17 DIAGNOSIS — K219 Gastro-esophageal reflux disease without esophagitis: Secondary | ICD-10-CM | POA: Diagnosis not present

## 2021-11-17 DIAGNOSIS — I10 Essential (primary) hypertension: Secondary | ICD-10-CM | POA: Diagnosis not present

## 2021-11-17 DIAGNOSIS — E785 Hyperlipidemia, unspecified: Secondary | ICD-10-CM | POA: Diagnosis not present

## 2021-11-27 DIAGNOSIS — K7031 Alcoholic cirrhosis of liver with ascites: Secondary | ICD-10-CM | POA: Diagnosis not present

## 2021-11-27 DIAGNOSIS — K4090 Unilateral inguinal hernia, without obstruction or gangrene, not specified as recurrent: Secondary | ICD-10-CM | POA: Diagnosis not present

## 2021-12-08 NOTE — Progress Notes (Addendum)
Peter Nichols  12/08/2021   Your procedure is scheduled on:          12/23/21   Report to [facility] Main  Entrance   Report to admitting at AM     Call this number if you have problems the morning of surgery [phone]    Remember: Do not eat food , candy gum or mints :After Midnight. The nite before surgery.   You may have clear liquids from midnight until __   1100am  day of sourgery.   Please complete G2 lower sugar drink by 1100am morning of surgery.    CLEAR LIQUID DIET   Foods Allowed                                                                       Coffee and tea, regular and decaf                              Plain Jell-O any favor except red or purple                                            Fruit ices (not with fruit pulp)                                      Iced Popsicles                                     Carbonated beverages, regular and diet                                    Cranberry, grape and apple juices Sports drinks like Gatorade Lightly seasoned clear broth or consume(fat free) Sugar   _____________________________________________________________________    BRUSH YOUR TEETH MORNING OF SURGERY AND RINSE YOUR MOUTH OUT, NO CHEWING GUM CANDY OR MINTS.     Take these medicines the morning of surgery with A SIP OF WATER:  Protonix, propanolol   DO NOT TAKE ANY DIABETIC MEDICATIONS DAY OF YOUR SURGERY                               You may not have any metal on your body including hair pins and              piercings  Do not wear jewelry, make-up, lotions, powders or perfumes, deodorant             Do not wear nail polish on your fingernails.  Do not shave  48 hours prior to surgery.              Men may shave face and neck.   Do not bring valuables  to the hospital. [place].  Contacts, dentures or bridgework may not be worn into surgery.  Leave suitcase in the car.  After surgery it may be brought to your room.     Patients discharged the day of surgery will not be allowed to drive home. IF YOU ARE HAVING SURGERY AND GOING HOME THE SAME DAY, YOU MUST HAVE AN ADULT TO DRIVE YOU HOME AND BE WITH YOU FOR 24 HOURS. YOU MAY GO HOME BY TAXI OR UBER OR ORTHERWISE, BUT AN ADULT MUST ACCOMPANY YOU HOME AND STAY WITH YOU FOR 24 HOURS.  Name and phone number of your driver:  Special Instructions: N/A              Please read over the following fact sheets you were given: _____________________________________________________________________  [facility] - Preparing for Surgery Before surgery, you can play an important role.  Because skin is not sterile, your skin needs to be as free of germs as possible.  You can reduce the number of germs on your skin by washing with CHG (chlorahexidine gluconate) soap before surgery.  CHG is an antiseptic cleaner which kills germs and bonds with the skin to continue killing germs even after washing. Please DO NOT use if you have an allergy to CHG or antibacterial soaps.  If your skin becomes reddened/irritated stop using the CHG and inform your nurse when you arrive at Short Stay. Do not shave (including legs and underarms) for at least 48 hours prior to the first CHG shower.  You may shave your face/neck. Please follow these instructions carefully:  1.  Shower with CHG Soap the night before surgery and the  morning of Surgery.  2.  If you choose to wash your hair, wash your hair first as usual with your  normal  shampoo.  3.  After you shampoo, rinse your hair and body thoroughly to remove the  shampoo.                           4.  Use CHG as you would any other liquid soap.  You can apply chg directly  to the skin and wash                       Gently with a scrungie or clean washcloth.  5.  Apply the CHG Soap to your body ONLY FROM THE NECK DOWN.   Do not use on face/ open                           Wound or open sores. Avoid  contact with eyes, ears mouth and genitals (private parts).                       Wash face,  Genitals (private parts) with your normal soap.             6.  Wash thoroughly, paying special attention to the area where your surgery  will be performed.  7.  Thoroughly rinse your body with warm water from the neck down.  8.  DO NOT shower/wash with your normal soap after using and rinsing off  the CHG Soap.                9.  Pat yourself dry with a clean towel.            10.  Wear clean pajamas.            11.  Place clean sheets on your bed the night of your first shower and do not  sleep with pets. Day of Surgery : Do not apply any lotions/deodorants the morning of surgery.  Please wear clean clothes to the hospital/surgery center.  FAILURE TO FOLLOW THESE INSTRUCTIONS MAY RESULT IN THE CANCELLATION OF YOUR SURGERY PATIENT SIGNATURE_________________________________  NURSE SIGNATURE__________________________________  ________________________________________________________________________

## 2021-12-08 NOTE — Progress Notes (Addendum)
Anesthesia Review:  PCP: DR [name]  Cardiologist : none  Chest x-ray : EKG : 05/28/21  Echo : Stress test: Cardiac Cath :  Activity level: can do ao flight of stairs without difficulty  Sleep Study/ CPAP : none  Fasting Blood Sugar :      / Checks Blood Sugar -- times a day:   Blood Thinner/ Instructions /Last Dose: ASA / Instructions/ Last Dose :   NO covid test ambulatory surgery  PT stated at preop he had questions regarding surgery that DR Hassell Done has not answered and pt would like for postop appt be in [place] instead of [place].  Called [facility] and spoke with Abigail Butts ( on speakerphone) and made her aware of above.  Abigail Butts to contact Dr Hassell Done and have him call wife Izora Gala [phone]) per  Pt requested to have questions answered in regards to surgery and Abigail Butts to call wife back in regards to postop appt.  Informed wife, Izora Gala of above at end of preop appt along with daughter.   Question arose at preop regarding Lactulose and pt takes for confusion per pt.  Asked Shawn Stall about lactulose am of surgery.  Per Lyndon Code, PA okay for pt to take am of surgery.  Placed on preop instructions and reviewed with pt.   PT voiced understanding.  PT/INR done 12/10/21- routed to DR Hassell Done.  Pulse 54 at preop on 12/10/21.  Last ekg in 05/2021 pulse was 52.   CBC done 12/10/21- routed to DR Johnathan Hausen .  Shawn Stall  made aware of CBC results.  No new orders given.

## 2021-12-09 NOTE — Progress Notes (Signed)
Sent message, via epic in basket, requesting orders in epic from surgeon.  

## 2021-12-10 ENCOUNTER — Ambulatory Visit: Payer: Self-pay | Admitting: Surgery

## 2021-12-10 ENCOUNTER — Other Ambulatory Visit: Payer: Self-pay

## 2021-12-10 ENCOUNTER — Encounter (HOSPITAL_COMMUNITY)
Admission: RE | Admit: 2021-12-10 | Discharge: 2021-12-10 | Disposition: A | Discharge status: Home / Self Care | Payer: Medicare HMO | Source: Ambulatory Visit | Attending: Surgery | Admitting: Surgery

## 2021-12-10 ENCOUNTER — Encounter (HOSPITAL_COMMUNITY): Payer: Self-pay

## 2021-12-10 VITALS — BP 142/62 | HR 54 | Temp 98.3°F | Resp 18 | Ht 69.5 in | Wt 180.0 lb

## 2021-12-10 DIAGNOSIS — Z01812 Encounter for preprocedural laboratory examination: Secondary | ICD-10-CM | POA: Insufficient documentation

## 2021-12-10 DIAGNOSIS — K4090 Unilateral inguinal hernia, without obstruction or gangrene, not specified as recurrent: Secondary | ICD-10-CM

## 2021-12-10 DIAGNOSIS — K859 Acute pancreatitis without necrosis or infection, unspecified: Secondary | ICD-10-CM | POA: Insufficient documentation

## 2021-12-10 HISTORY — DX: Unspecified cirrhosis of liver: K74.60

## 2021-12-10 HISTORY — DX: Gastro-esophageal reflux disease without esophagitis: K21.9

## 2021-12-10 LAB — CBC WITH DIFFERENTIAL/PLATELET
Abs Immature Granulocytes: 0.02 10*3/uL (ref 0.00–0.07)
Basophils Absolute: 0.1 10*3/uL (ref 0.0–0.1)
Basophils Relative: 1 %
Eosinophils Absolute: 0.2 10*3/uL (ref 0.0–0.5)
Eosinophils Relative: 5 %
HCT: 42.2 % (ref 39.0–52.0)
Hemoglobin: 14.5 g/dL (ref 13.0–17.0)
Immature Granulocytes: 0 %
Lymphocytes Relative: 22 %
Lymphs Abs: 1 10*3/uL (ref 0.7–4.0)
MCH: 34.4 pg — ABNORMAL HIGH (ref 26.0–34.0)
MCHC: 34.4 g/dL (ref 30.0–36.0)
MCV: 100 fL (ref 80.0–100.0)
Monocytes Absolute: 0.6 10*3/uL (ref 0.1–1.0)
Monocytes Relative: 13 %
Neutro Abs: 2.6 10*3/uL (ref 1.7–7.7)
Neutrophils Relative %: 59 %
Platelets: UNDETERMINED 10*3/uL (ref 150–400)
RBC: 4.22 MIL/uL (ref 4.22–5.81)
RDW: 14.5 % (ref 11.5–15.5)
WBC: 4.5 10*3/uL (ref 4.0–10.5)
nRBC: 0 % (ref 0.0–0.2)

## 2021-12-10 LAB — APTT: aPTT: 35 seconds (ref 24–36)

## 2021-12-10 LAB — COMPREHENSIVE METABOLIC PANEL
ALT: 33 U/L (ref 0–44)
AST: 47 U/L — ABNORMAL HIGH (ref 15–41)
Albumin: 3.1 g/dL — ABNORMAL LOW (ref 3.5–5.0)
Alkaline Phosphatase: 72 U/L (ref 38–126)
Anion gap: 4 — ABNORMAL LOW (ref 5–15)
BUN: 11 mg/dL (ref 8–23)
CO2: 28 mmol/L (ref 22–32)
Calcium: 8.9 mg/dL (ref 8.9–10.3)
Chloride: 103 mmol/L (ref 98–111)
Creatinine, Ser: 0.89 mg/dL (ref 0.61–1.24)
GFR, Estimated: >60 mL/min (ref >60)
Glucose, Bld: 147 mg/dL — ABNORMAL HIGH (ref 70–99)
Potassium: 4 mmol/L (ref 3.5–5.1)
Sodium: 135 mmol/L (ref 135–145)
Total Bilirubin: 6.1 mg/dL — ABNORMAL HIGH (ref 0.3–1.2)
Total Protein: 6.4 g/dL — ABNORMAL LOW (ref 6.5–8.1)

## 2021-12-10 LAB — PROTIME-INR
INR: 1.6 — ABNORMAL HIGH (ref 0.8–1.2)
Prothrombin Time: 18.8 seconds — ABNORMAL HIGH (ref 11.4–15.2)

## 2021-12-15 NOTE — Anesthesia Preprocedure Evaluation (Addendum)
Anesthesia Evaluation  Patient identified by MRN, date of birth, ID band Patient awake    Reviewed: Allergy & Precautions, NPO status , Patient's Chart, lab work & pertinent test results  Airway Mallampati: I  TM Distance: >3 FB Neck ROM: Full    Dental no notable dental hx. (+) Chipped, Poor Dentition, Dental Advisory Given   Pulmonary neg pulmonary ROS,    Pulmonary exam normal breath sounds clear to auscultation       Cardiovascular hypertension, Pt. on medications Normal cardiovascular exam Rhythm:Regular Rate:Normal     Neuro/Psych negative neurological ROS     GI/Hepatic GERD  ,(+) Cirrhosis       ,   Endo/Other    Renal/GU Lab Results      Component                Value               Date                      CREATININE               0.89                12/10/2021                K                        4.0                 12/10/2021                           Musculoskeletal   Abdominal   Peds  Hematology  (+) Blood dyscrasia, , Lab Results      Component                Value               Date                      WBC                      4.5                 12/10/2021                HGB                      14.5                12/10/2021                HCT                      42.2                12/10/2021                MCV                      100.0               12/10/2021                PLT  12/10/2021            PLATELET CLUMPS NOTED ON SMEAR, UNABLE TO ESTIMATE Lab Results      Component                Value               Date                      WBC                      10.3                12/23/2021                HGB                      13.8                12/23/2021                HCT                      40.9                12/23/2021                MCV                      102.0 (H)           12/23/2021                PLT                       116 (L)             12/23/2021             INR 1.7   Anesthesia Other Findings   Reproductive/Obstetrics                          Anesthesia Physical Anesthesia Plan  ASA: 3  Anesthesia Plan: General   Post-op Pain Management: Regional block* and Minimal or no pain anticipated   Induction: Intravenous  PONV Risk Score and Plan: 3 and Treatment may vary due to age or medical condition, Midazolam, Dexamethasone and Ondansetron  Airway Management Planned: Oral ETT  Additional Equipment: None  Intra-op Plan:   Post-operative Plan: Extubation in OR  Informed Consent: I have reviewed the patients History and Physical, chart, labs and discussed the procedure including the risks, benefits and alternatives for the proposed anesthesia with the patient or authorized representative who has indicated his/her understanding and acceptance.     Dental advisory given  Plan Discussed with: CRNA and Anesthesiologist  Anesthesia Plan Comments: (See PAT note 12/10/21, Konrad Felix Ward, PA-C  )     Anesthesia Quick Evaluation

## 2021-12-15 NOTE — Progress Notes (Signed)
Anesthesia Chart Review   Case: [id number] Date/Time: 12/23/21 1345   Procedure: RIGHT INGUINAL HERNIA REPAIR- OPEN (Right)   Anesthesia type: General   Pre-op diagnosis: RIGHT INGUINAL HERNIA   Location: [facility] ROOM 05 / [facility]   Surgeons: Johnathan Hausen, MD       DISCUSSION:76 y.o. never smoker with h/o HTN, GERD, alcoholic cirrhosis with ascites with last paracentesis 07/11/2021, right inguinal hernia scheduled for above procedure 12/23/2021 with Dr. Johnathan Hausen.   Recent ED visit due to altered mental status.  Pt had decreased lactulose from BID to once daily.  Increased at discharge.   Anticipate pt can proceed with planned procedure barring acute status change and after evaluation DOS.  VS: BP (!) 142/62    Pulse (!) 54    Temp 36.8 C (Oral)    Resp 18    Ht 5' 9.5" (1.765 m)    Wt 81.6 kg    SpO2 97%    BMI 26.20 kg/m   PROVIDERS: [name], MD is PCP    LABS: Labs reviewed: Acceptable for surgery. (all labs ordered are listed, but only abnormal results are displayed)  Labs Reviewed  CBC WITH DIFFERENTIAL/PLATELET - Abnormal; Notable for the following components:      Result Value   MCH 34.4 (*)    All other components within normal limits  COMPREHENSIVE METABOLIC PANEL - Abnormal; Notable for the following components:   Glucose, Bld 147 (*)    Total Protein 6.4 (*)    Albumin 3.1 (*)    AST 47 (*)    Total Bilirubin 6.1 (*)    Anion gap 4 (*)    All other components within normal limits  PROTIME-INR - Abnormal; Notable for the following components:   Prothrombin Time 18.8 (*)    INR 1.6 (*)    All other components within normal limits  APTT     IMAGES:   EKG: 05/28/2021 Rate 52 bpm  Sinus rhythm  Prolonged PR interval  Low voltage, extremity and precordial leads  CV:  Past Medical History:  Diagnosis Date   Cirrhosis ([place])    Dysphagia    GERD (gastroesophageal reflux disease)    Hyperlipidemia    Hypertension    Neuromuscular disorder (HCC)     tremor   Steatohepatitis, non-alcoholic    Thrombocytopenia ([facility])     Past Surgical History:  Procedure Laterality Date   CHOLECYSTECTOMY     ESOPHAGOGASTRODUODENOSCOPY (EGD) WITH PROPOFOL Bilateral 08/06/2021   Procedure: ESOPHAGOGASTRODUODENOSCOPY (EGD) WITH PROPOFOL;  Surgeon: Arta Silence, MD;  Location: [facility];  Service: Endoscopy;  Laterality: Bilateral;   IR PARACENTESIS  06/16/2021   IR PARACENTESIS  07/11/2021   STOMACH SURGERY      MEDICATIONS:  b complex vitamins capsule   calcium carbonate (TUMS - DOSED IN MG ELEMENTAL CALCIUM) 500 MG chewable tablet   feeding supplement (ENSURE ENLIVE / ENSURE PLUS) LIQD   furosemide (LASIX) 20 MG tablet   furosemide (LASIX) 40 MG tablet   lactulose (CHRONULAC) 10 GM/15ML solution   Multiple Vitamin (MULTIVITAMIN WITH MINERALS) TABS tablet   pantoprazole (PROTONIX) 40 MG tablet   potassium chloride (KLOR-CON M) 10 MEQ tablet   potassium chloride SA (KLOR-CON) 20 MEQ tablet   pravastatin (PRAVACHOL) 40 MG tablet   propranolol (INDERAL) 10 MG tablet   spironolactone (ALDACTONE) 50 MG tablet   No current facility-administered medications for this encounter.    Konrad Felix Ward, PA-C [facility] Pre-Surgical Testing [phone]
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
## 2021-12-23 ENCOUNTER — Ambulatory Visit (HOSPITAL_COMMUNITY)
Admission: RE | Admit: 2021-12-23 | Discharge: 2021-12-23 | Disposition: A | Discharge status: Home / Self Care | Payer: Medicare HMO | Source: Ambulatory Visit | Attending: Surgery | Admitting: Surgery

## 2021-12-23 ENCOUNTER — Other Ambulatory Visit: Payer: Self-pay

## 2021-12-23 ENCOUNTER — Encounter (HOSPITAL_COMMUNITY)
Admission: RE | Disposition: A | Discharge status: Home / Self Care | Payer: Self-pay | Source: Ambulatory Visit | Attending: Surgery

## 2021-12-23 ENCOUNTER — Ambulatory Visit (HOSPITAL_COMMUNITY): Payer: Medicare HMO | Admitting: Physician Assistant

## 2021-12-23 ENCOUNTER — Ambulatory Visit (HOSPITAL_BASED_OUTPATIENT_CLINIC_OR_DEPARTMENT_OTHER): Payer: Medicare HMO | Admitting: Physician Assistant

## 2021-12-23 ENCOUNTER — Encounter (HOSPITAL_COMMUNITY): Payer: Self-pay | Admitting: Surgery

## 2021-12-23 DIAGNOSIS — K219 Gastro-esophageal reflux disease without esophagitis: Secondary | ICD-10-CM | POA: Insufficient documentation

## 2021-12-23 DIAGNOSIS — K4090 Unilateral inguinal hernia, without obstruction or gangrene, not specified as recurrent: Secondary | ICD-10-CM | POA: Insufficient documentation

## 2021-12-23 DIAGNOSIS — I10 Essential (primary) hypertension: Secondary | ICD-10-CM | POA: Insufficient documentation

## 2021-12-23 DIAGNOSIS — K7460 Unspecified cirrhosis of liver: Secondary | ICD-10-CM | POA: Diagnosis not present

## 2021-12-23 DIAGNOSIS — K859 Acute pancreatitis without necrosis or infection, unspecified: Secondary | ICD-10-CM

## 2021-12-23 DIAGNOSIS — R188 Other ascites: Secondary | ICD-10-CM

## 2021-12-23 DIAGNOSIS — K7031 Alcoholic cirrhosis of liver with ascites: Secondary | ICD-10-CM | POA: Diagnosis not present

## 2021-12-23 DIAGNOSIS — Z01818 Encounter for other preprocedural examination: Secondary | ICD-10-CM

## 2021-12-23 HISTORY — PX: INGUINAL HERNIA REPAIR: SHX194

## 2021-12-23 LAB — CBC WITH DIFFERENTIAL/PLATELET
Abs Immature Granulocytes: 0.07 10*3/uL (ref 0.00–0.07)
Basophils Absolute: 0 10*3/uL (ref 0.0–0.1)
Basophils Relative: 0 %
Eosinophils Absolute: 0.2 10*3/uL (ref 0.0–0.5)
Eosinophils Relative: 2 %
HCT: 40.9 % (ref 39.0–52.0)
Hemoglobin: 13.8 g/dL (ref 13.0–17.0)
Immature Granulocytes: 1 %
Lymphocytes Relative: 11 %
Lymphs Abs: 1.1 10*3/uL (ref 0.7–4.0)
MCH: 34.4 pg — ABNORMAL HIGH (ref 26.0–34.0)
MCHC: 33.7 g/dL (ref 30.0–36.0)
MCV: 102 fL — ABNORMAL HIGH (ref 80.0–100.0)
Monocytes Absolute: 1.1 10*3/uL — ABNORMAL HIGH (ref 0.1–1.0)
Monocytes Relative: 11 %
Neutro Abs: 7.9 10*3/uL — ABNORMAL HIGH (ref 1.7–7.7)
Neutrophils Relative %: 75 %
Platelets: 116 10*3/uL — ABNORMAL LOW (ref 150–400)
RBC: 4.01 MIL/uL — ABNORMAL LOW (ref 4.22–5.81)
RDW: 14.3 % (ref 11.5–15.5)
WBC: 10.3 10*3/uL (ref 4.0–10.5)
nRBC: 0 % (ref 0.0–0.2)

## 2021-12-23 LAB — COMPREHENSIVE METABOLIC PANEL
ALT: 26 U/L (ref 0–44)
AST: 41 U/L (ref 15–41)
Albumin: 2.7 g/dL — ABNORMAL LOW (ref 3.5–5.0)
Alkaline Phosphatase: 67 U/L (ref 38–126)
Anion gap: 5 (ref 5–15)
BUN: 20 mg/dL (ref 8–23)
CO2: 23 mmol/L (ref 22–32)
Calcium: 8.4 mg/dL — ABNORMAL LOW (ref 8.9–10.3)
Chloride: 102 mmol/L (ref 98–111)
Creatinine, Ser: 1.07 mg/dL (ref 0.61–1.24)
GFR, Estimated: >60 mL/min (ref >60)
Glucose, Bld: 122 mg/dL — ABNORMAL HIGH (ref 70–99)
Potassium: 4.8 mmol/L (ref 3.5–5.1)
Sodium: 130 mmol/L — ABNORMAL LOW (ref 135–145)
Total Bilirubin: 7.8 mg/dL — ABNORMAL HIGH (ref 0.3–1.2)
Total Protein: 5.9 g/dL — ABNORMAL LOW (ref 6.5–8.1)

## 2021-12-23 LAB — PROTIME-INR
INR: 1.7 — ABNORMAL HIGH (ref 0.8–1.2)
Prothrombin Time: 20.3 seconds — ABNORMAL HIGH (ref 11.4–15.2)

## 2021-12-23 LAB — APTT: aPTT: 34 seconds (ref 24–36)

## 2021-12-23 SURGERY — REPAIR, HERNIA, INGUINAL, ADULT
Anesthesia: General | Site: Inguinal | Laterality: Right

## 2021-12-23 MED ORDER — OXYCODONE HCL 5 MG PO TABS
ORAL_TABLET | ORAL | Status: AC
  Filled 2021-12-23: qty 1

## 2021-12-23 MED ORDER — ALBUMIN HUMAN 5 % IV SOLN: INTRAVENOUS | Status: DC | PRN

## 2021-12-23 MED ORDER — PROPOFOL 10 MG/ML IV BOLUS
INTRAVENOUS | Status: DC | PRN
  Administered 2021-12-23: 120 mg via INTRAVENOUS

## 2021-12-23 MED ORDER — ROCURONIUM BROMIDE 10 MG/ML (PF) SYRINGE
PREFILLED_SYRINGE | INTRAVENOUS | Status: DC | PRN
  Administered 2021-12-23: 50 mg via INTRAVENOUS
  Administered 2021-12-23: 20 mg via INTRAVENOUS

## 2021-12-23 MED ORDER — SODIUM CHLORIDE 0.9 % IV SOLN: INTRAVENOUS | Status: DC | PRN

## 2021-12-23 MED ORDER — PHENYLEPHRINE HCL (PRESSORS) 10 MG/ML IV SOLN
INTRAVENOUS | Status: AC
  Filled 2021-12-23: qty 1

## 2021-12-23 MED ORDER — ACETAMINOPHEN 10 MG/ML IV SOLN: 1000.0000 mg | Freq: Once | INTRAVENOUS | Status: DC | PRN

## 2021-12-23 MED ORDER — ONDANSETRON HCL 4 MG/2ML IJ SOLN: 4.0000 mg | Freq: Once | INTRAMUSCULAR | Status: DC | PRN

## 2021-12-23 MED ORDER — ROCURONIUM BROMIDE 10 MG/ML (PF) SYRINGE
PREFILLED_SYRINGE | INTRAVENOUS | Status: AC
  Filled 2021-12-23: qty 10

## 2021-12-23 MED ORDER — ONDANSETRON HCL 4 MG/2ML IJ SOLN
INTRAMUSCULAR | Status: DC | PRN
  Administered 2021-12-23: 4 mg via INTRAVENOUS

## 2021-12-23 MED ORDER — ONDANSETRON HCL 4 MG/2ML IJ SOLN
INTRAMUSCULAR | Status: AC
  Filled 2021-12-23: qty 2

## 2021-12-23 MED ORDER — CHLORHEXIDINE GLUCONATE 0.12 % MT SOLN
15.0000 mL | Freq: Once | OROMUCOSAL | Status: AC
  Administered 2021-12-23: 15 mL via OROMUCOSAL

## 2021-12-23 MED ORDER — 0.9 % SODIUM CHLORIDE (POUR BTL) OPTIME
TOPICAL | Status: DC | PRN
  Administered 2021-12-23: 1000 mL

## 2021-12-23 MED ORDER — OXYCODONE HCL 5 MG PO TABS: 5.0000 mg | ORAL_TABLET | Freq: Four times a day (QID) | ORAL | 0 refills | Status: DC | PRN

## 2021-12-23 MED ORDER — LIDOCAINE HCL (PF) 2 % IJ SOLN
INTRAMUSCULAR | Status: AC
  Filled 2021-12-23: qty 5

## 2021-12-23 MED ORDER — FENTANYL CITRATE (PF) 100 MCG/2ML IJ SOLN
INTRAMUSCULAR | Status: DC | PRN
  Administered 2021-12-23 (×2): 50 ug via INTRAVENOUS

## 2021-12-23 MED ORDER — OXYCODONE HCL 5 MG PO TABS
5.0000 mg | ORAL_TABLET | Freq: Once | ORAL | Status: AC
  Administered 2021-12-23: 5 mg via ORAL

## 2021-12-23 MED ORDER — LACTATED RINGERS IV SOLN: INTRAVENOUS | Status: DC

## 2021-12-23 MED ORDER — BUPIVACAINE LIPOSOME 1.3 % IJ SUSP
INTRAMUSCULAR | Status: AC
  Filled 2021-12-23: qty 20

## 2021-12-23 MED ORDER — ORAL CARE MOUTH RINSE: 15.0000 mL | Freq: Once | OROMUCOSAL | Status: AC

## 2021-12-23 MED ORDER — FENTANYL CITRATE PF 50 MCG/ML IJ SOSY: 25.0000 ug | PREFILLED_SYRINGE | INTRAMUSCULAR | Status: DC | PRN

## 2021-12-23 MED ORDER — BUPIVACAINE LIPOSOME 1.3 % IJ SUSP: 20.0000 mL | Freq: Once | INTRAMUSCULAR | Status: DC

## 2021-12-23 MED ORDER — CHLORHEXIDINE GLUCONATE CLOTH 2 % EX PADS: 6.0000 | MEDICATED_PAD | Freq: Once | CUTANEOUS | Status: DC

## 2021-12-23 MED ORDER — BUPIVACAINE LIPOSOME 1.3 % IJ SUSP
INTRAMUSCULAR | Status: DC | PRN
  Administered 2021-12-23: 20 mL

## 2021-12-23 MED ORDER — PROPOFOL 10 MG/ML IV BOLUS
INTRAVENOUS | Status: AC
  Filled 2021-12-23: qty 20

## 2021-12-23 MED ORDER — FENTANYL CITRATE (PF) 100 MCG/2ML IJ SOLN
INTRAMUSCULAR | Status: AC
  Filled 2021-12-23: qty 2

## 2021-12-23 MED ORDER — PHENYLEPHRINE HCL-NACL 20-0.9 MG/250ML-% IV SOLN
INTRAVENOUS | Status: DC | PRN
  Administered 2021-12-23: 50 ug/min via INTRAVENOUS

## 2021-12-23 MED ORDER — CEFAZOLIN SODIUM-DEXTROSE 2-4 GM/100ML-% IV SOLN
2.0000 g | INTRAVENOUS | Status: AC
  Administered 2021-12-23: 2 g via INTRAVENOUS
  Filled 2021-12-23: qty 100

## 2021-12-23 MED ORDER — DEXAMETHASONE SODIUM PHOSPHATE 10 MG/ML IJ SOLN
INTRAMUSCULAR | Status: AC
  Filled 2021-12-23: qty 1

## 2021-12-23 MED ORDER — DEXAMETHASONE SODIUM PHOSPHATE 10 MG/ML IJ SOLN
INTRAMUSCULAR | Status: DC | PRN
  Administered 2021-12-23: 10 mg via INTRAVENOUS

## 2021-12-23 MED ORDER — SCOPOLAMINE 1 MG/3DAYS TD PT72
1.0000 | MEDICATED_PATCH | TRANSDERMAL | Status: DC
  Administered 2021-12-23: 1.5 mg via TRANSDERMAL
  Filled 2021-12-23: qty 1

## 2021-12-23 MED ORDER — LIDOCAINE 2% (20 MG/ML) 5 ML SYRINGE
INTRAMUSCULAR | Status: DC | PRN
  Administered 2021-12-23: 80 mg via INTRAVENOUS

## 2021-12-23 MED ORDER — SUGAMMADEX SODIUM 200 MG/2ML IV SOLN
INTRAVENOUS | Status: DC | PRN
Start: 2021-12-23 — End: 2021-12-23
  Administered 2021-12-23: 200 mg via INTRAVENOUS

## 2021-12-23 SURGICAL SUPPLY — 35 items
ADH SKN CLS APL DERMABOND .7 (GAUZE/BANDAGES/DRESSINGS) ×1
BAG COUNTER SPONGE SURGICOUNT (BAG) IMPLANT
BAG SPNG CNTER NS LX DISP (BAG)
BLADE SURG 15 STRL LF DISP TIS (BLADE) ×1 IMPLANT
BLADE SURG 15 STRL SS (BLADE) ×2
COVER SURGICAL LIGHT HANDLE (MISCELLANEOUS) ×2 IMPLANT
DERMABOND ADVANCED (GAUZE/BANDAGES/DRESSINGS) ×1
DERMABOND ADVANCED .7 DNX12 (GAUZE/BANDAGES/DRESSINGS) IMPLANT
DISSECTOR ROUND CHERRY 3/8 STR (MISCELLANEOUS) IMPLANT
DRAIN PENROSE 0.5X18 (DRAIN) ×2 IMPLANT
DRAPE LAPAROTOMY TRNSV 102X78 (DRAPES) ×2 IMPLANT
ELECT REM PT RETURN 15FT ADLT (MISCELLANEOUS) ×2 IMPLANT
GLOVE SURG ENC TEXT LTX SZ8 (GLOVE) ×2 IMPLANT
GOWN STRL REUS W/ TWL XL LVL3 (GOWN DISPOSABLE) IMPLANT
GOWN STRL REUS W/TWL XL LVL3 (GOWN DISPOSABLE) ×4 IMPLANT
KIT BASIN OR (CUSTOM PROCEDURE TRAY) ×2 IMPLANT
KIT TURNOVER KIT A (KITS) ×1 IMPLANT
MARKER SKIN DUAL TIP RULER LAB (MISCELLANEOUS) ×2 IMPLANT
MESH HERNIA 3X6 (Mesh General) ×1 IMPLANT
NEEDLE HYPO 22GX1.5 SAFETY (NEEDLE) ×2 IMPLANT
PACK BASIC VI WITH GOWN DISP (CUSTOM PROCEDURE TRAY) ×2 IMPLANT
PENCIL SMOKE EVACUATOR (MISCELLANEOUS) IMPLANT
SPIKE FLUID TRANSFER (MISCELLANEOUS) ×1 IMPLANT
SPONGE T-LAP 4X18 ~~LOC~~+RFID (SPONGE) ×2 IMPLANT
STAPLER VISISTAT 35W (STAPLE) IMPLANT
SUT PROLENE 2 0 CT2 30 (SUTURE) ×4 IMPLANT
SUT SILK 2 0 SH (SUTURE) IMPLANT
SUT VIC AB 2-0 SH 27 (SUTURE) ×2
SUT VIC AB 2-0 SH 27X BRD (SUTURE) ×1 IMPLANT
SUT VIC AB 4-0 SH 18 (SUTURE) ×2 IMPLANT
SUT VICRYL 4-0 (SUTURE) ×2 IMPLANT
SYR 20ML LL LF (SYRINGE) ×2 IMPLANT
SYR BULB IRRIG 60ML STRL (SYRINGE) ×2 IMPLANT
TOWEL OR 17X26 10 PK STRL BLUE (TOWEL DISPOSABLE) ×2 IMPLANT
TOWEL OR NON WOVEN STRL DISP B (DISPOSABLE) ×2 IMPLANT

## 2021-12-23 NOTE — Interval H&P Note (Signed)
History and Physical Interval Note: ? ?12/23/2021 ?12:44 PM ? ?Peter Nichols  has presented today for surgery, with the diagnosis of RIGHT INGUINAL HERNIA.  The various methods of treatment have been discussed with the patient and family. After consideration of risks, benefits and other options for treatment, the patient has consented to  Procedure(s): ?RIGHT INGUINAL HERNIA REPAIR- OPEN (Right) as a surgical intervention.  The patient's history has been reviewed, patient examined, no change in status, stable for surgery.  I have reviewed the patient's chart and labs.  Questions were answered to the patient's satisfaction.   ? ? ?Pedro Earls ? ? ?

## 2021-12-23 NOTE — Anesthesia Postprocedure Evaluation (Signed)
Anesthesia Post Note ? ?Patient: Peter Nichols ? ?Procedure(s) Performed: RIGHT INGUINAL HERNIA REPAIR- OPEN (Right: Inguinal) ? ?  ? ?Patient location during evaluation: PACU ?Anesthesia Type: General ?Level of consciousness: awake and alert ?Pain management: pain level controlled ?Vital Signs Assessment: post-procedure vital signs reviewed and stable ?Respiratory status: spontaneous breathing, nonlabored ventilation, respiratory function stable and patient connected to nasal cannula oxygen ?Cardiovascular status: blood pressure returned to baseline and stable ?Postop Assessment: no apparent nausea or vomiting ?Anesthetic complications: no ? ? ?No notable events documented. ? ?Last Vitals:  ?Vitals:  ? 12/23/21 1705 12/23/21 1800  ?BP: 124/62 (!) 113/57  ?Pulse: (!) 57 (!) 57  ?Resp: 18   ?Temp:    ?SpO2: 94% 96%  ?  ?Last Pain:  ?Vitals:  ? 12/23/21 1800  ?TempSrc:   ?PainSc: 3   ? ? ?  ?  ?  ?  ?  ?  ? ?Barnet Glasgow ? ? ? ? ?

## 2021-12-23 NOTE — Anesthesia Procedure Notes (Signed)
Procedure Name: Intubation ?Date/Time: 12/23/2021 2:59 PM ?Performed by: Sharlette Dense, CRNA ?Pre-anesthesia Checklist: Patient identified, Emergency Drugs available, Suction available and Patient being monitored ?Patient Re-evaluated:Patient Re-evaluated prior to induction ?Oxygen Delivery Method: Circle system utilized ?Preoxygenation: Pre-oxygenation with 100% oxygen ?Induction Type: IV induction ?Ventilation: Mask ventilation without difficulty and Oral airway inserted - appropriate to patient size ?Laryngoscope Size: Sabra Heck and 3 ?Grade View: Grade I ?Tube type: Oral ?Number of attempts: 1 ?Airway Equipment and Method: Stylet ?Placement Confirmation: ETT inserted through vocal cords under direct vision, positive ETCO2 and breath sounds checked- equal and bilateral ?Secured at: 22 cm ?Tube secured with: Tape ?Dental Injury: Teeth and Oropharynx as per pre-operative assessment  ? ? ? ? ?

## 2021-12-23 NOTE — Transfer of Care (Signed)
Immediate Anesthesia Transfer of Care Note ? ?Patient: Peter Nichols ? ?Procedure(s) Performed: RIGHT INGUINAL HERNIA REPAIR- OPEN (Right: Inguinal) ? ?Patient Location: PACU ? ?Anesthesia Type:General ? ?Level of Consciousness: awake ? ?Airway & Oxygen Therapy: Patient Spontanous Breathing and Patient connected to face mask oxygen ? ?Post-op Assessment: Report given to RN and Post -op Vital signs reviewed and stable ? ?Post vital signs: Reviewed and stable ? ?Last Vitals:  ?Vitals Value Taken Time  ?BP 119/61 12/23/21 1604  ?Temp    ?Pulse 62 12/23/21 1606  ?Resp 18 12/23/21 1606  ?SpO2 100 % 12/23/21 1606  ?Vitals shown include unvalidated device data. ? ?Last Pain:  ?Vitals:  ? 12/23/21 1230  ?TempSrc: Oral  ?PainSc: 0-No pain  ?   ? ?  ? ?Complications: No notable events documented. ?

## 2021-12-23 NOTE — Op Note (Signed)
Peter Nichols  08/03/1946 ? ? ?12/23/2021 ? ? ? ?PCP:  [name], MD ? ? ?Surgeon: Kaylyn Lim, MD, FACS ? ?Asst:  Albin Felling, MD ? ?Anes:  general ? ?Preop Dx: Right inguinal hernia with ascites ?Postop Dx: Same-large indirect ? ?Procedure: Open right inguinal herniorrhaphy with mesh ?Location Surgery: [facility] OR 2 ?Complications: None noted ? ?EBL:   minimal cc ? ?Drains: none ? ?Description of Procedure: ? The patient was taken to OR 2 .  After anesthesia was administered and the patient was prepped  with with Betadine including his scrotum and a timeout was performed.  We had preoperatively marked the right inguinal region where he had a large bulge coming out of his external ring.  With his underlying cirrhosis and ascites this was consistent with likely a indirect hernia with ascitic fluid. ? ?Oblique incision was made over this carried down and we began by dissecting the sac away from the cord structures below.  We then opened the external oblique and then dissected this up to the internal ring.  We did not enter the sac but went ahead and freed it from the scored cord structures and reduced it and held it in place and then closed the internal ring with 3 sutures of 2-0 Prolene. ? ?A piece of Marlex mesh was cut to fit and sutured along the inguinal ligament with a running 2-0 Prolene.  It was sutured medially to the internal oblique with horizontal mattress sutures of 2-0 Prolene and then sutured to itself laterally around the cord structures.  We can insert the forceps into the new ring which was snug but did not appear too tight.  The vas and the vascular structures were intact.  The external Bleich was closed with a running 0 Vicryl.  Prior to doing this we injected the surrounding area and a tap block fashion with Exparel 20 cc.  The wound was then closed in layers with 4-0 Vicryl and then with Monocryl and with Dermabond. ? ?The patient tolerated the procedure well and was taken to the PACU in stable  condition.   ? ?The resident assisted me on this case and I have prepared this operative note.   ? ? ?Matt B. Hassell Done, MD, FACS ?[facility], [place] ?[phone]  ?

## 2021-12-24 ENCOUNTER — Encounter (HOSPITAL_COMMUNITY): Payer: Self-pay | Admitting: Surgery

## 2021-12-31 ENCOUNTER — Encounter (HOSPITAL_COMMUNITY): Payer: Self-pay

## 2021-12-31 ENCOUNTER — Emergency Department (HOSPITAL_COMMUNITY): Payer: Medicare HMO

## 2021-12-31 ENCOUNTER — Inpatient Hospital Stay (HOSPITAL_COMMUNITY)
Admission: EM | Admit: 2021-12-31 | Discharge: 2022-01-02 | DRG: 442 | Disposition: A | Discharge status: Home / Self Care | Payer: Medicare HMO | Attending: Internal Medicine | Admitting: Internal Medicine

## 2021-12-31 DIAGNOSIS — R739 Hyperglycemia, unspecified: Secondary | ICD-10-CM | POA: Diagnosis present

## 2021-12-31 DIAGNOSIS — I10 Essential (primary) hypertension: Secondary | ICD-10-CM | POA: Diagnosis present

## 2021-12-31 DIAGNOSIS — E7800 Pure hypercholesterolemia, unspecified: Secondary | ICD-10-CM | POA: Diagnosis not present

## 2021-12-31 DIAGNOSIS — D696 Thrombocytopenia, unspecified: Secondary | ICD-10-CM | POA: Diagnosis not present

## 2021-12-31 DIAGNOSIS — D649 Anemia, unspecified: Secondary | ICD-10-CM | POA: Diagnosis not present

## 2021-12-31 DIAGNOSIS — Z20822 Contact with and (suspected) exposure to covid-19: Secondary | ICD-10-CM | POA: Diagnosis present

## 2021-12-31 DIAGNOSIS — D638 Anemia in other chronic diseases classified elsewhere: Secondary | ICD-10-CM | POA: Diagnosis not present

## 2021-12-31 DIAGNOSIS — Z79899 Other long term (current) drug therapy: Secondary | ICD-10-CM | POA: Diagnosis not present

## 2021-12-31 DIAGNOSIS — K7581 Nonalcoholic steatohepatitis (NASH): Secondary | ICD-10-CM | POA: Diagnosis present

## 2021-12-31 DIAGNOSIS — K7290 Hepatic failure, unspecified without coma: Secondary | ICD-10-CM | POA: Diagnosis not present

## 2021-12-31 DIAGNOSIS — D6959 Other secondary thrombocytopenia: Secondary | ICD-10-CM | POA: Diagnosis present

## 2021-12-31 DIAGNOSIS — K7682 Hepatic encephalopathy: Secondary | ICD-10-CM | POA: Diagnosis present

## 2021-12-31 DIAGNOSIS — K7460 Unspecified cirrhosis of liver: Secondary | ICD-10-CM | POA: Diagnosis present

## 2021-12-31 DIAGNOSIS — R188 Other ascites: Secondary | ICD-10-CM | POA: Diagnosis present

## 2021-12-31 DIAGNOSIS — E785 Hyperlipidemia, unspecified: Secondary | ICD-10-CM | POA: Diagnosis present

## 2021-12-31 DIAGNOSIS — R4182 Altered mental status, unspecified: Secondary | ICD-10-CM | POA: Diagnosis not present

## 2021-12-31 DIAGNOSIS — K219 Gastro-esophageal reflux disease without esophagitis: Secondary | ICD-10-CM | POA: Diagnosis present

## 2021-12-31 DIAGNOSIS — E860 Dehydration: Secondary | ICD-10-CM | POA: Diagnosis present

## 2021-12-31 LAB — CBC WITH DIFFERENTIAL/PLATELET
Abs Immature Granulocytes: 0.07 10*3/uL (ref 0.00–0.07)
Basophils Absolute: 0 10*3/uL (ref 0.0–0.1)
Basophils Relative: 1 %
Eosinophils Absolute: 0.2 10*3/uL (ref 0.0–0.5)
Eosinophils Relative: 3 %
HCT: 39.3 % (ref 39.0–52.0)
Hemoglobin: 13.7 g/dL (ref 13.0–17.0)
Immature Granulocytes: 1 %
Lymphocytes Relative: 13 %
Lymphs Abs: 0.8 10*3/uL (ref 0.7–4.0)
MCH: 34.6 pg — ABNORMAL HIGH (ref 26.0–34.0)
MCHC: 34.9 g/dL (ref 30.0–36.0)
MCV: 99.2 fL (ref 80.0–100.0)
Monocytes Absolute: 0.8 10*3/uL (ref 0.1–1.0)
Monocytes Relative: 12 %
Neutro Abs: 4.5 10*3/uL (ref 1.7–7.7)
Neutrophils Relative %: 70 %
Platelets: 105 10*3/uL — ABNORMAL LOW (ref 150–400)
RBC: 3.96 MIL/uL — ABNORMAL LOW (ref 4.22–5.81)
RDW: 14.4 % (ref 11.5–15.5)
WBC: 6.3 10*3/uL (ref 4.0–10.5)
nRBC: 0 % (ref 0.0–0.2)

## 2021-12-31 LAB — COMPREHENSIVE METABOLIC PANEL
ALT: 39 U/L (ref 0–44)
AST: 43 U/L — ABNORMAL HIGH (ref 15–41)
Albumin: 2.8 g/dL — ABNORMAL LOW (ref 3.5–5.0)
Alkaline Phosphatase: 73 U/L (ref 38–126)
Anion gap: 6 (ref 5–15)
BUN: 15 mg/dL (ref 8–23)
CO2: 24 mmol/L (ref 22–32)
Calcium: 8.7 mg/dL — ABNORMAL LOW (ref 8.9–10.3)
Chloride: 106 mmol/L (ref 98–111)
Creatinine, Ser: 0.84 mg/dL (ref 0.61–1.24)
GFR, Estimated: >60 mL/min (ref >60)
Glucose, Bld: 182 mg/dL — ABNORMAL HIGH (ref 70–99)
Potassium: 3.4 mmol/L — ABNORMAL LOW (ref 3.5–5.1)
Sodium: 136 mmol/L (ref 135–145)
Total Bilirubin: 5.7 mg/dL — ABNORMAL HIGH (ref 0.3–1.2)
Total Protein: 5.9 g/dL — ABNORMAL LOW (ref 6.5–8.1)

## 2021-12-31 LAB — RESP PANEL BY RT-PCR (FLU A&B, COVID) ARPGX2
Influenza A by PCR: NEGATIVE
Influenza B by PCR: NEGATIVE
SARS Coronavirus 2 by RT PCR: NEGATIVE

## 2021-12-31 LAB — URINALYSIS, ROUTINE W REFLEX MICROSCOPIC
Glucose, UA: NEGATIVE mg/dL
Hgb urine dipstick: NEGATIVE
Ketones, ur: NEGATIVE mg/dL
Leukocytes,Ua: NEGATIVE
Nitrite: NEGATIVE
Protein, ur: NEGATIVE mg/dL
Specific Gravity, Urine: 1.015 (ref 1.005–1.030)
pH: 5.5 (ref 5.0–8.0)

## 2021-12-31 LAB — AMMONIA: Ammonia: 89 umol/L — ABNORMAL HIGH (ref 9–35)

## 2021-12-31 MED ORDER — SODIUM CHLORIDE 0.9 % IV SOLN
2.0000 g | INTRAVENOUS | Status: DC
  Administered 2021-12-31 – 2022-01-01 (×2): 2 g via INTRAVENOUS
  Filled 2021-12-31 (×2): qty 20

## 2021-12-31 MED ORDER — PROPRANOLOL HCL 10 MG PO TABS
10.0000 mg | ORAL_TABLET | Freq: Two times a day (BID) | ORAL | Status: DC
  Administered 2022-01-01 – 2022-01-02 (×3): 10 mg via ORAL
  Filled 2021-12-31 (×3): qty 1

## 2021-12-31 MED ORDER — LACTULOSE 10 GM/15ML PO SOLN
30.0000 g | Freq: Three times a day (TID) | ORAL | Status: DC
  Administered 2022-01-01 – 2022-01-02 (×3): 30 g via ORAL
  Filled 2021-12-31 (×5): qty 60

## 2021-12-31 MED ORDER — PANTOPRAZOLE SODIUM 40 MG PO TBEC
40.0000 mg | DELAYED_RELEASE_TABLET | Freq: Every day | ORAL | Status: DC
  Administered 2022-01-01 – 2022-01-02 (×2): 40 mg via ORAL
  Filled 2021-12-31 (×2): qty 1

## 2021-12-31 MED ORDER — PRAVASTATIN SODIUM 40 MG PO TABS
40.0000 mg | ORAL_TABLET | Freq: Every day | ORAL | Status: DC
Start: 2022-01-01 — End: 2022-01-02
  Administered 2022-01-01 – 2022-01-02 (×2): 40 mg via ORAL
  Filled 2021-12-31 (×2): qty 1

## 2021-12-31 MED ORDER — POTASSIUM CHLORIDE CRYS ER 20 MEQ PO TBCR
40.0000 meq | EXTENDED_RELEASE_TABLET | Freq: Once | ORAL | Status: AC
  Administered 2021-12-31: 40 meq via ORAL
  Filled 2021-12-31: qty 2

## 2021-12-31 MED ORDER — LACTULOSE 10 GM/15ML PO SOLN
30.0000 g | Freq: Once | ORAL | Status: AC
  Administered 2021-12-31: 30 g via ORAL
  Filled 2021-12-31: qty 60

## 2021-12-31 NOTE — Plan of Care (Signed)

## 2021-12-31 NOTE — ED Notes (Signed)
ED Provider at bedside. 

## 2021-12-31 NOTE — ED Triage Notes (Signed)
Pt arrived via POV with spouse. Per pt, he has felt "more confused" since hernia repair 3/7. Per spouse, pt has still been eating/drinking, caring for self needs but more confused than normal. Denies any pain  ?Able to tell name, situation, unable to tell year ?

## 2021-12-31 NOTE — H&P (Addendum)
?History and Physical  ? ? ?Peter Nichols:[id number] DOB: 10-29-1945 DOA: 12/31/2021 ? ?PCP: [name], MD  ?Patient coming from: Home. ? ?Chief Complaint: Confusion. ? ?HPI: Peter Nichols is a 76 y.o. male with history of liver cirrhosis, hypertension, hyperlipidemia who has had a recent right inguinal hernia repair by Dr. Hassell Done on December 23, 2021 about 10 days ago was brought to the ER after patient's wife found the patient was getting increasingly confused over the last few days.  Has been on the decline since the surgery per the patient's wife.  But has been able to eat walk around.  Confusion has worsened.  Has not missed a dose of lactulose.  Denies any fever chills.  Did not complain of any abdominal pain. ? ?ED Course: In the ER patient was appearing weak and slightly lethargic.  But able to answer questions including place person and time.  Labs show elevated ammonia levels of 89.  Platelets 105.  COVID test negative.  Potassium was 3.4.  Abdomen is mildly distended nontender.  Hernia site where patient had surgery appears to show no signs of infection.  CT head unremarkable.  Patient was given lactulose admitted for further observation. ? ?Review of Systems: As per HPI, rest all negative. ? ? ?Past Medical History:  ?Diagnosis Date  ? Cirrhosis ([place])   ? Dysphagia   ? GERD (gastroesophageal reflux disease)   ? Hyperlipidemia   ? Hypertension   ? Neuromuscular disorder ([place])   ? tremor  ? Steatohepatitis, non-alcoholic   ? Thrombocytopenia ([place])   ? ? ?Past Surgical History:  ?Procedure Laterality Date  ? CHOLECYSTECTOMY    ? ESOPHAGOGASTRODUODENOSCOPY (EGD) WITH PROPOFOL Bilateral 08/06/2021  ? Procedure: ESOPHAGOGASTRODUODENOSCOPY (EGD) WITH PROPOFOL;  Surgeon: Arta Silence, MD;  Location: [facility] ENDOSCOPY;  Service: Endoscopy;  Laterality: Bilateral;  ? INGUINAL HERNIA REPAIR Right 12/23/2021  ? Procedure: RIGHT INGUINAL HERNIA REPAIR- OPEN;  Surgeon: Johnathan Hausen, MD;  Location: [facility] ORS;   Service: General;  Laterality: Right;  ? IR PARACENTESIS  06/16/2021  ? IR PARACENTESIS  07/11/2021  ? STOMACH SURGERY    ? ? ? reports that he has never smoked. He has never used smokeless tobacco. He reports that he does not currently use alcohol. He reports that he does not use drugs. ? ?Allergies  ?Allergen Reactions  ? Other Nausea And Vomiting  ?  Ensure  ? ? ?Family History  ?Problem Relation Age of Onset  ? Cancer Mother   ? ? ?Prior to Admission medications   ?Medication Sig Start Date End Date Taking? Authorizing Provider  ?b complex vitamins capsule Take 1 capsule by mouth daily.   Yes [provider]  ?calcium carbonate (TUMS - DOSED IN MG ELEMENTAL CALCIUM) 500 MG chewable tablet Chew 1 tablet by mouth daily as needed for indigestion or heartburn.   Yes [provider]  ?furosemide (LASIX) 40 MG tablet Take 40 mg by mouth in the morning.   Yes [provider]  ?lactulose (CHRONULAC) 10 GM/15ML solution Take 30 mLs (20 g total) by mouth 3 (three) times daily. Increase accordingly up to 45 mLs (30 gms total) and/or up to 3 x day if having worsening confusion ?Patient taking differently: Take 20 g by mouth 3 (three) times daily. 06/03/21  Yes Elgergawy, Silver Huguenin, MD  ?Multiple Vitamin (MULTIVITAMIN WITH MINERALS) TABS tablet Take 1 tablet by mouth daily. 05/25/21  Yes Annita Brod, MD  ?pantoprazole (PROTONIX) 40 MG tablet Take 40 mg  by mouth daily.   Yes [provider]  ?potassium chloride (KLOR-CON M) 10 MEQ tablet Take 10 mEq by mouth every other day. 10/24/21  Yes [provider]  ?pravastatin (PRAVACHOL) 40 MG tablet Take 40 mg by mouth daily.  08/31/19  Yes [provider]  ?propranolol (INDERAL) 10 MG tablet Take 1 tablet (10 mg total) by mouth 2 (two) times daily. 06/03/21  Yes Elgergawy, Silver Huguenin, MD  ?spironolactone (ALDACTONE) 50 MG tablet Take 100 mg by mouth in the morning.   Yes [provider]  ?furosemide (LASIX) 20 MG tablet  Take 1 tablet (20 mg total) by mouth daily. 06/04/21   Elgergawy, Silver Huguenin, MD  ?oxyCODONE (OXY IR/ROXICODONE) 5 MG immediate release tablet Take 1 tablet (5 mg total) by mouth every 6 (six) hours as needed for severe pain. ?Patient not taking: Reported on 12/31/2021 12/23/21   Johnathan Hausen, MD  ?potassium chloride SA (KLOR-CON) 20 MEQ tablet Take 1 tablet (20 mEq total) by mouth every 3 (three) days. ?Patient not taking: Reported on 12/04/2021 06/03/21   Elgergawy, Silver Huguenin, MD  ? ? ?Physical Exam: ?Constitutional: Moderately built and nourished. ?Vitals:  ? 12/31/21 2100 12/31/21 2130 12/31/21 2200 12/31/21 2254  ?BP: 121/70 (!) 141/80 (!) 115/50 140/68  ?Pulse: 77 78 72 60  ?Resp: 19 18 (!) 22 20  ?Temp:    98.4 ?F (36.9 ?C)  ?TempSrc:    Oral  ?SpO2: 95% 90% 94% 96%  ?Weight:    80.5 kg  ?Height:    5' 9.5" (1.765 m)  ? ?Eyes: Anicteric no pallor. ?ENMT: No discharge from the ears eyes nose and mouth. ?Neck: No mass felt.  No neck rigidity. ?Respiratory: No rhonchi or crepitations.   ?Cardiovascular: S1-S2 heard. ?Abdomen: Soft nontender mildly distended and no guarding or rigidity.  Right inguinal hernia surgical site appears clear. ?Musculoskeletal: No edema. ?Skin: No rash. ?Neurologic: Mildly lethargic but oriented to place person and time.  Moving all extremities. ?Psychiatric: Oriented to time place and person. ? ? ?Labs on Admission: I have personally reviewed following labs and imaging studies ? ?CBC: ?Recent Labs  ?Lab 12/31/21 ?1617  ?WBC 6.3  ?NEUTROABS 4.5  ?HGB 13.7  ?HCT 39.3  ?MCV 99.2  ?PLT 105*  ? ?Basic Metabolic Panel: ?Recent Labs  ?Lab 12/31/21 ?1617  ?NA 136  ?K 3.4*  ?CL 106  ?CO2 24  ?GLUCOSE 182*  ?BUN 15  ?CREATININE 0.84  ?CALCIUM 8.7*  ? ?GFR: ?Estimated Creatinine Clearance: 78.5 mL/min (by C-G formula based on SCr of 0.84 mg/dL). ?Liver Function Tests: ?Recent Labs  ?Lab 12/31/21 ?1617  ?AST 43*  ?ALT 39  ?ALKPHOS 73  ?BILITOT 5.7*  ?PROT 5.9*  ?ALBUMIN 2.8*  ? ?No results for  input(s): LIPASE, AMYLASE in the last 168 hours. ?Recent Labs  ?Lab 12/31/21 ?1630  ?AMMONIA 89*  ? ?Coagulation Profile: ?No results for input(s): INR, PROTIME in the last 168 hours. ?Cardiac Enzymes: ?No results for input(s): CKTOTAL, CKMB, CKMBINDEX, TROPONINI in the last 168 hours. ?BNP (last 3 results) ?No results for input(s): PROBNP in the last 8760 hours. ?HbA1C: ?No results for input(s): HGBA1C in the last 72 hours. ?CBG: ?No results for input(s): GLUCAP in the last 168 hours. ?Lipid Profile: ?No results for input(s): CHOL, HDL, LDLCALC, TRIG, CHOLHDL, LDLDIRECT in the last 72 hours. ?Thyroid Function Tests: ?No results for input(s): TSH, T4TOTAL, FREET4, T3FREE, THYROIDAB in the last 72 hours. ?Anemia Panel: ?No results for input(s): VITAMINB12, FOLATE, FERRITIN, TIBC, IRON,  RETICCTPCT in the last 72 hours. ?Urine analysis: ?   ?Component Value Date/Time  ? [place] 12/31/2021 2017  ? APPEARANCEUR CLEAR 12/31/2021 2017  ? LABSPEC 1.015 12/31/2021 2017  ? PHURINE 5.5 12/31/2021 2017  ? GLUCOSEU NEGATIVE 12/31/2021 2017  ? [place] NEGATIVE 12/31/2021 2017  ? BILIRUBINUR SMALL (A) 12/31/2021 2017  ? [place] NEGATIVE 12/31/2021 2017  ? [place] NEGATIVE 12/31/2021 2017  ? UROBILINOGEN 1.0 11/27/2007 1029  ? NITRITE NEGATIVE 12/31/2021 2017  ? LEUKOCYTESUR NEGATIVE 12/31/2021 2017  ? ?Sepsis Labs: ?@[email or web address] (procalcitonin:4,lacticidven:4) ?) ?Recent Results (from the past 240 hour(s))  ?Resp Panel by RT-PCR (Flu A&B, Covid) Nasopharyngeal Swab     Status: None  ? Collection Time: 12/31/21  9:34 PM  ? Specimen: Nasopharyngeal Swab; Nasopharyngeal(NP) swabs in vial transport medium  ?Result Value Ref Range Status  ? SARS Coronavirus 2 by RT PCR NEGATIVE NEGATIVE Final  ?  Comment: (NOTE) ?SARS-CoV-2 target nucleic acids are NOT DETECTED. ? ?The SARS-CoV-2 RNA is generally detectable in upper respiratory ?specimens during the acute phase of infection. The lowest ?concentration of SARS-CoV-2 viral copies  this assay can detect is ?138 copies/mL. A negative result does not preclude SARS-Cov-2 ?infection and should not be used as the sole basis for treatment or ?other patient management decisions. A negative result ma

## 2021-12-31 NOTE — ED Notes (Signed)
Pt drinking PO fluids, pt aware UA needed.  ?

## 2022-01-01 ENCOUNTER — Observation Stay (HOSPITAL_COMMUNITY): Payer: Medicare HMO

## 2022-01-01 ENCOUNTER — Other Ambulatory Visit: Payer: Self-pay

## 2022-01-01 DIAGNOSIS — I10 Essential (primary) hypertension: Secondary | ICD-10-CM

## 2022-01-01 DIAGNOSIS — Z79899 Other long term (current) drug therapy: Secondary | ICD-10-CM | POA: Diagnosis not present

## 2022-01-01 DIAGNOSIS — K7581 Nonalcoholic steatohepatitis (NASH): Secondary | ICD-10-CM

## 2022-01-01 DIAGNOSIS — D638 Anemia in other chronic diseases classified elsewhere: Secondary | ICD-10-CM | POA: Diagnosis present

## 2022-01-01 DIAGNOSIS — E7800 Pure hypercholesterolemia, unspecified: Secondary | ICD-10-CM

## 2022-01-01 DIAGNOSIS — R188 Other ascites: Secondary | ICD-10-CM | POA: Diagnosis not present

## 2022-01-01 DIAGNOSIS — K7460 Unspecified cirrhosis of liver: Secondary | ICD-10-CM | POA: Diagnosis present

## 2022-01-01 DIAGNOSIS — K7682 Hepatic encephalopathy: Secondary | ICD-10-CM | POA: Diagnosis present

## 2022-01-01 DIAGNOSIS — E860 Dehydration: Secondary | ICD-10-CM | POA: Diagnosis present

## 2022-01-01 DIAGNOSIS — D649 Anemia, unspecified: Secondary | ICD-10-CM | POA: Diagnosis not present

## 2022-01-01 DIAGNOSIS — R739 Hyperglycemia, unspecified: Secondary | ICD-10-CM

## 2022-01-01 DIAGNOSIS — E785 Hyperlipidemia, unspecified: Secondary | ICD-10-CM | POA: Diagnosis present

## 2022-01-01 DIAGNOSIS — D696 Thrombocytopenia, unspecified: Secondary | ICD-10-CM

## 2022-01-01 DIAGNOSIS — Z20822 Contact with and (suspected) exposure to covid-19: Secondary | ICD-10-CM | POA: Diagnosis present

## 2022-01-01 DIAGNOSIS — D6959 Other secondary thrombocytopenia: Secondary | ICD-10-CM | POA: Diagnosis present

## 2022-01-01 DIAGNOSIS — K219 Gastro-esophageal reflux disease without esophagitis: Secondary | ICD-10-CM | POA: Diagnosis present

## 2022-01-01 LAB — BASIC METABOLIC PANEL
Anion gap: 8 (ref 5–15)
BUN: 14 mg/dL (ref 8–23)
CO2: 24 mmol/L (ref 22–32)
Calcium: 8.6 mg/dL — ABNORMAL LOW (ref 8.9–10.3)
Chloride: 105 mmol/L (ref 98–111)
Creatinine, Ser: 0.78 mg/dL (ref 0.61–1.24)
GFR, Estimated: >60 mL/min (ref >60)
Glucose, Bld: 159 mg/dL — ABNORMAL HIGH (ref 70–99)
Potassium: 3.6 mmol/L (ref 3.5–5.1)
Sodium: 137 mmol/L (ref 135–145)

## 2022-01-01 LAB — CBC WITH DIFFERENTIAL/PLATELET
Abs Immature Granulocytes: 0.04 10*3/uL (ref 0.00–0.07)
Basophils Absolute: 0 10*3/uL (ref 0.0–0.1)
Basophils Relative: 1 %
Eosinophils Absolute: 0.2 10*3/uL (ref 0.0–0.5)
Eosinophils Relative: 5 %
HCT: 35.1 % — ABNORMAL LOW (ref 39.0–52.0)
Hemoglobin: 12 g/dL — ABNORMAL LOW (ref 13.0–17.0)
Immature Granulocytes: 1 %
Lymphocytes Relative: 20 %
Lymphs Abs: 0.9 10*3/uL (ref 0.7–4.0)
MCH: 34.4 pg — ABNORMAL HIGH (ref 26.0–34.0)
MCHC: 34.2 g/dL (ref 30.0–36.0)
MCV: 100.6 fL — ABNORMAL HIGH (ref 80.0–100.0)
Monocytes Absolute: 0.6 10*3/uL (ref 0.1–1.0)
Monocytes Relative: 12 %
Neutro Abs: 2.8 10*3/uL (ref 1.7–7.7)
Neutrophils Relative %: 61 %
Platelets: 75 10*3/uL — ABNORMAL LOW (ref 150–400)
RBC: 3.49 MIL/uL — ABNORMAL LOW (ref 4.22–5.81)
RDW: 14.3 % (ref 11.5–15.5)
WBC: 4.5 10*3/uL (ref 4.0–10.5)
nRBC: 0 % (ref 0.0–0.2)

## 2022-01-01 LAB — BODY FLUID CELL COUNT WITH DIFFERENTIAL
Lymphs, Fluid: 54 %
Monocyte-Macrophage-Serous Fluid: 37 % — ABNORMAL LOW (ref 50–90)
Neutrophil Count, Fluid: 9 % (ref 0–25)
Total Nucleated Cell Count, Fluid: 230 cu mm (ref 0–1000)

## 2022-01-01 LAB — HEPATIC FUNCTION PANEL
ALT: 33 U/L (ref 0–44)
AST: 39 U/L (ref 15–41)
Albumin: 2.5 g/dL — ABNORMAL LOW (ref 3.5–5.0)
Alkaline Phosphatase: 55 U/L (ref 38–126)
Bilirubin, Direct: 1 mg/dL — ABNORMAL HIGH (ref 0.0–0.2)
Indirect Bilirubin: 3.7 mg/dL — ABNORMAL HIGH (ref 0.3–0.9)
Total Bilirubin: 4.7 mg/dL — ABNORMAL HIGH (ref 0.3–1.2)
Total Protein: 5.1 g/dL — ABNORMAL LOW (ref 6.5–8.1)

## 2022-01-01 LAB — AMMONIA
Ammonia: 83 umol/L — ABNORMAL HIGH (ref 9–35)
Ammonia: 52 umol/L — ABNORMAL HIGH (ref 9–35)

## 2022-01-01 LAB — PROTEIN, PLEURAL OR PERITONEAL FLUID: Total protein, fluid: <3 g/dL

## 2022-01-01 MED ORDER — RIFAXIMIN 550 MG PO TABS
550.0000 mg | ORAL_TABLET | Freq: Two times a day (BID) | ORAL | Status: DC
  Administered 2022-01-01 – 2022-01-02 (×3): 550 mg via ORAL
  Filled 2022-01-01 (×3): qty 1

## 2022-01-01 MED ORDER — ENSURE ENLIVE PO LIQD: 237.0000 mL | ORAL | Status: DC

## 2022-01-01 MED ORDER — PROSOURCE PLUS PO LIQD
30.0000 mL | Freq: Two times a day (BID) | ORAL | Status: DC
  Administered 2022-01-01 – 2022-01-02 (×2): 30 mL via ORAL
  Filled 2022-01-01 (×2): qty 30

## 2022-01-01 MED ORDER — LIDOCAINE HCL 1 % IJ SOLN
INTRAMUSCULAR | Status: AC
  Administered 2022-01-01: 10 mL
  Filled 2022-01-01: qty 20

## 2022-01-01 MED ORDER — ADULT MULTIVITAMIN W/MINERALS CH
1.0000 | ORAL_TABLET | Freq: Every day | ORAL | Status: DC
  Administered 2022-01-01 – 2022-01-02 (×2): 1 via ORAL
  Filled 2022-01-01 (×2): qty 1

## 2022-01-01 NOTE — Progress Notes (Signed)
Initial Nutrition Assessment ? ?DOCUMENTATION CODES:  ? ?Not applicable ? ?INTERVENTION:  ?- will order Ensure Plus High Protein once/day, each supplement provides 350 kcal and 20 grams of protein. ?- will order 30 ml Prosource Plus BID, each supplement provides 100 kcal and 15 grams protein.  ?- will order 1 tablet multivitamin with minerals/day. ?- complete NFPE when feasible.  ? ? ?NUTRITION DIAGNOSIS:  ? ?Increased nutrient needs related to acute illness as evidenced by estimated needs. ? ?GOAL:  ? ?Patient will meet greater than or equal to 90% of their needs ? ?MONITOR:  ? ?PO intake, Supplement acceptance, Labs, Weight trends ? ?REASON FOR ASSESSMENT:  ? ?Malnutrition Screening Tool ? ?ASSESSMENT:  ? ?76 y.o. male with medical history of liver cirrhosis, HTN, neuromuscular disorder, dysphagia, steatohepatitis, GERD, and HLD. He was dx with a R inguinal hernia and underwent repair on 12/23/21.  His wife reported he has be walking around and eating at baseline but that he has had increased confusion despite taking lactulose as prescribed after hernia repair. He was admitted with dx of hepatic encephalopathy. ? ?Unable to see patient at this time. He is noted to be a/o x3-4 as of last night; no documentation on this today. Patient is Observation status.  ? ?He was seen by a [place] RD in 05/2021. ? ?He ate 100% of breakfast this AM (510 kcal and 16 grams protein). ? ?Weight yesterday was 177 lb and weight on 12/10/21 was 179 lb. This indicates 2 lb weight loss (1% body weight) in the past 3 weeks.  ? ?No information documented in the edema section of flow sheet.  ? ? ?Labs reviewed; Ca: 8.6 mg/dl, ammonia: 83 umol/l and down from 3/15 ? ?Medications reviewed; 30 g lactulose TID, 40 mg oral protonix/day, 40 mEq Klor-Con x1 dose 3/15. ?  ? ?NUTRITION - FOCUSED PHYSICAL EXAM: ? ?Unable to complete at this time. ? ?Diet Order:   ?Diet Order   ? ?       ?  Diet 2 gram sodium Room service appropriate? Yes; Fluid  consistency: Thin; Fluid restriction: 1200 mL Fluid  Diet effective now       ?  ? ?  ?  ? ?  ? ? ?EDUCATION NEEDS:  ? ?Not appropriate for education at this time ? ?Skin:  Skin Assessment: Reviewed RN Assessment ? ?Last BM:  PTA/unknown ? ?Height:  ? ?Ht Readings from Last 1 Encounters:  ?12/31/21 5' 9.5" (1.765 m)  ? ? ?Weight:  ? ?Wt Readings from Last 1 Encounters:  ?12/31/21 80.5 kg  ? ? ? ?BMI:  Body mass index is 25.83 kg/m?. ? ?Estimated Nutritional Needs:  ?Kcal:  1750-1950 kcal ?Protein:  80-95 grams ?Fluid:  >/= 1.5 L/day ? ? ? ? ? ?Jarome Matin, MS, RD, LDN ?Registered Dietitian II ?Inpatient Clinical Nutrition ?RD pager # and on-call/weekend pager # available in [place]  ? ?

## 2022-01-01 NOTE — Progress Notes (Signed)
?PROGRESS NOTE ? ? ? ?Peter Nichols  JAS:[id number] DOB: [birth year] DOA: 12/31/2021 ?PCP: [name], MD  ? ? ? ?Brief Narrative:  ?76 y.o. WM PMHx liver cirrhosis, thrombocytopenia, neuromuscular disorder, hypertension, hyperlipidemia who has had a recent right inguinal hernia repair by Dr. Hassell Done on December 23, 2021 about 10 days ago  ? ?Brought to the ER after patient's wife found the patient was getting increasingly confused over the last few days.  Has been on the decline since the surgery per the patient's wife.  But has been able to eat walk around.  Confusion has worsened.  Has not missed a dose of lactulose.  Denies any fever chills.  Did not complain of any abdominal pain. ?  ?ED Course: In the ER patient was appearing weak and slightly lethargic.  But able to answer questions including place person and time.  Labs show elevated ammonia levels of 89.  Platelets 105.  COVID test negative.  Potassium was 3.4.  Abdomen is mildly distended nontender.  Hernia site where patient had surgery appears to show no signs of infection.  CT head unremarkable.  Patient was given lactulose admitted for further observation. ? ?Subjective: ?A/O x3, ? ? ?Assessment & Plan: ?Covid vaccination; ?  ?Principal Problem: ?  Hepatic encephalopathy ?Active Problems: ?  Thrombocytopenia ([place]) ?  Acute hepatic encephalopathy ?  Steatohepatitis, non-alcoholic ?  Hyperlipidemia ?  Hypertension ?  Ascites of liver ?  Anemia, unspecified ?  Hyperglycemia ? ?Liver cirrhosis/Recurrent ascites ?-3/16 s/p Paracentesis: 3.5 L aspirated sample sent to laboratory ?-Continue Ceftriaxone empirically for suspected SBP.  Will await cultures. ?-Propranolol 10 mg BID ?-Hold furosemide and spironolactone ? ? ?Hepatic encephalopathy/elevated Ammonia ?-Trend ammonia ?Lab Results  ?Component Value Date  ? AMMONIA 52 (H) 01/01/2022  ? AMMONIA 83 (H) 01/01/2022  ? AMMONIA 89 (H) 12/31/2021  ? AMMONIA 82 (H) 11/07/2021  ? AMMONIA 29 06/02/2021  ?-Lactulose  30 g TID ?-Rifaximin 550 mg BID ? ? 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 ?HTN ?-See liver cirrhosis ?-Controlled ? ?HLD ?-3/16 lipid panel pending ?-Pravastatin 40 mg daily ? ?Anemia unspecified ?-3/16 anemia panel pending ? ?Hyperglycemia ?- Resolved ? ? ?  ?Mobility Assessment (last 72 hours)   ? ? Mobility Assessment   ? ? [place] Name 01/01/22 1025 12/31/21 2314  ?  ?  ?  ? Does patient have an order for bedrest or is patient medically unstable No - Continue assessment No - Continue assessment     ? What is the highest level of mobility based on the progressive mobility assessment? Level 5 (Walks with assist in room/hall) - Balance while stepping forward/back and can walk in room with assist - Complete Level 5 (Walks with assist in room/hall) - Balance while stepping forward/back and can walk in room with assist - Complete     ? ?  ?  ? ?  ? ? ?DVT prophylaxis: Heparin ?Code Status: Full ?Family Communication:  ?Status is: Inpatient ? ? ? ?Dispo: The patient is from: Home ?             Anticipated d/c is to: Home ?             Anticipated d/c date is: 2 days ?             Patient currently is not medically stable to d/c. ? ? ? ? ? ?Consultants:  ? ? ?Procedures/Significant Events:  ?3/16 s/p Paracentesis: 3.5 L aspirated ? ?I have personally reviewed and interpreted  all radiology studies and my findings are as above. ? ?VENTILATOR SETTINGS: ? ? ? ?Cultures ?3/16 peritoneal fluid ? ? ?Antimicrobials: ?Anti-infectives (From admission, onward)  ? ? Start     Dose/Rate Route Frequency Ordered Stop  ? 01/01/22 0445  rifaximin (XIFAXAN) tablet 550 mg       ? 550 mg Oral 2 times daily 01/01/22 0356    ? 01/01/22 0000  cefTRIAXone (ROCEPHIN) 2 g in sodium chloride 0.9 % 100 mL IVPB       ? 2 g ?200 mL/hr over 30 Minutes Intravenous Every 24 hours 12/31/21 2312    ? ?  ?  ? ? ?Devices ?  ? ?LINES / TUBES:  ? ? ? ? ?Continuous Infusions: ? cefTRIAXone (ROCEPHIN)  IV 2 g (12/31/21 2347)  ? ? ? ?Objective: ?Vitals:  ? 01/01/22 1418 01/01/22  1438 01/01/22 1443 01/01/22 2024  ?BP: 137/66 120/65 129/60 (!) 142/60  ?Pulse:    73  ?Resp:    18  ?Temp:    98.7 ?F (37.1 ?C)  ?TempSrc:    Oral  ?SpO2:    96%  ?Weight:      ?Height:      ? ? ?Intake/Output Summary (Last 24 hours) at 01/01/2022 2028 ?Last data filed at 01/01/2022 1040 ?Gross per 24 hour  ?Intake 960 ml  ?Output 300 ml  ?Net 660 ml  ? ?Filed Weights  ? 12/31/21 2254  ?Weight: 80.5 kg  ? ? ?Examination: ? ?General: A/O x3 (does not know when), No acute respiratory distress ?Eyes: negative scleral hemorrhage, negative anisocoria, negative icterus ?ENT: Negative Runny nose, negative gingival bleeding, ?Neck:  Negative scars, masses, torticollis, lymphadenopathy, JVD ?Lungs: Clear to auscultation bilaterally without wheezes or crackles ?Cardiovascular: Regular rate and rhythm without murmur gallop or rub normal S1 and S2 ?Abdomen: negative abdominal pain, positive distention, positive soft, bowel sounds, no rebound, no ascites, no appreciable mass ?Extremities: No significant cyanosis, clubbing, or edema bilateral lower extremities ?Skin: Negative rashes, lesions, ulcers, mild jaundice ?Psychiatric:  Negative depression, negative anxiety, negative fatigue, negative mania  ?Central nervous system:  Cranial nerves II through XII intact, tongue/uvula midline, all extremities muscle strength 5/5, sensation intact throughout, negative dysarthria, negative expressive aphasia, negative receptive aphasia. ? ?.  ? ? ? ?Data Reviewed: Care during the described time interval was provided by me .  I have reviewed this patient's available data, including medical history, events of note, physical examination, and all test results as part of my evaluation. ? ?CBC: ?Recent Labs  ?Lab 12/31/21 ?1617 01/01/22 ?0351  ?WBC 6.3 4.5  ?NEUTROABS 4.5 2.8  ?HGB 13.7 12.0*  ?HCT 39.3 35.1*  ?MCV 99.2 100.6*  ?PLT 105* 75*  ? ?Basic Metabolic Panel: ?Recent Labs  ?Lab 12/31/21 ?1617 01/01/22 ?0351  ?NA 136 137  ?K 3.4* 3.6  ?CL  106 105  ?CO2 24 24  ?GLUCOSE 182* 159*  ?BUN 15 14  ?CREATININE 0.84 0.78  ?CALCIUM 8.7* 8.6*  ? ?GFR: ?Estimated Creatinine Clearance: 82.4 mL/min (by C-G formula based on SCr of 0.78 mg/dL). ?Liver Function Tests: ?Recent Labs  ?Lab 12/31/21 ?1617 01/01/22 ?0351  ?AST 43* 39  ?ALT 39 33  ?ALKPHOS 73 55  ?BILITOT 5.7* 4.7*  ?PROT 5.9* 5.1*  ?ALBUMIN 2.8* 2.5*  ? ?No results for input(s): LIPASE, AMYLASE in the last 168 hours. ?Recent Labs  ?Lab 12/31/21 ?1630 01/01/22 ?0608 01/01/22 ?1505  ?AMMONIA 89* 83* 52*  ? ?Coagulation Profile: ?No results for input(s): INR, PROTIME in  the last 168 hours. ?Cardiac Enzymes: ?No results for input(s): CKTOTAL, CKMB, CKMBINDEX, TROPONINI in the last 168 hours. ?BNP (last 3 results) ?No results for input(s): PROBNP in the last 8760 hours. ?HbA1C: ?No results for input(s): HGBA1C in the last 72 hours. ?CBG: ?No results for input(s): GLUCAP in the last 168 hours. ?Lipid Profile: ?No results for input(s): CHOL, HDL, LDLCALC, TRIG, CHOLHDL, LDLDIRECT in the last 72 hours. ?Thyroid Function Tests: ?No results for input(s): TSH, T4TOTAL, FREET4, T3FREE, THYROIDAB in the last 72 hours. ?Anemia Panel: ?No results for input(s): VITAMINB12, FOLATE, FERRITIN, TIBC, IRON, RETICCTPCT in the last 72 hours. ?Sepsis Labs: ?No results for input(s): PROCALCITON, LATICACIDVEN in the last 168 hours. ? ?Recent Results (from the past 240 hour(s))  ?Resp Panel by RT-PCR (Flu A&B, Covid) Nasopharyngeal Swab     Status: None  ? Collection Time: 12/31/21  9:34 PM  ? Specimen: Nasopharyngeal Swab; Nasopharyngeal(NP) swabs in vial transport medium  ?Result Value Ref Range Status  ? SARS Coronavirus 2 by RT PCR NEGATIVE NEGATIVE Final  ?  Comment: (NOTE) ?SARS-CoV-2 target nucleic acids are NOT DETECTED. ? ?The SARS-CoV-2 RNA is generally detectable in upper respiratory ?specimens during the acute phase of infection. The lowest ?concentration of SARS-CoV-2 viral copies this assay can detect is ?138  copies/mL. A negative result does not preclude SARS-Cov-2 ?infection and should not be used as the sole basis for treatment or ?other patient management decisions. A negative result may occur with  ?improper s

## 2022-01-01 NOTE — TOC Initial Note (Signed)
Transition of Care (TOC) - Initial/Assessment Note  ? ? ?Patient Details  ?Name: Peter Nichols ?MRN: [id number] ?Date of Birth: 1946-03-25 ? ?Transition of Care (TOC) CM/SW Contact:    ?Tawanna Cooler, RN ?Phone Number: ?01/01/2022, 8:31 AM ? ?Clinical Narrative:                 ? ? ? ?Transition of Care (TOC) Screening Note ? ? ?Patient Details  ?Name: Peter Nichols ?Date of Birth: 05/07/1946 ? ? ?Transition of Care (TOC) CM/SW Contact:    ?Tawanna Cooler, RN ?Phone Number: ?01/01/2022, 8:31 AM ? ? ? ?Transition of Care Department [facility]) has reviewed patient and no TOC needs have been identified at this time. We will continue to monitor patient advancement through interdisciplinary progression rounds. If new patient transition needs arise, please place a TOC consult. ?  ? ?Expected Discharge Plan: Home/Self Care ?Barriers to Discharge: Continued Medical Work up ? ? ? ?Expected Discharge Plan and Services ?Expected Discharge Plan: Home/Self Care ?  ?  ?  ?Living arrangements for the past 2 months: Single Family Home ?                ?  ? ?Prior Living Arrangements/Services ?Living arrangements for the past 2 months: [place] ?Lives with:: Spouse ?Patient language and need for interpreter reviewed:: Yes ?       ?Need for Family Participation in Patient Care: Yes (Comment) ?Care giver support system in place?: Yes (comment) ?  ?Criminal Activity/Legal Involvement Pertinent to Current Situation/Hospitalization: No - Comment as needed ? ?Activities of Daily Living ?Home Assistive Devices/Equipment: Eyeglasses ?ADL Screening (condition at time of admission) ?Patient's cognitive ability adequate to safely complete daily activities?: Yes (able to do his ADLs but per wife seems more confused) ?Is the patient deaf or have difficulty hearing?: No ?Does the patient have difficulty seeing, even when wearing glasses/contacts?: No ?Does the patient have difficulty concentrating, remembering, or making decisions?:  Yes ?Patient able to express need for assistance with ADLs?: Yes ?Does the patient have difficulty dressing or bathing?: No ?Independently performs ADLs?: Yes (appropriate for developmental age) ?Does the patient have difficulty walking or climbing stairs?: No ?Weakness of Legs: None ?Weakness of Arms/Hands: None ? ? ?  ?Alcohol / Substance Use: Not Applicable ?Psych Involvement: No (comment) ? ?Admission diagnosis:  Hepatic encephalopathy [K76.82] ?Patient Active Problem List  ? Diagnosis Date Noted  ? Hepatic encephalopathy 12/31/2021  ? Pancreatitis, acute 06/01/2021  ? AKI (acute kidney injury) ([place]) 05/27/2021  ? Near syncope 05/27/2021  ? Steatohepatitis, non-alcoholic   ? Hyperlipidemia   ? Hypertension   ? Overweight (BMI 25.0-29.9)   ? Alcoholic cirrhosis of liver with ascites ([place])   ? Acute hepatic encephalopathy 05/22/2021  ? Thrombocytopenia ([place]) 05/04/2017  ? ?PCP:  [name], MD ?Pharmacy:   ?[place], [place] ?[place] ?[place] [address] ?Phone: [phone] Fax: [phone] ? ?[id number] [place]?Phone: [phone]: [phone]? ? ?Readmission Risk Interventions ?No flowsheet data found. ? ? ?

## 2022-01-02 DIAGNOSIS — R188 Other ascites: Secondary | ICD-10-CM | POA: Diagnosis not present

## 2022-01-02 DIAGNOSIS — D638 Anemia in other chronic diseases classified elsewhere: Secondary | ICD-10-CM

## 2022-01-02 DIAGNOSIS — R739 Hyperglycemia, unspecified: Secondary | ICD-10-CM | POA: Diagnosis not present

## 2022-01-02 DIAGNOSIS — K7682 Hepatic encephalopathy: Secondary | ICD-10-CM | POA: Diagnosis not present

## 2022-01-02 LAB — CBC WITH DIFFERENTIAL/PLATELET
Abs Immature Granulocytes: 0.04 10*3/uL (ref 0.00–0.07)
Basophils Absolute: 0 10*3/uL (ref 0.0–0.1)
Basophils Relative: 1 %
Eosinophils Absolute: 0.3 10*3/uL (ref 0.0–0.5)
Eosinophils Relative: 5 %
HCT: 35.7 % — ABNORMAL LOW (ref 39.0–52.0)
Hemoglobin: 12 g/dL — ABNORMAL LOW (ref 13.0–17.0)
Immature Granulocytes: 1 %
Lymphocytes Relative: 19 %
Lymphs Abs: 0.9 10*3/uL (ref 0.7–4.0)
MCH: 34.5 pg — ABNORMAL HIGH (ref 26.0–34.0)
MCHC: 33.6 g/dL (ref 30.0–36.0)
MCV: 102.6 fL — ABNORMAL HIGH (ref 80.0–100.0)
Monocytes Absolute: 0.6 10*3/uL (ref 0.1–1.0)
Monocytes Relative: 13 %
Neutro Abs: 2.9 10*3/uL (ref 1.7–7.7)
Neutrophils Relative %: 61 %
Platelets: 76 10*3/uL — ABNORMAL LOW (ref 150–400)
RBC: 3.48 MIL/uL — ABNORMAL LOW (ref 4.22–5.81)
RDW: 14.2 % (ref 11.5–15.5)
WBC: 4.7 10*3/uL (ref 4.0–10.5)
nRBC: 0 % (ref 0.0–0.2)

## 2022-01-02 LAB — RETICULOCYTES
Immature Retic Fract: 9 % (ref 2.3–15.9)
RBC.: 3.46 MIL/uL — ABNORMAL LOW (ref 4.22–5.81)
Retic Count, Absolute: 83.7 10*3/uL (ref 19.0–186.0)
Retic Ct Pct: 2.4 % (ref 0.4–3.1)

## 2022-01-02 LAB — FERRITIN: Ferritin: 343 ng/mL — ABNORMAL HIGH (ref 24–336)

## 2022-01-02 LAB — COMPREHENSIVE METABOLIC PANEL
ALT: 31 U/L (ref 0–44)
AST: 34 U/L (ref 15–41)
Albumin: 2.5 g/dL — ABNORMAL LOW (ref 3.5–5.0)
Alkaline Phosphatase: 55 U/L (ref 38–126)
Anion gap: 8 (ref 5–15)
BUN: 15 mg/dL (ref 8–23)
CO2: 24 mmol/L (ref 22–32)
Calcium: 8.4 mg/dL — ABNORMAL LOW (ref 8.9–10.3)
Chloride: 104 mmol/L (ref 98–111)
Creatinine, Ser: 0.73 mg/dL (ref 0.61–1.24)
GFR, Estimated: >60 mL/min (ref >60)
Glucose, Bld: 119 mg/dL — ABNORMAL HIGH (ref 70–99)
Potassium: 3.8 mmol/L (ref 3.5–5.1)
Sodium: 136 mmol/L (ref 135–145)
Total Bilirubin: 5.5 mg/dL — ABNORMAL HIGH (ref 0.3–1.2)
Total Protein: 5.1 g/dL — ABNORMAL LOW (ref 6.5–8.1)

## 2022-01-02 LAB — FOLATE: Folate: 15.4 ng/mL (ref >5.9)

## 2022-01-02 LAB — LIPID PANEL
Cholesterol: 173 mg/dL (ref 0–200)
HDL: 44 mg/dL (ref >40)
LDL Cholesterol: 119 mg/dL — ABNORMAL HIGH (ref 0–99)
Total CHOL/HDL Ratio: 3.9 RATIO
Triglycerides: 49 mg/dL (ref <150)
VLDL: 10 mg/dL (ref 0–40)

## 2022-01-02 LAB — PHOSPHORUS: Phosphorus: 3.4 mg/dL (ref 2.5–4.6)

## 2022-01-02 LAB — IRON AND TIBC
Iron: 150 ug/dL (ref 45–182)
Saturation Ratios: 82 % — ABNORMAL HIGH (ref 17.9–39.5)
TIBC: 182 ug/dL — ABNORMAL LOW (ref 250–450)
UIBC: 32 ug/dL

## 2022-01-02 LAB — PATHOLOGIST SMEAR REVIEW

## 2022-01-02 LAB — VITAMIN B12: Vitamin B-12: 1190 pg/mL — ABNORMAL HIGH (ref 180–914)

## 2022-01-02 LAB — MAGNESIUM: Magnesium: 1.7 mg/dL (ref 1.7–2.4)

## 2022-01-02 MED ORDER — RIFAXIMIN 550 MG PO TABS: 550.0000 mg | ORAL_TABLET | Freq: Two times a day (BID) | ORAL | 0 refills | Status: AC

## 2022-01-02 MED ORDER — PROSOURCE PLUS PO LIQD: 30.0000 mL | Freq: Two times a day (BID) | ORAL | 0 refills | Status: AC

## 2022-01-02 MED ORDER — ENSURE ENLIVE PO LIQD: 237.0000 mL | ORAL | 0 refills | Status: AC

## 2022-01-02 MED ORDER — PRAVASTATIN SODIUM 40 MG PO TABS: 60.0000 mg | ORAL_TABLET | Freq: Every day | ORAL | Status: DC

## 2022-01-02 MED ORDER — LACTULOSE 10 GM/15ML PO SOLN: 30.0000 g | Freq: Three times a day (TID) | ORAL | 0 refills | Status: AC

## 2022-01-02 MED ORDER — PRAVASTATIN SODIUM 20 MG PO TABS: 60.0000 mg | ORAL_TABLET | Freq: Every day | ORAL | 0 refills | Status: AC

## 2022-01-02 NOTE — Discharge Summary (Signed)
Physician Discharge Summary  ?Peter Nichols:[id number] DOB: [birth year] DOA: 12/31/2021 ? ?PCP: Peter Stains, MD ? ?Admit date: 12/31/2021 ?Discharge date: 01/02/2022 ? ?Time spent: 35 minutes ? ?Recommendations for Outpatient Follow-up:  ? ?Liver cirrhosis/Recurrent ascites ?-3/16 s/p Paracentesis: 3.5 L aspirated sample sent to laboratory ?-Propranolol 10 mg BID ?-Hold furosemide and spironolactone.  Counseled wife and patient that would allow Dr. Paulita Nichols to determine when/if to restart.  Patient just S/P paracentesis and his BP within normal limits. ?- Instructed patient to call  Dr. Paulita Nichols gastroenterology and schedule follow-up within 1 week liver cirrhosis/hepatic encephalopathy/ascites.  Medication adjustment and lab work. ?- Patient afebrile, negative leukocytosis, negative abdominal pain doubt patient has SBP will DC antibiotics prior to discharge  ?-Fluid restriction 1.2 L/day ?  ?Hepatic encephalopathy/elevated Ammonia ?-Trend ammonia ?Lab Results  ?Component Value Date  ? AMMONIA 52 (H) 01/01/2022  ? AMMONIA 83 (H) 01/01/2022  ? AMMONIA 89 (H) 12/31/2021  ? AMMONIA 82 (H) 11/07/2021  ? AMMONIA 29 06/02/2021  ?-Discharged on Lactulose 30 g TID ?-Discharged on Rifaximin 550 mg BID ? 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 ?HTN ?-See liver cirrhosis ?-Controlled ? ?HLD ?-3/17 LDL= 119, goal LDL<70 ?- Increase Pravastatin 60 mg daily ?  ?Anemia of chronic disease ?-3/16 anemia panel, consistent with anemia of chronic disease.  ?  ?Hyperglycemia ?- Resolved ? ? ? ?Discharge Diagnoses:  ?Principal Problem: ?  Hepatic encephalopathy ?Active Problems: ?  Thrombocytopenia ([place]) ?  Acute hepatic encephalopathy ?  Steatohepatitis, non-alcoholic ?  Hyperlipidemia ?  Hypertension ?  Ascites of liver ?  Anemia, unspecified ?  Hyperglycemia ? ? ?Discharge Condition: Stable ? ?Diet recommendation: Low-sodium diet ? ?Filed Weights  ? 12/31/21 2254  ?Weight: 80.5 kg  ? ? ?History of present illness:  ?76 y.o. WM PMHx liver  cirrhosis, thrombocytopenia, neuromuscular disorder, hypertension, hyperlipidemia who has had a recent right inguinal hernia repair by Dr. Hassell Done on December 23, 2021 about 10 days ago  ?  ?Brought to the ER after patient's wife found the patient was getting increasingly confused over the last few days.  Has been on the decline since the surgery per the patient's wife.  But has been able to eat walk around.  Confusion has worsened.  Has not missed a dose of lactulose.  Denies any fever chills.  Did not complain of any abdominal pain. ?  ?ED Course: In the ER patient was appearing weak and slightly lethargic.  But able to answer questions including place person and time.  Labs show elevated ammonia levels of 89.  Platelets 105.  COVID test negative.  Potassium was 3.4.  Abdomen is mildly distended nontender.  Hernia site where patient had surgery appears to show no signs of infection.  CT head unremarkable.  Patient was given lactulose admitted for further observation. ? ?Hospital Course:  ?See above ? ? ?Procedures: ?3/16 s/p Paracentesis: 3.5 L aspirated ? ? ?Consultations: ?IR ? ?Cultures  ?3/15 influenza A/B negative ?3/15 SARS coronavirus negative ? ? ?Antibiotics ?Anti-infectives (From admission, onward)  ? ? Start     Ordered Stop  ? 01/01/22 0445  rifaximin (XIFAXAN) tablet 550 mg       ? 01/01/22 0356    ? 01/01/22 0000  cefTRIAXone (ROCEPHIN) 2 g in sodium chloride 0.9 % 100 mL IVPB  Status:  Discontinued       ? 12/31/21 2312 01/02/22 1126  ? ?  ?  ? ? ?Discharge Exam: ?Vitals:  ? 01/01/22 1418 01/01/22 1438  01/01/22 1443 01/01/22 2024  ?BP: 137/66 120/65 129/60 (!) 142/60  ?Pulse:    73  ?Resp:    18  ?Temp:    98.7 ?F (37.1 ?C)  ?TempSrc:    Oral  ?SpO2:    96%  ?Weight:      ?Height:      ? ? ?General: A/O x4, No acute respiratory distress ?Eyes: negative scleral hemorrhage, negative anisocoria, negative icterus ?ENT: Negative Runny nose, negative gingival bleeding, ?Neck:  Negative scars, masses,  torticollis, lymphadenopathy, JVD ?Lungs: Clear to auscultation bilaterally without wheezes or crackles ?Cardiovascular: Regular rate and rhythm without murmur gallop or rub normal S1 and S2 ?Abdomen: negative abdominal pain, positive distention, positive soft, bowel sounds, no rebound, no ascites, no appreciable mass ? ?Discharge Instructions ? ? ?Allergies as of 01/02/2022   ? ?   Reactions  ? Other Nausea And Vomiting  ? Ensure  ? ?  ? ?  ?Medication List  ?  ? ?STOP taking these medications   ? ?furosemide 20 MG tablet ?Commonly known as: LASIX ?  ?furosemide 40 MG tablet ?Commonly known as: LASIX ?  ?oxyCODONE 5 MG immediate release tablet ?Commonly known as: Oxy IR/ROXICODONE ?  ?potassium chloride 10 MEQ tablet ?Commonly known as: KLOR-CON M ?  ?potassium chloride SA 20 MEQ tablet ?Commonly known as: KLOR-CON M ?  ?spironolactone 50 MG tablet ?Commonly known as: ALDACTONE ?  ? ?  ? ?TAKE these medications   ? ?(feeding supplement) PROSource Plus liquid ?Take 30 mLs by mouth 2 (two) times daily between meals. ?  ?feeding supplement Liqd ?Take 237 mLs by mouth daily. ?  ?b complex vitamins capsule ?Take 1 capsule by mouth daily. ?  ?calcium carbonate 500 MG chewable tablet ?Commonly known as: TUMS - dosed in mg elemental calcium ?Chew 1 tablet by mouth daily as needed for indigestion or heartburn. ?  ?lactulose 10 GM/15ML solution ?Commonly known as: [place] ?Take 45 mLs (30 g total) by mouth 3 (three) times daily. ?What changed:  ?how much to take ?additional instructions ?  ?multivitamin with minerals Tabs tablet ?Take 1 tablet by mouth daily. ?  ?pantoprazole 40 MG tablet ?Commonly known as: PROTONIX ?Take 40 mg by mouth daily. ?  ?pravastatin 20 MG tablet ?Commonly known as: PRAVACHOL ?Take 3 tablets (60 mg total) by mouth daily. ?Start taking on: January 03, 2022 ?What changed:  ?medication strength ?how much to take ?  ?propranolol 10 MG tablet ?Commonly known as: INDERAL ?Take 1 tablet (10 mg total) by  mouth 2 (two) times daily. ?  ?rifaximin 550 MG Tabs tablet ?Commonly known as: XIFAXAN ?Take 1 tablet (550 mg total) by mouth 2 (two) times daily. ?  ? ?  ? ?Allergies  ?Allergen Reactions  ? Other Nausea And Vomiting  ?  Ensure  ? ? Follow-up Information   ? ? Arta Silence, MD. Schedule an appointment as soon as possible for a visit in 1 week(s).   ?Specialty: Gastroenterology ?Why: Call and schedule follow-up within 1 week liver cirrhosis/hepatic encephalopathy/ascites.  Medication adjustment and lab work. ?Contact information: ?[address] ?[place] [address] ?[phone] ? ? ?  ?  ? ?  ?  ? ?  ? ? ? ?The results of significant diagnostics from this hospitalization (including imaging, microbiology, ancillary and laboratory) are listed below for reference.   ? ?Significant Diagnostic Studies: ?CT Head Wo Contrast ? ?Result Date: 12/31/2021 ?CLINICAL DATA:  Mental status change, unknown cause EXAM: CT  HEAD WITHOUT CONTRAST TECHNIQUE: Contiguous axial images were obtained from the base of the skull through the vertex without intravenous contrast. RADIATION DOSE REDUCTION: This exam was performed according to the departmental dose-optimization program which includes automated exposure control, adjustment of the mA and/or kV according to patient size and/or use of iterative reconstruction technique. COMPARISON:  05/22/2021 FINDINGS: Brain: There is atrophy and chronic small vessel disease changes. No acute intracranial abnormality. Specifically, no hemorrhage, hydrocephalus, mass lesion, acute infarction, or significant intracranial injury. Vascular: No hyperdense vessel or unexpected calcification. Skull: No acute calvarial abnormality. Sinuses/Orbits: No acute findings Other: None IMPRESSION: Atrophy, chronic microvascular disease. No acute intracranial abnormality. Electronically Signed   By: Rolm Baptise M.D.   On: 12/31/2021 17:57  ? ?US Paracentesis ? ?Result Date: 01/01/2022 ?INDICATION:  Patient with history of cirrhosis and recurrent ascites. Request for diagnostic and therapeutic paracentesis. EXAM: ULTRASOUND GUIDED RIGHT LOWER QUADRANT PARACENTESIS MEDICATIONS: 1% plain lidocaine, 3 mL COMPL

## 2022-01-02 NOTE — Plan of Care (Signed)
  Problem: Education: Goal: Knowledge of General Education information will improve Description Including pain rating scale, medication(s)/side effects and non-pharmacologic comfort measures Outcome: Progressing   Problem: Health Behavior/Discharge Planning: Goal: Ability to manage health-related needs will improve Outcome: Progressing   

## 2022-01-02 NOTE — Progress Notes (Signed)
The patient is alert and oriented and has been seen by his physician. The orders for discharge were written. IV has been removed. Went over discharge instructions with patient and family. He will be discharged via wheelchair with all of his belongings when done with lunch.    ?

## 2022-01-03 LAB — GRAM STAIN: Gram Stain: NONE SEEN

## 2022-01-06 DIAGNOSIS — D649 Anemia, unspecified: Secondary | ICD-10-CM | POA: Diagnosis not present

## 2022-01-06 DIAGNOSIS — K7682 Hepatic encephalopathy: Secondary | ICD-10-CM | POA: Diagnosis not present

## 2022-01-06 DIAGNOSIS — K7031 Alcoholic cirrhosis of liver with ascites: Secondary | ICD-10-CM | POA: Diagnosis not present

## 2022-01-06 DIAGNOSIS — E785 Hyperlipidemia, unspecified: Secondary | ICD-10-CM | POA: Diagnosis not present

## 2022-01-06 LAB — CULTURE, BODY FLUID W GRAM STAIN -BOTTLE: Culture: NO GROWTH

## 2022-01-07 NOTE — ED Provider Notes (Signed)
?[facility] ?Provider Note ? ? ?CSN: [id number] ?Arrival date & time: 12/31/21  1544 ? ?  ? ?History ? ?Chief Complaint  ?Patient presents with  ? Altered Mental Status  ? ? ?Peter Nichols is a 76 y.o. male. ? ?Patient presents with increased confusion episodes.  Symptoms ongoing for the past 2 days.  Patient otherwise denies any fevers or cough.  No vomiting or diarrhea.  Denies any abdominal pain.  Wife states that at times he just does not seem to remember what he was just about to do or is doing.  He has a history of cirrhosis and is on lactulose and is not sure if he has been taking his regular doses.  He had a hernia surgery approximately 1 week ago, has no new abdominal pain. ? ? ?  ? ?Home Medications ?Prior to Admission medications   ?Medication Sig Start Date End Date Taking? Authorizing Provider  ?b complex vitamins capsule Take 1 capsule by mouth daily.   Yes [provider]  ?calcium carbonate (TUMS - DOSED IN MG ELEMENTAL CALCIUM) 500 MG chewable tablet Chew 1 tablet by mouth daily as needed for indigestion or heartburn.   Yes [provider]  ?Multiple Vitamin (MULTIVITAMIN WITH MINERALS) TABS tablet Take 1 tablet by mouth daily. 05/25/21  Yes Annita Brod, MD  ?pantoprazole (PROTONIX) 40 MG tablet Take 40 mg by mouth daily.   Yes [provider]  ?propranolol (INDERAL) 10 MG tablet Take 1 tablet (10 mg total) by mouth 2 (two) times daily. 06/03/21  Yes Elgergawy, Silver Huguenin, MD  ?feeding supplement (ENSURE ENLIVE / ENSURE PLUS) LIQD Take 237 mLs by mouth daily. 01/02/22   Allie Bossier, MD  ?lactulose (CHRONULAC) 10 GM/15ML solution Take 45 mLs (30 g total) by mouth 3 (three) times daily. 01/02/22   Allie Bossier, MD  ?Nutritional Supplements (,FEEDING SUPPLEMENT, PROSOURCE PLUS) liquid Take 30 mLs by mouth 2 (two) times daily between meals. 01/02/22   Allie Bossier, MD  ?pravastatin (PRAVACHOL) 20 MG tablet Take 3 tablets (60 mg  total) by mouth daily. 01/03/22   Allie Bossier, MD  ?rifaximin (XIFAXAN) 550 MG TABS tablet Take 1 tablet (550 mg total) by mouth 2 (two) times daily. 01/02/22   Allie Bossier, MD  ?   ? ?Allergies    ?Other   ? ?Review of Systems   ?Review of Systems  ?Constitutional:  Negative for fever.  ?HENT:  Negative for ear pain and sore throat.   ?Eyes:  Negative for pain.  ?Respiratory:  Negative for cough.   ?Cardiovascular:  Negative for chest pain.  ?Gastrointestinal:  Negative for abdominal pain.  ?Genitourinary:  Negative for flank pain.  ?Musculoskeletal:  Negative for back pain.  ?Skin:  Negative for color change and rash.  ?Neurological:  Negative for syncope.  ?All other systems reviewed and are negative. ? ?Physical Exam ?Updated Vital Signs ?BP (!) 142/60 (BP Location: Right Arm)   Pulse 73   Temp 98.7 ?F (37.1 ?C) (Oral)   Resp 18   Ht 5' 9.5" (1.765 m)   Wt 80.5 kg   SpO2 96%   BMI 25.83 kg/m?  ?Physical Exam ?Constitutional:   ?   Appearance: He is well-developed.  ?HENT:  ?   Head: Normocephalic.  ?   Nose: Nose normal.  ?Eyes:  ?   Extraocular Movements: Extraocular movements intact.  ?Cardiovascular:  ?   Rate and Rhythm:  Normal rate.  ?Pulmonary:  ?   Effort: Pulmonary effort is normal.  ?Skin: ?   Coloration: Skin is not jaundiced.  ?Neurological:  ?   Mental Status: He is alert. Mental status is at baseline.  ?   Comments: Patient awake and alert and oriented to person and place.  Slightly confused about time as he does not recall what year it is.  ? ? ?ED Results / Procedures / Treatments   ?Labs ?(all labs ordered are listed, but only abnormal results are displayed) ?Labs Reviewed  ?CBC WITH DIFFERENTIAL/PLATELET - Abnormal; Notable for the following components:  ?    Result Value  ? RBC 3.96 (*)   ? MCH 34.6 (*)   ? Platelets 105 (*)   ? All other components within normal limits  ?COMPREHENSIVE METABOLIC PANEL - Abnormal; Notable for the following components:  ? Potassium 3.4 (*)   ?  Glucose, Bld 182 (*)   ? Calcium 8.7 (*)   ? Total Protein 5.9 (*)   ? Albumin 2.8 (*)   ? AST 43 (*)   ? Total Bilirubin 5.7 (*)   ? All other components within normal limits  ?URINALYSIS, ROUTINE W REFLEX MICROSCOPIC - Abnormal; Notable for the following components:  ? Bilirubin Urine SMALL (*)   ? All other components within normal limits  ?AMMONIA - Abnormal; Notable for the following components:  ? Ammonia 89 (*)   ? All other components within normal limits  ?CBC WITH DIFFERENTIAL/PLATELET - Abnormal; Notable for the following components:  ? RBC 3.49 (*)   ? Hemoglobin 12.0 (*)   ? HCT 35.1 (*)   ? MCV 100.6 (*)   ? MCH 34.4 (*)   ? Platelets 75 (*)   ? All other components within normal limits  ?HEPATIC FUNCTION PANEL - Abnormal; Notable for the following components:  ? Total Protein 5.1 (*)   ? Albumin 2.5 (*)   ? Total Bilirubin 4.7 (*)   ? Bilirubin, Direct 1.0 (*)   ? Indirect Bilirubin 3.7 (*)   ? All other components within normal limits  ?BASIC METABOLIC PANEL - Abnormal; Notable for the following components:  ? Glucose, Bld 159 (*)   ? Calcium 8.6 (*)   ? All other components within normal limits  ?AMMONIA - Abnormal; Notable for the following components:  ? Ammonia 83 (*)   ? All other components within normal limits  ?AMMONIA - Abnormal; Notable for the following components:  ? Ammonia 52 (*)   ? All other components within normal limits  ?BODY FLUID CELL COUNT WITH DIFFERENTIAL - Abnormal; Notable for the following components:  ? Appearance, Fluid CLEAR (*)   ? Monocyte-Macrophage-Serous Fluid 37 (*)   ? All other components within normal limits  ?COMPREHENSIVE METABOLIC PANEL - Abnormal; Notable for the following components:  ? Glucose, Bld 119 (*)   ? Calcium 8.4 (*)   ? Total Protein 5.1 (*)   ? Albumin 2.5 (*)   ? Total Bilirubin 5.5 (*)   ? All other components within normal limits  ?CBC WITH DIFFERENTIAL/PLATELET - Abnormal; Notable for the following components:  ? RBC 3.48 (*)   ? Hemoglobin  12.0 (*)   ? HCT 35.7 (*)   ? MCV 102.6 (*)   ? MCH 34.5 (*)   ? Platelets 76 (*)   ? All other components within normal limits  ?LIPID PANEL - Abnormal; Notable for the following components:  ? LDL Cholesterol 119 (*)   ?  All other components within normal limits  ?VITAMIN B12 - Abnormal; Notable for the following components:  ? Vitamin B-12 1,190 (*)   ? All other components within normal limits  ?IRON AND TIBC - Abnormal; Notable for the following components:  ? TIBC 182 (*)   ? Saturation Ratios 82 (*)   ? All other components within normal limits  ?FERRITIN - Abnormal; Notable for the following components:  ? Ferritin 343 (*)   ? All other components within normal limits  ?RETICULOCYTES - Abnormal; Notable for the following components:  ? RBC. 3.46 (*)   ? All other components within normal limits  ?RESP PANEL BY RT-PCR (FLU A&B, COVID) ARPGX2  ?CULTURE, BODY FLUID W GRAM STAIN -BOTTLE  ?GRAM STAIN  ?PROTEIN, PLEURAL OR PERITONEAL FLUID  ?MAGNESIUM  ?PHOSPHORUS  ?PATHOLOGIST SMEAR REVIEW  ?FOLATE  ? ? ?EKG ?None ? ?Radiology ?No results found. ? ?Procedures ?Marland KitchenCritical Care ?Performed by: Luna Fuse, MD ?Authorized by: Luna Fuse, MD  ? ?Critical care provider statement:  ?  Critical care time (minutes):  40 ?  Critical care time was exclusive of:  Separately billable procedures and treating other patients and teaching time ?  Critical care was necessary to treat or prevent imminent or life-threatening deterioration of the following conditions:  CNS failure or compromise  ? ? ?Medications Ordered in ED ?Medications  ?potassium chloride SA (KLOR-CON M) CR tablet 40 mEq (40 mEq Oral Given 12/31/21 1921)  ?lactulose (CHRONULAC) 10 GM/15ML solution 30 g (30 g Oral Given 12/31/21 2347)  ?lidocaine (XYLOCAINE) 1 % (with pres) injection (10 mLs  Given 01/01/22 1444)  ? ? ?ED Course/ Medical Decision Making/ A&P ?  ?                        ?Medical Decision Making ?Amount and/or Complexity of Data Reviewed ?Labs:  ordered. ?Radiology: ordered. ? ?Risk ?Prescription drug management. ?Decision regarding hospitalization. ? ? ?Review of records shows history of recent surgery December 23, 2021. ? ?History obtained from patient as

## 2022-01-08 DIAGNOSIS — E785 Hyperlipidemia, unspecified: Secondary | ICD-10-CM | POA: Diagnosis not present

## 2022-01-08 DIAGNOSIS — I10 Essential (primary) hypertension: Secondary | ICD-10-CM | POA: Diagnosis not present

## 2022-01-12 ENCOUNTER — Other Ambulatory Visit (HOSPITAL_COMMUNITY): Payer: Self-pay | Admitting: Gastroenterology

## 2022-01-12 DIAGNOSIS — K7031 Alcoholic cirrhosis of liver with ascites: Secondary | ICD-10-CM

## 2022-01-13 ENCOUNTER — Other Ambulatory Visit (HOSPITAL_COMMUNITY): Payer: Self-pay | Admitting: Gastroenterology

## 2022-01-13 DIAGNOSIS — K7031 Alcoholic cirrhosis of liver with ascites: Secondary | ICD-10-CM

## 2022-01-16 ENCOUNTER — Ambulatory Visit (HOSPITAL_COMMUNITY)
Admission: RE | Admit: 2022-01-16 | Discharge: 2022-01-16 | Disposition: A | Discharge status: Home / Self Care | Payer: Medicare HMO | Source: Ambulatory Visit | Attending: Gastroenterology | Admitting: Gastroenterology

## 2022-01-16 DIAGNOSIS — K7031 Alcoholic cirrhosis of liver with ascites: Secondary | ICD-10-CM | POA: Diagnosis not present

## 2022-01-16 LAB — BODY FLUID CELL COUNT WITH DIFFERENTIAL
Eos, Fluid: 0 %
Lymphs, Fluid: 55 %
Monocyte-Macrophage-Serous Fluid: 37 % — ABNORMAL LOW (ref 50–90)
Neutrophil Count, Fluid: 8 % (ref 0–25)
Total Nucleated Cell Count, Fluid: 158 cu mm (ref 0–1000)

## 2022-01-16 MED ORDER — LIDOCAINE HCL 1 % IJ SOLN
INTRAMUSCULAR | Status: AC
  Filled 2022-01-16: qty 20

## 2022-01-16 NOTE — Procedures (Signed)
PROCEDURE SUMMARY: ? ?Successful ultrasound guided paracentesis from the right upper to mid quadrant.  ?Yielded  3.7 liters of yellow fluid.  ?No immediate complications.  ?The patient tolerated the procedure well.  ? ?Specimen was sent for labs. ? ?EBL < 24m ? ?The patient has required >/=2 paracenteses in a 30 day period and a formal evaluation by the [facility] has been arranged. ? ? ?KRowe Robert PAC ? ? ? ? ? ? ? ? ? ?

## 2022-01-20 DIAGNOSIS — K7031 Alcoholic cirrhosis of liver with ascites: Secondary | ICD-10-CM | POA: Diagnosis not present

## 2022-01-20 DIAGNOSIS — D696 Thrombocytopenia, unspecified: Secondary | ICD-10-CM | POA: Diagnosis not present

## 2022-01-20 DIAGNOSIS — I10 Essential (primary) hypertension: Secondary | ICD-10-CM | POA: Diagnosis not present

## 2022-01-20 DIAGNOSIS — E785 Hyperlipidemia, unspecified: Secondary | ICD-10-CM | POA: Diagnosis not present

## 2022-01-20 DIAGNOSIS — I700 Atherosclerosis of aorta: Secondary | ICD-10-CM | POA: Diagnosis not present

## 2022-01-27 ENCOUNTER — Other Ambulatory Visit (HOSPITAL_COMMUNITY): Payer: Self-pay | Admitting: Gastroenterology

## 2022-01-27 DIAGNOSIS — K7031 Alcoholic cirrhosis of liver with ascites: Secondary | ICD-10-CM

## 2022-01-29 ENCOUNTER — Ambulatory Visit (HOSPITAL_COMMUNITY)
Admission: RE | Admit: 2022-01-29 | Discharge: 2022-01-29 | Disposition: A | Discharge status: Home / Self Care | Payer: Medicare HMO | Source: Ambulatory Visit | Attending: Gastroenterology | Admitting: Gastroenterology

## 2022-01-29 DIAGNOSIS — K7031 Alcoholic cirrhosis of liver with ascites: Secondary | ICD-10-CM | POA: Insufficient documentation

## 2022-01-29 HISTORY — PX: IR PARACENTESIS: IMG2679

## 2022-01-29 MED ORDER — LIDOCAINE HCL 1 % IJ SOLN
INTRAMUSCULAR | Status: DC | PRN
  Administered 2022-01-29: 10 mL

## 2022-01-29 MED ORDER — ALBUMIN HUMAN 25 % IV SOLN
INTRAVENOUS | Status: AC
  Administered 2022-01-29: 50 g via INTRAVENOUS
  Filled 2022-01-29: qty 200

## 2022-01-29 MED ORDER — ALBUMIN HUMAN 25 % IV SOLN
50.0000 g | Freq: Once | INTRAVENOUS | Status: AC
  Filled 2022-01-29: qty 200

## 2022-01-29 MED ORDER — LIDOCAINE HCL 1 % IJ SOLN
INTRAMUSCULAR | Status: AC
  Filled 2022-01-29: qty 20

## 2022-01-29 NOTE — Procedures (Signed)
PROCEDURE SUMMARY: ? ?Successful ultrasound guided paracentesis from the right lower quadrant.  ?Yielded 3.2 liters of clear, yellow fluid.  ?No immediate complications.  ?The patient tolerated the procedure well.  ? ?Specimen was not sent for labs. ? ?EBL < 28m ? ?If the patient eventually requires >/=2 paracenteses in a 30 day period, screening evaluation by the [facility] will be assessed. ? ? ?KBrynda Greathouse MS RD PA-C ? ? ?

## 2022-01-29 NOTE — Progress Notes (Signed)
? ?  Portal Hypertension Clinic Screening Evaluation ? ? ?Indication for evaluation: ?Peter Nichols is a 76 y.o. male undergoing preliminary evaluation in the [facility] due to recurrent ascites. ? ?Referring Physician/Established Gastroenterologist:  Arta Silence, MD ? ?Etiology of cirrhosis: EtOH ?Initially diagnosed: August 2022 ?# of paracentesis in last month: 3 ?# of paracentesis in last 2 months: 3 ?History of hepatic hydrothorax:  No ?History of hepatic encephalopathy: Yes (August 2022) ? ?Prior evaluation for liver transplant: None documented ?History of hepatocellular carcinoma: No ? ?Prior esophagogastroduodenoscopy/intervention: 08/06/21 (Dr. [name]) ?Current esophageal varices: No ?Current gastric varices: No ?History of hematemesis: No ? ?Current diuretic regimen: None ?Current pharmacologic encephalopathy prophylaxis/treatment: Lactulose, rifaximin ? ?History of renal dysfunction: No ?History of hemodialysis: No ? ?History of cardiac dysfunction: No ? ?Other pertinent past medical history: hyperlipidemia, hypertension ? ? ?Imaging: ?Prior cross sectional imaging of portal system: ?CT AP 04/23/21 ? ? ? ? ?Patent, dilated portal system.  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 ? ?Echocardiogram: None on file ? ? ?Labs: ?3/7-17/23 ?Creatinine: 0.73 ?Total Bilirubin: 5.5 ?INR: 1.7 ?Sodium: 136 ?Albumin: 2.5 ?Ammonia: 52 ? ?Child-Pugh = 12 points, class C ?MELD = 19 ?[place] Index of Post-TIPS Survival (FIPS) = -0.10 (Overall survival at 1 month 95.9%, 3 months 87.8%, and 6 months 82.7%) ? ? ? ?Assessment: ?Peter Nichols is a 76 y.o. male with history of alcoholic cirrhosis and recurrent ascites (Child Pugh C, MELD 19).  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  This  varix is likely resulting it natural porto-systemic shunting, and the etiology of prior encephalopathy.  After preliminary evaluation, this patient is a marginal candidate for TIPS creation, as he currently has recurrent ascites yet is not taking diuretics per current EMR information. ? ?Recommendation: ?Formal consult for TIPS and possible ectopic variceal embolization could be considered after maximization of diuretics or if there is a contraindication to such.  The patient's Gastroenterologist, Dr. [name], will be contacted for further discussion. ? ? ? ?Electronically Signed: ?Suzette Battiest, MD ?01/29/2022, 3:11 PM ? ? ? ?

## 2022-02-09 ENCOUNTER — Other Ambulatory Visit (HOSPITAL_COMMUNITY): Payer: Self-pay | Admitting: Gastroenterology

## 2022-02-09 DIAGNOSIS — K7031 Alcoholic cirrhosis of liver with ascites: Secondary | ICD-10-CM

## 2022-02-11 ENCOUNTER — Other Ambulatory Visit (HOSPITAL_COMMUNITY): Payer: Self-pay | Admitting: Gastroenterology

## 2022-02-11 DIAGNOSIS — K7031 Alcoholic cirrhosis of liver with ascites: Secondary | ICD-10-CM

## 2022-02-12 ENCOUNTER — Ambulatory Visit (HOSPITAL_COMMUNITY)
Admission: RE | Admit: 2022-02-12 | Discharge: 2022-02-12 | Disposition: A | Discharge status: Home / Self Care | Payer: Medicare HMO | Source: Ambulatory Visit | Attending: Gastroenterology | Admitting: Gastroenterology

## 2022-02-12 DIAGNOSIS — K7031 Alcoholic cirrhosis of liver with ascites: Secondary | ICD-10-CM | POA: Insufficient documentation

## 2022-02-12 HISTORY — PX: IR PARACENTESIS: IMG2679

## 2022-02-12 MED ORDER — ALBUMIN HUMAN 25 % IV SOLN
INTRAVENOUS | Status: AC
  Filled 2022-02-12: qty 200

## 2022-02-12 MED ORDER — ALBUMIN HUMAN 25 % IV SOLN
50.0000 g | Freq: Once | INTRAVENOUS | Status: AC
  Administered 2022-02-12: 50 g via INTRAVENOUS
  Filled 2022-02-12: qty 200

## 2022-02-12 MED ORDER — LIDOCAINE HCL 1 % IJ SOLN
INTRAMUSCULAR | Status: AC
  Filled 2022-02-12: qty 20

## 2022-02-12 NOTE — Procedures (Signed)
PROCEDURE SUMMARY: ? ?Successful ultrasound guided paracentesis from the right upper  quadrant.  ?Yielded 3.3 L of clear, yellow fluid.  ?No immediate complications.  ?The patient tolerated the procedure well.  ? ?Specimen not sent for labs. ? ?EBL < 40m ? ?If the patient eventually requires >/=2 paracenteses in a 30 day period, screening evaluation by the [facility] will be assessed. ? ? ? ?SNarda Rutherford AGNP-BC ?02/12/2022, 2:24 PM ?  ? ?

## 2022-02-16 ENCOUNTER — Other Ambulatory Visit: Payer: Self-pay | Admitting: Gastroenterology

## 2022-02-16 DIAGNOSIS — K7460 Unspecified cirrhosis of liver: Secondary | ICD-10-CM

## 2022-02-16 DIAGNOSIS — K7031 Alcoholic cirrhosis of liver with ascites: Secondary | ICD-10-CM | POA: Diagnosis not present

## 2022-02-17 ENCOUNTER — Ambulatory Visit
Admission: RE | Admit: 2022-02-17 | Discharge: 2022-02-17 | Disposition: A | Discharge status: Home / Self Care | Payer: Medicare HMO | Source: Ambulatory Visit | Attending: Gastroenterology | Admitting: Gastroenterology

## 2022-02-17 DIAGNOSIS — R188 Other ascites: Secondary | ICD-10-CM | POA: Diagnosis not present

## 2022-02-17 DIAGNOSIS — K7460 Unspecified cirrhosis of liver: Secondary | ICD-10-CM

## 2022-02-17 DIAGNOSIS — J90 Pleural effusion, not elsewhere classified: Secondary | ICD-10-CM | POA: Diagnosis not present

## 2022-02-25 DIAGNOSIS — X32XXXD Exposure to sunlight, subsequent encounter: Secondary | ICD-10-CM | POA: Diagnosis not present

## 2022-02-25 DIAGNOSIS — L570 Actinic keratosis: Secondary | ICD-10-CM | POA: Diagnosis not present

## 2022-02-26 ENCOUNTER — Other Ambulatory Visit: Payer: Self-pay | Admitting: Gastroenterology

## 2022-02-26 DIAGNOSIS — M6281 Muscle weakness (generalized): Secondary | ICD-10-CM | POA: Diagnosis not present

## 2022-02-26 DIAGNOSIS — R2681 Unsteadiness on feet: Secondary | ICD-10-CM | POA: Diagnosis not present

## 2022-02-26 DIAGNOSIS — K7460 Unspecified cirrhosis of liver: Secondary | ICD-10-CM

## 2022-03-04 ENCOUNTER — Ambulatory Visit
Admission: RE | Admit: 2022-03-04 | Discharge: 2022-03-04 | Disposition: A | Discharge status: Home / Self Care | Payer: Medicare HMO | Source: Ambulatory Visit | Attending: Gastroenterology | Admitting: Gastroenterology

## 2022-03-04 ENCOUNTER — Encounter (HOSPITAL_COMMUNITY): Payer: Self-pay

## 2022-03-04 ENCOUNTER — Encounter: Payer: Self-pay | Admitting: *Deleted

## 2022-03-04 ENCOUNTER — Other Ambulatory Visit (HOSPITAL_COMMUNITY): Payer: Self-pay | Admitting: Interventional Radiology

## 2022-03-04 DIAGNOSIS — R188 Other ascites: Secondary | ICD-10-CM

## 2022-03-04 DIAGNOSIS — K7031 Alcoholic cirrhosis of liver with ascites: Secondary | ICD-10-CM

## 2022-03-04 DIAGNOSIS — K766 Portal hypertension: Secondary | ICD-10-CM | POA: Diagnosis not present

## 2022-03-04 HISTORY — PX: IR RADIOLOGIST EVAL & MGMT: IMG5224

## 2022-03-04 NOTE — Consult Note (Signed)
? ? ?Chief Complaint: ?Patient was seen in consultation today for TIPS for Portal Hypertension  at the request of Outlaw,Dontaye ? ?Referring Physician(s): ?Outlaw,Sota ? ?History of Present Illness: ?Peter Nichols is a 76 y.o. 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   He was initially diagnosed in August, 2022.  He required 5 paracenteses in March and April.  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  He has a history of hepatic encephalopathy, being treated with lactulose and rifaximin.  He is not a good candidate for diuretics.  ? ?He is accompanied today by his wife and two daughters. He is eager for a paracentesis because of his abdominal distension.  His appetite has not been great.  He becomes full with small amounts of food, per his wife. He does not have significant pain, nausea, or vomiting.  He denies hematemesis, melena, and bright red blood per rectum. ? ?Past Medical History:  ?Diagnosis Date  ? Cirrhosis ([place])   ? Dysphagia   ? GERD (gastroesophageal reflux disease)   ? Hyperlipidemia   ? Hypertension   ? Neuromuscular disorder ([place])   ? tremor  ? Steatohepatitis, non-alcoholic   ? Thrombocytopenia ([place])   ? ? ?Past Surgical History:  ?Procedure Laterality Date  ? CHOLECYSTECTOMY    ? ESOPHAGOGASTRODUODENOSCOPY (EGD) WITH PROPOFOL Bilateral 08/06/2021  ? Procedure: ESOPHAGOGASTRODUODENOSCOPY (EGD) WITH PROPOFOL;  Surgeon: Arta Silence, MD;  Location: [facility];  Service: Endoscopy;  Laterality: Bilateral;  ? INGUINAL HERNIA REPAIR Right 12/23/2021  ? Procedure: RIGHT INGUINAL HERNIA REPAIR- OPEN;  Surgeon: Johnathan Hausen, MD;  Location: [facility];  Service: General;  Laterality: Right;  ? IR PARACENTESIS  06/16/2021  ? IR PARACENTESIS  07/11/2021  ? IR PARACENTESIS  01/29/2022  ? IR PARACENTESIS   02/12/2022  ? IR RADIOLOGIST EVAL & MGMT  03/04/2022  ? STOMACH SURGERY    ? ? ?Allergies: ?Other ? ?Medications: ?Prior to Admission medications   ?Medication Sig Start Date End Date Taking? Authorizing Provider  ?b complex vitamins capsule Take 1 capsule by mouth daily.    [provider]  ?calcium carbonate (TUMS - DOSED IN MG ELEMENTAL CALCIUM) 500 MG chewable tablet Chew 1 tablet by mouth daily as needed for indigestion or heartburn.    [provider]  ?feeding supplement (ENSURE ENLIVE / ENSURE PLUS) LIQD Take 237 mLs by mouth daily. 01/02/22   Allie Bossier, MD  ?lactulose (CHRONULAC) 10 GM/15ML solution Take 45 mLs (30 g total) by mouth 3 (three) times daily. 01/02/22   Allie Bossier, MD  ?Multiple Vitamin (MULTIVITAMIN WITH MINERALS) TABS tablet Take 1 tablet by mouth daily. 05/25/21   Annita Brod, MD  ?Nutritional Supplements (,FEEDING SUPPLEMENT, PROSOURCE PLUS) liquid Take 30 mLs by mouth 2 (two) times daily between meals. 01/02/22   Allie Bossier, MD  ?pantoprazole (PROTONIX) 40 MG tablet Take 40 mg by mouth daily.    [provider]  ?pravastatin (PRAVACHOL) 20 MG tablet Take 3 tablets (60 mg total) by mouth daily. 01/03/22   Allie Bossier, MD  ?propranolol (INDERAL) 10 MG tablet Take 1 tablet (10 mg total) by mouth 2 (two) times daily. 06/03/21   Elgergawy, Silver Huguenin, MD  ?rifaximin (XIFAXAN) 550 MG TABS tablet Take 1 tablet (550 mg total) by mouth 2 (  two) times daily. 01/02/22   Allie Bossier, MD  ?  ? ?Family History  ?Problem Relation Age of Onset  ? Cancer Mother   ? ? ?Social History  ? ?Socioeconomic History  ? Marital status: Married  ?  Spouse name: Not on file  ? Number of children: Not on file  ? Years of education: Not on file  ? Highest education level: Not on file  ?Occupational History  ? Not on file  ?Tobacco Use  ? Smoking status: Never  ? Smokeless tobacco: Never  ?Vaping Use  ? Vaping Use: Never used  ?Substance and Sexual Activity  ? Alcohol use:  Not Currently  ?  Comment: none in 8 months on preop visit of 12/10/21  ? Drug use: Never  ? Sexual activity: Not on file  ?Other Topics Concern  ? Not on file  ?Social History Narrative  ? Not on file  ? ?Social Determinants of Health  ? ?Financial Resource Strain: Not on file  ?Food Insecurity: Not on file  ?Transportation Needs: Not on file  ?Physical Activity: Not on file  ?Stress: Not on file  ?Social Connections: Not on file  ? ? ?ECOG Status: ?1 - Symptomatic but completely ambulatory ? ?Review of Systems: A 12 point ROS discussed and pertinent positives are indicated in the HPI above.  All other systems are negative. ? ?Review of Systems ? ?Vital Signs: ?BP 131/61 (BP Location: Left Arm)   Pulse (!) 55   SpO2 95%  ? ?Physical Exam ? ?Mallampati Score: ? ?  ? ?Imaging: ?IR Radiologist Eval & Mgmt ? ?Result Date: 03/04/2022 ?Please refer to notes tab for details about interventional procedure. (Op Note) ? ?US Abdomen Limited RUQ (LIVER/GB) ? ?Result Date: 02/17/2022 ?CLINICAL DATA:  Patient with history of cirrhosis. EXAM: ULTRASOUND ABDOMEN LIMITED RIGHT UPPER QUADRANT COMPARISON:  Ultrasound abdomen 10/10/2021 FINDINGS: Gallbladder: Surgically absent Common bile duct: Diameter: 2 mm Liver: Morphologic changes to the liver compatible with cirrhosis. There is a 1.6 x 1.2 x 1.5 cm cyst within the liver. Portal vein is patent on color Doppler imaging with normal direction of blood flow towards the liver. Other: Right upper quadrant ascites.  Small right pleural effusion. IMPRESSION: Morphologic changes to the liver compatible with cirrhosis. Small hepatic cyst. Prior cholecystectomy. Ascites and right pleural effusion. Electronically Signed   By: Lovey Newcomer M.D.   On: 02/17/2022 14:33  ? ?IR Paracentesis ? ?Result Date: 02/12/2022 ?INDICATION: Patient with history of alcoholic cirrhosis with recurrent ascites. Request received for therapeutic paracentesis. EXAM: ULTRASOUND GUIDED THERAPEUTIC RIGHT UPPER QUADRANT  PARACENTESIS MEDICATIONS: 10 mL 1 % lidocaine COMPLICATIONS: None immediate. PROCEDURE: Informed written consent was obtained from the patient after a discussion of the risks, benefits and alternatives to treatment. A timeout was performed prior to the initiation of the procedure. Initial ultrasound scanning demonstrates a large amount of ascites within the right upper abdominal quadrant. The right upper abdomen was prepped and draped in the usual sterile fashion. 1% lidocaine was used for local anesthesia. Following this, a 19 gauge, 7-cm, Yueh catheter was introduced. An ultrasound image was saved for documentation purposes. The paracentesis was performed. The catheter was removed and a dressing was applied. The patient tolerated the procedure well without immediate post procedural complication. Patient received post-procedure intravenous albumin; see nursing notes for details. FINDINGS: A total of approximately 3.3 L of clear, yellow fluid was removed. IMPRESSION: Successful ultrasound-guided paracentesis yielding 3.3 liters of peritoneal fluid. Read by: Narda Rutherford, AGNP-BC  PLAN: If the patient eventually requires >/=2 paracenteses in a 30 day period, candidacy for formal evaluation by the [facility] will be assessed. Electronically Signed   By: Markus Daft M.D.   On: 02/12/2022 16:55   ? ?Labs: ? ?CBC: ?Recent Labs  ?  12/23/21 ?1208 12/31/21 ?1617 01/01/22 ?0351 01/02/22 ?0351  ?WBC 10.3 6.3 4.5 4.7  ?HGB 13.8 13.7 12.0* 12.0*  ?HCT 40.9 39.3 35.1* 35.7*  ?PLT 116* 105* 75* 76*  ? ? ?COAGS: ?Recent Labs  ?  05/27/21 ?1634 11/07/21 ?1241 12/10/21 ?1245 12/23/21 ?1208  ?INR 1.5* 1.6* 1.6* 1.7*  ?APTT 31  --  35 34  ? ? ?BMP: ?Recent Labs  ?  12/23/21 ?1208 12/31/21 ?1617 01/01/22 ?0351 01/02/22 ?0351  ?NA 130* 136 137 136  ?K 4.8 3.4* 3.6 3.8  ?CL 102 106 105 104  ?CO2 23 24 24 24   ?GLUCOSE 122* 182* 159* 119*  ?BUN 20 15 14 15   ?CALCIUM 8.4* 8.7* 8.6* 8.4*   ?CREATININE 1.07 0.84 0.78 0.73  ?GFRNONAA >60 >60 >60 >60  ? ? ?LIVER FUNCTION TESTS: ?Recent Labs  ?  12/23/21 ?1208 12/31/21 ?1617 01/01/22 ?0351 01/02/22 ?0351  ?BILITOT 7.8* 5.7* 4.7* 5.5*  ?AST 41 43* 39 34  ?

## 2022-03-05 ENCOUNTER — Other Ambulatory Visit (HOSPITAL_COMMUNITY): Payer: Self-pay | Admitting: Interventional Radiology

## 2022-03-05 ENCOUNTER — Ambulatory Visit (HOSPITAL_COMMUNITY)
Admission: RE | Admit: 2022-03-05 | Discharge: 2022-03-05 | Disposition: A | Discharge status: Home / Self Care | Payer: Medicare HMO | Source: Ambulatory Visit | Attending: Interventional Radiology | Admitting: Interventional Radiology

## 2022-03-05 DIAGNOSIS — R188 Other ascites: Secondary | ICD-10-CM | POA: Diagnosis not present

## 2022-03-05 DIAGNOSIS — K7031 Alcoholic cirrhosis of liver with ascites: Secondary | ICD-10-CM | POA: Diagnosis not present

## 2022-03-05 DIAGNOSIS — K7460 Unspecified cirrhosis of liver: Secondary | ICD-10-CM | POA: Insufficient documentation

## 2022-03-05 DIAGNOSIS — Z01818 Encounter for other preprocedural examination: Secondary | ICD-10-CM

## 2022-03-05 MED ORDER — LIDOCAINE HCL 1 % IJ SOLN
INTRAMUSCULAR | Status: AC
  Administered 2022-03-05: 10 mL
  Filled 2022-03-05: qty 20

## 2022-03-05 NOTE — Procedures (Signed)
Ultrasound-guided therapeutic paracentesis performed yielding 3.4 liters of yellow  fluid. No immediate complications. EBL none.

## 2022-03-06 ENCOUNTER — Ambulatory Visit (HOSPITAL_COMMUNITY)
Admission: RE | Admit: 2022-03-06 | Discharge: 2022-03-06 | Disposition: A | Discharge status: Home / Self Care | Payer: Medicare HMO | Source: Ambulatory Visit | Attending: Interventional Radiology | Admitting: Interventional Radiology

## 2022-03-06 DIAGNOSIS — I2602 Saddle embolus of pulmonary artery with acute cor pulmonale: Secondary | ICD-10-CM | POA: Diagnosis not present

## 2022-03-06 DIAGNOSIS — I10 Essential (primary) hypertension: Secondary | ICD-10-CM | POA: Diagnosis not present

## 2022-03-06 DIAGNOSIS — R0602 Shortness of breath: Secondary | ICD-10-CM | POA: Insufficient documentation

## 2022-03-06 DIAGNOSIS — I2699 Other pulmonary embolism without acute cor pulmonale: Secondary | ICD-10-CM | POA: Diagnosis not present

## 2022-03-06 DIAGNOSIS — R0600 Dyspnea, unspecified: Secondary | ICD-10-CM | POA: Diagnosis not present

## 2022-03-06 DIAGNOSIS — Z01818 Encounter for other preprocedural examination: Secondary | ICD-10-CM | POA: Insufficient documentation

## 2022-03-06 DIAGNOSIS — R55 Syncope and collapse: Secondary | ICD-10-CM | POA: Diagnosis not present

## 2022-03-06 DIAGNOSIS — E785 Hyperlipidemia, unspecified: Secondary | ICD-10-CM | POA: Diagnosis not present

## 2022-03-06 DIAGNOSIS — K7031 Alcoholic cirrhosis of liver with ascites: Secondary | ICD-10-CM

## 2022-03-06 LAB — ECHOCARDIOGRAM COMPLETE
Area-P 1/2: 2.62 cm2
Calc EF: 61.9 %
S' Lateral: 2.6 cm
Single Plane A2C EF: 66.5 %
Single Plane A4C EF: 60 %

## 2022-03-06 NOTE — Progress Notes (Signed)
  Echocardiogram 2D Echocardiogram has been performed.  Bobbye Charleston 03/06/2022, 11:17 AM

## 2022-03-10 DIAGNOSIS — M6281 Muscle weakness (generalized): Secondary | ICD-10-CM | POA: Diagnosis not present

## 2022-03-10 DIAGNOSIS — R2681 Unsteadiness on feet: Secondary | ICD-10-CM | POA: Diagnosis not present

## 2022-03-10 NOTE — Pre-Procedure Instructions (Signed)
Surgical Instructions    Your procedure is scheduled on Tuesday, May 30th.  Report to [facility] Main Entrance "A" at 11:30 A.M., then check in with the Admitting office.  Call this number if you have problems the morning of surgery:  [phone]   If you have any questions prior to your surgery date call [phone]: Open Monday-Friday 8am-4pm    Remember:  Do not eat after midnight the night before your surgery  You may drink clear liquids until 10:30 a.m. the morning of your surgery.   Clear liquids allowed are: Water, Non-Citrus Juices (without pulp), Carbonated Beverages, Clear Tea, Black Coffee ONLY (NO MILK, CREAM OR POWDERED CREAMER of any kind), and Gatorade    Take these medicines the morning of surgery with A SIP OF WATER:  pravastatin (PRAVACHOL)  propranolol (INDERAL) rifaximin (XIFAXAN)   As of today, STOP taking any Aspirin (unless otherwise instructed by your surgeon) Aleve, Naproxen, Ibuprofen, Motrin, Advil, Goody's, BC's, all herbal medications, fish oil, and all vitamins.           Do not wear jewelry. Do not wear lotions, powders, colognes, or deodorant. Men may shave face and neck. Do not bring valuables to the hospital.  [facility] is not responsible for any belongings or valuables. .   Do NOT Smoke (Tobacco/Vaping)  24 hours prior to your procedure  If you use a CPAP at night, you may bring your mask for your overnight stay.   Contacts, glasses, hearing aids, dentures or partials may not be worn into surgery, please bring cases for these belongings   For patients admitted to the hospital, discharge time will be determined by your treatment team.   Patients discharged the day of surgery will not be allowed to drive home, and someone needs to stay with them for 24 hours.   SURGICAL WAITING ROOM VISITATION Patients having surgery or a procedure in a hospital may have two support people. Children under the age of 56 must have an adult with them who  is not the patient. They may stay in the waiting area during the procedure and may switch out with other visitors. If the patient needs to stay at the hospital during part of their recovery, the visitor guidelines for inpatient rooms apply.  Please refer to the [facility] website for the visitor guidelines for Inpatients (after your surgery is over and you are in a regular room).       Special instructions:    Oral Hygiene is also important to reduce your risk of infection.  Remember - BRUSH YOUR TEETH THE MORNING OF SURGERY WITH YOUR REGULAR TOOTHPASTE   [place]- Preparing For Surgery  Before surgery, you can play an important role. Because skin is not sterile, your skin needs to be as free of germs as possible. You can reduce the number of germs on your skin by washing with CHG (chlorahexidine gluconate) Soap before surgery.  CHG is an antiseptic cleaner which kills germs and bonds with the skin to continue killing germs even after washing.     Please do not use if you have an allergy to CHG or antibacterial soaps. If your skin becomes reddened/irritated stop using the CHG.  Do not shave (including legs and underarms) for at least 48 hours prior to first CHG shower. It is OK to shave your face.  Please follow these instructions carefully.     Shower the NIGHT BEFORE SURGERY and the MORNING OF SURGERY with CHG Soap.   If  you chose to wash your hair, wash your hair first as usual with your normal shampoo. After you shampoo, rinse your hair and body thoroughly to remove the shampoo.  Then [employer] and genitals (private parts) with your normal soap and rinse thoroughly to remove soap.  After that Use CHG Soap as you would any other liquid soap. You can apply CHG directly to the skin and wash gently with a scrungie or a clean washcloth.   Apply the CHG Soap to your body ONLY FROM THE NECK DOWN.  Do not use on open wounds or open sores. Avoid contact with your eyes, ears, mouth and  genitals (private parts). Wash Face and genitals (private parts)  with your normal soap.   Wash thoroughly, paying special attention to the area where your surgery will be performed.  Thoroughly rinse your body with warm water from the neck down.  DO NOT shower/wash with your normal soap after using and rinsing off the CHG Soap.  Pat yourself dry with a CLEAN TOWEL.  Wear CLEAN PAJAMAS to bed the night before surgery  Place CLEAN SHEETS on your bed the night before your surgery  DO NOT SLEEP WITH PETS.   Day of Surgery:  Take a shower with CHG soap. Wear Clean/Comfortable clothing the morning of surgery Do not apply any deodorants/lotions.   Remember to brush your teeth WITH YOUR REGULAR TOOTHPASTE.    If you received a COVID test during your pre-op visit, it is requested that you wear a mask when out in public, stay away from anyone that may not be feeling well, and notify your surgeon if you develop symptoms. If you have been in contact with anyone that has tested positive in the last 10 days, please notify your surgeon.    Please read over the following fact sheets that you were given.

## 2022-03-11 ENCOUNTER — Inpatient Hospital Stay (HOSPITAL_COMMUNITY)
Admission: RE | Admit: 2022-03-11 | Discharge: 2022-03-11 | Disposition: A | Discharge status: Home / Self Care | Payer: Medicare HMO | Source: Ambulatory Visit

## 2022-03-12 DIAGNOSIS — M6281 Muscle weakness (generalized): Secondary | ICD-10-CM | POA: Diagnosis not present

## 2022-03-12 DIAGNOSIS — R2681 Unsteadiness on feet: Secondary | ICD-10-CM | POA: Diagnosis not present

## 2022-03-13 ENCOUNTER — Other Ambulatory Visit: Payer: Self-pay | Admitting: Radiology

## 2022-03-13 ENCOUNTER — Encounter (HOSPITAL_COMMUNITY): Payer: Self-pay | Admitting: Interventional Radiology

## 2022-03-13 DIAGNOSIS — K7031 Alcoholic cirrhosis of liver with ascites: Secondary | ICD-10-CM

## 2022-03-13 NOTE — Anesthesia Preprocedure Evaluation (Signed)
Anesthesia Evaluation  Patient identified by MRN, date of birth, ID band Patient awake    Reviewed: Allergy & Precautions, NPO status , Patient's Chart, lab work & pertinent test results, reviewed documented beta blocker date and time   Airway Mallampati: III  TM Distance: >3 FB Neck ROM: Full    Dental  (+) Poor Dentition, Chipped, Dental Advisory Given   Pulmonary neg pulmonary ROS,    Pulmonary exam normal breath sounds clear to auscultation       Cardiovascular hypertension (129/71 in preop ), Pt. on home beta blockers and Pt. on medications Normal cardiovascular exam Rhythm:Regular Rate:Normal  Echo 03/06/22: IMPRESSIONS  1. Left ventricular ejection fraction, by estimation, is 60 to 65%. The  left ventricle has normal function. The left ventricle has no regional  wall motion abnormalities. Left ventricular diastolic parameters were  normal. The average left ventricular  global longitudinal strain is -23.3 %. The global longitudinal strain is  normal.  2. Right ventricular systolic function is normal. The right ventricular  size is normal.  3. The mitral valve is normal in structure. Trivial mitral valve  regurgitation. No evidence of mitral stenosis.  4. The aortic valve is tricuspid. There is mild calcification of the  aortic valve. There is mild thickening of the aortic valve. Aortic valve  regurgitation is not visualized. Aortic valve sclerosis is present, with  no evidence of aortic valve stenosis.  5. The inferior vena cava is normal in size with greater than 50%  respiratory variability, suggesting right atrial pressure of 3 mmHg.     Pre-procedure echo on 03/06/22 showed LVEF 60-65%, no RWMA, normal RVSF, trivial MR, mild AV calcifications/thickening, no AS visualized.   Neuro/Psych negative neurological ROS  negative psych ROS   GI/Hepatic GERD  Controlled,(+) Cirrhosis   ascites    , Alcoholic  cirrhosis (initially diagnosed 05/2021; has portal hypertension; recurrent ascites, s/p last paracentesis 02/12/22; hepatic encephalopathy, last admission 12/2021 and on lactulose and rifaximin; no esophageal or gastric varices identified on 08/06/21 EGD   Endo/Other  negative endocrine ROS  Renal/GU negative Renal ROS  negative genitourinary   Musculoskeletal negative musculoskeletal ROS (+)   Abdominal   Peds negative pediatric ROS (+)  Hematology PLT count 75-116 since 12/2021. INR 1.7 on 12/23/21.    Anesthesia Other Findings   Reproductive/Obstetrics negative OB ROS                            Anesthesia Physical Anesthesia Plan  ASA: 3  Anesthesia Plan: General   Post-op Pain Management:    Induction: Intravenous  PONV Risk Score and Plan: 2 and Ondansetron, Dexamethasone, Midazolam and Treatment may vary due to age or medical condition  Airway Management Planned: Oral ETT  Additional Equipment: Arterial line  Intra-op Plan:   Post-operative Plan: Extubation in OR  Informed Consent: I have reviewed the patients History and Physical, chart, labs and discussed the procedure including the risks, benefits and alternatives for the proposed anesthesia with the patient or authorized representative who has indicated his/her understanding and acceptance.     Dental advisory given  Plan Discussed with: CRNA  Anesthesia Plan Comments: (Inguinal hernia repair in march 2023: Ventilation: Mask ventilation without difficulty and Oral airway inserted - appropriate to patient size Laryngoscope Size: Miller and 3 Grade View: Grade I Tube type: Oral Number of attempts: 1)       Anesthesia Quick Evaluation

## 2022-03-13 NOTE — Progress Notes (Signed)
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  He was referred to IR for TIPS by GI Dr. [name]. Pre-procedure echo on 03/06/22 showed LVEF 60-65%, no RWMA, normal RVSF, trivial MR, mild AV calcifications/thickening, no AS visualized.  He is a same day work-up, so labs and anesthesia team evaluation on the day of surgery. PLT count 75-116 since 12/2021. INR 1.7 on 12/23/21.    VS:  BP Readings from Last 3 Encounters:  03/05/22 (!) 115/48  03/04/22 131/61  01/16/22 (!) 115/57   Pulse Readings from Last 3 Encounters:  03/04/22 (!) 55  01/01/22 73  12/23/21 (!) 57     PROVIDERS: [name], MD is PCP  Arta Silence, MD is GI   LABS: For day of surgery as indicated. As of 01/02/22, glucose 119, Cr 0.73, AST 34, ALT 31, total bili 5.5, ammonia 52 (01/01/22), H/H 12.0/35.7, PLT 76K. PT 20.3, INR 1.7, PTT 34 on 12/23/21.    OTHER: EGD 08/06/21: IMPRESSION: - Normal  esophagus. - Portal hypertensive gastropathy. - Normal duodenal bulb, first portion of the duodenum and second portion of the duodenum. - No esophageal or gastric varices were identified.   IMAGES: [place] Abd limited RUQ 02/17/22: IMPRESSION: - Morphologic changes to the liver compatible with cirrhosis. Small hepatic cyst. - Prior cholecystectomy. - Ascites and right pleural effusion.  CT Head 12/31/21: IMPRESSION: Atrophy, chronic microvascular disease. No acute intracranial abnormality.   EKG: 05/27/21: SB at 52 bpm. First degree AV Block (PR 220 ms). Low voltage, extremity and precordial leads.    CV: Echo 03/06/22: IMPRESSIONS   1. Left ventricular ejection fraction, by estimation, is 60 to 65%. The  left ventricle has normal function. The left ventricle has no regional  wall motion abnormalities. Left ventricular diastolic parameters were  normal. The average left ventricular  global longitudinal strain is -23.3 %. The global longitudinal strain is  normal.   2. Right ventricular systolic function is normal. The right ventricular  size is normal.   3. The mitral valve is normal in structure. Trivial mitral valve  regurgitation. No evidence of mitral stenosis.   4. The aortic valve is tricuspid. There is mild calcification of the  aortic valve. There is mild thickening of the aortic valve. Aortic valve  regurgitation is not visualized. Aortic valve sclerosis is present, with  no evidence of aortic valve stenosis.   5. The inferior vena cava is normal in size with greater than 50%  respiratory variability, suggesting right atrial pressure of 3 mmHg.  Past Medical History:  Diagnosis Date   Cirrhosis ([place])    Dysphagia    GERD (gastroesophageal reflux disease)    Hyperlipidemia    Hypertension    Neuromuscular disorder (HCC)    tremor   Steatohepatitis, non-alcoholic    Thrombocytopenia (HCC)     Past Surgical History:  Procedure Laterality Date   CHOLECYSTECTOMY      ESOPHAGOGASTRODUODENOSCOPY (EGD) WITH PROPOFOL Bilateral 08/06/2021   Procedure: ESOPHAGOGASTRODUODENOSCOPY (EGD) WITH PROPOFOL;  Surgeon: Arta Silence, MD;  Location: [facility] ENDOSCOPY;  Service: Endoscopy;  Laterality: Bilateral;   INGUINAL HERNIA REPAIR Right 12/23/2021   Procedure: RIGHT INGUINAL HERNIA REPAIR- OPEN;  Surgeon: Johnathan Hausen, MD;  Location: [facility] ORS;  Service: General;  Laterality: Right;   IR PARACENTESIS  06/16/2021   IR PARACENTESIS  07/11/2021   IR PARACENTESIS  01/29/2022   IR PARACENTESIS  02/12/2022   IR RADIOLOGIST EVAL & MGMT  03/04/2022   STOMACH SURGERY      MEDICATIONS: No current facility-administered medications for this encounter.    b complex vitamins capsule   calcium carbonate (TUMS - DOSED IN MG ELEMENTAL CALCIUM) 500 MG chewable tablet   furosemide (LASIX) 40 MG tablet   lactulose (CHRONULAC) 10 GM/15ML solution   Multiple Vitamin (MULTIVITAMIN WITH MINERALS) TABS tablet   pravastatin (PRAVACHOL) 20 MG tablet   propranolol (INDERAL) 10 MG tablet   rifaximin (XIFAXAN) 550 MG TABS tablet   spironolactone (ALDACTONE) 50 MG tablet   feeding supplement (ENSURE ENLIVE / ENSURE PLUS) LIQD   Nutritional Supplements (,FEEDING SUPPLEMENT, PROSOURCE PLUS) liquid    Myra Gianotti, PA-C Surgical Short Stay/Anesthesiology [facility] Phone [phone] [facility] Phone [phone] 03/13/2022 11:51 AM

## 2022-03-13 NOTE — Progress Notes (Signed)
TWO VISITORS ARE ALLOWED TO COME WITH YOU AND STAY IN THE SURGICAL WAITING ROOM ONLY DURING PRE OP AND PROCEDURE DAY OF SURGERY.   Two VISITORS MAY VISIT WITH YOU AFTER SURGERY IN YOUR PRIVATE ROOM DURING VISITING HOURS ONLY!  PCP - Dr Feliberto Gottron Cardiologist - n/a Gastroenterology - Dr Arta Silence  Chest x-ray - 05/27/21 (1V) EKG - 05/27/21 Stress Test - 12/10/21 ECHO - 03/06/22 Cardiac Cath - n/a  ICD Pacemaker/Loop - n/a  Sleep Study -  n/a CPAP - none  ERAS: Clear liquids til 10:15 AM DOS  Anesthesia review: Yes  STOP now taking any Aspirin (unless otherwise instructed by your surgeon), Aleve, Naproxen, Ibuprofen, Motrin, Advil, Goody's, BC's, all herbal medications, fish oil, and all vitamins.   Coronavirus Screening Does the patient has any of the following symptoms:  Cough yes/no: No Fever (>100.72F)  yes/no: No Runny nose yes/no: No Sore throat yes/no: No Difficulty breathing/shortness of breath  Yes  Have you traveled in the last 14 days and where? yes/no: No  Patient/Wife Izora Gala [phone] verbalized understanding of instructions that were given via phone.

## 2022-03-17 ENCOUNTER — Inpatient Hospital Stay (HOSPITAL_COMMUNITY)
Admission: RE | Admit: 2022-03-17 | Discharge: 2022-03-17 | Disposition: A | Discharge status: Home / Self Care | Payer: Medicare HMO | Source: Ambulatory Visit | Attending: Interventional Radiology | Admitting: Interventional Radiology

## 2022-03-17 ENCOUNTER — Encounter (HOSPITAL_COMMUNITY): Payer: Self-pay | Admitting: Interventional Radiology

## 2022-03-17 ENCOUNTER — Inpatient Hospital Stay (HOSPITAL_COMMUNITY): Payer: Medicare HMO | Admitting: Vascular Surgery

## 2022-03-17 ENCOUNTER — Encounter (HOSPITAL_COMMUNITY)
Admission: RE | Disposition: A | Discharge status: Home / Self Care | Payer: Self-pay | Source: Home / Self Care | Attending: Interventional Radiology

## 2022-03-17 ENCOUNTER — Other Ambulatory Visit: Payer: Self-pay

## 2022-03-17 ENCOUNTER — Inpatient Hospital Stay (HOSPITAL_COMMUNITY)
Admission: RE | Admit: 2022-03-17 | Discharge: 2022-03-17 | DRG: 442 | Disposition: A | Discharge status: Home / Self Care | Payer: Medicare HMO | Attending: Interventional Radiology | Admitting: Interventional Radiology

## 2022-03-17 ENCOUNTER — Inpatient Hospital Stay (HOSPITAL_COMMUNITY): Payer: Medicare HMO

## 2022-03-17 DIAGNOSIS — K766 Portal hypertension: Secondary | ICD-10-CM

## 2022-03-17 DIAGNOSIS — R188 Other ascites: Secondary | ICD-10-CM

## 2022-03-17 DIAGNOSIS — J90 Pleural effusion, not elsewhere classified: Secondary | ICD-10-CM

## 2022-03-17 DIAGNOSIS — E785 Hyperlipidemia, unspecified: Secondary | ICD-10-CM | POA: Diagnosis not present

## 2022-03-17 DIAGNOSIS — J948 Other specified pleural conditions: Secondary | ICD-10-CM | POA: Diagnosis present

## 2022-03-17 DIAGNOSIS — I10 Essential (primary) hypertension: Secondary | ICD-10-CM | POA: Diagnosis not present

## 2022-03-17 DIAGNOSIS — K219 Gastro-esophageal reflux disease without esophagitis: Secondary | ICD-10-CM | POA: Diagnosis present

## 2022-03-17 DIAGNOSIS — Z79899 Other long term (current) drug therapy: Secondary | ICD-10-CM | POA: Diagnosis not present

## 2022-03-17 DIAGNOSIS — Z538 Procedure and treatment not carried out for other reasons: Secondary | ICD-10-CM | POA: Diagnosis not present

## 2022-03-17 DIAGNOSIS — K7031 Alcoholic cirrhosis of liver with ascites: Secondary | ICD-10-CM

## 2022-03-17 DIAGNOSIS — Z5309 Procedure and treatment not carried out because of other contraindication: Secondary | ICD-10-CM | POA: Diagnosis not present

## 2022-03-17 DIAGNOSIS — J9811 Atelectasis: Secondary | ICD-10-CM | POA: Diagnosis not present

## 2022-03-17 HISTORY — PX: RADIOLOGY WITH ANESTHESIA: SHX6223

## 2022-03-17 HISTORY — PX: IR THORACENTESIS ASP PLEURAL SPACE W/IMG GUIDE: IMG5380

## 2022-03-17 HISTORY — PX: IR US GUIDE VASC ACCESS RIGHT: IMG2390

## 2022-03-17 HISTORY — PX: IR TIPS: IMG2295

## 2022-03-17 HISTORY — PX: IR PARACENTESIS: IMG2679

## 2022-03-17 LAB — CBC
HCT: 42.8 % (ref 39.0–52.0)
Hemoglobin: 14.9 g/dL (ref 13.0–17.0)
MCH: 34.8 pg — ABNORMAL HIGH (ref 26.0–34.0)
MCHC: 34.8 g/dL (ref 30.0–36.0)
MCV: 100 fL (ref 80.0–100.0)
Platelets: UNDETERMINED 10*3/uL (ref 150–400)
RBC: 4.28 MIL/uL (ref 4.22–5.81)
RDW: 15.4 % (ref 11.5–15.5)
WBC: 6.6 10*3/uL (ref 4.0–10.5)
nRBC: 0 % (ref 0.0–0.2)

## 2022-03-17 LAB — COMPREHENSIVE METABOLIC PANEL
ALT: 74 U/L — ABNORMAL HIGH (ref 0–44)
AST: 90 U/L — ABNORMAL HIGH (ref 15–41)
Albumin: 3 g/dL — ABNORMAL LOW (ref 3.5–5.0)
Alkaline Phosphatase: 88 U/L (ref 38–126)
Anion gap: 11 (ref 5–15)
BUN: 12 mg/dL (ref 8–23)
CO2: 24 mmol/L (ref 22–32)
Calcium: 9.3 mg/dL (ref 8.9–10.3)
Chloride: 98 mmol/L (ref 98–111)
Creatinine, Ser: 0.98 mg/dL (ref 0.61–1.24)
GFR, Estimated: >60 mL/min (ref >60)
Glucose, Bld: 123 mg/dL — ABNORMAL HIGH (ref 70–99)
Potassium: 3 mmol/L — ABNORMAL LOW (ref 3.5–5.1)
Sodium: 133 mmol/L — ABNORMAL LOW (ref 135–145)
Total Bilirubin: 6.3 mg/dL — ABNORMAL HIGH (ref 0.3–1.2)
Total Protein: 6.2 g/dL — ABNORMAL LOW (ref 6.5–8.1)

## 2022-03-17 LAB — TYPE AND SCREEN
ABO/RH(D): A POS
Antibody Screen: NEGATIVE

## 2022-03-17 LAB — APTT: aPTT: 33 seconds (ref 24–36)

## 2022-03-17 LAB — PROTIME-INR
INR: 1.7 — ABNORMAL HIGH (ref 0.8–1.2)
Prothrombin Time: 19.7 seconds — ABNORMAL HIGH (ref 11.4–15.2)

## 2022-03-17 LAB — PLATELET COUNT: Platelets: UNDETERMINED 10*3/uL (ref 150–400)

## 2022-03-17 LAB — ABO/RH: ABO/RH(D): A POS

## 2022-03-17 LAB — IMMATURE PLATELET FRACTION: Immature Platelet Fraction: 4.3 % (ref 1.2–8.6)

## 2022-03-17 SURGERY — IR WITH ANESTHESIA
Anesthesia: General

## 2022-03-17 MED ORDER — FENTANYL CITRATE (PF) 100 MCG/2ML IJ SOLN: 25.0000 ug | INTRAMUSCULAR | Status: DC | PRN

## 2022-03-17 MED ORDER — ALBUMIN HUMAN 5 % IV SOLN
INTRAVENOUS | Status: AC
  Filled 2022-03-17: qty 250

## 2022-03-17 MED ORDER — SODIUM CHLORIDE 0.9 % IV SOLN
2.0000 g | Freq: Once | INTRAVENOUS | Status: AC
  Administered 2022-03-17: 2 g via INTRAVENOUS
  Filled 2022-03-17: qty 20

## 2022-03-17 MED ORDER — PROPOFOL 10 MG/ML IV BOLUS
INTRAVENOUS | Status: DC | PRN
  Administered 2022-03-17: 100 mg via INTRAVENOUS

## 2022-03-17 MED ORDER — FENTANYL CITRATE (PF) 250 MCG/5ML IJ SOLN
INTRAMUSCULAR | Status: DC | PRN
  Administered 2022-03-17: 50 ug via INTRAVENOUS

## 2022-03-17 MED ORDER — ALBUMIN HUMAN 5 % IV SOLN: INTRAVENOUS | Status: DC | PRN

## 2022-03-17 MED ORDER — LIDOCAINE HCL 1 % IJ SOLN
INTRAMUSCULAR | Status: AC
  Administered 2022-03-17: 10 mL
  Filled 2022-03-17: qty 20

## 2022-03-17 MED ORDER — LIDOCAINE 2% (20 MG/ML) 5 ML SYRINGE
INTRAMUSCULAR | Status: DC | PRN
  Administered 2022-03-17: 60 mg via INTRAVENOUS

## 2022-03-17 MED ORDER — SUCCINYLCHOLINE CHLORIDE 200 MG/10ML IV SOSY
PREFILLED_SYRINGE | INTRAVENOUS | Status: DC | PRN
  Administered 2022-03-17: 120 mg via INTRAVENOUS

## 2022-03-17 MED ORDER — PHENYLEPHRINE HCL-NACL 20-0.9 MG/250ML-% IV SOLN
INTRAVENOUS | Status: DC | PRN
  Administered 2022-03-17: 75 ug/min via INTRAVENOUS

## 2022-03-17 MED ORDER — SODIUM CHLORIDE 0.9 % IV SOLN: INTRAVENOUS | Status: DC

## 2022-03-17 MED ORDER — IOHEXOL 300 MG/ML  SOLN: 100.0000 mL | Freq: Once | INTRAMUSCULAR | Status: DC | PRN

## 2022-03-17 MED ORDER — SUGAMMADEX SODIUM 200 MG/2ML IV SOLN
INTRAVENOUS | Status: DC | PRN
  Administered 2022-03-17: 200 mg via INTRAVENOUS

## 2022-03-17 MED ORDER — ROCURONIUM BROMIDE 10 MG/ML (PF) SYRINGE
PREFILLED_SYRINGE | INTRAVENOUS | Status: DC | PRN
  Administered 2022-03-17: 30 mg via INTRAVENOUS

## 2022-03-17 MED ORDER — CHLORHEXIDINE GLUCONATE 0.12 % MT SOLN
15.0000 mL | Freq: Once | OROMUCOSAL | Status: AC
  Administered 2022-03-17: 15 mL via OROMUCOSAL
  Filled 2022-03-17: qty 15

## 2022-03-17 MED ORDER — ALBUMIN HUMAN 5 % IV SOLN
12.5000 g | Freq: Once | INTRAVENOUS | Status: AC
  Administered 2022-03-17: 12.5 g via INTRAVENOUS

## 2022-03-17 MED ORDER — ONDANSETRON HCL 4 MG/2ML IJ SOLN: 4.0000 mg | Freq: Once | INTRAMUSCULAR | Status: DC | PRN

## 2022-03-17 MED ORDER — ORAL CARE MOUTH RINSE: 15.0000 mL | Freq: Once | OROMUCOSAL | Status: AC

## 2022-03-17 MED ORDER — ONDANSETRON HCL 4 MG/2ML IJ SOLN
INTRAMUSCULAR | Status: DC | PRN
  Administered 2022-03-17: 4 mg via INTRAVENOUS

## 2022-03-17 MED ORDER — PHENYLEPHRINE 80 MCG/ML (10ML) SYRINGE FOR IV PUSH (FOR BLOOD PRESSURE SUPPORT)
PREFILLED_SYRINGE | INTRAVENOUS | Status: DC | PRN
  Administered 2022-03-17: 160 ug via INTRAVENOUS

## 2022-03-17 NOTE — Anesthesia Postprocedure Evaluation (Signed)
Anesthesia Post Note  Patient: TREA LATNER  Procedure(s) Performed: TIPS     Patient location during evaluation: PACU Anesthesia Type: General Level of consciousness: awake and alert, oriented and patient cooperative Pain management: pain level controlled Vital Signs Assessment: post-procedure vital signs reviewed and stable Respiratory status: spontaneous breathing, nonlabored ventilation and respiratory function stable Cardiovascular status: blood pressure returned to baseline and stable Postop Assessment: no apparent nausea or vomiting Anesthetic complications: no   No notable events documented.  Last Vitals:  Vitals:   03/17/22 1530 03/17/22 1545  BP: (!) 90/43 (!) 91/43  Pulse: 62 60  Resp: 20 19  Temp: 36.6 C   SpO2: 93% 93%    Last Pain:  Vitals:   03/17/22 1530  TempSrc:   PainSc: 0-No pain                 Pervis Hocking

## 2022-03-17 NOTE — Transfer of Care (Signed)
Immediate Anesthesia Transfer of Care Note  Patient: Peter Nichols  Procedure(s) Performed: TIPS  Patient Location: PACU  Anesthesia Type:General  Level of Consciousness: awake, alert  and oriented  Airway & Oxygen Therapy: Patient Spontanous Breathing and Patient connected to nasal cannula oxygen  Post-op Assessment: Report given to RN and Post -op Vital signs reviewed and stable  Post vital signs: Reviewed and stable  Last Vitals:  Vitals Value Taken Time  BP 90/43 03/17/22 1534  Temp    Pulse 60 03/17/22 1537  Resp 17 03/17/22 1537  SpO2 93 % 03/17/22 1537  Vitals shown include unvalidated device data.  Last Pain:  Vitals:   03/17/22 1124  TempSrc:   PainSc: 2          Complications: No notable events documented.

## 2022-03-17 NOTE — H&P (Signed)
Chief Complaint: Recurrent ascites. Upper GI bleeding. Patient presents for TIPS/BRTO and paracentesis  Referring Physician(s): Dr. [name]  Supervising Physician: Mir, Sharen Heck  Patient Status: [facility] - Out-pt  History of Present Illness: Peter Nichols is a 76 y.o. male outpatient. History of HTN, GERD, alcoholic cirrhosis, portal hypertension, hepatic encephalopathy and recurrent ascites. MELD 19, Child-Push class C.  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  This varix is likely resulting it natural porto-systemic shunting, and the etiology of prior encephalopathy. The patient was seen for consultation in the Interventional  [facility] on  5.17.23 with Dr.  Everlean Alstrom The patient was given several different treatment options and the patient decided to pursue TIPS/BRTO for treatment for his upper GI bleeding and recurrent ascites.   Currently without any significant complaints. States he feels full due to ascites build up. Patient alert and laying in bed, calm and comfortable. Denies any fevers, headache, chest pain, SOB, cough, abdominal pain, nausea, vomiting or bleeding. Return precautions and treatment recommendations and follow-up discussed with the patient  who is agreeable with the plan.    Past Medical History:  Diagnosis Date   Cirrhosis ([place])    Dysphagia    no current problems   GERD (gastroesophageal reflux disease)    Hyperlipidemia    Hypertension    Neuromuscular disorder (HCC)    tremor   Steatohepatitis, non-alcoholic    Thrombocytopenia (HCC)     Past Surgical History:  Procedure Laterality Date   CHOLECYSTECTOMY     ESOPHAGOGASTRODUODENOSCOPY (EGD) WITH PROPOFOL Bilateral 08/06/2021   Procedure: ESOPHAGOGASTRODUODENOSCOPY (EGD) WITH PROPOFOL;  Surgeon: Arta Silence, MD;   Location: [facility] ENDOSCOPY;  Service: Endoscopy;  Laterality: Bilateral;   INGUINAL HERNIA REPAIR Right 12/23/2021   Procedure: RIGHT INGUINAL HERNIA REPAIR- OPEN;  Surgeon: Johnathan Hausen, MD;  Location: [facility] ORS;  Service: General;  Laterality: Right;   IR PARACENTESIS  06/16/2021   IR PARACENTESIS  07/11/2021   IR PARACENTESIS  01/29/2022   IR PARACENTESIS  02/12/2022   IR RADIOLOGIST EVAL & MGMT  03/04/2022   STOMACH SURGERY      Allergies: Other  Medications: Prior to Admission medications   Medication Sig Start Date End Date Taking? Authorizing Provider  b complex vitamins capsule Take 1 capsule by mouth daily.   Yes [provider]  calcium carbonate (TUMS - DOSED IN MG ELEMENTAL CALCIUM) 500 MG chewable tablet Chew 1 tablet by mouth daily as needed for indigestion or heartburn.   Yes [provider]  furosemide (LASIX) 40 MG tablet Take 40 mg by mouth daily. 01/09/22  Yes [provider]  lactulose (CHRONULAC) 10 GM/15ML solution Take 45 mLs (30 g total) by mouth 3 (three) times daily. 01/02/22  Yes Allie Bossier, MD  Multiple Vitamin (MULTIVITAMIN WITH MINERALS) TABS tablet Take 1 tablet by mouth daily. 05/25/21  Yes Annita Brod, MD  pravastatin (PRAVACHOL) 20 MG tablet Take 3 tablets (60 mg total) by mouth daily. 01/03/22  Yes Allie Bossier, MD  propranolol (INDERAL) 10 MG tablet Take 1 tablet (10 mg total) by mouth 2 (two) times daily. 06/03/21  Yes Elgergawy, Silver Huguenin, MD  rifaximin (XIFAXAN) 550 MG TABS tablet Take 1 tablet (550 mg total) by mouth 2 (two) times daily. 01/02/22  Yes Allie Bossier, MD  spironolactone (ALDACTONE) 50 MG  tablet Take 100 mg by mouth daily. 01/09/22  Yes [provider]  feeding supplement (ENSURE ENLIVE / ENSURE PLUS) LIQD Take 237 mLs by mouth daily. Patient not taking: Reported on 03/09/2022 01/02/22   Allie Bossier, MD  Nutritional Supplements (,FEEDING SUPPLEMENT, PROSOURCE PLUS) liquid Take 30 mLs by mouth 2  (two) times daily between meals. Patient not taking: Reported on 03/09/2022 01/02/22   Allie Bossier, MD     Family History  Problem Relation Age of Onset   Cancer Mother     Social History   Socioeconomic History   Marital status: Married    Spouse name: Not on file   Number of children: Not on file   Years of education: Not on file   Highest education level: Not on file  Occupational History   Not on file  Tobacco Use   Smoking status: Never   Smokeless tobacco: Never  Vaping Use   Vaping Use: Never used  Substance and Sexual Activity   Alcohol use: Not Currently    Comment: none in 8 months on preop visit of 12/10/21   Drug use: Never   Sexual activity: Not Currently  Other Topics Concern   Not on file  Social History Narrative   Not on file   Social Determinants of Health   Financial Resource Strain: Not on file  Food Insecurity: Not on file  Transportation Needs: Not on file  Physical Activity: Not on file  Stress: Not on file  Social Connections: Not on file    Review of Systems: A 12 point ROS discussed and pertinent positives are indicated in the HPI above.  All other systems are negative.  Review of Systems  Constitutional:  Negative for fever.  HENT:  Negative for congestion.   Respiratory:  Negative for cough and shortness of breath.   Cardiovascular:  Negative for chest pain.  Gastrointestinal:  Positive for abdominal distention. Negative for abdominal pain.  Neurological:  Negative for headaches.  Psychiatric/Behavioral:  Negative for behavioral problems and confusion.    Vital Signs: BP 129/71   Pulse (!) 59   Temp 98.8 F (37.1 C) (Oral)   Resp 18   Ht 5' 9"  (1.753 m)   Wt 170 lb (77.1 kg)   SpO2 95%   BMI 25.10 kg/m   Physical Exam Vitals and nursing note reviewed.  Constitutional:      Appearance: He is well-developed.  HENT:     Head: Normocephalic.  Pulmonary:     Effort: Pulmonary effort is normal.  Abdominal:     General:  There is distension.  Musculoskeletal:        General: Normal range of motion.     Cervical back: Normal range of motion.  Skin:    General: Skin is dry.  Neurological:     Mental Status: He is alert and oriented to person, place, and time.    Imaging: US Paracentesis  Result Date: 03/05/2022 INDICATION: Patient with history of alcoholic cirrhosis, portal hypertension, varices, recurrent ascites. Request received for therapeutic paracentesis. EXAM: ULTRASOUND GUIDED THERAPEUTIC PARACENTESIS MEDICATIONS: 8 mL 1% lidocaine COMPLICATIONS: None immediate. PROCEDURE: Informed written consent was obtained from the patient after a discussion of the risks, benefits and alternatives to treatment. A timeout was performed prior to the initiation of the procedure. Initial ultrasound scanning demonstrates a moderate amount of ascites within the right lower abdominal quadrant. The right lower abdomen was prepped and draped in the usual sterile fashion. 1% lidocaine was  used for local anesthesia. Following this, a 19 gauge, 7-cm, Yueh catheter was introduced. An ultrasound image was saved for documentation purposes. The paracentesis was performed. The catheter was removed and a dressing was applied. The patient tolerated the procedure well without immediate post procedural complication. FINDINGS: A total of approximately 3.4 liters of yellow fluid was removed. IMPRESSION: Successful ultrasound-guided therapeutic paracentesis yielding 3.4 liters of peritoneal fluid. Read by: Rowe Robert, PA-C Electronically Signed   By: Markus Daft M.D.   On: 03/05/2022 16:06   ECHOCARDIOGRAM COMPLETE  Result Date: 03/06/2022    ECHOCARDIOGRAM REPORT   Patient Name:   Peter Nichols Date of Exam: 03/06/2022 Medical Rec #:  [id number]       Height:       69.5 in Accession #:    [id number]      Weight:       177.5 lb Date of Birth:  Feb 12, 1946        BSA:          1.973 m Patient Age:    72 years        BP:           125/70 mmHg  Patient Gender: M               HR:           57 bpm. Exam Location:  Outpatient Procedure: 2D Echo, 3D Echo, Cardiac Doppler, Color Doppler and Strain Analysis Indications:    I26.02 Pulmonary embolus; Z01.818 Encounter for other                 preprocedural examination  History:        Patient has no prior history of Echocardiogram examinations.                 Signs/Symptoms:Syncope, Shortness of Breath and Dyspnea; Risk                 Factors:Hypertension and Dyslipidemia. ETOH.  Sonographer:    Roseanna Rainbow RDCS Referring Phys: [id number] [facility] R MIR  Sonographer Comments: Technically difficult study due to poor echo windows. Image acquisition challenging due to respiratory motion. Global longitudinal strain was attempted. IMPRESSIONS  1. Left ventricular ejection fraction, by estimation, is 60 to 65%. The left ventricle has normal function. The left ventricle has no regional wall motion abnormalities. Left ventricular diastolic parameters were normal. The average left ventricular global longitudinal strain is -23.3 %. The global longitudinal strain is normal.  2. Right ventricular systolic function is normal. The right ventricular size is normal.  3. The mitral valve is normal in structure. Trivial mitral valve regurgitation. No evidence of mitral stenosis.  4. The aortic valve is tricuspid. There is mild calcification of the aortic valve. There is mild thickening of the aortic valve. Aortic valve regurgitation is not visualized. Aortic valve sclerosis is present, with no evidence of aortic valve stenosis.  5. The inferior vena cava is normal in size with greater than 50% respiratory variability, suggesting right atrial pressure of 3 mmHg. FINDINGS  Left Ventricle: Left ventricular ejection fraction, by estimation, is 60 to 65%. The left ventricle has normal function. The left ventricle has no regional wall motion abnormalities. The average left ventricular global longitudinal strain is -23.3 %. The global  longitudinal strain is normal. The left ventricular internal cavity size was normal in size. There is no left ventricular hypertrophy. Left ventricular diastolic parameters were normal. Right Ventricle: The right ventricular size is normal.  No increase in right ventricular wall thickness. Right ventricular systolic function is normal. Left Atrium: Left atrial size was normal in size. Right Atrium: Right atrial size was normal in size. Pericardium: There is no evidence of pericardial effusion. Mitral Valve: The mitral valve is normal in structure. Trivial mitral valve regurgitation. No evidence of mitral valve stenosis. Tricuspid Valve: The tricuspid valve is normal in structure. Tricuspid valve regurgitation is trivial. No evidence of tricuspid stenosis. Aortic Valve: The aortic valve is tricuspid. There is mild calcification of the aortic valve. There is mild thickening of the aortic valve. Aortic valve regurgitation is not visualized. Aortic valve sclerosis is present, with no evidence of aortic valve stenosis. Pulmonic Valve: The pulmonic valve was normal in structure. Pulmonic valve regurgitation is not visualized. No evidence of pulmonic stenosis. Aorta: The aortic root is normal in size and structure. Venous: The inferior vena cava is normal in size with greater than 50% respiratory variability, suggesting right atrial pressure of 3 mmHg. IAS/Shunts: No atrial level shunt detected by color flow Doppler.  LEFT VENTRICLE PLAX 2D LVIDd:         4.50 cm     Diastology LVIDs:         2.60 cm     LV e' medial:    5.98 cm/s LV PW:         0.80 cm     LV E/e' medial:  10.5 LV IVS:        0.80 cm     LV e' lateral:   5.50 cm/s LVOT diam:     2.30 cm     LV E/e' lateral: 11.4 LV SV:         74 LV SV Index:   37          2D Longitudinal Strain LVOT Area:     4.15 cm    2D Strain GLS Avg:     -23.3 %  LV Volumes (MOD) LV vol d, MOD A2C: 68.1 ml 3D Volume EF: LV vol d, MOD A4C: 64.8 ml 3D EF:        64 % LV vol s, MOD  A2C: 22.8 ml LV EDV:       63 ml LV vol s, MOD A4C: 25.9 ml LV ESV:       23 ml LV SV MOD A2C:     45.3 ml LV SV:        41 ml LV SV MOD A4C:     64.8 ml LV SV MOD BP:      41.3 ml RIGHT VENTRICLE             IVC RV S prime:     18.10 cm/s  IVC diam: 1.40 cm TAPSE (M-mode): 3.6 cm LEFT ATRIUM             Index        RIGHT ATRIUM           Index LA diam:        2.60 cm 1.32 cm/m   RA Area:     12.80 cm LA Vol (A2C):   21.5 ml 10.89 ml/m  RA Volume:   31.30 ml  15.86 ml/m LA Vol (A4C):   17.1 ml 8.67 ml/m LA Biplane Vol: 21.3 ml 10.79 ml/m  AORTIC VALVE LVOT Vmax:   87.00 cm/s LVOT Vmean:  53.100 cm/s LVOT VTI:    0.177 m  AORTA Ao Root diam: 3.70 cm Ao Asc diam:  3.40 cm MITRAL VALVE MV Area (PHT): 2.62 cm    SHUNTS MV Decel Time: 289 msec    Systemic VTI:  0.18 m MV E velocity: 62.60 cm/s  Systemic Diam: 2.30 cm MV A velocity: 74.10 cm/s MV E/A ratio:  0.84 Jenkins Rouge MD Electronically signed by Jenkins Rouge MD Signature Date/Time: 03/06/2022/11:55:22 AM    Final    IR Radiologist Eval & Mgmt  Result Date: 03/04/2022 Please refer to notes tab for details about interventional procedure. (Op Note)  US Abdomen Limited RUQ (LIVER/GB)  Result Date: 02/17/2022 CLINICAL DATA:  Patient with history of cirrhosis. EXAM: ULTRASOUND ABDOMEN LIMITED RIGHT UPPER QUADRANT COMPARISON:  Ultrasound abdomen 10/10/2021 FINDINGS: Gallbladder: Surgically absent Common bile duct: Diameter: 2 mm Liver: Morphologic changes to the liver compatible with cirrhosis. There is a 1.6 x 1.2 x 1.5 cm cyst within the liver. Portal vein is patent on color Doppler imaging with normal direction of blood flow towards the liver. Other: Right upper quadrant ascites.  Small right pleural effusion. IMPRESSION: Morphologic changes to the liver compatible with cirrhosis. Small hepatic cyst. Prior cholecystectomy. Ascites and right pleural effusion. Electronically Signed   By: Lovey Newcomer M.D.   On: 02/17/2022 14:33    Labs:  CBC: Recent  Labs    12/23/21 1208 12/31/21 1617 01/01/22 0351 01/02/22 0351  WBC 10.3 6.3 4.5 4.7  HGB 13.8 13.7 12.0* 12.0*  HCT 40.9 39.3 35.1* 35.7*  PLT 116* 105* 75* 76*    COAGS: Recent Labs    05/27/21 1634 11/07/21 1241 12/10/21 1245 12/23/21 1208  INR 1.5* 1.6* 1.6* 1.7*  APTT 31  --  35 34    BMP: Recent Labs    12/23/21 1208 12/31/21 1617 01/01/22 0351 01/02/22 0351  NA 130* 136 137 136  K 4.8 3.4* 3.6 3.8  CL 102 106 105 104  CO2 23 24 24 24   GLUCOSE 122* 182* 159* 119*  BUN 20 15 14 15   CALCIUM 8.4* 8.7* 8.6* 8.4*  CREATININE 1.07 0.84 0.78 0.73  GFRNONAA >60 >60 >60 >60    LIVER FUNCTION TESTS: Recent Labs    12/23/21 1208 12/31/21 1617 01/01/22 0351 01/02/22 0351  BILITOT 7.8* 5.7* 4.7* 5.5*  AST 41 43* 39 34  ALT 26 39 33 31  ALKPHOS 67 73 55 55  PROT 5.9* 5.9* 5.1* 5.1*  ALBUMIN 2.7* 2.8* 2.5* 2.5*      Assessment and Plan: 76 y.o. male outpatient. History of HTN, GERD, alcoholic cirrhosis, portal hypertension, hepatic encephalopathy and recurrent ascites. MELD 19, Child-Push class C.  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  This varix is likely resulting it natural porto-systemic shunting, and the etiology of prior encephalopathy. The patient was seen for consultation in the Interventional  Portal Hypertension Radiology Clinic on  5.17.23 with Dr.  Everlean Alstrom The patient was given several different treatment options and the patient decided to pursue TIPS/BRTO for treatment for his upper GI bleeding and recurrent ascites.   Labs pending. All medications are within acceptable parameters. No pertinent allergies. Patient has been NPO since midnight.   Risks and benefits of TIPS, BRTO and/or additional variceal embolization were discussed with the patient and/or the patient's family including, but not  limited to, infection, bleeding, damage to adjacent structures, worsening hepatic and/or cardiac function, worsening and/or the development of altered mental  status/encephalopathy, non-target embolization and death.   This interventional procedure involves the use of X-rays and because of the nature of the planned procedure, it is possible that we will have prolonged use of X-ray fluoroscopy.  Potential radiation risks to you include (but are not limited to) the following: - A slightly elevated risk for cancer  several years later in life. This risk is typically less than 0.5% percent. This risk is low in comparison to the normal incidence of human cancer, which is 33% for women and 50% for men according to the [place]. - Radiation induced injury can include skin redness, resembling a rash, tissue breakdown / ulcers and hair loss (which can be temporary or permanent).   The likelihood of either of these occurring depends on the difficulty of the procedure and whether you are sensitive to radiation due to previous procedures, disease, or genetic conditions.   IF your procedure requires a prolonged use of radiation, you will be notified and given written instructions for further action.  It is your responsibility to monitor the irradiated area for the 2 weeks following the procedure and to notify your physician if you are concerned that you have suffered a radiation induced injury.    All of the patient's questions were answered, patient is agreeable to proceed.  Consent signed and in chart.     Thank you for this interesting consult.  I greatly enjoyed meeting Peter Nichols and look forward to participating in their care.  A copy of this report was sent to the requesting provider on this date.  Electronically Signed: Jacqualine Mau, NP 03/17/2022, 11:11 AM   I spent a total of  40 Minutes   in face to face in clinical consultation, greater than 50% of which was  counseling/coordinating care for TIPS

## 2022-03-17 NOTE — Anesthesia Procedure Notes (Signed)
Procedure Name: Intubation Date/Time: 03/17/2022 1:05 PM Performed by: Eligha Bridegroom, CRNA Pre-anesthesia Checklist: Patient identified, Emergency Drugs available, Patient being monitored and Timeout performed Patient Re-evaluated:Patient Re-evaluated prior to induction Oxygen Delivery Method: Circle system utilized Preoxygenation: Pre-oxygenation with 100% oxygen Induction Type: IV induction and Cricoid Pressure applied Laryngoscope Size: Mac and 4 Grade View: Grade I Tube type: Oral Tube size: 7.5 mm Number of attempts: 1 Airway Equipment and Method: Stylet Placement Confirmation: ETT inserted through vocal cords under direct vision, positive ETCO2 and breath sounds checked- equal and bilateral Secured at: 22 cm Tube secured with: Tape Dental Injury: Teeth and Oropharynx as per pre-operative assessment  Future Recommendations: Recommend- induction with short-acting agent, and alternative techniques readily available

## 2022-03-17 NOTE — Anesthesia Procedure Notes (Signed)
Arterial Line Insertion Start/End5/30/2023 12:25 PM Performed by: CRNA  Patient location: Pre-op. Preanesthetic checklist: patient identified, IV checked, risks and benefits discussed, surgical consent, pre-op evaluation and timeout performed Lidocaine 1% used for infiltration Left, radial was placed Catheter size: 20 G Maximum sterile barriers used   Attempts: 2 Procedure performed without using ultrasound guided technique. Following insertion, dressing applied and Biopatch. Post procedure assessment: normal  Patient tolerated the procedure well with no immediate complications.

## 2022-03-17 NOTE — Sedation Documentation (Signed)
Pre-Right Atrial Pressure: 23/18 (19)

## 2022-03-17 NOTE — Sedation Documentation (Signed)
Pt transported to PACU bay 2 via stretcher accompanied by RN and CRNA. Mackenzie RN at bedside to receive pt. Handoff completed. Right neck, chest, abdomen and groin sites unremarkable with dressings in place. Pt awake alert and oriented. No complaints at this time. No s/s of distress.

## 2022-03-17 NOTE — Procedures (Signed)
Interventional Radiology Procedure Note  Procedure:  RUQ paracentesis Right thoracentesis Aborted TIPS procedure  Indication:  Ascites Right pleural effusion Portal Hypertension  Findings: Please refer to procedural dictation for full description.  Complications: None  EBL: < 10 mL  Miachel Roux, MD [phone]

## 2022-03-18 ENCOUNTER — Encounter (HOSPITAL_COMMUNITY): Payer: Self-pay | Admitting: Interventional Radiology

## 2022-03-20 ENCOUNTER — Other Ambulatory Visit: Payer: Self-pay | Admitting: Interventional Radiology

## 2022-03-20 DIAGNOSIS — K7031 Alcoholic cirrhosis of liver with ascites: Secondary | ICD-10-CM | POA: Diagnosis not present

## 2022-03-20 DIAGNOSIS — R188 Other ascites: Secondary | ICD-10-CM

## 2022-03-20 NOTE — Discharge Summary (Addendum)
Patient ID: Peter Nichols MRN: [id number] DOB/AGE: 11/24/1945 76 y.o.  Admit date: 03/17/2022 Discharge date: 03/17/22  Supervising Physician: Mir, Sharen Heck  Patient Status: Discharge from PACU  Admission Diagnoses: Cirrhosis; portal hypertension   Discharge Diagnoses: Cirrhosis; portal hypertension   Discharged Condition: good  Hospital Course:   Peter Nichols, 76 year old male, has a medical history significant for HTN, alcoholic cirrhosis, portal hypertension, hepatic encephalopathy, large left retroperitoneal varix and recurrent ascites. He presented to the [facility] Interventional Radiology department 03/17/22 for an elective TIPS/BRTO procedure with planned overnight observation.  Under general anesthesia the IR team gained vascular access and discovered the patient's right heart pressures were elevated with a mean of 20 mm Hg. He underwent a paracentesis and a right thoracentesis but right heart pressures remained elevated at 16 mm Hg. The procedure was terminated and he was extubated/transferred to PACU. He was discharged home in good condition after an appropriate recovery time.   Consults: General Anesthesia   Significant Diagnostic Studies: US Paracentesis  Result Date: 03/05/2022 INDICATION: Patient with history of alcoholic cirrhosis, portal hypertension, varices, recurrent ascites. Request received for therapeutic paracentesis. EXAM: ULTRASOUND GUIDED THERAPEUTIC PARACENTESIS MEDICATIONS: 8 mL 1% lidocaine COMPLICATIONS: None immediate. PROCEDURE: Informed written consent was obtained from the patient after a discussion of the risks, benefits and alternatives to treatment. A timeout was performed prior to the initiation of the procedure. Initial ultrasound scanning demonstrates a moderate amount of ascites within the right lower abdominal quadrant. The right lower abdomen was prepped and draped in the usual sterile fashion. 1% lidocaine was used for local anesthesia.  Following this, a 19 gauge, 7-cm, Yueh catheter was introduced. An ultrasound image was saved for documentation purposes. The paracentesis was performed. The catheter was removed and a dressing was applied. The patient tolerated the procedure well without immediate post procedural complication. FINDINGS: A total of approximately 3.4 liters of yellow fluid was removed. IMPRESSION: Successful ultrasound-guided therapeutic paracentesis yielding 3.4 liters of peritoneal fluid. Read by: Rowe Robert, PA-C Electronically Signed   By: Markus Daft M.D.   On: 03/05/2022 16:06   IR Tips  Result Date: 03/17/2022 CLINICAL DATA:  76 year old gentleman with history of recurrent ascites, newly developed hepatic hydrothorax, and large left retroperitoneal varix presented to IR for TIPS. EXAM: 1. Ultrasound-guided access of right internal jugular vein and common femoral vein. 2. Right heart pressure measurements. 3. ICE evaluation of hepatic vascular anatomy MEDICATIONS: As antibiotic prophylaxis, Rocephin 1 gm IV was ordered pre-procedure and administered intravenously within one hour of incision. One unit of platelets was also administered IV during the procedure. ANESTHESIA/SEDATION: General - as administered by the Anesthesia department CONTRAST:  None FLUOROSCOPY: Radiation Exposure Index (as provided by the fluoroscopic device): 17 mGy Kerma COMPLICATIONS: None immediate. PROCEDURE: Informed written consent was obtained from the patient after a thorough discussion of the procedural risks, benefits and alternatives. All questions were addressed. Maximal Sterile Barrier Technique was utilized including caps, mask, sterile gowns, sterile gloves, sterile drape, hand hygiene and skin antiseptic. A timeout was performed prior to the initiation of the procedure. Patient positioned supine on the procedure table. The right groin and neck skin prepped and draped usual fashion. Ultrasound image documenting patency of the right common  femoral vein was obtained and placed in permanent medical record. Sterile ultrasound probe cover and gel utilized throughout the procedure. Utilizing continuous ultrasound guidance, the right common femoral vein was accessed at the level of the femoral head with a 21 gauge needle.  21 gauge needle exchanged for a transitional dilator set over 0.018 inch guidewire. Transitional dilator set exchanged for 8 French sheath over 0.035 inch guidewire. ICE catheter was advanced to the level of the intrahepatic IVC. Ultrasound evaluation demonstrated patent hepatic and portal vein. Ultrasound image documenting patency of the right internal jugular vein was obtained and placed in permanent medical record. Sterile ultrasound probe cover and gel utilized throughout the procedure. Utilizing continuous ultrasound guidance, the right internal jugular vein with a 21 gauge needle. 21 gauge needle exchanged for a transitional dilator set over 0.018 inch guidewire. Transitional dilator set exchanged for 12 French sheath over 0.035 inch guidewire, after serial dilation. Sheath advanced to the level of the right atrium. Right heart pressures were elevated measuring mean of 20 mm Hg. Paracentesis and right thoracentesis were performed, however the right heart pressure remained elevated around 16 mm Hg. Given elevated right heart pressure, procedure was terminated. IMPRESSION: TIPS aborted due to elevated right heart pressures. Patient will be admitted to the hospitalist service for overnight observation. Outpatient follow-up with a TIPS ultrasound in 1 month. Electronically Signed   By: Miachel Roux M.D.   On: 03/17/2022 17:34   IR US Guide Vasc Access Right  Result Date: 03/17/2022 CLINICAL DATA:  76 year old gentleman with history of recurrent ascites, newly developed hepatic hydrothorax, and large left retroperitoneal varix presented to IR for TIPS. EXAM: 1. Ultrasound-guided access of right internal jugular vein and common femoral  vein. 2. Right heart pressure measurements. 3. ICE evaluation of hepatic vascular anatomy MEDICATIONS: As antibiotic prophylaxis, Rocephin 1 gm IV was ordered pre-procedure and administered intravenously within one hour of incision. One unit of platelets was also administered IV during the procedure. ANESTHESIA/SEDATION: General - as administered by the Anesthesia department CONTRAST:  None FLUOROSCOPY: Radiation Exposure Index (as provided by the fluoroscopic device): 17 mGy Kerma COMPLICATIONS: None immediate. PROCEDURE: Informed written consent was obtained from the patient after a thorough discussion of the procedural risks, benefits and alternatives. All questions were addressed. Maximal Sterile Barrier Technique was utilized including caps, mask, sterile gowns, sterile gloves, sterile drape, hand hygiene and skin antiseptic. A timeout was performed prior to the initiation of the procedure. Patient positioned supine on the procedure table. The right groin and neck skin prepped and draped usual fashion. Ultrasound image documenting patency of the right common femoral vein was obtained and placed in permanent medical record. Sterile ultrasound probe cover and gel utilized throughout the procedure. Utilizing continuous ultrasound guidance, the right common femoral vein was accessed at the level of the femoral head with a 21 gauge needle. 21 gauge needle exchanged for a transitional dilator set over 0.018 inch guidewire. Transitional dilator set exchanged for 8 French sheath over 0.035 inch guidewire. ICE catheter was advanced to the level of the intrahepatic IVC. Ultrasound evaluation demonstrated patent hepatic and portal vein. Ultrasound image documenting patency of the right internal jugular vein was obtained and placed in permanent medical record. Sterile ultrasound probe cover and gel utilized throughout the procedure. Utilizing continuous ultrasound guidance, the right internal jugular vein with a 21 gauge  needle. 21 gauge needle exchanged for a transitional dilator set over 0.018 inch guidewire. Transitional dilator set exchanged for 12 French sheath over 0.035 inch guidewire, after serial dilation. Sheath advanced to the level of the right atrium. Right heart pressures were elevated measuring mean of 20 mm Hg. Paracentesis and right thoracentesis were performed, however the right heart pressure remained elevated around 16 mm Hg. Given elevated  right heart pressure, procedure was terminated. IMPRESSION: TIPS aborted due to elevated right heart pressures. Patient will be admitted to the hospitalist service for overnight observation. Outpatient follow-up with a TIPS ultrasound in 1 month. Electronically Signed   By: Miachel Roux M.D.   On: 03/17/2022 17:34   DG Chest Port 1 View  Result Date: 03/17/2022 CLINICAL DATA:  Status post right-sided thoracentesis. EXAM: PORTABLE CHEST 1 VIEW COMPARISON:  Chest x-ray dated May 27, 2021. FINDINGS: Normal heart size. Moderate right pleural effusion with right middle and lower lobe collapse. Mild left basilar atelectasis. No pneumothorax. No acute osseous abnormality. IMPRESSION: 1. Moderate right pleural effusion.  No pneumothorax. Electronically Signed   By: Titus Dubin M.D.   On: 03/17/2022 16:04   ECHOCARDIOGRAM COMPLETE  Result Date: 03/06/2022    ECHOCARDIOGRAM REPORT   Patient Name:   KEMARI NAREZ Date of Exam: 03/06/2022 Medical Rec #:  [id number]       Height:       69.5 in Accession #:    [id number]      Weight:       177.5 lb Date of Birth:  Oct 26, 1945        BSA:          1.973 m Patient Age:    44 years        BP:           125/70 mmHg Patient Gender: M               HR:           57 bpm. Exam Location:  Outpatient Procedure: 2D Echo, 3D Echo, Cardiac Doppler, Color Doppler and Strain Analysis Indications:    I26.02 Pulmonary embolus; Z01.818 Encounter for other                 preprocedural examination  History:        Patient has no prior history of  Echocardiogram examinations.                 Signs/Symptoms:Syncope, Shortness of Breath and Dyspnea; Risk                 Factors:Hypertension and Dyslipidemia. ETOH.  Sonographer:    Roseanna Rainbow RDCS Referring Phys: [id number] [facility] R MIR  Sonographer Comments: Technically difficult study due to poor echo windows. Image acquisition challenging due to respiratory motion. Global longitudinal strain was attempted. IMPRESSIONS  1. Left ventricular ejection fraction, by estimation, is 60 to 65%. The left ventricle has normal function. The left ventricle has no regional wall motion abnormalities. Left ventricular diastolic parameters were normal. The average left ventricular global longitudinal strain is -23.3 %. The global longitudinal strain is normal.  2. Right ventricular systolic function is normal. The right ventricular size is normal.  3. The mitral valve is normal in structure. Trivial mitral valve regurgitation. No evidence of mitral stenosis.  4. The aortic valve is tricuspid. There is mild calcification of the aortic valve. There is mild thickening of the aortic valve. Aortic valve regurgitation is not visualized. Aortic valve sclerosis is present, with no evidence of aortic valve stenosis.  5. The inferior vena cava is normal in size with greater than 50% respiratory variability, suggesting right atrial pressure of 3 mmHg. FINDINGS  Left Ventricle: Left ventricular ejection fraction, by estimation, is 60 to 65%. The left ventricle has normal function. The left ventricle has no regional wall motion abnormalities. The average left ventricular global longitudinal strain  is -23.3 %. The global longitudinal strain is normal. The left ventricular internal cavity size was normal in size. There is no left ventricular hypertrophy. Left ventricular diastolic parameters were normal. Right Ventricle: The right ventricular size is normal. No increase in right ventricular wall thickness. Right ventricular systolic function  is normal. Left Atrium: Left atrial size was normal in size. Right Atrium: Right atrial size was normal in size. Pericardium: There is no evidence of pericardial effusion. Mitral Valve: The mitral valve is normal in structure. Trivial mitral valve regurgitation. No evidence of mitral valve stenosis. Tricuspid Valve: The tricuspid valve is normal in structure. Tricuspid valve regurgitation is trivial. No evidence of tricuspid stenosis. Aortic Valve: The aortic valve is tricuspid. There is mild calcification of the aortic valve. There is mild thickening of the aortic valve. Aortic valve regurgitation is not visualized. Aortic valve sclerosis is present, with no evidence of aortic valve stenosis. Pulmonic Valve: The pulmonic valve was normal in structure. Pulmonic valve regurgitation is not visualized. No evidence of pulmonic stenosis. Aorta: The aortic root is normal in size and structure. Venous: The inferior vena cava is normal in size with greater than 50% respiratory variability, suggesting right atrial pressure of 3 mmHg. IAS/Shunts: No atrial level shunt detected by color flow Doppler.  LEFT VENTRICLE PLAX 2D LVIDd:         4.50 cm     Diastology LVIDs:         2.60 cm     LV e' medial:    5.98 cm/s LV PW:         0.80 cm     LV E/e' medial:  10.5 LV IVS:        0.80 cm     LV e' lateral:   5.50 cm/s LVOT diam:     2.30 cm     LV E/e' lateral: 11.4 LV SV:         74 LV SV Index:   37          2D Longitudinal Strain LVOT Area:     4.15 cm    2D Strain GLS Avg:     -23.3 %  LV Volumes (MOD) LV vol d, MOD A2C: 68.1 ml 3D Volume EF: LV vol d, MOD A4C: 64.8 ml 3D EF:        64 % LV vol s, MOD A2C: 22.8 ml LV EDV:       63 ml LV vol s, MOD A4C: 25.9 ml LV ESV:       23 ml LV SV MOD A2C:     45.3 ml LV SV:        41 ml LV SV MOD A4C:     64.8 ml LV SV MOD BP:      41.3 ml RIGHT VENTRICLE             IVC RV S prime:     18.10 cm/s  IVC diam: 1.40 cm TAPSE (M-mode): 3.6 cm LEFT ATRIUM             Index        RIGHT  ATRIUM           Index LA diam:        2.60 cm 1.32 cm/m   RA Area:     12.80 cm LA Vol (A2C):   21.5 ml 10.89 ml/m  RA Volume:   31.30 ml  15.86 ml/m LA Vol (A4C):   17.1 ml 8.67  ml/m LA Biplane Vol: 21.3 ml 10.79 ml/m  AORTIC VALVE LVOT Vmax:   87.00 cm/s LVOT Vmean:  53.100 cm/s LVOT VTI:    0.177 m  AORTA Ao Root diam: 3.70 cm Ao Asc diam:  3.40 cm MITRAL VALVE MV Area (PHT): 2.62 cm    SHUNTS MV Decel Time: 289 msec    Systemic VTI:  0.18 m MV E velocity: 62.60 cm/s  Systemic Diam: 2.30 cm MV A velocity: 74.10 cm/s MV E/A ratio:  0.84 Jenkins Rouge MD Electronically signed by Jenkins Rouge MD Signature Date/Time: 03/06/2022/11:55:22 AM    Final    IR Radiologist Eval & Mgmt  Result Date: 03/04/2022 Please refer to notes tab for details about interventional procedure. (Op Note)  IR Paracentesis  Result Date: 03/17/2022 INDICATION: Recurrent ascites EXAM: ULTRASOUND GUIDED  PARACENTESIS MEDICATIONS: None. COMPLICATIONS: None immediate. PROCEDURE: Informed written consent was obtained from the patient after a discussion of the risks, benefits and alternatives to treatment. A timeout was performed prior to the initiation of the procedure. Initial ultrasound scanning demonstrates a moderate amount of ascites within the right lower abdominal quadrant. The right lower abdomen was prepped and draped in the usual sterile fashion. 1% lidocaine was used for local anesthesia. Following this, a 19 gauge, 7-cm, Yueh catheter was introduced. An ultrasound image was saved for documentation purposes. The paracentesis was performed. The catheter was removed and a dressing was applied. The patient tolerated the procedure well without immediate post procedural complication. FINDINGS: A total of approximately 2.9 L of straw-colored fluid was removed. IMPRESSION: Successful ultrasound-guided paracentesis yielding 2.9 liters of peritoneal fluid. Electronically Signed   By: Miachel Roux M.D.   On: 03/17/2022 16:51   IR  THORACENTESIS ASP PLEURAL SPACE W/IMG GUIDE  Result Date: 03/17/2022 INDICATION: Right hepatic hydrothorax EXAM: ULTRASOUND GUIDED RIGHT THORACENTESIS MEDICATIONS: None. COMPLICATIONS: None immediate. PROCEDURE: An ultrasound guided thoracentesis was thoroughly discussed with the patient and questions answered. The benefits, risks, alternatives and complications were also discussed. The patient understands and wishes to proceed with the procedure. Written consent was obtained. Ultrasound was performed to localize and mark an adequate pocket of fluid in the right chest. The area was then prepped and draped in the normal sterile fashion. 1% Lidocaine was used for local anesthesia. Under ultrasound guidance a 19 gauge, 7-cm, Yueh catheter was introduced. Thoracentesis was performed. The catheter was removed and a dressing applied. FINDINGS: A total of approximately 1.9 L of straw-colored fluid was removed. IMPRESSION: Successful ultrasound guided right thoracentesis yielding 1.9 L of pleural fluid. Electronically Signed   By: Miachel Roux M.D.   On: 03/17/2022 17:26    Treatments: PACU observation  Discharge Exam: Blood pressure (!) 95/46, pulse (!) 58, temperature 97.8 F (36.6 C), resp. rate 16, height 5' 9"  (1.753 m), weight 170 lb (77.1 kg), SpO2 93 %.  Physical Exam Constitutional:      General: He is not in acute distress.    Comments: Physical exam findings obtained from Anesthesia and discharge RN notes/flow sheet.   Cardiovascular:     Comments: Right femoral and right IJ vascular sites clean, soft, dry and intact.  Pulmonary:     Effort: Pulmonary effort is normal.  Neurological:     Mental Status: He is alert and oriented to person, place, and time.    Disposition: Discharge disposition: 01-Home or Self Care   Allergies as of 03/17/2022       Reactions   Other Nausea And Vomiting   Ensure  Medication List     ASK your doctor about these medications    (feeding  supplement) PROSource Plus liquid Take 30 mLs by mouth 2 (two) times daily between meals.   feeding supplement Liqd Take 237 mLs by mouth daily.   b complex vitamins capsule Take 1 capsule by mouth daily.   calcium carbonate 500 MG chewable tablet Commonly known as: TUMS - dosed in mg elemental calcium Chew 1 tablet by mouth daily as needed for indigestion or heartburn.   furosemide 40 MG tablet Commonly known as: LASIX Take 40 mg by mouth daily.   lactulose 10 GM/15ML solution Commonly known as: CHRONULAC Take 45 mLs (30 g total) by mouth 3 (three) times daily.   multivitamin with minerals Tabs tablet Take 1 tablet by mouth daily.   pravastatin 20 MG tablet Commonly known as: PRAVACHOL Take 3 tablets (60 mg total) by mouth daily.   propranolol 10 MG tablet Commonly known as: INDERAL Take 1 tablet (10 mg total) by mouth 2 (two) times daily.   rifaximin 550 MG Tabs tablet Commonly known as: XIFAXAN Take 1 tablet (550 mg total) by mouth 2 (two) times daily.   spironolactone 50 MG tablet Commonly known as: ALDACTONE Take 100 mg by mouth daily.          Electronically Signed: Theresa Duty, NP 03/20/2022, 9:08 AM   I have spent Less Than 30 Minutes discharging Peter Nichols.

## 2022-03-21 ENCOUNTER — Other Ambulatory Visit (HOSPITAL_COMMUNITY): Payer: Self-pay | Admitting: Gastroenterology

## 2022-03-21 ENCOUNTER — Other Ambulatory Visit: Payer: Self-pay | Admitting: Gastroenterology

## 2022-03-21 DIAGNOSIS — K7031 Alcoholic cirrhosis of liver with ascites: Secondary | ICD-10-CM

## 2022-03-30 ENCOUNTER — Ambulatory Visit (HOSPITAL_COMMUNITY)
Admission: RE | Admit: 2022-03-30 | Discharge: 2022-03-30 | Disposition: A | Discharge status: Home / Self Care | Payer: Medicare HMO | Source: Ambulatory Visit | Attending: Physician Assistant | Admitting: Physician Assistant

## 2022-03-30 ENCOUNTER — Ambulatory Visit (HOSPITAL_COMMUNITY)
Admission: RE | Admit: 2022-03-30 | Discharge: 2022-03-30 | Disposition: A | Discharge status: Home / Self Care | Payer: Medicare HMO | Source: Ambulatory Visit | Attending: Gastroenterology | Admitting: Gastroenterology

## 2022-03-30 DIAGNOSIS — K7030 Alcoholic cirrhosis of liver without ascites: Secondary | ICD-10-CM | POA: Diagnosis not present

## 2022-03-30 DIAGNOSIS — J90 Pleural effusion, not elsewhere classified: Secondary | ICD-10-CM | POA: Diagnosis not present

## 2022-03-30 DIAGNOSIS — K7031 Alcoholic cirrhosis of liver with ascites: Secondary | ICD-10-CM

## 2022-03-30 DIAGNOSIS — R918 Other nonspecific abnormal finding of lung field: Secondary | ICD-10-CM | POA: Diagnosis not present

## 2022-03-30 HISTORY — PX: IR THORACENTESIS ASP PLEURAL SPACE W/IMG GUIDE: IMG5380

## 2022-03-30 MED ORDER — LIDOCAINE HCL 1 % IJ SOLN
INTRAMUSCULAR | Status: AC
  Filled 2022-03-30: qty 20

## 2022-03-30 MED ORDER — LIDOCAINE HCL (PF) 1 % IJ SOLN
INTRAMUSCULAR | Status: DC | PRN
  Administered 2022-03-30: 5 mL

## 2022-03-30 NOTE — Procedures (Signed)
PROCEDURE SUMMARY:  Successful US guided right thoracentesis. Yielded 2L of yellow pleural fluid. Pt tolerated procedure well. No immediate complications.  Specimen not sent for labs. CXR ordered.  EBL < 5 mL  Berenis Corter PA-C 03/30/2022 1:42 PM

## 2022-04-02 ENCOUNTER — Other Ambulatory Visit (HOSPITAL_COMMUNITY): Payer: Self-pay | Admitting: Gastroenterology

## 2022-04-02 ENCOUNTER — Ambulatory Visit (HOSPITAL_COMMUNITY)
Admission: RE | Admit: 2022-04-02 | Discharge: 2022-04-02 | Disposition: A | Discharge status: Home / Self Care | Payer: Medicare HMO | Source: Ambulatory Visit | Attending: Gastroenterology | Admitting: Gastroenterology

## 2022-04-02 DIAGNOSIS — K7031 Alcoholic cirrhosis of liver with ascites: Secondary | ICD-10-CM | POA: Diagnosis not present

## 2022-04-02 DIAGNOSIS — K7460 Unspecified cirrhosis of liver: Secondary | ICD-10-CM | POA: Diagnosis not present

## 2022-04-02 DIAGNOSIS — R188 Other ascites: Secondary | ICD-10-CM | POA: Diagnosis not present

## 2022-04-02 HISTORY — PX: IR PARACENTESIS: IMG2679

## 2022-04-02 MED ORDER — ALBUMIN HUMAN 25 % IV SOLN
50.0000 g | Freq: Once | INTRAVENOUS | Status: AC
  Administered 2022-04-02: 50 g via INTRAVENOUS

## 2022-04-02 MED ORDER — LIDOCAINE HCL 1 % IJ SOLN
INTRAMUSCULAR | Status: AC
  Administered 2022-04-02: 10 mL
  Filled 2022-04-02: qty 20

## 2022-04-02 MED ORDER — ALBUMIN HUMAN 25 % IV SOLN
INTRAVENOUS | Status: AC
  Filled 2022-04-02: qty 200

## 2022-04-02 NOTE — Procedures (Signed)
PROCEDURE SUMMARY:  Successful ultrasound guided paracentesis from the right lower abdomen.   Yielded 4.2 L of hazy yellow fluid.  No immediate complications.  The patient tolerated the procedure well.   Specimen was not  sent for labs.  EBL < 1 mL  The patient has previously been formally evaluated by the [facility] and is being actively followed for potential future intervention.  *Patient was scheduled for elective TIPS on 03/17/22, the procedure was aborted due to elevated right heart pressure. Right heart pressure remained elevated even after right thoracentesis and paracentesis were performed, procedure was aborted.  Armando Gang Zaidin Blyden PA-C 04/02/2022 2:46 PM

## 2022-04-06 ENCOUNTER — Ambulatory Visit (HOSPITAL_COMMUNITY)
Admission: RE | Admit: 2022-04-06 | Discharge: 2022-04-06 | Disposition: A | Discharge status: Home / Self Care | Payer: Medicare HMO | Source: Ambulatory Visit | Attending: Internal Medicine | Admitting: Internal Medicine

## 2022-04-06 ENCOUNTER — Ambulatory Visit (HOSPITAL_COMMUNITY)
Admission: RE | Admit: 2022-04-06 | Discharge: 2022-04-06 | Disposition: A | Discharge status: Home / Self Care | Payer: Medicare HMO | Source: Ambulatory Visit | Attending: Gastroenterology | Admitting: Gastroenterology

## 2022-04-06 VITALS — BP 113/54

## 2022-04-06 DIAGNOSIS — J90 Pleural effusion, not elsewhere classified: Secondary | ICD-10-CM | POA: Insufficient documentation

## 2022-04-06 DIAGNOSIS — R918 Other nonspecific abnormal finding of lung field: Secondary | ICD-10-CM | POA: Diagnosis not present

## 2022-04-06 DIAGNOSIS — K7031 Alcoholic cirrhosis of liver with ascites: Secondary | ICD-10-CM | POA: Insufficient documentation

## 2022-04-06 DIAGNOSIS — R739 Hyperglycemia, unspecified: Secondary | ICD-10-CM

## 2022-04-06 HISTORY — PX: IR THORACENTESIS ASP PLEURAL SPACE W/IMG GUIDE: IMG5380

## 2022-04-06 MED ORDER — LIDOCAINE HCL 1 % IJ SOLN
INTRAMUSCULAR | Status: AC
  Filled 2022-04-06: qty 20

## 2022-04-06 NOTE — Procedures (Signed)
PROCEDURE SUMMARY:  Successful US guided therapeutic right thoracentesis. Yielded 1.5 L of clear, golden fluid. Pt tolerated procedure well. No immediate complications.  Specimen not sent for labs. CXR ordered.  EBL < 1 mL  Tyson Alias, AGNP 04/06/2022 2:41 PM

## 2022-04-08 DIAGNOSIS — M6281 Muscle weakness (generalized): Secondary | ICD-10-CM | POA: Diagnosis not present

## 2022-04-08 DIAGNOSIS — R2681 Unsteadiness on feet: Secondary | ICD-10-CM | POA: Diagnosis not present

## 2022-04-09 DIAGNOSIS — K7031 Alcoholic cirrhosis of liver with ascites: Secondary | ICD-10-CM | POA: Diagnosis not present

## 2022-04-09 DIAGNOSIS — K7682 Hepatic encephalopathy: Secondary | ICD-10-CM | POA: Diagnosis not present

## 2022-04-09 DIAGNOSIS — Z1159 Encounter for screening for other viral diseases: Secondary | ICD-10-CM | POA: Diagnosis not present

## 2022-04-09 DIAGNOSIS — J918 Pleural effusion in other conditions classified elsewhere: Secondary | ICD-10-CM | POA: Diagnosis not present

## 2022-04-09 DIAGNOSIS — M6284 Sarcopenia: Secondary | ICD-10-CM | POA: Diagnosis not present

## 2022-04-09 DIAGNOSIS — R600 Localized edema: Secondary | ICD-10-CM | POA: Diagnosis not present

## 2022-04-09 DIAGNOSIS — K766 Portal hypertension: Secondary | ICD-10-CM | POA: Diagnosis not present

## 2022-04-09 DIAGNOSIS — K769 Liver disease, unspecified: Secondary | ICD-10-CM | POA: Diagnosis not present

## 2022-04-10 ENCOUNTER — Ambulatory Visit (HOSPITAL_COMMUNITY)
Admission: RE | Admit: 2022-04-10 | Discharge: 2022-04-10 | Disposition: A | Discharge status: Home / Self Care | Payer: Medicare HMO | Source: Ambulatory Visit | Attending: Gastroenterology | Admitting: Gastroenterology

## 2022-04-10 ENCOUNTER — Other Ambulatory Visit (HOSPITAL_COMMUNITY): Payer: Self-pay | Admitting: Gastroenterology

## 2022-04-10 DIAGNOSIS — R188 Other ascites: Secondary | ICD-10-CM

## 2022-04-10 DIAGNOSIS — J90 Pleural effusion, not elsewhere classified: Secondary | ICD-10-CM

## 2022-04-10 DIAGNOSIS — K7031 Alcoholic cirrhosis of liver with ascites: Secondary | ICD-10-CM | POA: Insufficient documentation

## 2022-04-10 DIAGNOSIS — K7460 Unspecified cirrhosis of liver: Secondary | ICD-10-CM | POA: Diagnosis not present

## 2022-04-10 HISTORY — PX: IR PARACENTESIS: IMG2679

## 2022-04-10 MED ORDER — ALBUMIN HUMAN 25 % IV SOLN
50.0000 g | Freq: Once | INTRAVENOUS | Status: AC
  Administered 2022-04-10: 50 g via INTRAVENOUS
  Filled 2022-04-10: qty 200

## 2022-04-10 MED ORDER — LIDOCAINE HCL (PF) 1 % IJ SOLN
INTRAMUSCULAR | Status: DC | PRN
  Administered 2022-04-10: 10 mL

## 2022-04-10 MED ORDER — ALBUMIN HUMAN 25 % IV SOLN
INTRAVENOUS | Status: AC
  Filled 2022-04-10: qty 200

## 2022-04-10 MED ORDER — LIDOCAINE HCL 1 % IJ SOLN
INTRAMUSCULAR | Status: AC
  Filled 2022-04-10: qty 20

## 2022-04-14 DIAGNOSIS — K7031 Alcoholic cirrhosis of liver with ascites: Secondary | ICD-10-CM | POA: Diagnosis not present

## 2022-04-15 ENCOUNTER — Ambulatory Visit (HOSPITAL_COMMUNITY)
Admission: RE | Admit: 2022-04-15 | Discharge: 2022-04-15 | Disposition: A | Discharge status: Home / Self Care | Payer: Medicare HMO | Source: Ambulatory Visit | Attending: Gastroenterology | Admitting: Gastroenterology

## 2022-04-15 ENCOUNTER — Ambulatory Visit (HOSPITAL_COMMUNITY)
Admission: RE | Admit: 2022-04-15 | Discharge: 2022-04-15 | Disposition: A | Discharge status: Home / Self Care | Payer: Medicare HMO | Source: Ambulatory Visit | Attending: Physician Assistant | Admitting: Physician Assistant

## 2022-04-15 DIAGNOSIS — K7031 Alcoholic cirrhosis of liver with ascites: Secondary | ICD-10-CM | POA: Insufficient documentation

## 2022-04-15 DIAGNOSIS — J90 Pleural effusion, not elsewhere classified: Secondary | ICD-10-CM | POA: Insufficient documentation

## 2022-04-15 HISTORY — PX: IR THORACENTESIS ASP PLEURAL SPACE W/IMG GUIDE: IMG5380

## 2022-04-15 MED ORDER — LIDOCAINE HCL 1 % IJ SOLN
INTRAMUSCULAR | Status: AC
  Administered 2022-04-15: 10 mL
  Filled 2022-04-15: qty 20

## 2022-04-15 NOTE — Procedures (Signed)
PROCEDURE SUMMARY:  Successful US guided right thoracentesis. Yielded 800cc of amber pleural fluid. Pt tolerated procedure well. No immediate complications.  Specimen not sent for labs. CXR ordered.  EBL < 5 mL  Ennis Heavner PA-C 04/15/2022 2:30 PM

## 2022-04-16 ENCOUNTER — Inpatient Hospital Stay: Admission: RE | Admit: 2022-04-16 | Payer: Medicare HMO | Source: Ambulatory Visit

## 2022-04-16 ENCOUNTER — Other Ambulatory Visit: Payer: Medicare HMO

## 2022-04-17 ENCOUNTER — Ambulatory Visit (HOSPITAL_COMMUNITY)
Admission: RE | Admit: 2022-04-17 | Discharge: 2022-04-17 | Disposition: A | Discharge status: Home / Self Care | Payer: Medicare HMO | Source: Ambulatory Visit | Attending: Gastroenterology | Admitting: Gastroenterology

## 2022-04-17 ENCOUNTER — Other Ambulatory Visit (HOSPITAL_COMMUNITY): Payer: Self-pay | Admitting: Gastroenterology

## 2022-04-17 DIAGNOSIS — Z9049 Acquired absence of other specified parts of digestive tract: Secondary | ICD-10-CM | POA: Diagnosis not present

## 2022-04-17 DIAGNOSIS — G9340 Encephalopathy, unspecified: Secondary | ICD-10-CM | POA: Diagnosis not present

## 2022-04-17 DIAGNOSIS — R5383 Other fatigue: Secondary | ICD-10-CM | POA: Diagnosis not present

## 2022-04-17 DIAGNOSIS — N179 Acute kidney failure, unspecified: Secondary | ICD-10-CM | POA: Diagnosis present

## 2022-04-17 DIAGNOSIS — I959 Hypotension, unspecified: Secondary | ICD-10-CM | POA: Diagnosis not present

## 2022-04-17 DIAGNOSIS — K3189 Other diseases of stomach and duodenum: Secondary | ICD-10-CM | POA: Diagnosis not present

## 2022-04-17 DIAGNOSIS — R0600 Dyspnea, unspecified: Secondary | ICD-10-CM | POA: Diagnosis not present

## 2022-04-17 DIAGNOSIS — Z66 Do not resuscitate: Secondary | ICD-10-CM | POA: Diagnosis not present

## 2022-04-17 DIAGNOSIS — D61818 Other pancytopenia: Secondary | ICD-10-CM | POA: Diagnosis present

## 2022-04-17 DIAGNOSIS — Z0389 Encounter for observation for other suspected diseases and conditions ruled out: Secondary | ICD-10-CM | POA: Diagnosis not present

## 2022-04-17 DIAGNOSIS — E872 Acidosis, unspecified: Secondary | ICD-10-CM | POA: Diagnosis present

## 2022-04-17 DIAGNOSIS — K219 Gastro-esophageal reflux disease without esophagitis: Secondary | ICD-10-CM | POA: Diagnosis not present

## 2022-04-17 DIAGNOSIS — E871 Hypo-osmolality and hyponatremia: Secondary | ICD-10-CM | POA: Diagnosis not present

## 2022-04-17 DIAGNOSIS — R52 Pain, unspecified: Secondary | ICD-10-CM | POA: Diagnosis not present

## 2022-04-17 DIAGNOSIS — K6389 Other specified diseases of intestine: Secondary | ICD-10-CM | POA: Diagnosis not present

## 2022-04-17 DIAGNOSIS — R140 Abdominal distension (gaseous): Secondary | ICD-10-CM | POA: Diagnosis not present

## 2022-04-17 DIAGNOSIS — D696 Thrombocytopenia, unspecified: Secondary | ICD-10-CM | POA: Diagnosis not present

## 2022-04-17 DIAGNOSIS — I272 Pulmonary hypertension, unspecified: Secondary | ICD-10-CM | POA: Diagnosis present

## 2022-04-17 DIAGNOSIS — R0602 Shortness of breath: Secondary | ICD-10-CM | POA: Diagnosis not present

## 2022-04-17 DIAGNOSIS — G9341 Metabolic encephalopathy: Secondary | ICD-10-CM | POA: Diagnosis present

## 2022-04-17 DIAGNOSIS — K7460 Unspecified cirrhosis of liver: Secondary | ICD-10-CM | POA: Diagnosis not present

## 2022-04-17 DIAGNOSIS — Z4659 Encounter for fitting and adjustment of other gastrointestinal appliance and device: Secondary | ICD-10-CM | POA: Diagnosis not present

## 2022-04-17 DIAGNOSIS — I82812 Embolism and thrombosis of superficial veins of left lower extremities: Secondary | ICD-10-CM | POA: Diagnosis present

## 2022-04-17 DIAGNOSIS — E875 Hyperkalemia: Secondary | ICD-10-CM | POA: Diagnosis not present

## 2022-04-17 DIAGNOSIS — L89152 Pressure ulcer of sacral region, stage 2: Secondary | ICD-10-CM | POA: Diagnosis present

## 2022-04-17 DIAGNOSIS — E785 Hyperlipidemia, unspecified: Secondary | ICD-10-CM | POA: Diagnosis not present

## 2022-04-17 DIAGNOSIS — K7682 Hepatic encephalopathy: Secondary | ICD-10-CM | POA: Diagnosis present

## 2022-04-17 DIAGNOSIS — Z7189 Other specified counseling: Secondary | ICD-10-CM | POA: Diagnosis not present

## 2022-04-17 DIAGNOSIS — E43 Unspecified severe protein-calorie malnutrition: Secondary | ICD-10-CM | POA: Diagnosis present

## 2022-04-17 DIAGNOSIS — J948 Other specified pleural conditions: Secondary | ICD-10-CM | POA: Diagnosis not present

## 2022-04-17 DIAGNOSIS — D638 Anemia in other chronic diseases classified elsewhere: Secondary | ICD-10-CM | POA: Diagnosis present

## 2022-04-17 DIAGNOSIS — D6959 Other secondary thrombocytopenia: Secondary | ICD-10-CM | POA: Diagnosis present

## 2022-04-17 DIAGNOSIS — R059 Cough, unspecified: Secondary | ICD-10-CM | POA: Diagnosis not present

## 2022-04-17 DIAGNOSIS — I8510 Secondary esophageal varices without bleeding: Secondary | ICD-10-CM | POA: Diagnosis present

## 2022-04-17 DIAGNOSIS — K5730 Diverticulosis of large intestine without perforation or abscess without bleeding: Secondary | ICD-10-CM | POA: Diagnosis not present

## 2022-04-17 DIAGNOSIS — J90 Pleural effusion, not elsewhere classified: Secondary | ICD-10-CM | POA: Diagnosis not present

## 2022-04-17 DIAGNOSIS — K766 Portal hypertension: Secondary | ICD-10-CM | POA: Diagnosis not present

## 2022-04-17 DIAGNOSIS — Z515 Encounter for palliative care: Secondary | ICD-10-CM | POA: Diagnosis not present

## 2022-04-17 DIAGNOSIS — R64 Cachexia: Secondary | ICD-10-CM | POA: Diagnosis present

## 2022-04-17 DIAGNOSIS — R188 Other ascites: Secondary | ICD-10-CM

## 2022-04-17 DIAGNOSIS — R531 Weakness: Secondary | ICD-10-CM | POA: Diagnosis not present

## 2022-04-17 DIAGNOSIS — R609 Edema, unspecified: Secondary | ICD-10-CM | POA: Diagnosis not present

## 2022-04-17 DIAGNOSIS — Z9689 Presence of other specified functional implants: Secondary | ICD-10-CM | POA: Diagnosis not present

## 2022-04-17 DIAGNOSIS — D539 Nutritional anemia, unspecified: Secondary | ICD-10-CM | POA: Diagnosis not present

## 2022-04-17 DIAGNOSIS — R5381 Other malaise: Secondary | ICD-10-CM | POA: Diagnosis not present

## 2022-04-17 DIAGNOSIS — F1021 Alcohol dependence, in remission: Secondary | ICD-10-CM | POA: Diagnosis present

## 2022-04-17 DIAGNOSIS — I10 Essential (primary) hypertension: Secondary | ICD-10-CM | POA: Diagnosis present

## 2022-04-17 DIAGNOSIS — I8501 Esophageal varices with bleeding: Secondary | ICD-10-CM | POA: Diagnosis not present

## 2022-04-17 DIAGNOSIS — R051 Acute cough: Secondary | ICD-10-CM | POA: Diagnosis not present

## 2022-04-17 DIAGNOSIS — D649 Anemia, unspecified: Secondary | ICD-10-CM | POA: Diagnosis not present

## 2022-04-17 DIAGNOSIS — D689 Coagulation defect, unspecified: Secondary | ICD-10-CM | POA: Diagnosis not present

## 2022-04-17 DIAGNOSIS — K7031 Alcoholic cirrhosis of liver with ascites: Secondary | ICD-10-CM | POA: Diagnosis present

## 2022-04-17 DIAGNOSIS — Z8679 Personal history of other diseases of the circulatory system: Secondary | ICD-10-CM | POA: Diagnosis not present

## 2022-04-17 DIAGNOSIS — K7290 Hepatic failure, unspecified without coma: Secondary | ICD-10-CM | POA: Diagnosis not present

## 2022-04-17 DIAGNOSIS — K7040 Alcoholic hepatic failure without coma: Secondary | ICD-10-CM | POA: Diagnosis present

## 2022-04-17 DIAGNOSIS — K567 Ileus, unspecified: Secondary | ICD-10-CM | POA: Diagnosis not present

## 2022-04-17 DIAGNOSIS — I82819 Embolism and thrombosis of superficial veins of unspecified lower extremities: Secondary | ICD-10-CM | POA: Diagnosis not present

## 2022-04-17 HISTORY — PX: IR PARACENTESIS: IMG2679

## 2022-04-17 MED ORDER — ALBUMIN HUMAN 25 % IV SOLN
50.0000 g | Freq: Once | INTRAVENOUS | Status: AC
  Administered 2022-04-17: 50 g via INTRAVENOUS

## 2022-04-17 MED ORDER — ALBUMIN HUMAN 25 % IV SOLN
INTRAVENOUS | Status: AC
  Filled 2022-04-17: qty 200

## 2022-04-17 MED ORDER — LIDOCAINE HCL 1 % IJ SOLN
INTRAMUSCULAR | Status: AC
  Administered 2022-04-17: 10 mL
  Filled 2022-04-17: qty 20

## 2022-04-18 ENCOUNTER — Emergency Department (HOSPITAL_BASED_OUTPATIENT_CLINIC_OR_DEPARTMENT_OTHER): Admit: 2022-04-18 | Discharge: 2022-04-18 | Disposition: A | Discharge status: Home / Self Care | Payer: Medicare HMO

## 2022-04-18 ENCOUNTER — Inpatient Hospital Stay (HOSPITAL_COMMUNITY)
Admission: EM | Admit: 2022-04-18 | Discharge: 2022-05-19 | DRG: 405 | Disposition: E | Payer: Medicare HMO | Attending: Internal Medicine | Admitting: Internal Medicine

## 2022-04-18 ENCOUNTER — Encounter (HOSPITAL_COMMUNITY): Payer: Self-pay

## 2022-04-18 ENCOUNTER — Emergency Department (HOSPITAL_COMMUNITY): Payer: Medicare HMO

## 2022-04-18 ENCOUNTER — Other Ambulatory Visit: Payer: Self-pay

## 2022-04-18 DIAGNOSIS — E860 Dehydration: Secondary | ICD-10-CM | POA: Diagnosis present

## 2022-04-18 DIAGNOSIS — G9341 Metabolic encephalopathy: Secondary | ICD-10-CM | POA: Diagnosis present

## 2022-04-18 DIAGNOSIS — N401 Enlarged prostate with lower urinary tract symptoms: Secondary | ICD-10-CM | POA: Diagnosis present

## 2022-04-18 DIAGNOSIS — N179 Acute kidney failure, unspecified: Secondary | ICD-10-CM | POA: Diagnosis present

## 2022-04-18 DIAGNOSIS — E872 Acidosis, unspecified: Secondary | ICD-10-CM | POA: Diagnosis present

## 2022-04-18 DIAGNOSIS — I959 Hypotension, unspecified: Secondary | ICD-10-CM | POA: Diagnosis not present

## 2022-04-18 DIAGNOSIS — K7460 Unspecified cirrhosis of liver: Secondary | ICD-10-CM

## 2022-04-18 DIAGNOSIS — D696 Thrombocytopenia, unspecified: Secondary | ICD-10-CM | POA: Diagnosis present

## 2022-04-18 DIAGNOSIS — E871 Hypo-osmolality and hyponatremia: Secondary | ICD-10-CM | POA: Diagnosis not present

## 2022-04-18 DIAGNOSIS — R52 Pain, unspecified: Secondary | ICD-10-CM

## 2022-04-18 DIAGNOSIS — K7290 Hepatic failure, unspecified without coma: Secondary | ICD-10-CM

## 2022-04-18 DIAGNOSIS — R64 Cachexia: Secondary | ICD-10-CM | POA: Diagnosis present

## 2022-04-18 DIAGNOSIS — I82890 Acute embolism and thrombosis of other specified veins: Secondary | ICD-10-CM

## 2022-04-18 DIAGNOSIS — R531 Weakness: Principal | ICD-10-CM

## 2022-04-18 DIAGNOSIS — K219 Gastro-esophageal reflux disease without esophagitis: Secondary | ICD-10-CM

## 2022-04-18 DIAGNOSIS — Z66 Do not resuscitate: Secondary | ICD-10-CM

## 2022-04-18 DIAGNOSIS — K7581 Nonalcoholic steatohepatitis (NASH): Secondary | ICD-10-CM | POA: Diagnosis present

## 2022-04-18 DIAGNOSIS — R188 Other ascites: Secondary | ICD-10-CM

## 2022-04-18 DIAGNOSIS — Z792 Long term (current) use of antibiotics: Secondary | ICD-10-CM

## 2022-04-18 DIAGNOSIS — F1021 Alcohol dependence, in remission: Secondary | ICD-10-CM | POA: Diagnosis present

## 2022-04-18 DIAGNOSIS — R251 Tremor, unspecified: Secondary | ICD-10-CM | POA: Diagnosis present

## 2022-04-18 DIAGNOSIS — R338 Other retention of urine: Secondary | ICD-10-CM | POA: Diagnosis not present

## 2022-04-18 DIAGNOSIS — R278 Other lack of coordination: Secondary | ICD-10-CM | POA: Diagnosis present

## 2022-04-18 DIAGNOSIS — R609 Edema, unspecified: Secondary | ICD-10-CM | POA: Diagnosis not present

## 2022-04-18 DIAGNOSIS — E785 Hyperlipidemia, unspecified: Secondary | ICD-10-CM | POA: Diagnosis present

## 2022-04-18 DIAGNOSIS — D61818 Other pancytopenia: Secondary | ICD-10-CM | POA: Diagnosis present

## 2022-04-18 DIAGNOSIS — Z7189 Other specified counseling: Secondary | ICD-10-CM

## 2022-04-18 DIAGNOSIS — I82819 Embolism and thrombosis of superficial veins of unspecified lower extremities: Secondary | ICD-10-CM | POA: Diagnosis not present

## 2022-04-18 DIAGNOSIS — R54 Age-related physical debility: Secondary | ICD-10-CM | POA: Diagnosis present

## 2022-04-18 DIAGNOSIS — I82812 Embolism and thrombosis of superficial veins of left lower extremities: Secondary | ICD-10-CM | POA: Diagnosis present

## 2022-04-18 DIAGNOSIS — E876 Hypokalemia: Secondary | ICD-10-CM | POA: Diagnosis not present

## 2022-04-18 DIAGNOSIS — K7031 Alcoholic cirrhosis of liver with ascites: Secondary | ICD-10-CM | POA: Diagnosis not present

## 2022-04-18 DIAGNOSIS — K7682 Hepatic encephalopathy: Secondary | ICD-10-CM | POA: Diagnosis present

## 2022-04-18 DIAGNOSIS — K567 Ileus, unspecified: Secondary | ICD-10-CM | POA: Diagnosis not present

## 2022-04-18 DIAGNOSIS — D731 Hypersplenism: Secondary | ICD-10-CM | POA: Diagnosis present

## 2022-04-18 DIAGNOSIS — Z79899 Other long term (current) drug therapy: Secondary | ICD-10-CM

## 2022-04-18 DIAGNOSIS — K7040 Alcoholic hepatic failure without coma: Principal | ICD-10-CM | POA: Diagnosis present

## 2022-04-18 DIAGNOSIS — L89152 Pressure ulcer of sacral region, stage 2: Secondary | ICD-10-CM | POA: Diagnosis not present

## 2022-04-18 DIAGNOSIS — Z809 Family history of malignant neoplasm, unspecified: Secondary | ICD-10-CM

## 2022-04-18 DIAGNOSIS — D649 Anemia, unspecified: Secondary | ICD-10-CM | POA: Diagnosis present

## 2022-04-18 DIAGNOSIS — Z515 Encounter for palliative care: Secondary | ICD-10-CM

## 2022-04-18 DIAGNOSIS — I10 Essential (primary) hypertension: Secondary | ICD-10-CM | POA: Diagnosis present

## 2022-04-18 DIAGNOSIS — J948 Other specified pleural conditions: Secondary | ICD-10-CM | POA: Diagnosis not present

## 2022-04-18 DIAGNOSIS — E875 Hyperkalemia: Secondary | ICD-10-CM | POA: Diagnosis not present

## 2022-04-18 DIAGNOSIS — E43 Unspecified severe protein-calorie malnutrition: Secondary | ICD-10-CM | POA: Insufficient documentation

## 2022-04-18 DIAGNOSIS — R140 Abdominal distension (gaseous): Secondary | ICD-10-CM | POA: Diagnosis not present

## 2022-04-18 DIAGNOSIS — D638 Anemia in other chronic diseases classified elsewhere: Secondary | ICD-10-CM | POA: Diagnosis present

## 2022-04-18 DIAGNOSIS — R5383 Other fatigue: Secondary | ICD-10-CM | POA: Diagnosis not present

## 2022-04-18 DIAGNOSIS — D6959 Other secondary thrombocytopenia: Secondary | ICD-10-CM | POA: Diagnosis present

## 2022-04-18 DIAGNOSIS — D689 Coagulation defect, unspecified: Secondary | ICD-10-CM | POA: Diagnosis not present

## 2022-04-18 DIAGNOSIS — I272 Pulmonary hypertension, unspecified: Secondary | ICD-10-CM | POA: Diagnosis present

## 2022-04-18 DIAGNOSIS — I8510 Secondary esophageal varices without bleeding: Secondary | ICD-10-CM | POA: Diagnosis present

## 2022-04-18 DIAGNOSIS — Z6824 Body mass index (BMI) 24.0-24.9, adult: Secondary | ICD-10-CM

## 2022-04-18 LAB — COMPREHENSIVE METABOLIC PANEL
ALT: 42 U/L (ref 0–44)
AST: 49 U/L — ABNORMAL HIGH (ref 15–41)
Albumin: 3.4 g/dL — ABNORMAL LOW (ref 3.5–5.0)
Alkaline Phosphatase: 56 U/L (ref 38–126)
Anion gap: 15 (ref 5–15)
BUN: 23 mg/dL (ref 8–23)
CO2: 20 mmol/L — ABNORMAL LOW (ref 22–32)
Calcium: 9.8 mg/dL (ref 8.9–10.3)
Chloride: 98 mmol/L (ref 98–111)
Creatinine, Ser: 1.32 mg/dL — ABNORMAL HIGH (ref 0.61–1.24)
GFR, Estimated: 56 mL/min — ABNORMAL LOW (ref >60)
Glucose, Bld: 115 mg/dL — ABNORMAL HIGH (ref 70–99)
Potassium: 3.7 mmol/L (ref 3.5–5.1)
Sodium: 133 mmol/L — ABNORMAL LOW (ref 135–145)
Total Bilirubin: 10.4 mg/dL — ABNORMAL HIGH (ref 0.3–1.2)
Total Protein: 5.5 g/dL — ABNORMAL LOW (ref 6.5–8.1)

## 2022-04-18 LAB — LACTIC ACID, PLASMA
Lactic Acid, Venous: 3.1 mmol/L (ref 0.5–1.9)
Lactic Acid, Venous: 4.3 mmol/L (ref 0.5–1.9)
Lactic Acid, Venous: 4.3 mmol/L (ref 0.5–1.9)
Lactic Acid, Venous: 4 mmol/L (ref 0.5–1.9)

## 2022-04-18 LAB — GLUCOSE, CAPILLARY: Glucose-Capillary: 140 mg/dL — ABNORMAL HIGH (ref 70–99)

## 2022-04-18 LAB — CBC
HCT: 32.4 % — ABNORMAL LOW (ref 39.0–52.0)
Hemoglobin: 11.1 g/dL — ABNORMAL LOW (ref 13.0–17.0)
MCH: 35.4 pg — ABNORMAL HIGH (ref 26.0–34.0)
MCHC: 34.3 g/dL (ref 30.0–36.0)
MCV: 103.2 fL — ABNORMAL HIGH (ref 80.0–100.0)
Platelets: 85 10*3/uL — ABNORMAL LOW (ref 150–400)
RBC: 3.14 MIL/uL — ABNORMAL LOW (ref 4.22–5.81)
RDW: 15.3 % (ref 11.5–15.5)
WBC: 7 10*3/uL (ref 4.0–10.5)
nRBC: 0 % (ref 0.0–0.2)

## 2022-04-18 LAB — AMMONIA: Ammonia: 119 umol/L — ABNORMAL HIGH (ref 9–35)

## 2022-04-18 LAB — MAGNESIUM: Magnesium: 2 mg/dL (ref 1.7–2.4)

## 2022-04-18 LAB — PROTIME-INR
INR: 2.1 — ABNORMAL HIGH (ref 0.8–1.2)
Prothrombin Time: 23.7 seconds — ABNORMAL HIGH (ref 11.4–15.2)

## 2022-04-18 LAB — TROPONIN I (HIGH SENSITIVITY): Troponin I (High Sensitivity): 9 ng/L (ref <18)

## 2022-04-18 LAB — BRAIN NATRIURETIC PEPTIDE: B Natriuretic Peptide: 81.6 pg/mL (ref 0.0–100.0)

## 2022-04-18 LAB — POC OCCULT BLOOD, ED: Fecal Occult Bld: NEGATIVE

## 2022-04-18 LAB — CBG MONITORING, ED
Glucose-Capillary: 125 mg/dL — ABNORMAL HIGH (ref 70–99)
Glucose-Capillary: 193 mg/dL — ABNORMAL HIGH (ref 70–99)

## 2022-04-18 LAB — MRSA NEXT GEN BY PCR, NASAL: MRSA by PCR Next Gen: NOT DETECTED

## 2022-04-18 MED ORDER — SODIUM CHLORIDE 0.9 % IV BOLUS
1000.0000 mL | Freq: Once | INTRAVENOUS | Status: AC
Start: 2022-04-18 — End: 2022-04-18
  Administered 2022-04-18: 1000 mL via INTRAVENOUS

## 2022-04-18 MED ORDER — ONDANSETRON HCL 4 MG PO TABS: 4.0000 mg | ORAL_TABLET | Freq: Four times a day (QID) | ORAL | Status: DC | PRN

## 2022-04-18 MED ORDER — ONDANSETRON HCL 4 MG/2ML IJ SOLN
4.0000 mg | Freq: Four times a day (QID) | INTRAMUSCULAR | Status: DC | PRN
  Administered 2022-05-04: 4 mg via INTRAVENOUS
  Filled 2022-04-18: qty 2

## 2022-04-18 MED ORDER — RIFAXIMIN 550 MG PO TABS
550.0000 mg | ORAL_TABLET | Freq: Two times a day (BID) | ORAL | Status: DC
  Administered 2022-04-19 – 2022-04-26 (×14): 550 mg via ORAL
  Filled 2022-04-18 (×14): qty 1

## 2022-04-18 MED ORDER — LACTULOSE 10 GM/15ML PO SOLN
20.0000 g | Freq: Three times a day (TID) | ORAL | Status: DC
  Filled 2022-04-18: qty 30

## 2022-04-18 MED ORDER — ACETAMINOPHEN 650 MG RE SUPP
650.0000 mg | Freq: Four times a day (QID) | RECTAL | Status: DC | PRN
Start: 2022-04-18 — End: 2022-05-10

## 2022-04-18 MED ORDER — SODIUM CHLORIDE 0.9 % IV SOLN
2.0000 g | INTRAVENOUS | Status: DC
  Administered 2022-04-18 – 2022-04-24 (×7): 2 g via INTRAVENOUS
  Filled 2022-04-18 (×7): qty 20

## 2022-04-18 MED ORDER — ALBUTEROL SULFATE (2.5 MG/3ML) 0.083% IN NEBU: 2.5000 mg | INHALATION_SOLUTION | RESPIRATORY_TRACT | Status: DC | PRN

## 2022-04-18 MED ORDER — ALBUMIN HUMAN 25 % IV SOLN
12.5000 g | Freq: Once | INTRAVENOUS | Status: AC
  Administered 2022-04-19: 12.5 g via INTRAVENOUS
  Filled 2022-04-18: qty 50

## 2022-04-18 MED ORDER — ACETAMINOPHEN 325 MG PO TABS: 650.0000 mg | ORAL_TABLET | Freq: Four times a day (QID) | ORAL | Status: DC | PRN

## 2022-04-18 MED ORDER — LACTULOSE 10 GM/15ML PO SOLN
45.0000 g | Freq: Three times a day (TID) | ORAL | Status: DC
  Administered 2022-04-18 – 2022-04-19 (×2): 45 g via ORAL
  Filled 2022-04-18 (×3): qty 90

## 2022-04-18 MED ORDER — CHLORHEXIDINE GLUCONATE CLOTH 2 % EX PADS
6.0000 | MEDICATED_PAD | Freq: Every day | CUTANEOUS | Status: DC
  Administered 2022-04-18 – 2022-04-29 (×11): 6 via TOPICAL

## 2022-04-18 MED ORDER — LACTATED RINGERS IV SOLN
INTRAVENOUS | Status: DC
Start: 2022-04-18 — End: 2022-04-18

## 2022-04-18 MED ORDER — LACTULOSE 10 GM/15ML PO SOLN
20.0000 g | Freq: Once | ORAL | Status: AC
  Administered 2022-04-18: 20 g via ORAL
  Filled 2022-04-18: qty 30

## 2022-04-18 MED ORDER — PANTOPRAZOLE SODIUM 40 MG IV SOLR
40.0000 mg | Freq: Two times a day (BID) | INTRAVENOUS | Status: DC
  Administered 2022-04-18 – 2022-04-28 (×19): 40 mg via INTRAVENOUS
  Filled 2022-04-18 (×19): qty 10

## 2022-04-18 MED ORDER — ORAL CARE MOUTH RINSE: 15.0000 mL | OROMUCOSAL | Status: DC | PRN

## 2022-04-18 NOTE — H&P (Addendum)
History and Physical  [name] ZJI:[id number] DOB: Nov 10, 1945 DOA: 05/03/2022  PCP: [name], MD Patient coming from: Home   I have personally briefly reviewed patient's old medical records in [place]   Chief Complaint: Weakness  HPI: Peter Nichols is a 76 y.o. male alcoholic cirrhosis of the liver, recurrent ascites, portal hypertension, history of hepatic encephalopathy,  hepatic hydrothorax, large left retroperitoneal varix, chronic thrombocytopenia, he recently underwent ultrasound-guided thoracentesis  yielding 800 cc amber fluids on 6/28 prior to that he had paracentesis 6/23 yielding 4.5 L, his diuretics has been recently increased, he presents with generalized weakness since the morning of admission. He had another paracentesis 6/30 per family yielding 4 L.  His diuretics lasix and spironolactone were increase recently.  He has been so weak, he was not able to goes down stair.  Wife notice now some confuse. He only had one BM yesterday.  Patient denies abdominal pain.   Of note he was evaluated at [facility], not candidate for transplant due to age and co morbidities, but was advised to follow up at [facility]. His daughter is willing to be a donor. His Gastroenterologist has refer him to [facility]. He was not able to get TIPS done last months because of his Heart pressure were elevated.   Evaluation in the ED: Sodium 133, potassium 3.7, BUN 23, creatinine 1.3, albumin 3.4, AST 49, ALT 42, ammonia and 119, total bilirubin 10.4, BNP 81, lactic acid 3.1---4.3.  Hemoglobin 11, platelets 85. Chest x-ray: Negative   Review of Systems: All systems reviewed and apart from history of presenting illness, are negative.  Past Medical History:  Diagnosis Date   Cirrhosis ([place])    Dysphagia    no current problems   GERD (gastroesophageal reflux disease)    Hyperlipidemia    Hypertension    Neuromuscular disorder (HCC)    tremor   Steatohepatitis, non-alcoholic     Thrombocytopenia (HCC)    Past Surgical History:  Procedure Laterality Date   CHOLECYSTECTOMY     ESOPHAGOGASTRODUODENOSCOPY (EGD) WITH PROPOFOL Bilateral 08/06/2021   Procedure: ESOPHAGOGASTRODUODENOSCOPY (EGD) WITH PROPOFOL;  Surgeon: Arta Silence, MD;  Location: [facility];  Service: Endoscopy;  Laterality: Bilateral;   INGUINAL HERNIA REPAIR Right 12/23/2021   Procedure: RIGHT INGUINAL HERNIA REPAIR- OPEN;  Surgeon: Johnathan Hausen, MD;  Location: [facility];  Service: General;  Laterality: Right;   IR PARACENTESIS  06/16/2021   IR PARACENTESIS  07/11/2021   IR PARACENTESIS  01/29/2022   IR PARACENTESIS  02/12/2022   IR PARACENTESIS  03/17/2022   IR PARACENTESIS  04/02/2022   IR PARACENTESIS  04/10/2022   IR PARACENTESIS  04/17/2022   IR RADIOLOGIST EVAL & MGMT  03/04/2022   IR THORACENTESIS ASP PLEURAL SPACE W/IMG GUIDE  03/17/2022   IR THORACENTESIS ASP PLEURAL SPACE W/IMG GUIDE  03/30/2022   IR THORACENTESIS ASP PLEURAL SPACE W/IMG GUIDE  04/06/2022   IR THORACENTESIS ASP PLEURAL SPACE W/IMG GUIDE  04/15/2022   IR TIPS  03/17/2022   IR US GUIDE VASC ACCESS RIGHT  03/17/2022   RADIOLOGY WITH ANESTHESIA N/A 03/17/2022   Procedure: TIPS;  Surgeon: Mir, Paula Libra, MD;  Location: [facility];  Service: Radiology;  Laterality: N/A;   STOMACH SURGERY     Social History:  reports that he has never smoked. He has never used smokeless tobacco. He reports that he does not currently use alcohol. He reports that he does not use drugs.   Allergies  Allergen Reactions  Ensure Nausea And Vomiting    Family History  Problem Relation Age of Onset   Cancer Mother     Prior to Admission medications   Medication Sig Start Date End Date Taking? Authorizing Provider  b complex vitamins capsule Take 1 capsule by mouth daily.    [provider]  calcium carbonate (TUMS - DOSED IN MG ELEMENTAL CALCIUM) 500 MG chewable tablet Chew 1 tablet by mouth daily as needed for indigestion or heartburn.     [provider]  feeding supplement (ENSURE ENLIVE / ENSURE PLUS) LIQD Take 237 mLs by mouth daily. Patient not taking: Reported on 03/09/2022 01/02/22   Allie Bossier, MD  furosemide (LASIX) 40 MG tablet Take 40 mg by mouth daily. 01/09/22   [provider]  lactulose (CHRONULAC) 10 GM/15ML solution Take 45 mLs (30 g total) by mouth 3 (three) times daily. 01/02/22   Allie Bossier, MD  Multiple Vitamin (MULTIVITAMIN WITH MINERALS) TABS tablet Take 1 tablet by mouth daily. 05/25/21   Annita Brod, MD  Nutritional Supplements (,FEEDING SUPPLEMENT, PROSOURCE PLUS) liquid Take 30 mLs by mouth 2 (two) times daily between meals. 01/02/22   Allie Bossier, MD  pravastatin (PRAVACHOL) 20 MG tablet Take 3 tablets (60 mg total) by mouth daily. 01/03/22   Allie Bossier, MD  propranolol (INDERAL) 10 MG tablet Take 1 tablet (10 mg total) by mouth 2 (two) times daily. 06/03/21   Elgergawy, Silver Huguenin, MD  rifaximin (XIFAXAN) 550 MG TABS tablet Take 1 tablet (550 mg total) by mouth 2 (two) times daily. 01/02/22   Allie Bossier, MD  spironolactone (ALDACTONE) 50 MG tablet Take 100 mg by mouth daily. 01/09/22   [provider]   Physical Exam: Vitals:   05/18/2022 1550 05/09/2022 1600 05/09/2022 1615 05/04/2022 1700  BP: (!) 120/48  128/61 127/60  Pulse: 86 86 90 96  Resp: 17 15 17 19   Temp:      TempSrc:      SpO2: 96% 96% 99% 98%  Weight:      Height:        General exam: Thin appearing, icteric/.  Head, eyes and ENT: Nontraumatic and normocephalic. Pupils equally reacting to light and accommodation. Oral mucosa moist. Neck: Supple. No JVD, carotid bruit or thyromegaly. Lymphatics: No lymphadenopathy. Respiratory system: Clear to auscultation. No increased work of breathing. Cardiovascular system: S1 and S2 heard, RRR. No JVD, murmurs, gallops, clicks or pedal edema. Gastrointestinal system: Abdomen is distend, no tender, no rigidity  Central nervous system: Alert and oriented.  No focal neurological deficits. Extremities: Symmetric 5 x 5 power. Peripheral pulses symmetrically felt.  Skin: Icteric  Labs on Admission:  Basic Metabolic Panel: Recent Labs  Lab 05/02/2022 1422  NA 133*  K 3.7  CL 98  CO2 20*  GLUCOSE 115*  BUN 23  CREATININE 1.32*  CALCIUM 9.8   Liver Function Tests: Recent Labs  Lab 04/21/2022 1422  AST 49*  ALT 42  ALKPHOS 56  BILITOT 10.4*  PROT 5.5*  ALBUMIN 3.4*   No results for input(s): "LIPASE", "AMYLASE" in the last 168 hours. Recent Labs  Lab 04/21/2022 1419  AMMONIA 119*   CBC: Recent Labs  Lab 05/05/2022 1410  WBC 7.0  HGB 11.1*  HCT 32.4*  MCV 103.2*  PLT 85*   Cardiac Enzymes: No results for input(s): "CKTOTAL", "CKMB", "CKMBINDEX", "TROPONINI" in the last 168 hours.  BNP (last 3 results) No results for input(s): "PROBNP" in the last  8760 hours. CBG: Recent Labs  Lab 05/18/2022 1609  GLUCAP 125*    Radiological Exams on Admission: DG Chest 2 View  Result Date: 05/15/2022 CLINICAL DATA:  Generalized weakness since this morning, cirrhosis post paracentesis yesterday, diuretics increased yesterday, patient reports up all night urinating. History hypertension, NASH, GERD EXAM: CHEST - 2 VIEW COMPARISON:  04/15/2022 FINDINGS: Normal heart size, mediastinal contours, and pulmonary vascularity. Lungs clear. No pulmonary infiltrate, pleural effusion, or pneumothorax. Scattered endplate spur formation thoracic spine. IMPRESSION: No acute abnormalities. Electronically Signed   By: Lavonia Dana M.D.   On: 04/29/2022 15:13   VAS [place] LOWER EXTREMITY VENOUS (DVT) (7a-7p)  Result Date: 05/14/2022  Lower Venous DVT Study Patient Name:  Peter Nichols  Date of Exam:   04/22/2022 Medical Rec #: [id number]        Accession #:    [id number] Date of Birth: 1946/08/28         Patient Gender: M Patient Age:   86 years Exam Location:  [facility] Procedure:      VAS [place] LOWER EXTREMITY VENOUS (DVT) Referring Phys: Ova Freshwater FONDAW  --------------------------------------------------------------------------------  Indications: Pain.  Risk Factors: None identified. Limitations: Poor ultrasound/tissue interface. Comparison Study: No prior studies. Performing Technologist: Oliver Hum RVT  Examination Guidelines: A complete evaluation includes B-mode imaging, spectral Doppler, color Doppler, and power Doppler as needed of all accessible portions of each vessel. Bilateral testing is considered an integral part of a complete examination. Limited examinations for reoccurring indications may be performed as noted. The reflux portion of the exam is performed with the patient in reverse Trendelenburg.  +-----+---------------+---------+-----------+----------+--------------+ RIGHTCompressibilityPhasicitySpontaneityPropertiesThrombus Aging +-----+---------------+---------+-----------+----------+--------------+ CFV  Full           Yes      Yes                                 +-----+---------------+---------+-----------+----------+--------------+   +---------+---------------+---------+-----------+----------+-----------------+ LEFT     CompressibilityPhasicitySpontaneityPropertiesThrombus Aging    +---------+---------------+---------+-----------+----------+-----------------+ CFV      Full           Yes      Yes                                    +---------+---------------+---------+-----------+----------+-----------------+ SFJ      Full                                                           +---------+---------------+---------+-----------+----------+-----------------+ FV Prox  Full                                                           +---------+---------------+---------+-----------+----------+-----------------+ FV Mid   Full                                                           +---------+---------------+---------+-----------+----------+-----------------+  FV DistalFull                                                            +---------+---------------+---------+-----------+----------+-----------------+ PFV      Full                                                           +---------+---------------+---------+-----------+----------+-----------------+ POP      Full           Yes      Yes                                    +---------+---------------+---------+-----------+----------+-----------------+ PTV      Full                                                           +---------+---------------+---------+-----------+----------+-----------------+ PERO     Full                                                           +---------+---------------+---------+-----------+----------+-----------------+ SSV      None                                         Age Indeterminate +---------+---------------+---------+-----------+----------+-----------------+ The thrombus noted in the short saphenous vein extends from the proximal calf into the confluence with the popliteal vein. The thrombus does not appear to extend into the popliteal vein.   Summary: RIGHT: - No evidence of common femoral vein obstruction.  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 - There is no evidence of deep vein thrombosis in the lower extremity.  - No cystic structure found in the popliteal fossa. - The thrombus noted in the short saphenous vein extends from the proximal calf into the confluence with the popliteal vein. The thrombus does not appear to extend into the popliteal vein.  *See table(s) above for measurements and observations.    Preliminary    IR Paracentesis  Result Date: 04/17/2022 INDICATION: Patient with history of cirrhosis with recurrent ascites. TIPS procedure on 5.30.23 aborted due to elevated right heart pressures. EXAM: ULTRASOUND GUIDED THERAPEUTIC PARACENTESIS MEDICATIONS: Lidocaine 1% 10 mL  COMPLICATIONS: None immediate. PROCEDURE: Informed written consent was obtained from the patient after a discussion of the risks, benefits and alternatives to treatment. A timeout was performed prior to the initiation of the procedure. Initial ultrasound scanning demonstrates a moderate amount of ascites within the right lower abdominal quadrant. The right lower abdomen was prepped and draped in the usual sterile fashion. 1% lidocaine  was used for local anesthesia. Following this, a 19 gauge, 7-cm, Yueh catheter was introduced. An ultrasound image was saved for documentation purposes. The paracentesis was performed. The catheter was removed and a dressing was applied. The patient tolerated the procedure well without immediate post procedural complication. Patient received post-procedure intravenous albumin; see nursing notes for details. FINDINGS: A total of approximately 4.5 of straw-colored fluid was removed. IMPRESSION: Successful ultrasound-guided therapeutic paracentesis yielding 4.5 liters of peritoneal fluid. Read by: Rushie Nyhan, NP Electronically Signed   By: Miachel Roux M.D.   On: 04/17/2022 16:16    EKG: Independently reviewed. Sinus rhythm.   Assessment/Plan Principal Problem:   Lactic acidosis Active Problems:   Decompensated hepatic cirrhosis (HCC)   GERD (gastroesophageal reflux disease)  1-Generalized weakness;  Suspect related to hypovolemia, from recent paracentesis, thoracentesis, increase diuretics and decompensated cirrhosis -Plan to monitor for hypoglycemia.  -He will received 1 L IV fluid.  -Hold diuretics.    2-Lactic acidosis; could be related to hypovolemia, hypoperfusion. Also could be related to his liver cirrhosis.  Repeat lactic acid after IV fluids.  Will cover with IV ceftriaxone in case intra-abdominal infection.  Will have to be carefull with IV fluids and his history of cirrhosis.   3-Decompensated Cirrhosis of Liver;  Resume lactulose.  Hold  diuretics due to concern for dehydration.  GI will be consulted by ED physician.  Meld score.unable to calculate at this time, INR pending. Meld score 28. 19 % estimated 3 month mortality.   IV ceftriaxone.  Monitor for GI bleed.  Hold BB.   4-Acute metabolic encephalopathy; he has mild confusion.  Resume lactulose, increase dose to 45 gr TID.  Resume Rifaximin.   superficial vein thrombosis involving the left small sahenous vein.  Monitor. Would avoid NSAID>    DVT Prophylaxis: SCD Code Status: Full code  Family Communication: Wife at bedside.   Disposition Plan: Admit under observation for dehydration. Lactic acidosis, decompensated liver failure.     Time spent: 75 Minutes  Elmarie Shiley MD [facility]   05/09/2022, 6:06 PM

## 2022-04-18 NOTE — ED Notes (Signed)
Patient provided meal tray.

## 2022-04-18 NOTE — ED Notes (Signed)
Dr. Gilford Raid aware critical lactic 4.3.

## 2022-04-18 NOTE — ED Triage Notes (Addendum)
Per EMS, patient from home, c/o generalized weakness since this morning. Hx cirrhosis with paracentesis yesterday. Diuretics increased yesterday and patient reports he was up all night urinating.  BP 98/60 HR 88 96% RA

## 2022-04-18 NOTE — Progress Notes (Signed)
Left lower extremity venous duplex has been completed. Preliminary results can be found in CV Proc through chart review.  Results were given to [facility].  04/20/2022 2:51 PM Carlos Levering RVT

## 2022-04-18 NOTE — ED Provider Notes (Signed)
[facility] DEPT Provider Note   CSN: [id number] Arrival date & time: 05/01/2022  1359     History  Chief Complaint  Patient presents with   Weakness    Peter Nichols is a 76 y.o. male.  Pt is a 76 yo male with a pmhx significant for alcoholic cirrhosis, HLD, HTN, GERD, and thrombocytopenia.  Pt has had increased weakness today.  Pt said he had a thoracentesis and a paracentesis this week.  His diuretic dose was increased yesterday.  Pt has decreased his lactulose dose because he was having severe diarrhea.  Pt's wife said their bedroom is on the 2nd floor and it took a very long time to get him down the stairs because he was so weak.  Pt denies f/c.  Pt does c/o left leg swelling.       Home Medications Prior to Admission medications   Medication Sig Start Date End Date Taking? Authorizing Provider  b complex vitamins capsule Take 1 capsule by mouth daily.    [provider]  calcium carbonate (TUMS - DOSED IN MG ELEMENTAL CALCIUM) 500 MG chewable tablet Chew 1 tablet by mouth daily as needed for indigestion or heartburn.    [provider]  feeding supplement (ENSURE ENLIVE / ENSURE PLUS) LIQD Take 237 mLs by mouth daily. Patient not taking: Reported on 03/09/2022 01/02/22   Allie Bossier, MD  furosemide (LASIX) 40 MG tablet Take 40 mg by mouth daily. 01/09/22   [provider]  lactulose (CHRONULAC) 10 GM/15ML solution Take 45 mLs (30 g total) by mouth 3 (three) times daily. 01/02/22   Allie Bossier, MD  Multiple Vitamin (MULTIVITAMIN WITH MINERALS) TABS tablet Take 1 tablet by mouth daily. 05/25/21   Annita Brod, MD  Nutritional Supplements (,FEEDING SUPPLEMENT, PROSOURCE PLUS) liquid Take 30 mLs by mouth 2 (two) times daily between meals. 01/02/22   Allie Bossier, MD  pravastatin (PRAVACHOL) 20 MG tablet Take 3 tablets (60 mg total) by mouth daily. 01/03/22   Allie Bossier, MD  propranolol (INDERAL) 10 MG tablet  Take 1 tablet (10 mg total) by mouth 2 (two) times daily. 06/03/21   Elgergawy, Silver Huguenin, MD  rifaximin (XIFAXAN) 550 MG TABS tablet Take 1 tablet (550 mg total) by mouth 2 (two) times daily. 01/02/22   Allie Bossier, MD  spironolactone (ALDACTONE) 50 MG tablet Take 100 mg by mouth daily. 01/09/22   [provider]      Allergies    Other    Review of Systems   Review of Systems  Neurological:  Positive for weakness.  All other systems reviewed and are negative.   Physical Exam Updated Vital Signs BP 127/60   Pulse 96   Temp 98 F (36.7 C) (Oral)   Resp 19   Ht 5' 9"  (1.753 m)   Wt 71.2 kg   SpO2 98%   BMI 23.18 kg/m  Physical Exam Vitals and nursing note reviewed.  Constitutional:      Appearance: Normal appearance.  HENT:     Head: Normocephalic and atraumatic.     Right Ear: External ear normal.     Left Ear: External ear normal.     Nose: Nose normal.     Mouth/Throat:     Mouth: Mucous membranes are dry.  Eyes:     General: Scleral icterus present.     Extraocular Movements: Extraocular movements intact.     Conjunctiva/sclera: Conjunctivae normal.  Pupils: Pupils are equal, round, and reactive to light.  Cardiovascular:     Rate and Rhythm: Normal rate and regular rhythm.     Pulses: Normal pulses.     Heart sounds: Normal heart sounds.  Pulmonary:     Effort: Pulmonary effort is normal.     Breath sounds: Normal breath sounds.  Abdominal:     General: Abdomen is flat. Bowel sounds are normal.     Palpations: Abdomen is soft.  Genitourinary:    Rectum: Guaiac result negative.  Musculoskeletal:        General: Normal range of motion.     Cervical back: Normal range of motion and neck supple.     Left lower leg: Edema present.  Skin:    Coloration: Skin is jaundiced.     Comments: Stage 2 pressure ulcer sacrum  Neurological:     General: No focal deficit present.     Mental Status: He is alert and oriented to person, place, and time.   Psychiatric:        Mood and Affect: Mood normal.        Behavior: Behavior normal.     ED Results / Procedures / Treatments   Labs (all labs ordered are listed, but only abnormal results are displayed) Labs Reviewed  CBC - Abnormal; Notable for the following components:      Result Value   RBC 3.14 (*)    Hemoglobin 11.1 (*)    HCT 32.4 (*)    MCV 103.2 (*)    MCH 35.4 (*)    Platelets 85 (*)    All other components within normal limits  LACTIC ACID, PLASMA - Abnormal; Notable for the following components:   Lactic Acid, Venous 3.1 (*)    All other components within normal limits  LACTIC ACID, PLASMA - Abnormal; Notable for the following components:   Lactic Acid, Venous 4.3 (*)    All other components within normal limits  AMMONIA - Abnormal; Notable for the following components:   Ammonia 119 (*)    All other components within normal limits  COMPREHENSIVE METABOLIC PANEL - Abnormal; Notable for the following components:   Sodium 133 (*)    CO2 20 (*)    Glucose, Bld 115 (*)    Creatinine, Ser 1.32 (*)    Total Protein 5.5 (*)    Albumin 3.4 (*)    AST 49 (*)    Total Bilirubin 10.4 (*)    GFR, Estimated 56 (*)    All other components within normal limits  CBG MONITORING, ED - Abnormal; Notable for the following components:   Glucose-Capillary 125 (*)    All other components within normal limits  URINE CULTURE  BRAIN NATRIURETIC PEPTIDE  URINALYSIS, ROUTINE W REFLEX MICROSCOPIC  PROTIME-INR  MAGNESIUM  POC OCCULT BLOOD, ED  TROPONIN I (HIGH SENSITIVITY)    EKG EKG Interpretation  Date/Time:  Saturday April 18 2022 14:11:08 EDT Ventricular Rate:  90 PR Interval:  164 QRS Duration: 88 QT Interval:  401 QTC Calculation: 491 R Axis:   64 Text Interpretation: Sinus rhythm Probable left atrial enlargement Anteroseptal infarct, age indeterminate since last tracing no significant change Confirmed by Daleen Bo [phone]) on 05/17/2022 2:30:00 PM  Radiology DG  Chest 2 View  Result Date: 04/29/2022 CLINICAL DATA:  Generalized weakness since this morning, cirrhosis post paracentesis yesterday, diuretics increased yesterday, patient reports up all night urinating. History hypertension, NASH, GERD EXAM: CHEST - 2 VIEW COMPARISON:  04/15/2022 FINDINGS:  Normal heart size, mediastinal contours, and pulmonary vascularity. Lungs clear. No pulmonary infiltrate, pleural effusion, or pneumothorax. Scattered endplate spur formation thoracic spine. IMPRESSION: No acute abnormalities. Electronically Signed   By: Lavonia Dana M.D.   On: 05/05/2022 15:13   VAS [place] LOWER EXTREMITY VENOUS (DVT) (7a-7p)  Result Date: 04/26/2022  Lower Venous DVT Study Patient Name:  HERNANDEZ LOSASSO  Date of Exam:   05/08/2022 Medical Rec #: [id number]        Accession #:    [id number] Date of Birth: Jul 13, 1946         Patient Gender: M Patient Age:   64 years Exam Location:  [facility] Procedure:      VAS [place] LOWER EXTREMITY VENOUS (DVT) Referring Phys: Ova Freshwater FONDAW --------------------------------------------------------------------------------  Indications: Pain.  Risk Factors: None identified. Limitations: Poor ultrasound/tissue interface. Comparison Study: No prior studies. Performing Technologist: Oliver Hum RVT  Examination Guidelines: A complete evaluation includes B-mode imaging, spectral Doppler, color Doppler, and power Doppler as needed of all accessible portions of each vessel. Bilateral testing is considered an integral part of a complete examination. Limited examinations for reoccurring indications may be performed as noted. The reflux portion of the exam is performed with the patient in reverse Trendelenburg.  +-----+---------------+---------+-----------+----------+--------------+ RIGHTCompressibilityPhasicitySpontaneityPropertiesThrombus Aging +-----+---------------+---------+-----------+----------+--------------+ CFV  Full           Yes      Yes                                  +-----+---------------+---------+-----------+----------+--------------+   +---------+---------------+---------+-----------+----------+-----------------+ LEFT     CompressibilityPhasicitySpontaneityPropertiesThrombus Aging    +---------+---------------+---------+-----------+----------+-----------------+ CFV      Full           Yes      Yes                                    +---------+---------------+---------+-----------+----------+-----------------+ SFJ      Full                                                           +---------+---------------+---------+-----------+----------+-----------------+ FV Prox  Full                                                           +---------+---------------+---------+-----------+----------+-----------------+ FV Mid   Full                                                           +---------+---------------+---------+-----------+----------+-----------------+ FV DistalFull                                                           +---------+---------------+---------+-----------+----------+-----------------+  PFV      Full                                                           +---------+---------------+---------+-----------+----------+-----------------+ POP      Full           Yes      Yes                                    +---------+---------------+---------+-----------+----------+-----------------+ PTV      Full                                                           +---------+---------------+---------+-----------+----------+-----------------+ PERO     Full                                                           +---------+---------------+---------+-----------+----------+-----------------+ SSV      None                                         Age Indeterminate +---------+---------------+---------+-----------+----------+-----------------+ The thrombus noted in the short saphenous vein  extends from the proximal calf into the confluence with the popliteal vein. The thrombus does not appear to extend into the popliteal vein.   Summary: RIGHT: - No evidence of common femoral vein obstruction.  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 - There is no evidence of deep vein thrombosis in the lower extremity.  - No cystic structure found in the popliteal fossa. - The thrombus noted in the short saphenous vein extends from the proximal calf into the confluence with the popliteal vein. The thrombus does not appear to extend into the popliteal vein.  *See table(s) above for measurements and observations.    Preliminary    IR Paracentesis  Result Date: 04/17/2022 INDICATION: Patient with history of cirrhosis with recurrent ascites. TIPS procedure on 5.30.23 aborted due to elevated right heart pressures. EXAM: ULTRASOUND GUIDED THERAPEUTIC PARACENTESIS MEDICATIONS: Lidocaine 1% 10 mL COMPLICATIONS: None immediate. PROCEDURE: Informed written consent was obtained from the patient after a discussion of the risks, benefits and alternatives to treatment. A timeout was performed prior to the initiation of the procedure. Initial ultrasound scanning demonstrates a moderate amount of ascites within the right lower abdominal quadrant. The right lower abdomen was prepped and draped in the usual sterile fashion. 1% lidocaine was used for local anesthesia. Following this, a 19 gauge, 7-cm, Yueh catheter was introduced. An ultrasound image was saved for documentation purposes. The paracentesis was performed. The catheter was removed and a dressing was applied. The patient tolerated the procedure well without immediate post procedural complication. Patient received post-procedure intravenous albumin; see nursing notes for details. FINDINGS: A total of approximately  4.5 of straw-colored fluid was removed. IMPRESSION: Successful ultrasound-guided  therapeutic paracentesis yielding 4.5 liters of peritoneal fluid. Read by: Rushie Nyhan, NP Electronically Signed   By: Miachel Roux M.D.   On: 04/17/2022 16:16    Procedures Procedures    Medications Ordered in ED Medications  cefTRIAXone (ROCEPHIN) 2 g in sodium chloride 0.9 % 100 mL IVPB (has no administration in time range)  lactulose (CHRONULAC) 10 GM/15ML solution 20 g (has no administration in time range)  sodium chloride 0.9 % bolus 1,000 mL (1,000 mLs Intravenous New Bag/Given 05/18/2022 1655)  lactulose (CHRONULAC) 10 GM/15ML solution 20 g (20 g Oral Given 04/25/2022 1656)    ED Course/ Medical Decision Making/ A&P Clinical Course as of 04/23/2022 1732  Sat Apr 18, 2022  1455 DVT negative - does have superficial thrombus per technician [WF]    Clinical Course User Index [WF] Tedd Sias, PA                           Medical Decision Making Amount and/or Complexity of Data Reviewed Labs: ordered.  Risk Prescription drug management. Decision regarding hospitalization.   This patient presents to the ED for concern of weakness, this involves an extensive number of treatment options, and is a complaint that carries with it a high risk of complications and morbidity.  The differential diagnosis includes electrolyte abn, infection, hepatic encephalopathy   Co morbidities that complicate the patient evaluation  alcoholic cirrhosis, HLD, HTN, GERD, and thrombocytopenia   Additional history obtained:  Additional history obtained from epic chart review External records from outside source obtained and reviewed including wife   Lab Tests:  I Ordered, and personally interpreted labs.  The pertinent results include:  cbc with hgb low at 11.1 (hgb 13.1 on 6/22) and plt low at 85 (>76 on 6/22 +clumping), cmp with Cr elevated at 1.32 (Cr 1.27 on 6/22), TB elevated at 10.4 (7.2 on 6/22), lactic elevated at 3.1 and ammonia elevated at 119 (52 on 3/16); 2nd lactic 4.3 (however,  this was before fluids were started)   Imaging Studies ordered:  I ordered imaging studies including CXR and [place]  I independently visualized and interpreted imaging which showed  CXR: IMPRESSION:  No acute abnormalities.  LLE [place]:    Summary:  RIGHT:  - No evidence of common femoral vein obstruction.     LEFT:  - Findings consistent with age indeterminate superficial vein thrombosis involving the left small sahenous vein.  - There is no evidence of deep vein thrombosis in the lower extremity.     - No cystic structure found in the popliteal fossa.  - The thrombus noted in the short saphenous vein extends from the proximal calf into the confluence with the popliteal vein. The thrombus does not appear to extend into the popliteal vein.   I agree with the radiologist interpretation   Cardiac Monitoring:  The patient was maintained on a cardiac monitor.  I personally viewed and interpreted the cardiac monitored which showed an underlying rhythm of: nsr   Medicines ordered and prescription drug management:  I ordered medication including lactulose  for elevated ammonia and IVFs for elevated LA  Reevaluation of the patient after these medicines showed that the patient improved I have reviewed the patients home medicines and have made adjustments as needed   Test Considered:  ct   Critical Interventions:  ivfs   Consultations Obtained:  I requested consultation with the hospitalists (  Dr. Tyrell Antonio),  and discussed lab and imaging findings as well as pertinent plan - she will admit.  She requested I let GI know pt is getting admitted.  I messaged Dr. Watt Climes and he is aware.  Pt is stable, so I don't think he needs to be seen by GI tonight.   Problem List / ED Course:  Hepatic encephalopathy:  pt given lactulose Weakness:  dehydration with increased dose in diuretics and recent thoracentesis and paracentesis Cirrhosis:  worsening bilirubin Superficial venous thrombosis:   normally, I would start ASA.  However, plt are low, so I will hold off. Lactic acidosis:  likely due to dehydration   Reevaluation:  After the interventions noted above, I reevaluated the patient and found that they have :improved   Social Determinants of Health:  Lives at home   Dispostion:  After consideration of the diagnostic results and the patients response to treatment, I feel that the patent would benefit from admission.    CRITICAL CARE Performed by: Isla Pence   Total critical care time: 30 minutes  Critical care time was exclusive of separately billable procedures and treating other patients.  Critical care was necessary to treat or prevent imminent or life-threatening deterioration.  Critical care was time spent personally by me on the following activities: development of treatment plan with patient and/or surrogate as well as nursing, discussions with consultants, evaluation of patient's response to treatment, examination of patient, obtaining history from patient or surrogate, ordering and performing treatments and interventions, ordering and review of laboratory studies, ordering and review of radiographic studies, pulse oximetry and re-evaluation of patient's condition.         Final Clinical Impression(s) / ED Diagnoses Final diagnoses:  Weakness  Dehydration  Hepatic encephalopathy (HCC)  Alcoholic cirrhosis of liver with ascites ([place])  Thrombocytopenia (HCC)  Superficial vein thrombosis  Pressure injury of sacral region, stage 2 (HCC)  Lactic acidosis    Rx / DC Orders ED Discharge Orders     None         Isla Pence, MD 04/25/2022 1732

## 2022-04-18 NOTE — ED Provider Triage Note (Incomplete)
Patient with a history of alcoholic cirrhosis thrombocytopenia presenting with weakness seems to be worse today.  History of recent thoracentesis and paracentesis and on diuretics seems to have issue with fluid overload historically.  He also has significant left lower extremity swelling which she did not notice until I mentioned on my physical exam.  Labs, ammonia ordered and chest x-ray and lower extremity ultrasound.   Tedd Sias, [place] 04/19/22 1038    Pati Gallo [place], [place] 04/19/22 1042

## 2022-04-19 ENCOUNTER — Observation Stay (HOSPITAL_COMMUNITY): Payer: Medicare HMO

## 2022-04-19 ENCOUNTER — Encounter (HOSPITAL_COMMUNITY): Payer: Self-pay | Admitting: Internal Medicine

## 2022-04-19 DIAGNOSIS — K7460 Unspecified cirrhosis of liver: Secondary | ICD-10-CM | POA: Diagnosis not present

## 2022-04-19 DIAGNOSIS — R059 Cough, unspecified: Secondary | ICD-10-CM | POA: Diagnosis not present

## 2022-04-19 DIAGNOSIS — R5381 Other malaise: Secondary | ICD-10-CM | POA: Diagnosis not present

## 2022-04-19 DIAGNOSIS — Z515 Encounter for palliative care: Secondary | ICD-10-CM | POA: Diagnosis not present

## 2022-04-19 DIAGNOSIS — D638 Anemia in other chronic diseases classified elsewhere: Secondary | ICD-10-CM | POA: Diagnosis present

## 2022-04-19 DIAGNOSIS — D696 Thrombocytopenia, unspecified: Secondary | ICD-10-CM | POA: Diagnosis not present

## 2022-04-19 DIAGNOSIS — K7682 Hepatic encephalopathy: Secondary | ICD-10-CM | POA: Diagnosis not present

## 2022-04-19 DIAGNOSIS — E871 Hypo-osmolality and hyponatremia: Secondary | ICD-10-CM | POA: Diagnosis not present

## 2022-04-19 DIAGNOSIS — D6959 Other secondary thrombocytopenia: Secondary | ICD-10-CM | POA: Diagnosis present

## 2022-04-19 DIAGNOSIS — Z66 Do not resuscitate: Secondary | ICD-10-CM | POA: Diagnosis not present

## 2022-04-19 DIAGNOSIS — R64 Cachexia: Secondary | ICD-10-CM | POA: Diagnosis present

## 2022-04-19 DIAGNOSIS — I10 Essential (primary) hypertension: Secondary | ICD-10-CM | POA: Diagnosis present

## 2022-04-19 DIAGNOSIS — K567 Ileus, unspecified: Secondary | ICD-10-CM | POA: Diagnosis not present

## 2022-04-19 DIAGNOSIS — F1021 Alcohol dependence, in remission: Secondary | ICD-10-CM | POA: Diagnosis present

## 2022-04-19 DIAGNOSIS — K7040 Alcoholic hepatic failure without coma: Secondary | ICD-10-CM | POA: Diagnosis present

## 2022-04-19 DIAGNOSIS — I959 Hypotension, unspecified: Secondary | ICD-10-CM | POA: Diagnosis not present

## 2022-04-19 DIAGNOSIS — D649 Anemia, unspecified: Secondary | ICD-10-CM | POA: Diagnosis not present

## 2022-04-19 DIAGNOSIS — J948 Other specified pleural conditions: Secondary | ICD-10-CM | POA: Diagnosis not present

## 2022-04-19 DIAGNOSIS — R0602 Shortness of breath: Secondary | ICD-10-CM | POA: Diagnosis not present

## 2022-04-19 DIAGNOSIS — K7290 Hepatic failure, unspecified without coma: Secondary | ICD-10-CM | POA: Diagnosis not present

## 2022-04-19 DIAGNOSIS — D689 Coagulation defect, unspecified: Secondary | ICD-10-CM | POA: Diagnosis not present

## 2022-04-19 DIAGNOSIS — L89152 Pressure ulcer of sacral region, stage 2: Secondary | ICD-10-CM | POA: Diagnosis present

## 2022-04-19 DIAGNOSIS — K3189 Other diseases of stomach and duodenum: Secondary | ICD-10-CM | POA: Diagnosis not present

## 2022-04-19 DIAGNOSIS — G9341 Metabolic encephalopathy: Secondary | ICD-10-CM | POA: Diagnosis present

## 2022-04-19 DIAGNOSIS — Z7189 Other specified counseling: Secondary | ICD-10-CM | POA: Diagnosis not present

## 2022-04-19 DIAGNOSIS — I272 Pulmonary hypertension, unspecified: Secondary | ICD-10-CM | POA: Diagnosis present

## 2022-04-19 DIAGNOSIS — Z8679 Personal history of other diseases of the circulatory system: Secondary | ICD-10-CM | POA: Diagnosis not present

## 2022-04-19 DIAGNOSIS — R188 Other ascites: Secondary | ICD-10-CM | POA: Diagnosis not present

## 2022-04-19 DIAGNOSIS — Z0389 Encounter for observation for other suspected diseases and conditions ruled out: Secondary | ICD-10-CM | POA: Diagnosis not present

## 2022-04-19 DIAGNOSIS — E43 Unspecified severe protein-calorie malnutrition: Secondary | ICD-10-CM | POA: Diagnosis present

## 2022-04-19 DIAGNOSIS — I82812 Embolism and thrombosis of superficial veins of left lower extremities: Secondary | ICD-10-CM | POA: Diagnosis present

## 2022-04-19 DIAGNOSIS — I8510 Secondary esophageal varices without bleeding: Secondary | ICD-10-CM | POA: Diagnosis present

## 2022-04-19 DIAGNOSIS — E872 Acidosis, unspecified: Secondary | ICD-10-CM | POA: Diagnosis present

## 2022-04-19 DIAGNOSIS — J90 Pleural effusion, not elsewhere classified: Secondary | ICD-10-CM | POA: Diagnosis not present

## 2022-04-19 DIAGNOSIS — D61818 Other pancytopenia: Secondary | ICD-10-CM | POA: Diagnosis present

## 2022-04-19 DIAGNOSIS — K7031 Alcoholic cirrhosis of liver with ascites: Secondary | ICD-10-CM | POA: Diagnosis not present

## 2022-04-19 DIAGNOSIS — K6389 Other specified diseases of intestine: Secondary | ICD-10-CM | POA: Diagnosis not present

## 2022-04-19 DIAGNOSIS — Z9049 Acquired absence of other specified parts of digestive tract: Secondary | ICD-10-CM | POA: Diagnosis not present

## 2022-04-19 DIAGNOSIS — K5730 Diverticulosis of large intestine without perforation or abscess without bleeding: Secondary | ICD-10-CM | POA: Diagnosis not present

## 2022-04-19 DIAGNOSIS — N179 Acute kidney failure, unspecified: Secondary | ICD-10-CM | POA: Diagnosis present

## 2022-04-19 DIAGNOSIS — K766 Portal hypertension: Secondary | ICD-10-CM | POA: Diagnosis not present

## 2022-04-19 DIAGNOSIS — G9340 Encephalopathy, unspecified: Secondary | ICD-10-CM | POA: Diagnosis not present

## 2022-04-19 DIAGNOSIS — R531 Weakness: Secondary | ICD-10-CM | POA: Diagnosis not present

## 2022-04-19 LAB — ECHOCARDIOGRAM COMPLETE
Area-P 1/2: 3.88 cm2
Calc EF: 75.9 %
Height: 69 in
S' Lateral: 2.7 cm
Single Plane A2C EF: 75.6 %
Single Plane A4C EF: 78 %
Weight: 2585.55 oz

## 2022-04-19 LAB — URINALYSIS, ROUTINE W REFLEX MICROSCOPIC
Bilirubin Urine: NEGATIVE
Glucose, UA: NEGATIVE mg/dL
Hgb urine dipstick: NEGATIVE
Ketones, ur: NEGATIVE mg/dL
Leukocytes,Ua: NEGATIVE
Nitrite: NEGATIVE
Protein, ur: NEGATIVE mg/dL
Specific Gravity, Urine: 1.019 (ref 1.005–1.030)
pH: 5 (ref 5.0–8.0)

## 2022-04-19 LAB — CBC
HCT: 28.6 % — ABNORMAL LOW (ref 39.0–52.0)
Hemoglobin: 10 g/dL — ABNORMAL LOW (ref 13.0–17.0)
MCH: 35.3 pg — ABNORMAL HIGH (ref 26.0–34.0)
MCHC: 35 g/dL (ref 30.0–36.0)
MCV: 101.1 fL — ABNORMAL HIGH (ref 80.0–100.0)
Platelets: 75 10*3/uL — ABNORMAL LOW (ref 150–400)
RBC: 2.83 MIL/uL — ABNORMAL LOW (ref 4.22–5.81)
RDW: 15.1 % (ref 11.5–15.5)
WBC: 4.7 10*3/uL (ref 4.0–10.5)
nRBC: 0 % (ref 0.0–0.2)

## 2022-04-19 LAB — BODY FLUID CELL COUNT WITH DIFFERENTIAL
Eos, Fluid: 0 %
Lymphs, Fluid: 50 %
Monocyte-Macrophage-Serous Fluid: 42 % — ABNORMAL LOW (ref 50–90)
Neutrophil Count, Fluid: 8 % (ref 0–25)
Total Nucleated Cell Count, Fluid: 120 cu mm (ref 0–1000)

## 2022-04-19 LAB — GLUCOSE, CAPILLARY
Glucose-Capillary: 127 mg/dL — ABNORMAL HIGH (ref 70–99)
Glucose-Capillary: 122 mg/dL — ABNORMAL HIGH (ref 70–99)
Glucose-Capillary: 152 mg/dL — ABNORMAL HIGH (ref 70–99)
Glucose-Capillary: 169 mg/dL — ABNORMAL HIGH (ref 70–99)
Glucose-Capillary: 126 mg/dL — ABNORMAL HIGH (ref 70–99)
Glucose-Capillary: 122 mg/dL — ABNORMAL HIGH (ref 70–99)

## 2022-04-19 LAB — LACTATE DEHYDROGENASE, PLEURAL OR PERITONEAL FLUID: LD, Fluid: 34 U/L — ABNORMAL HIGH (ref 3–23)

## 2022-04-19 LAB — COMPREHENSIVE METABOLIC PANEL
ALT: 38 U/L (ref 0–44)
AST: 40 U/L (ref 15–41)
Albumin: 3.1 g/dL — ABNORMAL LOW (ref 3.5–5.0)
Alkaline Phosphatase: 52 U/L (ref 38–126)
Anion gap: 10 (ref 5–15)
BUN: 23 mg/dL (ref 8–23)
CO2: 23 mmol/L (ref 22–32)
Calcium: 9.4 mg/dL (ref 8.9–10.3)
Chloride: 100 mmol/L (ref 98–111)
Creatinine, Ser: 1.21 mg/dL (ref 0.61–1.24)
GFR, Estimated: >60 mL/min (ref >60)
Glucose, Bld: 134 mg/dL — ABNORMAL HIGH (ref 70–99)
Potassium: 3.1 mmol/L — ABNORMAL LOW (ref 3.5–5.1)
Sodium: 133 mmol/L — ABNORMAL LOW (ref 135–145)
Total Bilirubin: 7.5 mg/dL — ABNORMAL HIGH (ref 0.3–1.2)
Total Protein: 5.1 g/dL — ABNORMAL LOW (ref 6.5–8.1)

## 2022-04-19 LAB — PROTIME-INR
INR: 2.1 — ABNORMAL HIGH (ref 0.8–1.2)
Prothrombin Time: 23.2 s — ABNORMAL HIGH (ref 11.4–15.2)

## 2022-04-19 LAB — PROTEIN, PLEURAL OR PERITONEAL FLUID: Total protein, fluid: <3 g/dL

## 2022-04-19 LAB — PROCALCITONIN: Procalcitonin: 0.18 ng/mL

## 2022-04-19 LAB — LACTIC ACID, PLASMA: Lactic Acid, Venous: 2.6 mmol/L (ref 0.5–1.9)

## 2022-04-19 LAB — AMMONIA: Ammonia: 96 umol/L — ABNORMAL HIGH (ref 9–35)

## 2022-04-19 MED ORDER — LACTULOSE 10 GM/15ML PO SOLN
30.0000 g | Freq: Three times a day (TID) | ORAL | Status: DC
  Administered 2022-04-19 – 2022-04-26 (×19): 30 g via ORAL
  Filled 2022-04-19 (×19): qty 45

## 2022-04-19 MED ORDER — ALBUMIN HUMAN 25 % IV SOLN
25.0000 g | Freq: Four times a day (QID) | INTRAVENOUS | Status: AC
  Administered 2022-04-19 – 2022-04-20 (×3): 25 g via INTRAVENOUS
  Filled 2022-04-19 (×3): qty 100

## 2022-04-19 MED ORDER — NOREPINEPHRINE 4 MG/250ML-% IV SOLN
2.0000 ug/min | INTRAVENOUS | Status: DC
  Administered 2022-04-19: 2 ug/min via INTRAVENOUS
  Filled 2022-04-19: qty 250

## 2022-04-19 MED ORDER — POTASSIUM CHLORIDE CRYS ER 20 MEQ PO TBCR
40.0000 meq | EXTENDED_RELEASE_TABLET | Freq: Once | ORAL | Status: AC
  Administered 2022-04-19: 40 meq via ORAL
  Filled 2022-04-19: qty 2

## 2022-04-19 MED ORDER — BISACODYL 10 MG RE SUPP
10.0000 mg | Freq: Once | RECTAL | Status: AC
Start: 2022-04-19 — End: 2022-04-19
  Administered 2022-04-19: 10 mg via RECTAL
  Filled 2022-04-19: qty 1

## 2022-04-19 MED ORDER — ALBUMIN HUMAN 25 % IV SOLN: 25.0000 g | Freq: Once | INTRAVENOUS | Status: DC

## 2022-04-19 MED ORDER — ALBUMIN HUMAN 5 % IV SOLN
25.0000 g | Freq: Once | INTRAVENOUS | Status: AC
  Administered 2022-04-19: 25 g via INTRAVENOUS
  Filled 2022-04-19: qty 500

## 2022-04-19 MED ORDER — ALBUMIN HUMAN 25 % IV SOLN
25.0000 g | Freq: Four times a day (QID) | INTRAVENOUS | Status: DC
Start: 2022-04-19 — End: 2022-04-19

## 2022-04-19 MED ORDER — SODIUM CHLORIDE 0.9 % IV BOLUS
250.0000 mL | Freq: Once | INTRAVENOUS | Status: AC
  Administered 2022-04-19: 250 mL via INTRAVENOUS

## 2022-04-19 MED ORDER — SODIUM CHLORIDE 0.9 % IV SOLN
250.0000 mL | INTRAVENOUS | Status: DC
  Administered 2022-04-19 – 2022-04-22 (×5): 250 mL via INTRAVENOUS

## 2022-04-19 MED ORDER — LIDOCAINE HCL 1 % IJ SOLN
INTRAMUSCULAR | Status: AC
  Administered 2022-04-19: 10 mL
  Filled 2022-04-19: qty 20

## 2022-04-19 NOTE — Consult Note (Signed)
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 male.  HPI: Patient seen and examined and case discussed with his wife who provides the majority of the history in his hospital computer chart and our office computer chart reviewed and he did have a paracentesis on Friday although no studies were done and last week in our office his lactulose was decreased due to diarrhea and his diuretics were increased due to increased fluid and the wife thinks that is what played a role with him becoming more encephalopathic and he has not had any fever or any other new complaints and he was turned down to [facility] for transplant but is getting a second opinion at [facility] in a few weeks and he is currently doing a little better today without any new complaints  Past Medical History:  Diagnosis Date   Cirrhosis ([place])    Dysphagia    no current problems   GERD (gastroesophageal reflux disease)    Hyperlipidemia    Hypertension    Neuromuscular disorder ([place])    tremor   Steatohepatitis, non-alcoholic    Thrombocytopenia ([place])     Past Surgical History:  Procedure Laterality Date   CHOLECYSTECTOMY     ESOPHAGOGASTRODUODENOSCOPY (EGD) WITH PROPOFOL Bilateral 08/06/2021   Procedure: ESOPHAGOGASTRODUODENOSCOPY (EGD) WITH PROPOFOL;  Surgeon: Arta Silence, MD;  Location: [facility];  Service: Endoscopy;  Laterality: Bilateral;   INGUINAL HERNIA REPAIR Right 12/23/2021   Procedure: RIGHT INGUINAL HERNIA REPAIR- OPEN;  Surgeon: Johnathan Hausen, MD;  Location: [facility];  Service: General;  Laterality: Right;   IR PARACENTESIS  06/16/2021   IR PARACENTESIS  07/11/2021   IR PARACENTESIS  01/29/2022   IR PARACENTESIS  02/12/2022   IR PARACENTESIS  03/17/2022   IR PARACENTESIS  04/02/2022   IR PARACENTESIS  04/10/2022   IR PARACENTESIS  04/17/2022   IR RADIOLOGIST EVAL & MGMT  03/04/2022   IR THORACENTESIS ASP PLEURAL SPACE W/IMG GUIDE   03/17/2022   IR THORACENTESIS ASP PLEURAL SPACE W/IMG GUIDE  03/30/2022   IR THORACENTESIS ASP PLEURAL SPACE W/IMG GUIDE  04/06/2022   IR THORACENTESIS ASP PLEURAL SPACE W/IMG GUIDE  04/15/2022   IR TIPS  03/17/2022   IR US GUIDE VASC ACCESS RIGHT  03/17/2022   RADIOLOGY WITH ANESTHESIA N/A 03/17/2022   Procedure: TIPS;  Surgeon: Mir, Paula Libra, MD;  Location: [place];  Service: Radiology;  Laterality: N/A;   STOMACH SURGERY      Family History  Problem Relation Age of Onset   Cancer Mother     Social History:  reports that he has never smoked. He has never used smokeless tobacco. He reports that he does not currently use alcohol. He reports that he does not use drugs.  Allergies:  Allergies  Allergen Reactions   Whey Nausea And Vomiting   Ensure Nausea And Vomiting    Medications: I have reviewed the patient's current medications.  Results for orders placed or performed during the hospital encounter of 05/17/2022 (from the past 48 hour(s))  CBC     Status: Abnormal   Collection Time: 04/29/2022  2:10 PM  Result Value Ref Range   WBC 7.0 4.0 - 10.5 K/uL   RBC 3.14 (L) 4.22 - 5.81 MIL/uL   Hemoglobin 11.1 (L) 13.0 - 17.0 g/dL   HCT 32.4 (L) 39.0 - 52.0 %   MCV 103.2 (H) 80.0 - 100.0 fL   MCH 35.4 (H) 26.0 -  34.0 pg   MCHC 34.3 30.0 - 36.0 g/dL   RDW 15.3 11.5 - 15.5 %   Platelets 85 (L) 150 - 400 K/uL    Comment: SPECIMEN CHECKED FOR CLOTS Immature Platelet Fraction may be clinically indicated, consider ordering this additional test [id number] REPEATED TO VERIFY PLATELET COUNT CONFIRMED BY SMEAR    nRBC 0.0 0.0 - 0.2 %    Comment: Performed at [facility], [place] [address], [place], [place] [address]  Troponin I (High Sensitivity)     Status: None   Collection Time: 05/12/2022  2:10 PM  Result Value Ref Range   Troponin I (High Sensitivity) 9 <18 ng/L    Comment: (NOTE) Elevated high sensitivity troponin I (hsTnI) values and significant  changes across serial  measurements may suggest ACS but many other  chronic and acute conditions are known to elevate hsTnI results.  Refer to the "Links" section for chest pain algorithms and additional  guidance. Performed at [facility], [place] [address] [place], [place] [address]   Lactic acid, plasma     Status: Abnormal   Collection Time: 05/13/2022  2:18 PM  Result Value Ref Range   Lactic Acid, Venous 3.1 (HH) 0.5 - 1.9 mmol/L    Comment: CRITICAL RESULT CALLED TO, READ BACK BY AND VERIFIED WITH: WILLIAMS,C @1537  ON 05/02/2022 BY LUZOLOP Performed at [facility], [place] [address], [place], [place] [address]   Ammonia     Status: Abnormal   Collection Time: 05/16/2022  2:19 PM  Result Value Ref Range   Ammonia 119 (H) 9 - 35 umol/L    Comment: Performed at [facility], [place] [address] [place], [place] [address]  Brain natriuretic peptide     Status: None   Collection Time: 05/01/2022  2:21 PM  Result Value Ref Range   B Natriuretic Peptide 81.6 0.0 - 100.0 pg/mL    Comment: Performed at [facility], [place] [address], [place], [place] [address]  Comprehensive metabolic panel     Status: Abnormal   Collection Time: 04/23/2022  2:22 PM  Result Value Ref Range   Sodium 133 (L) 135 - 145 mmol/L   Potassium 3.7 3.5 - 5.1 mmol/L   Chloride 98 98 - 111 mmol/L   CO2 20 (L) 22 - 32 mmol/L   Glucose, Bld 115 (H) 70 - 99 mg/dL    Comment: Glucose reference range applies only to samples taken after fasting for at least 8 hours.   BUN 23 8 - 23 mg/dL   Creatinine, Ser 1.32 (H) 0.61 - 1.24 mg/dL   Calcium 9.8 8.9 - 10.3 mg/dL   Total Protein 5.5 (L) 6.5 - 8.1 g/dL   Albumin 3.4 (L) 3.5 - 5.0 g/dL   AST 49 (H) 15 - 41 U/L   ALT 42 0 - 44 U/L   Alkaline Phosphatase 56 38 - 126 U/L   Total Bilirubin 10.4 (H) 0.3 - 1.2 mg/dL   GFR, Estimated 56 (L) >60 mL/min    Comment: (NOTE) Calculated using the CKD-EPI Creatinine Equation (2021)    Anion  gap 15 5 - 15    Comment: Performed at [facility], [place] [address] [place], [place] [address]  Lactic acid, plasma     Status: Abnormal   Collection Time: 05/05/2022  4:03 PM  Result Value Ref Range   Lactic Acid, Venous 4.3 (HH) 0.5 - 1.9 mmol/L    Comment: CRITICAL RESULT CALLED  TO, READ BACK BY AND VERIFIED WITH: LEONARD,S @1647  ON 04/27/2022 BY LUZOLOP Performed at [facility], [place] [address], [place], [place] [address]   CBG monitoring, ED     Status: Abnormal   Collection Time: 04/30/2022  4:09 PM  Result Value Ref Range   Glucose-Capillary 125 (H) 70 - 99 mg/dL    Comment: Glucose reference range applies only to samples taken after fasting for at least 8 hours.  POC occult blood, ED Provider will collect     Status: None   Collection Time: 05/12/2022  5:25 PM  Result Value Ref Range   Fecal Occult Bld NEGATIVE NEGATIVE  Protime-INR     Status: Abnormal   Collection Time: 05/08/2022  5:55 PM  Result Value Ref Range   Prothrombin Time 23.7 (H) 11.4 - 15.2 seconds   INR 2.1 (H) 0.8 - 1.2    Comment: (NOTE) INR goal varies based on device and disease states. Performed at [facility], [place] [address], [place], [place] [address]   Magnesium     Status: None   Collection Time: 04/23/2022  5:55 PM  Result Value Ref Range   Magnesium 2.0 1.7 - 2.4 mg/dL    Comment: Performed at [facility], [place] [address], [place], [place] [address]  Lactic acid, plasma     Status: Abnormal   Collection Time: 05/14/2022  7:30 PM  Result Value Ref Range   Lactic Acid, Venous 4.3 (HH) 0.5 - 1.9 mmol/L    Comment: CRITICAL VALUE NOTED.  VALUE IS CONSISTENT WITH PREVIOUSLY REPORTED AND CALLED VALUE. Performed at [facility], [place] [address] [place], [place] [address]   CBG monitoring, ED     Status: Abnormal   Collection Time: 05/06/2022  8:36 PM  Result Value Ref Range   Glucose-Capillary 193 (H) 70 - 99 mg/dL     Comment: Glucose reference range applies only to samples taken after fasting for at least 8 hours.  MRSA Next Gen by PCR, Nasal     Status: None   Collection Time: 04/20/2022  9:56 PM   Specimen: Nasal Mucosa; Nasal Swab  Result Value Ref Range   MRSA by PCR Next Gen NOT DETECTED NOT DETECTED    Comment: (NOTE) The GeneXpert MRSA Assay (FDA approved for NASAL specimens only), is one component of a comprehensive MRSA colonization surveillance program. It is not intended to diagnose MRSA infection nor to guide or monitor treatment for MRSA infections. Test performance is not FDA approved in patients less than 25 years old. Performed at [facility], [place] [address] [place], [place] [address]   Lactic acid, plasma     Status: Abnormal   Collection Time: 05/02/2022 10:29 PM  Result Value Ref Range   Lactic Acid, Venous 4.0 (HH) 0.5 - 1.9 mmol/L    Comment: CRITICAL VALUE NOTED.  VALUE IS CONSISTENT WITH PREVIOUSLY REPORTED AND CALLED VALUE. Performed at [facility], [place] [address], [place], [place] [address]   Glucose, capillary     Status: Abnormal   Collection Time: 05/13/2022 11:31 PM  Result Value Ref Range   Glucose-Capillary 140 (H) 70 - 99 mg/dL    Comment: Glucose reference range applies only to samples taken after fasting for at least 8 hours.   Comment 1 Notify RN    Comment 2 Document in Chart   Comprehensive metabolic panel     Status: Abnormal   Collection Time: 04/19/22  2:58  AM  Result Value Ref Range   Sodium 133 (L) 135 - 145 mmol/L   Potassium 3.1 (L) 3.5 - 5.1 mmol/L   Chloride 100 98 - 111 mmol/L   CO2 23 22 - 32 mmol/L   Glucose, Bld 134 (H) 70 - 99 mg/dL    Comment: Glucose reference range applies only to samples taken after fasting for at least 8 hours.   BUN 23 8 - 23 mg/dL   Creatinine, Ser 1.21 0.61 - 1.24 mg/dL   Calcium 9.4 8.9 - 10.3 mg/dL   Total Protein 5.1 (L) 6.5 - 8.1 g/dL   Albumin 3.1 (L) 3.5 - 5.0 g/dL   AST  40 15 - 41 U/L   ALT 38 0 - 44 U/L   Alkaline Phosphatase 52 38 - 126 U/L   Total Bilirubin 7.5 (H) 0.3 - 1.2 mg/dL   GFR, Estimated >60 >60 mL/min    Comment: (NOTE) Calculated using the CKD-EPI Creatinine Equation (2021)    Anion gap 10 5 - 15    Comment: Performed at [facility], [place] [address] [place], [place] [address]  CBC     Status: Abnormal   Collection Time: 04/19/22  2:58 AM  Result Value Ref Range   WBC 4.7 4.0 - 10.5 K/uL   RBC 2.83 (L) 4.22 - 5.81 MIL/uL   Hemoglobin 10.0 (L) 13.0 - 17.0 g/dL   HCT 28.6 (L) 39.0 - 52.0 %   MCV 101.1 (H) 80.0 - 100.0 fL   MCH 35.3 (H) 26.0 - 34.0 pg   MCHC 35.0 30.0 - 36.0 g/dL   RDW 15.1 11.5 - 15.5 %   Platelets 75 (L) 150 - 400 K/uL    Comment: Immature Platelet Fraction may be clinically indicated, consider ordering this additional test [id number] CONSISTENT WITH PREVIOUS RESULT REPEATED TO VERIFY    nRBC 0.0 0.0 - 0.2 %    Comment: Performed at [facility], [place] [address], [place], [place] [address]  Protime-INR     Status: Abnormal   Collection Time: 04/19/22  2:58 AM  Result Value Ref Range   Prothrombin Time 23.2 (H) 11.4 - 15.2 seconds   INR 2.1 (H) 0.8 - 1.2    Comment: (NOTE) INR goal varies based on device and disease states. Performed at [facility], [place] [address], [place], [place] [address]   Glucose, capillary     Status: Abnormal   Collection Time: 04/19/22  4:01 AM  Result Value Ref Range   Glucose-Capillary 127 (H) 70 - 99 mg/dL    Comment: Glucose reference range applies only to samples taken after fasting for at least 8 hours.   Comment 1 Notify RN    Comment 2 Document in Chart   Urinalysis, Routine w reflex microscopic Urine, In & Out Cath     Status: Abnormal   Collection Time: 04/19/22  4:05 AM  Result Value Ref Range   Color, Urine AMBER (A) YELLOW    Comment: BIOCHEMICALS MAY BE AFFECTED BY COLOR   APPearance CLEAR CLEAR   Specific  Gravity, Urine 1.019 1.005 - 1.030   pH 5.0 5.0 - 8.0   Glucose, UA NEGATIVE NEGATIVE mg/dL   Hgb urine dipstick NEGATIVE NEGATIVE   Bilirubin Urine NEGATIVE NEGATIVE   Ketones, ur NEGATIVE NEGATIVE mg/dL   Protein, ur NEGATIVE NEGATIVE mg/dL   Nitrite NEGATIVE NEGATIVE   Leukocytes,Ua NEGATIVE NEGATIVE    Comment: Performed at [facility], [place]  [address], [place], [place] [address]  Glucose, capillary     Status: Abnormal   Collection Time: 04/19/22  7:44 AM  Result Value Ref Range   Glucose-Capillary 122 (H) 70 - 99 mg/dL    Comment: Glucose reference range applies only to samples taken after fasting for at least 8 hours.    DG CHEST PORT 1 VIEW  Result Date: 04/19/2022 CLINICAL DATA:  76 year old male with history of hypotension. Lactic acidosis. EXAM: PORTABLE CHEST 1 VIEW COMPARISON:  Chest x-ray 04/19/2022. FINDINGS: Skin fold artifact projecting over the lower left hemithorax. Lung volumes are low. No consolidative airspace disease. No pleural effusions. No pneumothorax. No pulmonary nodule or mass noted. Pulmonary vasculature and the cardiomediastinal silhouette are within normal limits. IMPRESSION: 1. Low lung volumes without radiographic evidence of acute cardiopulmonary disease. Electronically Signed   By: Vinnie Langton M.D.   On: 04/19/2022 07:28   DG Chest 2 View  Result Date: 04/22/2022 CLINICAL DATA:  Generalized weakness since this morning, cirrhosis post paracentesis yesterday, diuretics increased yesterday, patient reports up all night urinating. History hypertension, NASH, GERD EXAM: CHEST - 2 VIEW COMPARISON:  04/15/2022 FINDINGS: Normal heart size, mediastinal contours, and pulmonary vascularity. Lungs clear. No pulmonary infiltrate, pleural effusion, or pneumothorax. Scattered endplate spur formation thoracic spine. IMPRESSION: No acute abnormalities. Electronically Signed   By: Lavonia Dana M.D.   On: 05/12/2022 15:13   VAS [place] LOWER EXTREMITY VENOUS  (DVT) (7a-7p)  Result Date: 05/02/2022  Lower Venous DVT Study Patient Name:  ZYLEN WENIG  Date of Exam:   04/23/2022 Medical Rec #: [id number]        Accession #:    [id number] Date of Birth: 03/31/1946         Patient Gender: M Patient Age:   25 years Exam Location:  [facility] Procedure:      VAS [place] LOWER EXTREMITY VENOUS (DVT) Referring Phys: Ova Fresh[place] FONDAW --------------------------------------------------------------------------------  Indications: Pain.  Risk Factors: None identified. Limitations: Poor ultrasound/tissue interface. Comparison Study: No prior studies. Performing Technologist: Oliver Hum RVT  Examination Guidelines: A complete evaluation includes B-mode imaging, spectral Doppler, color Doppler, and power Doppler as needed of all accessible portions of each vessel. Bilateral testing is considered an integral part of a complete examination. Limited examinations for reoccurring indications may be performed as noted. The reflux portion of the exam is performed with the patient in reverse Trendelenburg.  +-----+---------------+---------+-----------+----------+--------------+ RIGHTCompressibilityPhasicitySpontaneityPropertiesThrombus Aging +-----+---------------+---------+-----------+----------+--------------+ CFV  Full           Yes      Yes                                 +-----+---------------+---------+-----------+----------+--------------+   +---------+---------------+---------+-----------+----------+-----------------+ LEFT     CompressibilityPhasicitySpontaneityPropertiesThrombus Aging    +---------+---------------+---------+-----------+----------+-----------------+ CFV      Full           Yes      Yes                                    +---------+---------------+---------+-----------+----------+-----------------+ SFJ      Full                                                            +---------+---------------+---------+-----------+----------+-----------------+  FV Prox  Full                                                           +---------+---------------+---------+-----------+----------+-----------------+ FV Mid   Full                                                           +---------+---------------+---------+-----------+----------+-----------------+ FV DistalFull                                                           +---------+---------------+---------+-----------+----------+-----------------+ PFV      Full                                                           +---------+---------------+---------+-----------+----------+-----------------+ POP      Full           Yes      Yes                                    +---------+---------------+---------+-----------+----------+-----------------+ PTV      Full                                                           +---------+---------------+---------+-----------+----------+-----------------+ PERO     Full                                                           +---------+---------------+---------+-----------+----------+-----------------+ SSV      None                                         Age Indeterminate +---------+---------------+---------+-----------+----------+-----------------+ The thrombus noted in the short saphenous vein extends from the proximal calf into the confluence with the popliteal vein. The thrombus does not appear to extend into the popliteal vein.   Summary: RIGHT: - No evidence of common femoral vein obstruction.  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 - There is no evidence of deep vein thrombosis in the lower extremity.  - No cystic structure found in the popliteal fossa. - The thrombus noted in the short saphenous vein extends from the proximal calf into the confluence with  the popliteal vein.  The thrombus does not appear to extend into the popliteal vein.  *See table(s) above for measurements and observations.    Preliminary    IR Paracentesis  Result Date: 04/17/2022 INDICATION: Patient with history of cirrhosis with recurrent ascites. TIPS procedure on 5.30.23 aborted due to elevated right heart pressures. EXAM: ULTRASOUND GUIDED THERAPEUTIC PARACENTESIS MEDICATIONS: Lidocaine 1% 10 mL COMPLICATIONS: None immediate. PROCEDURE: Informed written consent was obtained from the patient after a discussion of the risks, benefits and alternatives to treatment. A timeout was performed prior to the initiation of the procedure. Initial ultrasound scanning demonstrates a moderate amount of ascites within the right lower abdominal quadrant. The right lower abdomen was prepped and draped in the usual sterile fashion. 1% lidocaine was used for local anesthesia. Following this, a 19 gauge, 7-cm, Yueh catheter was introduced. An ultrasound image was saved for documentation purposes. The paracentesis was performed. The catheter was removed and a dressing was applied. The patient tolerated the procedure well without immediate post procedural complication. Patient received post-procedure intravenous albumin; see nursing notes for details. FINDINGS: A total of approximately 4.5 of straw-colored fluid was removed. IMPRESSION: Successful ultrasound-guided therapeutic paracentesis yielding 4.5 liters of peritoneal fluid. Read by: Rushie Nyhan, NP Electronically Signed   By: Miachel Roux M.D.   On: 04/17/2022 16:16    ROS negative except above Blood pressure (!) 113/49, pulse 81, temperature (!) 97.5 F (36.4 C), temperature source Oral, resp. rate 15, height 5' 9"  (1.753 m), weight 73.3 kg, SpO2 97 %. Physical Exam vital signs stable afebrile no acute distress patient lethargic but answers most questions appropriately abdomen is soft obvious ascites nontender no pedal edema labs reviewed  creatinine slight decreased bili slight decrease hemoglobin slight decrease he was guaiac negative  Assessment/Plan: Cirrhosis and encephalopathy Plan: Return to usual lactulose dose and continue rifaximin and probably restart diuretics tomorrow or the next day and okay with me to return to his previous dose before they raised it in the office although my guess is decreasing the lactulose played more of a role than the diuretic therapy based on his electrolytes and BUN and creatinine not being too far off baseline and consider a diagnostic paracentesis or continue empiric therapy as you are doing for SBP and it sounds like he gets paracentesis is every Friday so could wait till then and check for cell count and differential culture cytology and albumin which should be done periodically on his paracentesis and will ask my partner Dr. Deno Etienne to check on tomorrow and please call me today if I can be of any further assistance  [facility] E 04/19/2022, 8:51 AM

## 2022-04-19 NOTE — Consult Note (Signed)
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 On 6/22 had paracentesis - unclear if albumin given but only 4.5L taken off with no labs sent, on 6/28 had thoracentesis with 800cc taken off and no labs sent, on 6/30 had another paracentesis with some albumin replacement but no labs sent. Increased confusion at home per wife. Diuretics increased recently.   TIPS attempted 5/30 but aborted due to increased RAP of 20, remained 16 after para and thora.   Here he was given a liter of crystalloid and 12.5 25% albumin. He has been hypotensive and lactic elevated around 4. Ammonia 96. Has AKI.  Pertinent  Medical History  Etoh cirrhosis  Significant Hospital Events: Including procedures, antibiotic start and stop dates in addition to other pertinent events   7/2 admitted, gentle fluid resuscitation  Interim History / Subjective:  Today he says he has some abdominal discomfort but no pain per se. Some dyspnea. Overall still feels quite weak.  Objective   Blood pressure (!) 113/49, pulse 81, temperature (!) 97.5 F (36.4 C), temperature source Oral, resp. rate 15, height 5' 9"  (1.753 m), weight 73.3 kg, SpO2 97 %.        Intake/Output Summary (Last 24 hours) at 04/19/2022 0865 Last data filed at 04/19/2022 0400 Gross per 24 hour  Intake 340.34 ml  Output 550 ml  Net -209.66 ml   Filed Weights   05/05/2022 1411 05/07/2022 2138  Weight: 71.2 kg 73.3 kg    Examination: General appearance: 76 y.o., male, NAD, chronically ill appearing Eyes: icteric sclerae; PERRL, tracking appropriately HENT: NCAT; MMM Lungs: CTAB, no crackles, no wheeze, with normal respiratory  effort CV: RRR, no murmur  Abdomen: Soft, non-tender; distended but not taut  Extremities: No peripheral edema, warm Skin: Normal turgor and texture; no rash Psych: Appropriate affect Neuro: Alert and oriented to person and place, no focal deficit, +asterixis  Bedside US with small right pleural effusion and moderate simple-appearing ascites    Resolved Hospital Problem list     Assessment & Plan:   # Hypotension # Weakness - ceftriaxone, follow cultures and narrow as able - recommend diagnostic paracentesis - albumin challenge to 100g over 24 hours as below for both AKI and possibility of SBP with AKI. I've ordered 25g of 5% albumin, if he tolerates it the remainder I'll order as albumin 25% spaced over the next 24h. 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 Possibility of SBP. If fails to improve with albumin challenge, consideration of HRS. - albumin challenge as above  # Lactic acidosis At  risk for poor clearance due to cirrhosis but improved with fluid resuscitation - would not recheck unless there's decompensation  # TCP  - chronic   Best Practice (right click and "Reselect all SmartList Selections" daily)   Per [facility]  Labs   CBC: Recent Labs  Lab 04/26/2022 1410 04/19/22 0258  WBC 7.0 4.7  HGB 11.1* 10.0*  HCT 32.4* 28.6*  MCV 103.2*  101.1*  PLT 85* 75*    Basic Metabolic Panel: Recent Labs  Lab 04/25/2022 1422 04/20/2022 1755 04/19/22 0258  NA 133*  --  133*  K 3.7  --  3.1*  CL 98  --  100  CO2 20*  --  23  GLUCOSE 115*  --  134*  BUN 23  --  23  CREATININE 1.32*  --  1.21  CALCIUM 9.8  --  9.4  MG  --  2.0  --    GFR: Estimated Creatinine Clearance: 52.7 mL/min (by C-G formula based on SCr of 1.21 mg/dL). Recent Labs  Lab 05/07/2022 1410 04/20/2022 1418 04/29/2022 1603 05/05/2022 1930 05/08/2022 2229 04/19/22 0258  WBC 7.0  --   --   --   --  4.7  LATICACIDVEN  --  3.1* 4.3* 4.3* 4.0*  --     Liver Function Tests: Recent Labs  Lab 05/15/2022 1422 04/19/22 0258  AST 49* 40  ALT 42 38  ALKPHOS 56 52  BILITOT 10.4* 7.5*  PROT 5.5* 5.1*  ALBUMIN 3.4* 3.1*   No results for input(s): "LIPASE", "AMYLASE" in the last 168 hours. Recent Labs  Lab 05/15/2022 1419 04/19/22 0747  AMMONIA 119* 96*    ABG    Component Value Date/Time   TCO2 27 08/06/2021 0832     Coagulation Profile: Recent Labs  Lab 05/14/2022 1755 04/19/22 0258  INR 2.1* 2.1*    Cardiac Enzymes: No results for input(s): "CKTOTAL", "CKMB", "CKMBINDEX", "TROPONINI" in the last 168 hours.  HbA1C: No results found for: "HGBA1C"  CBG: Recent Labs  Lab 04/25/2022 1609 04/23/2022 2036 04/27/2022 2331 04/19/22 0401 04/19/22 0744  GLUCAP 125* 193* 140* 127* 122*    Review of Systems:   12 point review of systems is negative except as in HPI  Past Medical History:  He,  has a past medical history of Cirrhosis ([place]), Dysphagia, GERD (gastroesophageal reflux disease), Hyperlipidemia, Hypertension, Neuromuscular disorder ([place]), Steatohepatitis, non-alcoholic, and Thrombocytopenia ([place]).   Surgical History:   Past Surgical History:  Procedure Laterality Date   CHOLECYSTECTOMY     ESOPHAGOGASTRODUODENOSCOPY (EGD) WITH PROPOFOL Bilateral 08/06/2021   Procedure: ESOPHAGOGASTRODUODENOSCOPY (EGD) WITH PROPOFOL;  Surgeon: Arta Silence,  MD;  Location: [facility];  Service: Endoscopy;  Laterality: Bilateral;   INGUINAL HERNIA REPAIR Right 12/23/2021   Procedure: RIGHT INGUINAL HERNIA REPAIR- OPEN;  Surgeon: Johnathan Hausen, MD;  Location: [facility];  Service: General;  Laterality: Right;   IR PARACENTESIS  06/16/2021   IR PARACENTESIS  07/11/2021   IR PARACENTESIS  01/29/2022   IR PARACENTESIS  02/12/2022   IR PARACENTESIS  03/17/2022   IR PARACENTESIS  04/02/2022   IR PARACENTESIS  04/10/2022   IR PARACENTESIS  04/17/2022   IR RADIOLOGIST EVAL & MGMT  03/04/2022   IR THORACENTESIS ASP PLEURAL SPACE W/IMG GUIDE  03/17/2022   IR THORACENTESIS ASP PLEURAL SPACE W/IMG GUIDE  03/30/2022   IR THORACENTESIS ASP PLEURAL SPACE W/IMG GUIDE  04/06/2022   IR THORACENTESIS ASP PLEURAL SPACE W/IMG GUIDE  04/15/2022   IR TIPS  03/17/2022   IR  US GUIDE VASC ACCESS RIGHT  03/17/2022   RADIOLOGY WITH ANESTHESIA N/A 03/17/2022   Procedure: TIPS;  Surgeon: Mir, Paula Libra, MD;  Location: [place];  Service: Radiology;  Laterality: N/A;   STOMACH SURGERY       Social History:   reports that he has never smoked. He has never used smokeless tobacco. He reports that he does not currently use alcohol. He reports that he does not use drugs.   Family History:  His family history includes Cancer in his mother.   Allergies Allergies  Allergen Reactions   Whey Nausea And Vomiting   Ensure Nausea And Vomiting     Home Medications  Prior to Admission medications   Medication Sig Start Date End Date Taking? Authorizing Provider  b complex vitamins capsule Take 1 capsule by mouth daily.   Yes [provider]  calcium carbonate (TUMS - DOSED IN MG ELEMENTAL CALCIUM) 500 MG chewable tablet Chew 1 tablet by mouth daily as needed for indigestion or heartburn.   Yes [provider]  fluticasone (FLONASE) 50 MCG/ACT nasal spray Place 1-2 sprays into both nostrils daily as needed for allergies or rhinitis.   Yes [provider]  furosemide  (LASIX) 40 MG tablet Take 40 mg by mouth daily. 01/09/22  Yes [provider]  lactulose (CHRONULAC) 10 GM/15ML solution Take 45 mLs (30 g total) by mouth 3 (three) times daily. Patient taking differently: Take 30 g by mouth 2 (two) times daily. 01/02/22  Yes Allie Bossier, MD  Multiple Vitamins-Minerals (MULTIVITAMIN MEN 50+) TABS Take 1 tablet by mouth daily with breakfast.   Yes [provider]  pantoprazole (PROTONIX) 40 MG tablet Take 40 mg by mouth daily before breakfast.   Yes [provider]  pravastatin (PRAVACHOL) 40 MG tablet Take 40 mg by mouth daily.   Yes [provider]  Protein POWD Take 1 Scoop by mouth See admin instructions. Unnamed WHEY-FREE protein powder: Mix 1 scoopful of powder into the appropriate amount of liquid and drink once a day as needed for nutritional support   Yes [provider]  rifaximin (XIFAXAN) 550 MG TABS tablet Take 1 tablet (550 mg total) by mouth 2 (two) times daily. 01/02/22  Yes Allie Bossier, MD  spironolactone (ALDACTONE) 50 MG tablet Take 150 mg by mouth daily. 01/09/22  Yes [provider]  feeding supplement (ENSURE ENLIVE / ENSURE PLUS) LIQD Take 237 mLs by mouth daily. Patient not taking: Reported on 04/20/2022 01/02/22   Allie Bossier, MD  Multiple Vitamin (MULTIVITAMIN WITH MINERALS) TABS tablet Take 1 tablet by mouth daily. Patient not taking: Reported on 04/20/2022 05/25/21   Annita Brod, MD  Nutritional Supplements (,FEEDING SUPPLEMENT, PROSOURCE PLUS) liquid Take 30 mLs by mouth 2 (two) times daily between meals. Patient not taking: Reported on 04/19/2022 01/02/22   Allie Bossier, MD  pravastatin (PRAVACHOL) 20 MG tablet Take 3 tablets (60 mg total) by mouth daily. Patient not taking: Reported on 04/23/2022 01/03/22   Allie Bossier, MD  propranolol (INDERAL) 10 MG tablet Take 1 tablet (10 mg total) by mouth 2 (two) times daily. Patient not taking: Reported on 04/26/2022 06/03/21    Elgergawy, Silver Huguenin, MD     Critical care time: 35 minutes

## 2022-04-19 NOTE — Progress Notes (Signed)
PROGRESS NOTE    Peter Nichols  Peter Nichols:[id number] DOB: 1946/03/03 DOA: 04/30/2022 PCP: [name], MD   Brief Narrative: Peter Nichols is a 76 y.o. male alcoholic cirrhosis of the liver, recurrent ascites, portal hypertension, history of hepatic encephalopathy,  hepatic hydrothorax, large left retroperitoneal varix, chronic thrombocytopenia, he recently underwent ultrasound-guided thoracentesis  yielding 800 cc amber fluids on 6/28 prior to that he had paracentesis 6/23 yielding 4.5 L, his diuretics has been recently increased, he presents with generalized weakness since the morning of admission. He had another paracentesis 6/30 per family yielding 4 L.  His diuretics lasix and spironolactone were increase recently.  He has been so weak, he was not able to goes down stair.  Wife notice some confusion.  He only had one BM the day prior to admission.   Patient denies abdominal pain.    Of note he was evaluated at [facility], not candidate for transplant due to age and co morbidities, but was advised to follow up at [facility]. He was refer to [facility] for second opinion.    Evaluation in the ED: Sodium 133, potassium 3.7, BUN 23, creatinine 1.3, albumin 3.4, AST 49, ALT 42, ammonia and 119, total bilirubin 10.4, BNP 81, lactic acid 3.1---4.3.  Hemoglobin 11, platelets 85. Chest x-ray: Negative   Presented with lactic acidosis, became hypotensive overnight. He has received IV fluids and albumin. CCM consulted. Started on levophed. CCM recommended Therapeutic thoracentesis.    Assessment & Plan:   Principal Problem:   Lactic acidosis Active Problems:   Decompensated hepatic cirrhosis (HCC)   GERD (gastroesophageal reflux disease)   Generalized weakness  1-Generalized weakness;  Suspect related to hypovolemia, from recent paracentesis, thoracentesis, increase diuretics and decompensated cirrhosis -Plan to monitor for hypoglycemia.  -Received IV fluids.  -Hold diuretics.  -He will need PT evaluation.    2-Lactic acidosis// Hypotension. ; Could be related to hypovolemia, hypoperfusion. Also could be related to his liver cirrhosis.  He has received Albumin and IV fluids.  MAP 56. CCM consulted. Plan for more albumin. He was started on levophed.  CCM recommend to proceed with Diagnostic paracentesis. I placed order for IR to do diagnostic paracentesis.  Continue with IV Ceftriaxone.   3-Decompensated Cirrhosis of Liver;  Continue with  lactulose.  GI Consulted. Plan to hold diuretics. (At time of resumption of Diuretics plan to resume previous dose before they raise it ) Meld score 28. 19 % estimated 3 month mortality (7/01//2023.)   Continue with IV ceftriaxone.  Monitor for GI bleed.  Hold BB.  3 L removed during paracentesis. CCM will order Albumin.   4-Acute metabolic encephalopathy; he has mild confusion.  Resume lactulose, per GI resume prior home dose 30 gr TID>  Continue with  Rifaximin.    superficial vein thrombosis involving the left small sahenous vein.  Monitor. Would avoid NSAID>  Needs follow up doppler.    Urine retention;  He had I and O.  Monitor.   Hypokalemia; replete orally.   Estimated body mass index is 23.86 kg/m as calculated from the following:   Height as of this encounter: 5' 9"  (1.753 m).   Weight as of this encounter: 73.3 kg.   DVT prophylaxis: SCD Code Status: Full code Family Communication: Care discussed with patient and wife at bedside.  Disposition Plan:  Status is: Observation The patient remains OBS appropriate and will d/c before 2 midnights.    Consultants:  CCM GI  Procedures:  Paracentesis.   Antimicrobials:  Subjective: He is alert, feels uncomfortable. No particular complain or pain.    Objective: Vitals:   04/19/22 0300 04/19/22 0400 04/19/22 0600 04/19/22 0700  BP: (!) 127/57 (!) 125/47 (!) 125/49 (!) 113/49  Pulse: 86 87 80 81  Resp: 17 18 17 15   Temp:      TempSrc:      SpO2: 96% 99% 98% 97%   Weight:      Height:        Intake/Output Summary (Last 24 hours) at 04/19/2022 0835 Last data filed at 04/19/2022 0400 Gross per 24 hour  Intake 340.34 ml  Output 550 ml  Net -209.66 ml   Filed Weights   05/08/2022 1411 05/04/2022 2138  Weight: 71.2 kg 73.3 kg    Examination:  General exam: NAD, icteric   Respiratory system: CTA Cardiovascular system:  S 1, S 2 RRR Gastrointestinal system: BS present, soft, nt Central nervous system: alert, answering question.  Extremities: no edema   Data Reviewed: I have personally reviewed following labs and imaging studies  CBC: Recent Labs  Lab 04/28/2022 1410 04/19/22 0258  WBC 7.0 4.7  HGB 11.1* 10.0*  HCT 32.4* 28.6*  MCV 103.2* 101.1*  PLT 85* 75*   Basic Metabolic Panel: Recent Labs  Lab 05/01/2022 1422 05/17/2022 1755 04/19/22 0258  NA 133*  --  133*  K 3.7  --  3.1*  CL 98  --  100  CO2 20*  --  23  GLUCOSE 115*  --  134*  BUN 23  --  23  CREATININE 1.32*  --  1.21  CALCIUM 9.8  --  9.4  MG  --  2.0  --    GFR: Estimated Creatinine Clearance: 52.7 mL/min (by C-G formula based on SCr of 1.21 mg/dL). Liver Function Tests: Recent Labs  Lab 04/29/2022 1422 04/19/22 0258  AST 49* 40  ALT 42 38  ALKPHOS 56 52  BILITOT 10.4* 7.5*  PROT 5.5* 5.1*  ALBUMIN 3.4* 3.1*   No results for input(s): "LIPASE", "AMYLASE" in the last 168 hours. Recent Labs  Lab 05/08/2022 1419  AMMONIA 119*   Coagulation Profile: Recent Labs  Lab 04/23/2022 1755 04/19/22 0258  INR 2.1* 2.1*   Cardiac Enzymes: No results for input(s): "CKTOTAL", "CKMB", "CKMBINDEX", "TROPONINI" in the last 168 hours. BNP (last 3 results) No results for input(s): "PROBNP" in the last 8760 hours. HbA1C: No results for input(s): "HGBA1C" in the last 72 hours. CBG: Recent Labs  Lab 05/03/2022 1609 04/23/2022 2036 05/14/2022 2331 04/19/22 0401 04/19/22 0744  GLUCAP 125* 193* 140* 127* 122*   Lipid Profile: No results for input(s): "CHOL", "HDL",  "LDLCALC", "TRIG", "CHOLHDL", "LDLDIRECT" in the last 72 hours. Thyroid Function Tests: No results for input(s): "TSH", "T4TOTAL", "FREET4", "T3FREE", "THYROIDAB" in the last 72 hours. Anemia Panel: No results for input(s): "VITAMINB12", "FOLATE", "FERRITIN", "TIBC", "IRON", "RETICCTPCT" in the last 72 hours. Sepsis Labs: Recent Labs  Lab 05/10/2022 1418 04/27/2022 1603 05/18/2022 1930 05/02/2022 2229  LATICACIDVEN 3.1* 4.3* 4.3* 4.0*    Recent Results (from the past 240 hour(s))  MRSA Next Gen by PCR, Nasal     Status: None   Collection Time: 04/23/2022  9:56 PM   Specimen: Nasal Mucosa; Nasal Swab  Result Value Ref Range Status   MRSA by PCR Next Gen NOT DETECTED NOT DETECTED Final    Comment: (NOTE) The GeneXpert MRSA Assay (FDA approved for NASAL specimens only), is one component of a comprehensive MRSA colonization surveillance program. It is  not intended to diagnose MRSA infection nor to guide or monitor treatment for MRSA infections. Test performance is not FDA approved in patients less than 98 years old. Performed at [facility], [place] [address], [place], [place] [address]          Radiology Studies: DG CHEST PORT 1 VIEW  Result Date: 04/19/2022 CLINICAL DATA:  76 year old male with history of hypotension. Lactic acidosis. EXAM: PORTABLE CHEST 1 VIEW COMPARISON:  Chest x-ray 04/23/2022. FINDINGS: Skin fold artifact projecting over the lower left hemithorax. Lung volumes are low. No consolidative airspace disease. No pleural effusions. No pneumothorax. No pulmonary nodule or mass noted. Pulmonary vasculature and the cardiomediastinal silhouette are within normal limits. IMPRESSION: 1. Low lung volumes without radiographic evidence of acute cardiopulmonary disease. Electronically Signed   By: Vinnie Langton M.D.   On: 04/19/2022 07:28   DG Chest 2 View  Result Date: 05/06/2022 CLINICAL DATA:  Generalized weakness since this morning, cirrhosis post  paracentesis yesterday, diuretics increased yesterday, patient reports up all night urinating. History hypertension, NASH, GERD EXAM: CHEST - 2 VIEW COMPARISON:  04/15/2022 FINDINGS: Normal heart size, mediastinal contours, and pulmonary vascularity. Lungs clear. No pulmonary infiltrate, pleural effusion, or pneumothorax. Scattered endplate spur formation thoracic spine. IMPRESSION: No acute abnormalities. Electronically Signed   By: Lavonia Dana M.D.   On: 05/06/2022 15:13   VAS [place] LOWER EXTREMITY VENOUS (DVT) (7a-7p)  Result Date: 05/05/2022  Lower Venous DVT Study Patient Name:  MEDARDO HASSING  Date of Exam:   05/02/2022 Medical Rec #: [id number]        Accession #:    [id number] Date of Birth: September 21, 1946         Patient Gender: M Patient Age:   42 years Exam Location:  [facility] Procedure:      VAS [place] LOWER EXTREMITY VENOUS (DVT) Referring Phys: Ova Freshwater FONDAW --------------------------------------------------------------------------------  Indications: Pain.  Risk Factors: None identified. Limitations: Poor ultrasound/tissue interface. Comparison Study: No prior studies. Performing Technologist: Oliver Hum RVT  Examination Guidelines: A complete evaluation includes B-mode imaging, spectral Doppler, color Doppler, and power Doppler as needed of all accessible portions of each vessel. Bilateral testing is considered an integral part of a complete examination. Limited examinations for reoccurring indications may be performed as noted. The reflux portion of the exam is performed with the patient in reverse Trendelenburg.  +-----+---------------+---------+-----------+----------+--------------+ RIGHTCompressibilityPhasicitySpontaneityPropertiesThrombus Aging +-----+---------------+---------+-----------+----------+--------------+ CFV  Full           Yes      Yes                                 +-----+---------------+---------+-----------+----------+--------------+    +---------+---------------+---------+-----------+----------+-----------------+ LEFT     CompressibilityPhasicitySpontaneityPropertiesThrombus Aging    +---------+---------------+---------+-----------+----------+-----------------+ CFV      Full           Yes      Yes                                    +---------+---------------+---------+-----------+----------+-----------------+ SFJ      Full                                                           +---------+---------------+---------+-----------+----------+-----------------+  FV Prox  Full                                                           +---------+---------------+---------+-----------+----------+-----------------+ FV Mid   Full                                                           +---------+---------------+---------+-----------+----------+-----------------+ FV DistalFull                                                           +---------+---------------+---------+-----------+----------+-----------------+ PFV      Full                                                           +---------+---------------+---------+-----------+----------+-----------------+ POP      Full           Yes      Yes                                    +---------+---------------+---------+-----------+----------+-----------------+ PTV      Full                                                           +---------+---------------+---------+-----------+----------+-----------------+ PERO     Full                                                           +---------+---------------+---------+-----------+----------+-----------------+ SSV      None                                         Age Indeterminate +---------+---------------+---------+-----------+----------+-----------------+ The thrombus noted in the short saphenous vein extends from the proximal calf into the confluence with the popliteal vein. The  thrombus does not appear to extend into the popliteal vein.   Summary: RIGHT: - No evidence of common femoral vein obstruction.  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 - There is no evidence of deep vein thrombosis in the lower extremity.  - No cystic structure found in the popliteal fossa. - The thrombus noted in the short saphenous vein extends from the proximal calf into the confluence with the popliteal vein.  The thrombus does not appear to extend into the popliteal vein.  *See table(s) above for measurements and observations.    Preliminary    IR Paracentesis  Result Date: 04/17/2022 INDICATION: Patient with history of cirrhosis with recurrent ascites. TIPS procedure on 5.30.23 aborted due to elevated right heart pressures. EXAM: ULTRASOUND GUIDED THERAPEUTIC PARACENTESIS MEDICATIONS: Lidocaine 1% 10 mL COMPLICATIONS: None immediate. PROCEDURE: Informed written consent was obtained from the patient after a discussion of the risks, benefits and alternatives to treatment. A timeout was performed prior to the initiation of the procedure. Initial ultrasound scanning demonstrates a moderate amount of ascites within the right lower abdominal quadrant. The right lower abdomen was prepped and draped in the usual sterile fashion. 1% lidocaine was used for local anesthesia. Following this, a 19 gauge, 7-cm, Yueh catheter was introduced. An ultrasound image was saved for documentation purposes. The paracentesis was performed. The catheter was removed and a dressing was applied. The patient tolerated the procedure well without immediate post procedural complication. Patient received post-procedure intravenous albumin; see nursing notes for details. FINDINGS: A total of approximately 4.5 of straw-colored fluid was removed. IMPRESSION: Successful ultrasound-guided therapeutic paracentesis yielding 4.5 liters of peritoneal fluid. Read by:  Rushie Nyhan, NP Electronically Signed   By: Miachel Roux M.D.   On: 04/17/2022 16:16        Scheduled Meds:  Chlorhexidine Gluconate Cloth  6 each Topical Daily   lactulose  45 g Oral TID   pantoprazole (PROTONIX) IV  40 mg Intravenous Q12H   potassium chloride  40 mEq Oral Once   rifaximin  550 mg Oral BID   Continuous Infusions:  cefTRIAXone (ROCEPHIN)  IV Stopped (05/09/2022 1854)   sodium chloride       LOS: 0 days    Time spent: 35 minutes.     Elmarie Shiley, MD [facility]   If 7PM-7AM, please contact night-coverage [email or web address]  04/19/2022, 8:35 AM

## 2022-04-19 NOTE — Progress Notes (Signed)
An USGPIV (ultrasound guided PIV) has been placed for short-term vasopressor infusion. A correctly placed ivWatch must be used when administering Vasopressors. Should this treatment be needed beyond 72 hours, central line access should be obtained.  It will be the responsibility of the bedside nurse to follow best practice to prevent extravasations.   ?

## 2022-04-19 NOTE — Progress Notes (Signed)
  Echocardiogram 2D Echocardiogram has been performed.  Peter Nichols 04/19/2022, 10:51 AM

## 2022-04-19 NOTE — Progress Notes (Addendum)
HOSPITAL MEDICINE OVERNIGHT EVENT NOTE    Patient has been exhibiting hypotension throughout the evening shift.  Patient received a 1 L bolus of isotonic fluids in the emergency department.  Upon arrival to the medical floor patient additionally received a dose of intravenous albumin ordered by Dr. Nevada Crane.  Nursing has notified me that patient continues to exhibit hypotension with maps below 65.  Considering patient is at high risk of volume overload in the history of liver disease we will give a 250 cc bolus and reassess.  Furthermore, patient has not urinated in several hours.  A bladder scan has been performed revealing 671 cc.  Patient is unable to urinate and therefore will will perform intermittent catheterization now.  Peter Emerald  MD [facility]   ADDENDUM (7/2 7am)  BP improved to 125/49 S/P bolus.  Continuing to montior closely.    Peter Nichols

## 2022-04-19 NOTE — Procedures (Signed)
PROCEDURE SUMMARY:  Successful ultrasound guided diagnostic paracentesis from the RLQ. Yielded 3 L of clear, golden-colored fluid.  No immediate complications.  The patient tolerated the procedure well.   Specimen was sent for labs.  EBL < 1 mL  The patient has previously been evaluated by the [facility], and deemed not a candidate for intervention.      Narda Rutherford, AGNP-BC 04/19/2022, 1:13 PM

## 2022-04-20 ENCOUNTER — Inpatient Hospital Stay (HOSPITAL_COMMUNITY): Payer: Medicare HMO

## 2022-04-20 ENCOUNTER — Inpatient Hospital Stay (HOSPITAL_COMMUNITY)
Admission: RE | Admit: 2022-04-20 | Discharge: 2022-04-20 | Disposition: A | Discharge status: Home / Self Care | Payer: Medicare HMO | Source: Ambulatory Visit | Attending: Gastroenterology | Admitting: Gastroenterology

## 2022-04-20 DIAGNOSIS — E872 Acidosis, unspecified: Secondary | ICD-10-CM | POA: Diagnosis not present

## 2022-04-20 LAB — URINE CULTURE: Culture: NO GROWTH

## 2022-04-20 LAB — CBC WITH DIFFERENTIAL/PLATELET
Abs Immature Granulocytes: 0.03 10*3/uL (ref 0.00–0.07)
Basophils Absolute: 0 10*3/uL (ref 0.0–0.1)
Basophils Relative: 1 %
Eosinophils Absolute: 0.4 10*3/uL (ref 0.0–0.5)
Eosinophils Relative: 7 %
HCT: 27.5 % — ABNORMAL LOW (ref 39.0–52.0)
Hemoglobin: 9.5 g/dL — ABNORMAL LOW (ref 13.0–17.0)
Immature Granulocytes: 1 %
Lymphocytes Relative: 5 %
Lymphs Abs: 0.3 10*3/uL — ABNORMAL LOW (ref 0.7–4.0)
MCH: 35.1 pg — ABNORMAL HIGH (ref 26.0–34.0)
MCHC: 34.5 g/dL (ref 30.0–36.0)
MCV: 101.5 fL — ABNORMAL HIGH (ref 80.0–100.0)
Monocytes Absolute: 0.5 10*3/uL (ref 0.1–1.0)
Monocytes Relative: 9 %
Neutro Abs: 4.6 10*3/uL (ref 1.7–7.7)
Neutrophils Relative %: 77 %
Platelets: 67 10*3/uL — ABNORMAL LOW (ref 150–400)
RBC: 2.71 MIL/uL — ABNORMAL LOW (ref 4.22–5.81)
RDW: 15.4 % (ref 11.5–15.5)
WBC: 5.8 10*3/uL (ref 4.0–10.5)
nRBC: 0 % (ref 0.0–0.2)

## 2022-04-20 LAB — GLUCOSE, CAPILLARY
Glucose-Capillary: 104 mg/dL — ABNORMAL HIGH (ref 70–99)
Glucose-Capillary: 110 mg/dL — ABNORMAL HIGH (ref 70–99)
Glucose-Capillary: 128 mg/dL — ABNORMAL HIGH (ref 70–99)
Glucose-Capillary: 133 mg/dL — ABNORMAL HIGH (ref 70–99)
Glucose-Capillary: 167 mg/dL — ABNORMAL HIGH (ref 70–99)
Glucose-Capillary: 187 mg/dL — ABNORMAL HIGH (ref 70–99)

## 2022-04-20 LAB — COMPREHENSIVE METABOLIC PANEL
ALT: 33 U/L (ref 0–44)
AST: 33 U/L (ref 15–41)
Albumin: 3.5 g/dL (ref 3.5–5.0)
Alkaline Phosphatase: 41 U/L (ref 38–126)
Anion gap: 8 (ref 5–15)
BUN: 18 mg/dL (ref 8–23)
CO2: 23 mmol/L (ref 22–32)
Calcium: 9.2 mg/dL (ref 8.9–10.3)
Chloride: 103 mmol/L (ref 98–111)
Creatinine, Ser: 1.13 mg/dL (ref 0.61–1.24)
GFR, Estimated: >60 mL/min (ref >60)
Glucose, Bld: 112 mg/dL — ABNORMAL HIGH (ref 70–99)
Potassium: 3.4 mmol/L — ABNORMAL LOW (ref 3.5–5.1)
Sodium: 134 mmol/L — ABNORMAL LOW (ref 135–145)
Total Bilirubin: 5.8 mg/dL — ABNORMAL HIGH (ref 0.3–1.2)
Total Protein: 5.1 g/dL — ABNORMAL LOW (ref 6.5–8.1)

## 2022-04-20 LAB — GRAM STAIN: Gram Stain: NONE SEEN

## 2022-04-20 LAB — OCCULT BLOOD, POC DEVICE: Fecal Occult Bld: NEGATIVE

## 2022-04-20 LAB — PROTIME-INR
INR: 2.5 — ABNORMAL HIGH (ref 0.8–1.2)
Prothrombin Time: 26.6 seconds — ABNORMAL HIGH (ref 11.4–15.2)

## 2022-04-20 LAB — PATHOLOGIST SMEAR REVIEW

## 2022-04-20 LAB — PROCALCITONIN: Procalcitonin: 0.18 ng/mL

## 2022-04-20 MED ORDER — ALBUMIN HUMAN 25 % IV SOLN
25.0000 g | Freq: Once | INTRAVENOUS | Status: AC
Start: 2022-04-20 — End: 2022-04-20
  Administered 2022-04-20: 25 g via INTRAVENOUS
  Filled 2022-04-20: qty 100

## 2022-04-20 MED ORDER — IOHEXOL 350 MG/ML SOLN
100.0000 mL | Freq: Once | INTRAVENOUS | Status: AC | PRN
  Administered 2022-04-20: 100 mL via INTRAVENOUS

## 2022-04-20 MED ORDER — POTASSIUM CHLORIDE CRYS ER 20 MEQ PO TBCR
40.0000 meq | EXTENDED_RELEASE_TABLET | Freq: Once | ORAL | Status: AC
  Administered 2022-04-20: 40 meq via ORAL
  Filled 2022-04-20: qty 2

## 2022-04-20 NOTE — Consult Note (Signed)
Chief Complaint: Patient was seen in consultation today for possible TIPS  Referring Physician(s): Ronnette Juniper, MD  Supervising Physician: Ruthann Cancer  Patient Status: [facility] - In-pt  History of Present Illness: Peter Nichols is a 76 y.o. male with a past medical history significant for HTN, chronic thrombocytopenia, GERD, decompensated alcoholic cirrhosis, portal hypertension, hepatic encephalopathy, hepatic hydrothorax, recurrent ascites, large left retroperitoneal varix who presented to [facility] ED on 7/1 with complaints of weakness. On initial evaluation he was noted to be mildly confused with Na+ 133, K+ 3.7, creatinine 1.3, ammonia 119, t.bili 10.4, hgb 11, plt 85. He had reported a recent increase in his diuretics as well as 4.0 L paracentesis on 6/30 so he was felt to likely be weak due to volume depletion. He was admitted for IVF and further evaluation. He experienced persistent hypotension with diuretic use and required initiation of levophed.  GI was consulted for assistance with management of decompensated cirrhosis during this admission and due to his inability to tolerate diuretics IR has been consulted for TIPS placement.   Patient is well known to IR service due to multiple paracentesis (last 7/1) and thoracentesis (last 6/28) as well as attempted TIPS placement 03/17/22 however this was aborted due to elevated right heart pressure (mean 20 mmHg).   Patient seen at bedside with Dr. Serafina Royals, daughter Otila Kluver present at bedside with other daughter Sherron Flemings on speaker phone. Reviewed recent attempted TIPS and subsequent stopping of the procedure due to elevated right heart pressure at that time as well as improved right heart pressures on echo done yesterday. Reviewed ectopic varix and how it is likely shunting blood away from the liver and contributing to recurrent ascites/hepatic hydrothorax and hyperammonemia despite compliance with lactulose. Also discussed that if TIPS was re-attempted  would likely need to embolize the varices to stop this shunting. Mr. Sane daughter notes that they were seen recently by [facility] transplant team and were told that his MELD would need to be >30 to consider transplant and that he likely would not be able to live long enough for a deceased donor, so they are now considering a living donor. They are very interested in discussing TIPS and how it would affect possible transplant opportunities before proceeding again.    Past Medical History:  Diagnosis Date   Cirrhosis ([place])    Dysphagia    no current problems   GERD (gastroesophageal reflux disease)    Hyperlipidemia    Hypertension    Neuromuscular disorder (HCC)    tremor   Steatohepatitis, non-alcoholic    Thrombocytopenia (HCC)     Past Surgical History:  Procedure Laterality Date   CHOLECYSTECTOMY     ESOPHAGOGASTRODUODENOSCOPY (EGD) WITH PROPOFOL Bilateral 08/06/2021   Procedure: ESOPHAGOGASTRODUODENOSCOPY (EGD) WITH PROPOFOL;  Surgeon: Arta Silence, MD;  Location: [facility]OS[facility];  Service: Endoscopy;  Laterality: Bilateral;   INGUINAL HERNIA REPAIR Right 12/23/2021   Procedure: RIGHT INGUINAL HERNIA REPAIR- OPEN;  Surgeon: Johnathan Hausen, MD;  Location: [facility] ORS;  Service: General;  Laterality: Right;   IR PARACENTESIS  06/16/2021   IR PARACENTESIS  07/11/2021   IR PARACENTESIS  01/29/2022   IR PARACENTESIS  02/12/2022   IR PARACENTESIS  03/17/2022   IR PARACENTESIS  04/02/2022   IR PARACENTESIS  04/10/2022   IR PARACENTESIS  04/17/2022   IR RADIOLOGIST EVAL & MGMT  03/04/2022   IR THORACENTESIS ASP PLEURAL SPACE W/IMG GUIDE  03/17/2022   IR THORACENTESIS ASP PLEURAL SPACE W/IMG GUIDE  03/30/2022   IR  THORACENTESIS ASP PLEURAL SPACE W/IMG GUIDE  04/06/2022   IR THORACENTESIS ASP PLEURAL SPACE W/IMG GUIDE  04/15/2022   IR TIPS  03/17/2022   IR US GUIDE VASC ACCESS RIGHT  03/17/2022   RADIOLOGY WITH ANESTHESIA N/A 03/17/2022   Procedure: TIPS;  Surgeon: Mir, Paula Libra, MD;  Location: [place];   Service: Radiology;  Laterality: N/A;   STOMACH SURGERY      Allergies: Whey and Ensure  Medications: Prior to Admission medications   Medication Sig Start Date End Date Taking? Authorizing Provider  b complex vitamins capsule Take 1 capsule by mouth daily.   Yes [provider]  calcium carbonate (TUMS - DOSED IN MG ELEMENTAL CALCIUM) 500 MG chewable tablet Chew 1 tablet by mouth daily as needed for indigestion or heartburn.   Yes [provider]  fluticasone (FLONASE) 50 MCG/ACT nasal spray Place 1-2 sprays into both nostrils daily as needed for allergies or rhinitis.   Yes [provider]  furosemide (LASIX) 40 MG tablet Take 40 mg by mouth daily. 01/09/22  Yes [provider]  lactulose (CHRONULAC) 10 GM/15ML solution Take 45 mLs (30 g total) by mouth 3 (three) times daily. Patient taking differently: Take 30 g by mouth 2 (two) times daily. 01/02/22  Yes Allie Bossier, MD  Multiple Vitamins-Minerals (MULTIVITAMIN MEN 50+) TABS Take 1 tablet by mouth daily with breakfast.   Yes [provider]  pantoprazole (PROTONIX) 40 MG tablet Take 40 mg by mouth daily before breakfast.   Yes [provider]  pravastatin (PRAVACHOL) 40 MG tablet Take 40 mg by mouth daily.   Yes [provider]  Protein POWD Take 1 Scoop by mouth See admin instructions. Unnamed WHEY-FREE protein powder: Mix 1 scoopful of powder into the appropriate amount of liquid and drink once a day as needed for nutritional support   Yes [provider]  rifaximin (XIFAXAN) 550 MG TABS tablet Take 1 tablet (550 mg total) by mouth 2 (two) times daily. 01/02/22  Yes Allie Bossier, MD  spironolactone (ALDACTONE) 50 MG tablet Take 150 mg by mouth daily. 01/09/22  Yes [provider]  feeding supplement (ENSURE ENLIVE / ENSURE PLUS) LIQD Take 237 mLs by mouth daily. Patient not taking: Reported on 04/21/2022 01/02/22   Allie Bossier, MD  Multiple Vitamin  (MULTIVITAMIN WITH MINERALS) TABS tablet Take 1 tablet by mouth daily. Patient not taking: Reported on 04/27/2022 05/25/21   Annita Brod, MD  Nutritional Supplements (,FEEDING SUPPLEMENT, PROSOURCE PLUS) liquid Take 30 mLs by mouth 2 (two) times daily between meals. Patient not taking: Reported on 05/16/2022 01/02/22   Allie Bossier, MD  pravastatin (PRAVACHOL) 20 MG tablet Take 3 tablets (60 mg total) by mouth daily. Patient not taking: Reported on 04/23/2022 01/03/22   Allie Bossier, MD  propranolol (INDERAL) 10 MG tablet Take 1 tablet (10 mg total) by mouth 2 (two) times daily. Patient not taking: Reported on 05/10/2022 06/03/21   Elgergawy, Silver Huguenin, MD     Family History  Problem Relation Age of Onset   Cancer Mother     Social History   Socioeconomic History   Marital status: Married    Spouse name: Not on file   Number of children: Not on file   Years of education: Not on file   Highest education level: Not on file  Occupational History   Not on file  Tobacco Use   Smoking status: Never   Smokeless tobacco: Never  Vaping Use   Vaping Use: Never used  Substance and Sexual Activity   Alcohol use: Not Currently    Comment: none in 8 months on preop visit of 12/10/21   Drug use: Never   Sexual activity: Not Currently  Other Topics Concern   Not on file  Social History Narrative   Not on file   Social Determinants of Health   Financial Resource Strain: Not on file  Food Insecurity: Not on file  Transportation Needs: Not on file  Physical Activity: Not on file  Stress: Not on file  Social Connections: Not on file     Review of Systems: A 12 point ROS discussed and pertinent positives are indicated in the HPI above.  All other systems are negative.  Review of Systems  Constitutional:  Positive for fatigue. Negative for fever.  Respiratory:  Negative for shortness of breath.   Gastrointestinal:  Positive for abdominal distention. Negative for diarrhea, nausea and  vomiting.    Vital Signs: BP (!) 123/39   Pulse 89   Temp 97.9 F (36.6 C) (Oral)   Resp (!) 23   Ht 5' 9"  (1.753 m)   Wt 161 lb 9.6 oz (73.3 kg)   SpO2 99%   BMI 23.86 kg/m   Physical Exam Vitals and nursing note reviewed.  Constitutional:      General: He is not in acute distress.    Appearance: He is ill-appearing.  HENT:     Head: Normocephalic.  Eyes:     General: Scleral icterus present.  Cardiovascular:     Rate and Rhythm: Normal rate.  Pulmonary:     Effort: Pulmonary effort is normal.  Skin:    General: Skin is warm and dry.     Coloration: Skin is not jaundiced.  Neurological:     Mental Status: He is alert.      Imaging: US Paracentesis  Result Date: 04/19/2022 INDICATION: History of cirrhosis with recurrent ascites. Patient was scheduled for elective tips on 03/17/2022. The procedure was aborted due to elevated right heart pressure right heart pressure remained elevated even after right thoracentesis and paracentesis was performed, procedure was aborted. Request for diagnostic paracentesis. EXAM: ULTRASOUND GUIDED DIAGNOSTIC  RIGHT LOWER QUADRANT PARACENTESIS MEDICATIONS: 10 mL 1 % lidocaine COMPLICATIONS: None immediate. PROCEDURE: Informed written consent was obtained from the patient after a discussion of the risks, benefits and alternatives to treatment. A timeout was performed prior to the initiation of the procedure. Initial ultrasound scanning demonstrates a large amount of ascites within the right lower abdominal quadrant. The right lower abdomen was prepped and draped in the usual sterile fashion. 1% lidocaine was used for local anesthesia. Following this, a 19 gauge, 7-cm, Yueh catheter was introduced. An ultrasound image was saved for documentation purposes. The paracentesis was performed. The catheter was removed and a dressing was applied. The patient tolerated the procedure well without immediate post procedural complication. Patient received  post-procedure intravenous albumin; see nursing notes for details. FINDINGS: A total of approximately 3 L of clear, golden colored fluid was removed. Samples were sent to the laboratory as requested by the clinical team. IMPRESSION: Successful ultrasound-guided paracentesis yielding 3 liters of peritoneal fluid. Read by: Narda Rutherford, AGNP-BC PLAN: The patient has previously been evaluated by the [facility], and deemed not a candidate for intervention. Electronically Signed   By: Jacqulynn Cadet M.D.   On: 04/19/2022 13:28   VAS [place] LOWER EXTREMITY VENOUS (DVT) (7a-7p)  Result  Date: 04/19/2022  Lower Venous DVT Study Patient Name:  TOU HAYNER  Date of Exam:   05/12/2022 Medical Rec #: [id number]        Accession #:    [id number] Date of Birth: 11/15/1945         Patient Gender: M Patient Age:   63 years Exam Location:  [facility] Procedure:      VAS [place] LOWER EXTREMITY VENOUS (DVT) Referring Phys: Ova Freshwater FONDAW --------------------------------------------------------------------------------  Indications: Pain.  Risk Factors: None identified. Limitations: Poor ultrasound/tissue interface. Comparison Study: No prior studies. Performing Technologist: Oliver Hum RVT  Examination Guidelines: A complete evaluation includes B-mode imaging, spectral Doppler, color Doppler, and power Doppler as needed of all accessible portions of each vessel. Bilateral testing is considered an integral part of a complete examination. Limited examinations for reoccurring indications may be performed as noted. The reflux portion of the exam is performed with the patient in reverse Trendelenburg.  +-----+---------------+---------+-----------+----------+--------------+ RIGHTCompressibilityPhasicitySpontaneityPropertiesThrombus Aging +-----+---------------+---------+-----------+----------+--------------+ CFV  Full           Yes      Yes                                  +-----+---------------+---------+-----------+----------+--------------+   +---------+---------------+---------+-----------+----------+-----------------+ LEFT     CompressibilityPhasicitySpontaneityPropertiesThrombus Aging    +---------+---------------+---------+-----------+----------+-----------------+ CFV      Full           Yes      Yes                                    +---------+---------------+---------+-----------+----------+-----------------+ SFJ      Full                                                           +---------+---------------+---------+-----------+----------+-----------------+ FV Prox  Full                                                           +---------+---------------+---------+-----------+----------+-----------------+ FV Mid   Full                                                           +---------+---------------+---------+-----------+----------+-----------------+ FV DistalFull                                                           +---------+---------------+---------+-----------+----------+-----------------+ PFV      Full                                                           +---------+---------------+---------+-----------+----------+-----------------+  POP      Full           Yes      Yes                                    +---------+---------------+---------+-----------+----------+-----------------+ PTV      Full                                                           +---------+---------------+---------+-----------+----------+-----------------+ PERO     Full                                                           +---------+---------------+---------+-----------+----------+-----------------+ SSV      None                                         Age Indeterminate +---------+---------------+---------+-----------+----------+-----------------+ The thrombus noted in the short saphenous vein extends  from the proximal calf into the confluence with the popliteal vein. The thrombus does not appear to extend into the popliteal vein.    Summary: RIGHT: - No evidence of common femoral vein obstruction.  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 - There is no evidence of deep vein thrombosis in the lower extremity.  - No cystic structure found in the popliteal fossa. - The thrombus noted in the short saphenous vein extends from the proximal calf into the confluence with the popliteal vein. The thrombus does not appear to extend into the popliteal vein.  *See table(s) above for measurements and observations. Electronically signed by Jamelle Haring on 04/19/2022 at 12:21:05 PM.    Final    ECHOCARDIOGRAM COMPLETE  Result Date: 04/19/2022    ECHOCARDIOGRAM REPORT   Patient Name:   MAKI SWEETSER Date of Exam: 04/19/2022 Medical Rec #:  [id number]       Height:       69.0 in Accession #:    [id number]      Weight:       161.6 lb Date of Birth:  [birth year]        BSA:          1.887 m Patient Age:    3 years        BP:           135/54 mmHg Patient Gender: M               HR:           89 bpm. Exam Location:  Inpatient Procedure: 2D Echo, Cardiac Doppler and Color Doppler Indications:    R06.02 SOB  History:        Patient has prior history of Echocardiogram examinations, most                 recent 03/06/2022. Risk Factors:Hypertension and Dyslipidemia.  ETOH.  Sonographer:    Roseanna Rainbow RDCS Referring Phys: [id number] Hortencia Conradi MEIER  Sonographer Comments: Technically difficult study due to poor echo windows. Image acquisition challenging due to respiratory motion. Diffucult windows, patient could not raise left arm adaquately. Attempted to turn IMPRESSIONS  1. Left ventricular ejection fraction, by estimation, is 60 to 65%. The left ventricle has normal function. The left ventricle has no regional wall motion abnormalities. Left  ventricular diastolic parameters were normal.  2. Right ventricular systolic function is normal. The right ventricular size is normal.  3. Left atrial size was mildly dilated.  4. The mitral valve is normal in structure. No evidence of mitral valve regurgitation. No evidence of mitral stenosis.  5. The aortic valve was not well visualized. There is mild calcification of the aortic valve. There is mild thickening of the aortic valve. Aortic valve regurgitation is not visualized. Aortic valve sclerosis is present, with no evidence of aortic valve  stenosis.  6. The inferior vena cava is dilated in size with >50% respiratory variability, suggesting right atrial pressure of 8 mmHg. FINDINGS  Left Ventricle: Left ventricular ejection fraction, by estimation, is 60 to 65%. The left ventricle has normal function. The left ventricle has no regional wall motion abnormalities. The left ventricular internal cavity size was normal in size. There is  no left ventricular hypertrophy. Left ventricular diastolic parameters were normal. Right Ventricle: The right ventricular size is normal. No increase in right ventricular wall thickness. Right ventricular systolic function is normal. Left Atrium: Left atrial size was mildly dilated. Right Atrium: Right atrial size was normal in size. Pericardium: There is no evidence of pericardial effusion. Mitral Valve: The mitral valve is normal in structure. No evidence of mitral valve regurgitation. No evidence of mitral valve stenosis. Tricuspid Valve: The tricuspid valve is normal in structure. Tricuspid valve regurgitation is mild . No evidence of tricuspid stenosis. Aortic Valve: The aortic valve was not well visualized. There is mild calcification of the aortic valve. There is mild thickening of the aortic valve. Aortic valve regurgitation is not visualized. Aortic valve sclerosis is present, with no evidence of aortic valve stenosis. Pulmonic Valve: The pulmonic valve was normal in  structure. Pulmonic valve regurgitation is not visualized. No evidence of pulmonic stenosis. Aorta: The aortic root is normal in size and structure. Venous: The inferior vena cava is dilated in size with greater than 50% respiratory variability, suggesting right atrial pressure of 8 mmHg. IAS/Shunts: No atrial level shunt detected by color flow Doppler.  LEFT VENTRICLE PLAX 2D LVIDd:         4.70 cm     Diastology LVIDs:         2.70 cm     LV e' medial:    9.90 cm/s LV PW:         1.00 cm     LV E/e' medial:  7.6 LV IVS:        0.90 cm     LV e' lateral:   7.94 cm/s LVOT diam:     2.40 cm     LV E/e' lateral: 9.5 LV SV:         106 LV SV Index:   56 LVOT Area:     4.52 cm  LV Volumes (MOD) LV vol d, MOD A2C: 64.0 ml LV vol d, MOD A4C: 92.1 ml LV vol s, MOD A2C: 15.6 ml LV vol s, MOD A4C: 20.3 ml LV SV MOD A2C:  48.4 ml LV SV MOD A4C:     92.1 ml LV SV MOD BP:      58.9 ml RIGHT VENTRICLE             IVC RV S prime:     28.90 cm/s  IVC diam: 2.30 cm TAPSE (M-mode): 2.7 cm LEFT ATRIUM             Index        RIGHT ATRIUM           Index LA diam:        4.10 cm 2.17 cm/m   RA Area:     11.80 cm LA Vol (A2C):   43.0 ml 22.79 ml/m  RA Volume:   23.10 ml  12.24 ml/m LA Vol (A4C):   39.8 ml 21.09 ml/m LA Biplane Vol: 40.9 ml 21.67 ml/m  AORTIC VALVE LVOT Vmax:   117.00 cm/s LVOT Vmean:  77.700 cm/s LVOT VTI:    0.234 m  AORTA Ao Root diam: 3.70 cm Ao Asc diam:  3.20 cm MITRAL VALVE MV Area (PHT): 3.88 cm    SHUNTS MV Decel Time: 196 msec    Systemic VTI:  0.23 m MV E velocity: 75.50 cm/s  Systemic Diam: 2.40 cm MV A velocity: 95.05 cm/s MV E/A ratio:  0.79 Jenkins Rouge MD Electronically signed by Jenkins Rouge MD Signature Date/Time: 04/19/2022/11:36:58 AM    Final    DG CHEST PORT 1 VIEW  Result Date: 04/19/2022 CLINICAL DATA:  76 year old male with history of hypotension. Lactic acidosis. EXAM: PORTABLE CHEST 1 VIEW COMPARISON:  Chest x-ray 04/30/2022. FINDINGS: Skin fold artifact projecting over the lower  left hemithorax. Lung volumes are low. No consolidative airspace disease. No pleural effusions. No pneumothorax. No pulmonary nodule or mass noted. Pulmonary vasculature and the cardiomediastinal silhouette are within normal limits. IMPRESSION: 1. Low lung volumes without radiographic evidence of acute cardiopulmonary disease. Electronically Signed   By: Vinnie Langton M.D.   On: 04/19/2022 07:28   DG Chest 2 View  Result Date: 05/16/2022 CLINICAL DATA:  Generalized weakness since this morning, cirrhosis post paracentesis yesterday, diuretics increased yesterday, patient reports up all night urinating. History hypertension, NASH, GERD EXAM: CHEST - 2 VIEW COMPARISON:  04/15/2022 FINDINGS: Normal heart size, mediastinal contours, and pulmonary vascularity. Lungs clear. No pulmonary infiltrate, pleural effusion, or pneumothorax. Scattered endplate spur formation thoracic spine. IMPRESSION: No acute abnormalities. Electronically Signed   By: Lavonia Dana M.D.   On: 04/29/2022 15:13   IR Paracentesis  Result Date: 04/17/2022 INDICATION: Patient with history of cirrhosis with recurrent ascites. TIPS procedure on 5.30.23 aborted due to elevated right heart pressures. EXAM: ULTRASOUND GUIDED THERAPEUTIC PARACENTESIS MEDICATIONS: Lidocaine 1% 10 mL COMPLICATIONS: None immediate. PROCEDURE: Informed written consent was obtained from the patient after a discussion of the risks, benefits and alternatives to treatment. A timeout was performed prior to the initiation of the procedure. Initial ultrasound scanning demonstrates a moderate amount of ascites within the right lower abdominal quadrant. The right lower abdomen was prepped and draped in the usual sterile fashion. 1% lidocaine was used for local anesthesia. Following this, a 19 gauge, 7-cm, Yueh catheter was introduced. An ultrasound image was saved for documentation purposes. The paracentesis was performed. The catheter was removed and a dressing was applied. The  patient tolerated the procedure well without immediate post procedural complication. Patient received post-procedure intravenous albumin; see nursing notes for details. FINDINGS: A total of approximately 4.5 of straw-colored fluid was removed. IMPRESSION:  Successful ultrasound-guided therapeutic paracentesis yielding 4.5 liters of peritoneal fluid. Read by: Rushie Nyhan, NP Electronically Signed   By: Miachel Roux M.D.   On: 04/17/2022 16:16   IR THORACENTESIS ASP PLEURAL SPACE W/IMG GUIDE  Result Date: 04/15/2022 INDICATION: Alcoholic cirrhosis, recurrent ascites EXAM: ULTRASOUND GUIDED RIGHT THORACENTESIS MEDICATIONS: None. COMPLICATIONS: None immediate. PROCEDURE: An ultrasound guided thoracentesis was thoroughly discussed with the patient and questions answered. The benefits, risks, alternatives and complications were also discussed. The patient understands and wishes to proceed with the procedure. Written consent was obtained. Ultrasound was performed to localize and mark an adequate pocket of fluid in the right chest. The area was then prepped and draped in the normal sterile fashion. 1% Lidocaine was used for local anesthesia. Under ultrasound guidance a 6 Fr Safe-T-Centesis catheter was introduced. Thoracentesis was performed. The catheter was removed and a dressing applied. FINDINGS: A total of approximately 800cc of amber pleural fluid was removed. IMPRESSION: Successful ultrasound guided right thoracentesis yielding 800cc of pleural fluid. Electronically Signed   By: Lucrezia Europe M.D.   On: 04/15/2022 14:38   DG Chest 1 View  Result Date: 04/15/2022 CLINICAL DATA:  Status post right thoracentesis EXAM: CHEST  1 VIEW COMPARISON:  Multiple prior chest radiographs FINDINGS: Unchanged cardiomediastinal silhouette. Trace right pleural effusion, decreased from prior. No new airspace disease. No pneumothorax. No acute osseous abnormality. IMPRESSION: No pneumothorax after thoracentesis.  Electronically Signed   By: Maurine Simmering M.D.   On: 04/15/2022 14:17   IR Paracentesis  Result Date: 04/10/2022 INDICATION: History of cirrhosis with recurrent ascites. Patient was scheduled for elective TIPS on 03/17/22, the procedure was aborted due to elevated right heart pressure. Right heart pressure remained elevated even after right thoracentesis and paracentesis were performed, procedure was aborted. Request for therapeutic paracentesis. EXAM: ULTRASOUND GUIDED PARACENTESIS MEDICATIONS: 1% lidocaine 10 mL COMPLICATIONS: None immediate. PROCEDURE: Informed written consent was obtained from the patient after a discussion of the risks, benefits and alternatives to treatment. A timeout was performed prior to the initiation of the procedure. Initial ultrasound scanning demonstrates a large amount of ascites within the right lower abdominal quadrant. The right lower abdomen was prepped and draped in the usual sterile fashion. 1% lidocaine was used for local anesthesia. Following this, a 19 gauge, 7-cm, Yueh catheter was introduced. An ultrasound image was saved for documentation purposes. The paracentesis was performed. The catheter was removed and a dressing was applied. The patient tolerated the procedure well without immediate post procedural complication. FINDINGS: A total of approximately 4.5 L of clear yellow fluid was removed. IMPRESSION: Successful ultrasound-guided paracentesis yielding 4.5 liters of peritoneal fluid. Read by: Soyla Dryer, NP PLAN: The patient has previously been evaluated by the [facility] Interventional Radiology [facility] Hypertension [facility], and deemed not a candidate for intervention. Patient underwent TIPS attempt on 03/17/2022 but the procedure was aborted due to elevated RIGHT heart pressures. Michaelle Birks, MD [facility] Electronically Signed   By: Michaelle Birks M.D.   On: 04/10/2022 16:22   IR THORACENTESIS ASP  PLEURAL SPACE W/IMG GUIDE  Result Date: 04/06/2022 INDICATION: Alcoholic cirrhosis, status post tips, recurrent pleural effusion. EXAM: ULTRASOUND GUIDED THERAPEUTIC RIGHT THORACENTESIS MEDICATIONS: 10 mL 1 % lidocaine COMPLICATIONS: None immediate. PROCEDURE: An ultrasound guided thoracentesis was thoroughly discussed with the patient and questions answered. The benefits, risks, alternatives and complications were also discussed. The patient understands and wishes to proceed with the procedure. Written consent was obtained. Ultrasound was performed to localize  and mark an adequate pocket of fluid in the right chest. The area was then prepped and draped in the normal sterile fashion. 1% Lidocaine was used for local anesthesia. Under ultrasound guidance a 6 Fr Safe-T-Centesis catheter was introduced. Thoracentesis was performed. The catheter was removed and a dressing applied. FINDINGS: A total of approximately 1.5 L of clear, golden fluid was removed. IMPRESSION: Successful ultrasound guided right thoracentesis yielding 1.5 L of pleural fluid. Read by: Narda Rutherford, AGNP-BC Electronically Signed   By: Jerilynn Mages.  Shick M.D.   On: 04/06/2022 15:22   DG Chest 1 View  Result Date: 04/06/2022 CLINICAL DATA:  Status post right thoracentesis. EXAM: CHEST  1 VIEW COMPARISON:  03/30/2022. FINDINGS: There is decreased opacity at the right lung base following thoracentesis. The right hemidiaphragm is not visualized. No residual pleural fluid. Small focus of opacity at the right lateral costophrenic sulcus is consistent with atelectasis. Mild opacity at the left lung base is suspected to be a small effusion with associated atelectasis. Remainder of the lungs is clear. Normal heart, mediastinum and hila. No pneumothorax. IMPRESSION: 1. Status post right thoracentesis. No residual pleural fluid visualized. 2. No pneumothorax or other evidence of a procedure complication. Electronically Signed   By: Lajean Manes M.D.   On:  04/06/2022 15:11   IR Paracentesis  Result Date: 04/02/2022 INDICATION: History of cirrhosis with recurrent ascites. Patient was scheduled for elective TIPS on 03/17/22, the procedure was aborted due to elevated right heart pressure. Right heart pressure remained elevated even after right thoracentesis and paracentesis were performed, procedure was aborted. Request for therapeutic paracentesis. EXAM: ULTRASOUND GUIDED  PARACENTESIS MEDICATIONS: 10 mL 1% lidocaine COMPLICATIONS: None immediate. PROCEDURE: Informed written consent was obtained from the patient after a discussion of the risks, benefits and alternatives to treatment. A timeout was performed prior to the initiation of the procedure. Initial ultrasound scanning demonstrates a large amount of ascites within the right lower abdominal quadrant. The right lower abdomen was prepped and draped in the usual sterile fashion. 1% lidocaine was used for local anesthesia. Following this, a 19 gauge, 7-cm, Yueh catheter was introduced. An ultrasound image was saved for documentation purposes. The paracentesis was performed. The catheter was removed and a dressing was applied. The patient tolerated the procedure well without immediate post procedural complication. Patient received post-procedure intravenous albumin; see nursing notes for details. FINDINGS: A total of approximately 4.2 L of hazy yellow fluid was removed. IMPRESSION: Successful ultrasound-guided paracentesis yielding 4.2 liters of peritoneal fluid. PLAN: The patient has previously been formally evaluated by the [facility] Interventional Radiology Portal Hypertension Clinic and is being actively followed for potential future intervention. Read by: Durenda Guthrie, PA-C Electronically Signed   By: Albin Felling M.D.   On: 04/02/2022 14:56   IR THORACENTESIS ASP PLEURAL SPACE W/IMG GUIDE  Result Date: 03/30/2022 INDICATION: Alcoholic cirrhosis, s/p TIPS, pleural effusion EXAM: ULTRASOUND GUIDED RIGHT  THORACENTESIS MEDICATIONS: None. COMPLICATIONS: None immediate. PROCEDURE: An ultrasound guided thoracentesis was thoroughly discussed with the patient and questions answered. The benefits, risks, alternatives and complications were also discussed. The patient understands and wishes to proceed with the procedure. Written consent was obtained. Ultrasound was performed to localize and mark an adequate pocket of fluid in the right chest. The area was then prepped and draped in the normal sterile fashion. 1% Lidocaine was used for local anesthesia. Under ultrasound guidance a 6 Fr Safe-T-Centesis catheter was introduced. Thoracentesis was performed. The catheter was removed and a dressing applied. FINDINGS: A total  of approximately 2L of yellow fluid was removed. IMPRESSION: Successful ultrasound guided right thoracentesis yielding 2L of pleural fluid. Electronically Signed   By: Ruthann Cancer M.D.   On: 03/30/2022 16:39   DG Chest 1 View  Result Date: 03/30/2022 CLINICAL DATA:  Status post thoracentesis. EXAM: CHEST  1 VIEW COMPARISON:  Chest radiograph May 30 23. FINDINGS: No definite pneumothorax in this patient status post thoracentesis. Decreased right pleural effusion. Mild left basilar opacities, probably atelectasis. Cardiomediastinal silhouette is unchanged and within normal limits. No acute osseous abnormality. IMPRESSION: No definite pneumothorax in this patient status post thoracentesis. Decreased right pleural effusion. Electronically Signed   By: Margaretha Sheffield M.D.   On: 03/30/2022 13:37    Labs:  CBC: Recent Labs    03/17/22 1147 03/17/22 1257 05/10/2022 1410 04/19/22 0258 04/20/22 0257  WBC 6.6  --  7.0 4.7 5.8  HGB 14.9  --  11.1* 10.0* 9.5*  HCT 42.8  --  32.4* 28.6* 27.5*  PLT PLATELET CLUMPS NOTED ON SMEAR, UNABLE TO ESTIMATE PLATELET CLUMPS NOTED ON SMEAR, UNABLE TO ESTIMATE 85* 75* 67*    COAGS: Recent Labs    05/27/21 1634 11/07/21 1241 12/10/21 1245 12/23/21 1208  03/17/22 1147 04/25/2022 1755 04/19/22 0258 04/20/22 0812  INR 1.5*   < > 1.6* 1.7* 1.7* 2.1* 2.1* 2.5*  APTT 31  --  35 34 33  --   --   --    < > = values in this interval not displayed.    BMP: Recent Labs    03/17/22 1147 04/21/2022 1422 04/19/22 0258 04/20/22 0257  NA 133* 133* 133* 134*  K 3.0* 3.7 3.1* 3.4*  CL 98 98 100 103  CO2 24 20* 23 23  GLUCOSE 123* 115* 134* 112*  BUN 12 23 23 18   CALCIUM 9.3 9.8 9.4 9.2  CREATININE 0.98 1.32* 1.21 1.13  GFRNONAA >60 56* >60 >60    LIVER FUNCTION TESTS: Recent Labs    03/17/22 1147 04/21/2022 1422 04/19/22 0258 04/20/22 0257  BILITOT 6.3* 10.4* 7.5* 5.8*  AST 90* 49* 40 33  ALT 74* 42 38 33  ALKPHOS 88 56 52 41  PROT 6.2* 5.5* 5.1* 5.1*  ALBUMIN 3.0* 3.4* 3.1* 3.5    TUMOR MARKERS: No results for input(s): "AFPTM", "CEA", "CA199", "CHROMGRNA" in the last 8760 hours.  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 He has been unable to tolerate previously tolerated diuretic dose and is now requiring levophed for BP support. Due to his inability to tolerate diuretic therapy IR has been consulted for possible TIPS placement.  Patient underwent attempted TIPS procedure 03/17/22 however this was aborted due to elevated right heart pressure of 20 mmHg (mean).   Reviewed etiology of cirrhosis, recurrent ascites/hepatic hydrothorax and ectopic varices as well as how they are contributing to his current clinic picture. Dr. Serafina Royals discussed TIPS procedure as well as ectopic varix embolization with patient and his 2 daughters today. They are interested in possibly re-attempting TIPS however they would like to discuss how this would affect his transplant candidacy with the [facility] transplant team first.  Plan: - CTA abd/pelvis BRTO protocol for pre-procedure mapping (ordered), Dr. Serafina Royals will discuss results with family on  7/5 - Family to discuss proceeding with TIPS with transplant team at [facility] to determine if this will affect his transplant timeline - If patient and family agreeable to TIPS/ectopic varix  embolization could potentially proceed 7/7 however patient will require transfer to [facility] for procedure  IR will follow up with family on 7/5 to review CTA and discuss their wishes regarding proceeding.  Thank you for this interesting consult.  I greatly enjoyed meeting KHYSON SEBESTA and look forward to participating in their care.  A copy of this report was sent to the requesting provider on this date.  Electronically Signed: Joaquim Nam, PA-C 04/20/2022, 3:49 PM   I spent a total of 55 Mi[place] in face to face in clinical consultation, greater than 50% of which was counseling/coordinating care for possible TIPS placement.

## 2022-04-20 NOTE — Progress Notes (Signed)
PCCM Brief Progress Note  Still a little hypotensive today MAP 55-low 60s. If sustains MAP <55 then check lactic, BMP.   Will sign off but if there is change in mental status with his hypotension, or if elevated lactic, worsening renal function then we are glad to be reinvolved.  [place]

## 2022-04-20 NOTE — Progress Notes (Signed)
[facility] ADULT ICU REPLACEMENT PROTOCOL   The patient does apply for the [facility] Adult ICU Electrolyte Replacment Protocol based on the criteria listed below:   1.Exclusion criteria: TCTS patients, ECMO patients, and Dialysis patients 2. Is GFR >/= 30 ml/min? Yes.    Patient's GFR today is >60 3. Is SCr </= 2? Yes.   Patient's SCr is 1.13 mg/dL 4. Did SCr increase >/= 0.5 in 24 hours? No. 5.Pt's weight >40kg  Yes.   6. Abnormal electrolyte(s): K  7. Electrolytes replaced per protocol 8.  Call MD STAT for K+ </= 2.5, Phos </= 1, or Mag </= 1 Physician:  Sherlene Shams [facility] 04/20/2022 5:05 AM

## 2022-04-20 NOTE — Progress Notes (Signed)
PROGRESS NOTE    Peter Nichols  OBS:[id number] DOB: 1945/12/11 DOA: 05/03/2022 PCP: [name], MD   Brief Narrative: Peter Nichols is a 76 y.o. male alcoholic cirrhosis of the liver, recurrent ascites, portal hypertension, history of hepatic encephalopathy,  hepatic hydrothorax, large left retroperitoneal varix, chronic thrombocytopenia, he recently underwent ultrasound-guided thoracentesis  yielding 800 cc amber fluids on 6/28 prior to that he had paracentesis 6/23 yielding 4.5 L, his diuretics has been recently increased, he presents with generalized weakness since the morning of admission. He had another paracentesis 6/30 per family yielding 4 L.  His diuretics lasix and spironolactone were increase recently.  He has been so weak, he was not able to goes down stair.  Wife notice some confusion.  He only had one BM the day prior to admission.   Patient denies abdominal pain.    Of note he was evaluated at [facility], not candidate for transplant due to age and co morbidities, but was advised to follow up at [facility]. He was refer to [facility] for second opinion.    Evaluation in the ED: Sodium 133, potassium 3.7, BUN 23, creatinine 1.3, albumin 3.4, AST 49, ALT 42, ammonia and 119, total bilirubin 10.4, BNP 81, lactic acid 3.1---4.3.  Hemoglobin 11, platelets 85. Chest x-ray: Negative   Presented with lactic acidosis, became hypotensive overnight. He has received IV fluids and albumin. CCM consulted. Started on levophed. CCM recommended Therapeutic thoracentesis.    Assessment & Plan:   Principal Problem:   Lactic acidosis Active Problems:   Decompensated hepatic cirrhosis (HCC)   GERD (gastroesophageal reflux disease)   Generalized weakness  1-Generalized weakness;  Suspect related to hypovolemia, from recent paracentesis, thoracentesis, increase diuretics and decompensated cirrhosis -Plan to monitor for hypoglycemia.  -Received IV fluids.  -Hold diuretics.  -He will need PT evaluation.    2-Lactic acidosis// Hypotension. ; Could be related to hypovolemia, hypoperfusion. Also could be related to his liver cirrhosis.  He has received Albumin and IV fluids.  MAP 56. CCM consulted. Received albumin, transient on levophed.  CCM recommend to proceed with Diagnostic paracentesis.  Had paracentesis. Cell count 120, Neutrophil count 8. Culture no growth.  Continue with IV Ceftriaxone.  Per CCM if Map consistently below 55, need to check lactic acid and bmet.   3-Decompensated Cirrhosis of Liver;  Continue with  lactulose.  GI Consulted. Plan to hold diuretics. (At time of resumption of Diuretics plan to resume previous dose before they raise it ) Meld score 28. 19 % estimated 3 month mortality (7/01//2023.)   Continue with IV ceftriaxone.  Monitor for GI bleed.  Hold BB.  3 L removed during paracentesis. CCM  ordered  Albumin.  IR consulted for evaluation for TIPS procedure.   4-Acute metabolic encephalopathy; he has mild confusion.  Resume lactulose, per GI resume prior home dose 30 gr TID>  Continue with  Rifaximin.    superficial vein thrombosis involving the left small sahenous vein.  Monitor. Would avoid NSAID>  Needs follow up doppler.    Urine retention;  He had I and O.  Monitor.   Hypokalemia; replete orally.   Estimated body mass index is 23.86 kg/m as calculated from the following:   Height as of this encounter: 5' 9"  (1.753 m).   Weight as of this encounter: 73.3 kg.   DVT prophylaxis: SCD Code Status: Full code Family Communication: Care discussed with patient and wife at bedside.  Disposition Plan:  Status is: Observation The patient remains OBS  appropriate and will d/c before 2 midnights.    Consultants:  CCM GI  Procedures:  Paracentesis.   Antimicrobials:    Subjective: He is feeling better today.  Mildly confuse per wife.  He denies pain   Objective: Vitals:   04/20/22 0000 04/20/22 0100 04/20/22 0300 04/20/22 0400  BP: (!)  97/52 (!) 102/48 (!) 90/46 (!) 112/47  Pulse: 96 95 99 88  Resp: 20 (!) 23 19 (!) 23  Temp: 98.1 F (36.7 C)  98.9 F (37.2 C) 98.9 F (37.2 C)  TempSrc: Oral  Oral Oral  SpO2: 97% 96% 97% 97%  Weight:      Height:        Intake/Output Summary (Last 24 hours) at 04/20/2022 0714 Last data filed at 04/19/2022 2351 Gross per 24 hour  Intake 212.14 ml  Output 551 ml  Net -338.86 ml    Filed Weights   05/02/2022 1411 04/21/2022 2138  Weight: 71.2 kg 73.3 kg    Examination:  General exam: NAD,icteric  Respiratory system: CTA Cardiovascular system: S 1 , S 2 RRR Gastrointestinal system: BS present, soft, distended,  Central nervous system: Alert, follows command,  Extremities: trace edema  Data Reviewed: I have personally reviewed following labs and imaging studies  CBC: Recent Labs  Lab 04/23/2022 1410 04/19/22 0258 04/20/22 0257  WBC 7.0 4.7 5.8  NEUTROABS  --   --  4.6  HGB 11.1* 10.0* 9.5*  HCT 32.4* 28.6* 27.5*  MCV 103.2* 101.1* 101.5*  PLT 85* 75* 67*    Basic Metabolic Panel: Recent Labs  Lab 04/21/2022 1422 05/06/2022 1755 04/19/22 0258 04/20/22 0257  NA 133*  --  133* 134*  K 3.7  --  3.1* 3.4*  CL 98  --  100 103  CO2 20*  --  23 23  GLUCOSE 115*  --  134* 112*  BUN 23  --  23 18  CREATININE 1.32*  --  1.21 1.13  CALCIUM 9.8  --  9.4 9.2  MG  --  2.0  --   --     GFR: Estimated Creatinine Clearance: 56.5 mL/min (by C-G formula based on SCr of 1.13 mg/dL). Liver Function Tests: Recent Labs  Lab 05/08/2022 1422 04/19/22 0258 04/20/22 0257  AST 49* 40 33  ALT 42 38 33  ALKPHOS 56 52 41  BILITOT 10.4* 7.5* 5.8*  PROT 5.5* 5.1* 5.1*  ALBUMIN 3.4* 3.1* 3.5    No results for input(s): "LIPASE", "AMYLASE" in the last 168 hours. Recent Labs  Lab 04/23/2022 1419 04/19/22 0747  AMMONIA 119* 96*    Coagulation Profile: Recent Labs  Lab 04/23/2022 1755 04/19/22 0258  INR 2.1* 2.1*    Cardiac Enzymes: No results for input(s): "CKTOTAL", "CKMB",  "CKMBINDEX", "TROPONINI" in the last 168 hours. BNP (last 3 results) No results for input(s): "PROBNP" in the last 8760 hours. HbA1C: No results for input(s): "HGBA1C" in the last 72 hours. CBG: Recent Labs  Lab 04/19/22 1148 04/19/22 1543 04/19/22 2051 04/19/22 2329 04/20/22 0339  GLUCAP 152* 169* 126* 122* 110*    Lipid Profile: No results for input(s): "CHOL", "HDL", "LDLCALC", "TRIG", "CHOLHDL", "LDLDIRECT" in the last 72 hours. Thyroid Function Tests: No results for input(s): "TSH", "T4TOTAL", "FREET4", "T3FREE", "THYROIDAB" in the last 72 hours. Anemia Panel: No results for input(s): "VITAMINB12", "FOLATE", "FERRITIN", "TIBC", "IRON", "RETICCTPCT" in the last 72 hours. Sepsis Labs: Recent Labs  Lab 05/03/2022 1603 05/16/2022 1930 05/15/2022 2229 04/19/22 0747 04/20/22 0257  PROCALCITON  --   --   --  0.18 0.18  LATICACIDVEN 4.3* 4.3* 4.0* 2.6*  --      Recent Results (from the past 240 hour(s))  MRSA Next Gen by PCR, Nasal     Status: None   Collection Time: 05/09/2022  9:56 PM   Specimen: Nasal Mucosa; Nasal Swab  Result Value Ref Range Status   MRSA by PCR Next Gen NOT DETECTED NOT DETECTED Final    Comment: (NOTE) The GeneXpert MRSA Assay (FDA approved for NASAL specimens only), is one component of a comprehensive MRSA colonization surveillance program. It is not intended to diagnose MRSA infection nor to guide or monitor treatment for MRSA infections. Test performance is not FDA approved in patients less than 1 years old. Performed at [facility], [place] [address] [place], [place] [address]   Gram stain     Status: None   Collection Time: 04/19/22  1:45 PM   Specimen: PATH Cytology Peritoneal fluid  Result Value Ref Range Status   Specimen Description   Final    FLUID Performed at [facility] [address], [place], [place] [address]    Special Requests   Final    NONE Performed at [facility],  [place] [address] [place], [place] [address]    Gram Stain   Final    NO SQUAMOUS EPITHELIAL CELLS SEEN RARE WBC SEEN NO ORGANISMS SEEN Performed at [facility], [place] [address] [place], [place] [address]    Report Status 04/20/2022 FINAL  Final  Body fluid culture w Gram Stain     Status: None (Preliminary result)   Collection Time: 04/19/22  1:45 PM   Specimen: PATH Cytology Peritoneal fluid  Result Value Ref Range Status   Specimen Description   Final    FLUID Performed at [facility] [address], [place], [place] [address]    Special Requests   Final    NONE Performed at [facility], [place] [address], [place], [place] [address]    Gram Stain   Final    NO SQUAMOUS EPITHELIAL CELLS SEEN FEW WBC SEEN NO ORGANISMS SEEN Performed at [facility], [place] [address], [place], [place] [address]    Culture PENDING  Incomplete   Report Status PENDING  Incomplete         Radiology Studies: US Paracentesis  Result Date: 04/19/2022 INDICATION: History of cirrhosis with recurrent ascites. Patient was scheduled for elective tips on 03/17/2022. The procedure was aborted due to elevated right heart pressure right heart pressure remained elevated even after right thoracentesis and paracentesis was performed, procedure was aborted. Request for diagnostic paracentesis. EXAM: ULTRASOUND GUIDED DIAGNOSTIC  RIGHT LOWER QUADRANT PARACENTESIS MEDICATIONS: 10 mL 1 % lidocaine COMPLICATIONS: None immediate. PROCEDURE: Informed written consent was obtained from the patient after a discussion of the risks, benefits and alternatives to treatment. A timeout was performed prior to the initiation of the procedure. Initial ultrasound scanning demonstrates a large amount of ascites within the right lower abdominal quadrant. The right lower abdomen was prepped and draped in the usual sterile fashion. 1% lidocaine was used for local anesthesia. Following this,  a 19 gauge, 7-cm, Yueh catheter was introduced. An ultrasound image was saved for documentation purposes. The paracentesis was performed. The catheter was removed and a dressing was applied. The patient tolerated the procedure well without immediate post procedural complication. Patient received post-procedure intravenous albumin; see nursing notes for details. FINDINGS: A total of approximately 3 L  of clear, golden colored fluid was removed. Samples were sent to the laboratory as requested by the clinical team. IMPRESSION: Successful ultrasound-guided paracentesis yielding 3 liters of peritoneal fluid. Read by: Narda Rutherford, AGNP-BC PLAN: The patient has previously been evaluated by the [facility], and deemed not a candidate for intervention. Electronically Signed   By: Jacqulynn Cadet M.D.   On: 04/19/2022 13:28   VAS [place] LOWER EXTREMITY VENOUS (DVT) (7a-7p)  Result Date: 04/19/2022  Lower Venous DVT Study Patient Name:  Peter Nichols  Date of Exam:   05/10/2022 Medical Rec #: [id number]        Accession #:    [id number] Date of Birth: May 24, 1946         Patient Gender: M Patient Age:   22 years Exam Location:  [facility] Procedure:      VAS [place] LOWER EXTREMITY VENOUS (DVT) Referring Phys: Ova Freshwater FONDAW --------------------------------------------------------------------------------  Indications: Pain.  Risk Factors: None identified. Limitations: Poor ultrasound/tissue interface. Comparison Study: No prior studies. Performing Technologist: Oliver Hum RVT  Examination Guidelines: A complete evaluation includes B-mode imaging, spectral Doppler, color Doppler, and power Doppler as needed of all accessible portions of each vessel. Bilateral testing is considered an integral part of a complete examination. Limited examinations for reoccurring indications may be performed as noted. The reflux portion of the exam is performed with the patient in reverse  Trendelenburg.  +-----+---------------+---------+-----------+----------+--------------+ RIGHTCompressibilityPhasicitySpontaneityPropertiesThrombus Aging +-----+---------------+---------+-----------+----------+--------------+ CFV  Full           Yes      Yes                                 +-----+---------------+---------+-----------+----------+--------------+   +---------+---------------+---------+-----------+----------+-----------------+ LEFT     CompressibilityPhasicitySpontaneityPropertiesThrombus Aging    +---------+---------------+---------+-----------+----------+-----------------+ CFV      Full           Yes      Yes                                    +---------+---------------+---------+-----------+----------+-----------------+ SFJ      Full                                                           +---------+---------------+---------+-----------+----------+-----------------+ FV Prox  Full                                                           +---------+---------------+---------+-----------+----------+-----------------+ FV Mid   Full                                                           +---------+---------------+---------+-----------+----------+-----------------+ FV DistalFull                                                           +---------+---------------+---------+-----------+----------+-----------------+  PFV      Full                                                           +---------+---------------+---------+-----------+----------+-----------------+ POP      Full           Yes      Yes                                    +---------+---------------+---------+-----------+----------+-----------------+ PTV      Full                                                           +---------+---------------+---------+-----------+----------+-----------------+ PERO     Full                                                            +---------+---------------+---------+-----------+----------+-----------------+ SSV      None                                         Age Indeterminate +---------+---------------+---------+-----------+----------+-----------------+ The thrombus noted in the short saphenous vein extends from the proximal calf into the confluence with the popliteal vein. The thrombus does not appear to extend into the popliteal vein.    Summary: RIGHT: - No evidence of common femoral vein obstruction.  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 - There is no evidence of deep vein thrombosis in the lower extremity.  - No cystic structure found in the popliteal fossa. - The thrombus noted in the short saphenous vein extends from the proximal calf into the confluence with the popliteal vein. The thrombus does not appear to extend into the popliteal vein.  *See table(s) above for measurements and observations. Electronically signed by Jamelle Haring on 04/19/2022 at 12:21:05 PM.    Final    ECHOCARDIOGRAM COMPLETE  Result Date: 04/19/2022    ECHOCARDIOGRAM REPORT   Patient Name:   Peter Nichols Date of Exam: 04/19/2022 Medical Rec #:  [id number]       Height:       69.0 in Accession #:    [id number]      Weight:       161.6 lb Date of Birth:  1946/09/28        BSA:          1.887 m Patient Age:    77 years        BP:           135/54 mmHg Patient Gender: M               HR:           89 bpm. Exam Location:  Inpatient Procedure: 2D  Echo, Cardiac Doppler and Color Doppler Indications:    R06.02 SOB  History:        Patient has prior history of Echocardiogram examinations, most                 recent 03/06/2022. Risk Factors:Hypertension and Dyslipidemia.                 ETOH.  Sonographer:    Roseanna Rainbow RDCS Referring Phys: [id number] Hortencia Conradi [facility]  Sonographer Comments: Technically difficult study due to poor echo windows. Image acquisition challenging  due to respiratory motion. Diffucult windows, patient could not raise left arm adaquately. Attempted to turn IMPRESSIONS  1. Left ventricular ejection fraction, by estimation, is 60 to 65%. The left ventricle has normal function. The left ventricle has no regional wall motion abnormalities. Left ventricular diastolic parameters were normal.  2. Right ventricular systolic function is normal. The right ventricular size is normal.  3. Left atrial size was mildly dilated.  4. The mitral valve is normal in structure. No evidence of mitral valve regurgitation. No evidence of mitral stenosis.  5. The aortic valve was not well visualized. There is mild calcification of the aortic valve. There is mild thickening of the aortic valve. Aortic valve regurgitation is not visualized. Aortic valve sclerosis is present, with no evidence of aortic valve  stenosis.  6. The inferior vena cava is dilated in size with >50% respiratory variability, suggesting right atrial pressure of 8 mmHg. FINDINGS  Left Ventricle: Left ventricular ejection fraction, by estimation, is 60 to 65%. The left ventricle has normal function. The left ventricle has no regional wall motion abnormalities. The left ventricular internal cavity size was normal in size. There is  no left ventricular hypertrophy. Left ventricular diastolic parameters were normal. Right Ventricle: The right ventricular size is normal. No increase in right ventricular wall thickness. Right ventricular systolic function is normal. Left Atrium: Left atrial size was mildly dilated. Right Atrium: Right atrial size was normal in size. Pericardium: There is no evidence of pericardial effusion. Mitral Valve: The mitral valve is normal in structure. No evidence of mitral valve regurgitation. No evidence of mitral valve stenosis. Tricuspid Valve: The tricuspid valve is normal in structure. Tricuspid valve regurgitation is mild . No evidence of tricuspid stenosis. Aortic Valve: The aortic valve  was not well visualized. There is mild calcification of the aortic valve. There is mild thickening of the aortic valve. Aortic valve regurgitation is not visualized. Aortic valve sclerosis is present, with no evidence of aortic valve stenosis. Pulmonic Valve: The pulmonic valve was normal in structure. Pulmonic valve regurgitation is not visualized. No evidence of pulmonic stenosis. Aorta: The aortic root is normal in size and structure. Venous: The inferior vena cava is dilated in size with greater than 50% respiratory variability, suggesting right atrial pressure of 8 mmHg. IAS/Shunts: No atrial level shunt detected by color flow Doppler.  LEFT VENTRICLE PLAX 2D LVIDd:         4.70 cm     Diastology LVIDs:         2.70 cm     LV e' medial:    9.90 cm/s LV PW:         1.00 cm     LV E/e' medial:  7.6 LV IVS:        0.90 cm     LV e' lateral:   7.94 cm/s LVOT diam:     2.40 cm     LV E/e'  lateral: 9.5 LV SV:         106 LV SV Index:   56 LVOT Area:     4.52 cm  LV Volumes (MOD) LV vol d, MOD A2C: 64.0 ml LV vol d, MOD A4C: 92.1 ml LV vol s, MOD A2C: 15.6 ml LV vol s, MOD A4C: 20.3 ml LV SV MOD A2C:     48.4 ml LV SV MOD A4C:     92.1 ml LV SV MOD BP:      58.9 ml RIGHT VENTRICLE             IVC RV S prime:     28.90 cm/s  IVC diam: 2.30 cm TAPSE (M-mode): 2.7 cm LEFT ATRIUM             Index        RIGHT ATRIUM           Index LA diam:        4.10 cm 2.17 cm/m   RA Area:     11.80 cm LA Vol (A2C):   43.0 ml 22.79 ml/m  RA Volume:   23.10 ml  12.24 ml/m LA Vol (A4C):   39.8 ml 21.09 ml/m LA Biplane Vol: 40.9 ml 21.67 ml/m  AORTIC VALVE LVOT Vmax:   117.00 cm/s LVOT Vmean:  77.700 cm/s LVOT VTI:    0.234 m  AORTA Ao Root diam: 3.70 cm Ao Asc diam:  3.20 cm MITRAL VALVE MV Area (PHT): 3.88 cm    SHUNTS MV Decel Time: 196 msec    Systemic VTI:  0.23 m MV E velocity: 75.50 cm/s  Systemic Diam: 2.40 cm MV A velocity: 95.05 cm/s MV E/A ratio:  0.79 Jenkins Rouge MD Electronically signed by Jenkins Rouge MD Signature  Date/Time: 04/19/2022/11:36:58 AM    Final    DG CHEST PORT 1 VIEW  Result Date: 04/19/2022 CLINICAL DATA:  76 year old male with history of hypotension. Lactic acidosis. EXAM: PORTABLE CHEST 1 VIEW COMPARISON:  Chest x-ray 05/08/2022. FINDINGS: Skin fold artifact projecting over the lower left hemithorax. Lung volumes are low. No consolidative airspace disease. No pleural effusions. No pneumothorax. No pulmonary nodule or mass noted. Pulmonary vasculature and the cardiomediastinal silhouette are within normal limits. IMPRESSION: 1. Low lung volumes without radiographic evidence of acute cardiopulmonary disease. Electronically Signed   By: Vinnie Langton M.D.   On: 04/19/2022 07:28   DG Chest 2 View  Result Date: 05/14/2022 CLINICAL DATA:  Generalized weakness since this morning, cirrhosis post paracentesis yesterday, diuretics increased yesterday, patient reports up all night urinating. History hypertension, NASH, GERD EXAM: CHEST - 2 VIEW COMPARISON:  04/15/2022 FINDINGS: Normal heart size, mediastinal contours, and pulmonary vascularity. Lungs clear. No pulmonary infiltrate, pleural effusion, or pneumothorax. Scattered endplate spur formation thoracic spine. IMPRESSION: No acute abnormalities. Electronically Signed   By: Lavonia Dana M.D.   On: 04/19/2022 15:13        Scheduled Meds:  Chlorhexidine Gluconate Cloth  6 each Topical Daily   lactulose  30 g Oral TID   pantoprazole (PROTONIX) IV  40 mg Intravenous Q12H   rifaximin  550 mg Oral BID   Continuous Infusions:  sodium chloride 250 mL (04/20/22 0642)   albumin human     cefTRIAXone (ROCEPHIN)  IV Stopped (04/19/22 1911)   norepinephrine (LEVOPHED) Adult infusion Stopped (04/19/22 1300)     LOS: 1 day    Time spent: 35 minutes.     Elmarie Shiley, MD [facility]  If 7PM-7AM, please contact night-coverage [email or web address]  04/20/2022, 7:14 AM

## 2022-04-20 NOTE — Progress Notes (Signed)
[facility] Progress Note  Peter Nichols 76 y.o. [birth year]   Subjective: Seen and examined laying in bed accompanied by his wife.  Patient reports feeling improved today.  Last bowel movement last night.  he is oriented to name, date and location.  Tolerating diet well.  ROS : Review of Systems  Gastrointestinal:  Negative for abdominal pain, blood in stool, constipation, diarrhea, heartburn, melena, nausea and vomiting.  Genitourinary:  Negative for dysuria and urgency.     Objective: Vital signs in last 24 hours: Vitals:   04/20/22 0700 04/20/22 0800  BP: (!) 121/42   Pulse: 85   Resp: 18   Temp:  98 F (36.7 C)  SpO2: 97%     Physical Exam:  General:  Alert, cooperative, no distress, appears stated age, mildly jaundiced  Head:  Normocephalic, without obvious abnormality, atraumatic  Eyes:  icteric sclera, EOM's intact  Lungs:   Clear to auscultation bilaterally, respirations unlabored  Heart:  Regular rate and rhythm, S1, S2 normal  Abdomen:   Soft, non-tender, bowel sounds active all four quadrants,  no masses,   Extremities: Extremities normal, atraumatic, no  edema, slight asterixis of the right hand  Pulses: 2+ and symmetric    Lab Results: Recent Labs    04/23/2022 1755 04/19/22 0258 04/20/22 0257  NA  --  133* 134*  K  --  3.1* 3.4*  CL  --  100 103  CO2  --  23 23  GLUCOSE  --  134* 112*  BUN  --  23 18  CREATININE  --  1.21 1.13  CALCIUM  --  9.4 9.2  MG 2.0  --   --    Recent Labs    04/19/22 0258 04/20/22 0257  AST 40 33  ALT 38 33  ALKPHOS 52 41  BILITOT 7.5* 5.8*  PROT 5.1* 5.1*  ALBUMIN 3.1* 3.5   Recent Labs    04/19/22 0258 04/20/22 0257  WBC 4.7 5.8  NEUTROABS  --  4.6  HGB 10.0* 9.5*  HCT 28.6* 27.5*  MCV 101.1* 101.5*  PLT 75* 67*   Recent Labs    04/19/22 0258 04/20/22 0812  LABPROT 23.2* 26.6*  INR 2.1* 2.5*      Assessment Alcoholic cirrhosis Hepatic encephalopathy  HGB 9.5 Platelets 67 AST 33  ALT 33  Alkphos 41 TBili 5.8 GFR >60  INR 04/20/2022 2.5  MELD 3.0: 25 at 04/20/2022  8:12 AM MELD-Na: 26 at 04/20/2022  8:12 AM Calculated from: Serum Creatinine: 1.13 mg/dL at 04/20/2022  2:57 AM Serum Sodium: 134 mmol/L at 04/20/2022  2:57 AM Total Bilirubin: 5.8 mg/dL at 04/20/2022  2:57 AM Serum Albumin: 3.5 g/dL at 04/20/2022  2:57 AM INR(ratio): 2.5 at 04/20/2022  8:12 AM Age at listing (hypothetical): 43 years Sex: Male at 04/20/2022  8:12 AM Maddrey's DF: 73.0   Ammonia 04/19/2022: 96 (119)  Paracentesis 7/2 Negative for SBP, 3L removed.   Patient with hepatic encephalopathy on presentation after decreasing lactulose due to diarrhea.  Patient is feeling improved today with increasing lactulose.  Asterixis on exam.  Patient is oriented x3.    Plan: Continue lactulose 30 g 3 times daily. Continue Xifaxan 550 mg twice daily. 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  Paracentesis fluid analysis not consistent with SBP Continue sodium reduced diet. [facility] will follow.   Arvella Nigh Deirdre Gryder PA-C 04/20/2022, 11:00 AM  Contact #  [phone]

## 2022-04-21 DIAGNOSIS — E872 Acidosis, unspecified: Secondary | ICD-10-CM | POA: Diagnosis not present

## 2022-04-21 LAB — CBC WITH DIFFERENTIAL/PLATELET
Abs Immature Granulocytes: 0.02 10*3/uL (ref 0.00–0.07)
Basophils Absolute: 0 10*3/uL (ref 0.0–0.1)
Basophils Relative: 1 %
Eosinophils Absolute: 0.4 10*3/uL (ref 0.0–0.5)
Eosinophils Relative: 9 %
HCT: 28.8 % — ABNORMAL LOW (ref 39.0–52.0)
Hemoglobin: 9.7 g/dL — ABNORMAL LOW (ref 13.0–17.0)
Immature Granulocytes: 0 %
Lymphocytes Relative: 10 %
Lymphs Abs: 0.5 10*3/uL — ABNORMAL LOW (ref 0.7–4.0)
MCH: 34.9 pg — ABNORMAL HIGH (ref 26.0–34.0)
MCHC: 33.7 g/dL (ref 30.0–36.0)
MCV: 103.6 fL — ABNORMAL HIGH (ref 80.0–100.0)
Monocytes Absolute: 0.6 10*3/uL (ref 0.1–1.0)
Monocytes Relative: 13 %
Neutro Abs: 3.1 10*3/uL (ref 1.7–7.7)
Neutrophils Relative %: 67 %
Platelets: 63 10*3/uL — ABNORMAL LOW (ref 150–400)
RBC: 2.78 MIL/uL — ABNORMAL LOW (ref 4.22–5.81)
RDW: 15.3 % (ref 11.5–15.5)
WBC: 4.5 10*3/uL (ref 4.0–10.5)
nRBC: 0 % (ref 0.0–0.2)

## 2022-04-21 LAB — COMPREHENSIVE METABOLIC PANEL
ALT: 28 U/L (ref 0–44)
AST: 30 U/L (ref 15–41)
Albumin: 3.3 g/dL — ABNORMAL LOW (ref 3.5–5.0)
Alkaline Phosphatase: 46 U/L (ref 38–126)
Anion gap: 10 (ref 5–15)
BUN: 16 mg/dL (ref 8–23)
CO2: 21 mmol/L — ABNORMAL LOW (ref 22–32)
Calcium: 9.1 mg/dL (ref 8.9–10.3)
Chloride: 103 mmol/L (ref 98–111)
Creatinine, Ser: 1.13 mg/dL (ref 0.61–1.24)
GFR, Estimated: >60 mL/min (ref >60)
Glucose, Bld: 140 mg/dL — ABNORMAL HIGH (ref 70–99)
Potassium: 3.3 mmol/L — ABNORMAL LOW (ref 3.5–5.1)
Sodium: 134 mmol/L — ABNORMAL LOW (ref 135–145)
Total Bilirubin: 4.2 mg/dL — ABNORMAL HIGH (ref 0.3–1.2)
Total Protein: 4.8 g/dL — ABNORMAL LOW (ref 6.5–8.1)

## 2022-04-21 LAB — GLUCOSE, CAPILLARY
Glucose-Capillary: 144 mg/dL — ABNORMAL HIGH (ref 70–99)
Glucose-Capillary: 107 mg/dL — ABNORMAL HIGH (ref 70–99)
Glucose-Capillary: 134 mg/dL — ABNORMAL HIGH (ref 70–99)
Glucose-Capillary: 199 mg/dL — ABNORMAL HIGH (ref 70–99)
Glucose-Capillary: 141 mg/dL — ABNORMAL HIGH (ref 70–99)

## 2022-04-21 LAB — PROCALCITONIN: Procalcitonin: 0.17 ng/mL

## 2022-04-21 LAB — PROTIME-INR
INR: 2.3 — ABNORMAL HIGH (ref 0.8–1.2)
Prothrombin Time: 25.2 seconds — ABNORMAL HIGH (ref 11.4–15.2)

## 2022-04-21 MED ORDER — LACTULOSE 10 GM/15ML PO SOLN
20.0000 g | Freq: Once | ORAL | Status: AC
  Administered 2022-04-21: 20 g via ORAL
  Filled 2022-04-21: qty 30

## 2022-04-21 MED ORDER — POTASSIUM CHLORIDE 20 MEQ PO PACK
40.0000 meq | PACK | Freq: Once | ORAL | Status: AC
  Administered 2022-04-21: 40 meq via ORAL
  Filled 2022-04-21: qty 2

## 2022-04-21 NOTE — Progress Notes (Signed)
Subjective: Patient reports feeling well. Tolerating regular diet. Has abdominal distention but denies abdominal pain.  Objective: Vital signs in last 24 hours: Temp:  [97.9 F (36.6 C)-98.8 F (37.1 C)] 98 F (36.7 C) (07/04 0800) Pulse Rate:  [48-102] 86 (07/04 0800) Resp:  [17-27] 23 (07/04 0800) BP: (79-126)/(34-57) 126/52 (07/04 0800) SpO2:  [94 %-100 %] 94 % (07/04 0800) Weight change:  Last BM Date : 04/20/22  PE: Icteric, mild pallor GENERAL: Alert, awake, oriented x3  ABDOMEN: Distended abdomen, ascites present, not tense EXTREMITIES: Minimal bipedal pitting edema  Lab Results: Results for orders placed or performed during the hospital encounter of 05/12/2022 (from the past 48 hour(s))  Glucose, capillary     Status: Abnormal   Collection Time: 04/19/22 11:48 AM  Result Value Ref Range   Glucose-Capillary 152 (H) 70 - 99 mg/dL    Comment: Glucose reference range applies only to samples taken after fasting for at least 8 hours.   Comment 1 Notify RN    Comment 2 Document in Chart   Lactate dehydrogenase (pleural or peritoneal fluid)     Status: Abnormal   Collection Time: 04/19/22  1:45 PM  Result Value Ref Range   LD, Fluid 34 (H) 3 - 23 U/L    Comment: (NOTE) Results should be evaluated in conjunction with serum values    Fluid Type-FLDH CYTO PERI     Comment: Performed at [facility], [place] [address] [place], [place] [address]  Body fluid cell count with differential     Status: Abnormal   Collection Time: 04/19/22  1:45 PM  Result Value Ref Range   Fluid Type-FCT CYTO PERI    Color, Fluid YELLOW (A) YELLOW   Appearance, Fluid CLEAR (A) CLEAR   Total Nucleated Cell Count, Fluid 120 0 - 1,000 cu mm   Neutrophil Count, Fluid 8 0 - 25 %   Lymphs, Fluid 50 %   Monocyte-Macrophage-Serous Fluid 42 (L) 50 - 90 %   Eos, Fluid 0 %   Other Cells, Fluid OTHER CELLS IDENTIFIED AS MESOTHELIAL CELLS %    Comment: CYTOSPIN SMEAR Performed at [place] [address], [place], [place] [address]   Gram stain     Status: None   Collection Time: 04/19/22  1:45 PM   Specimen: PATH Cytology Peritoneal fluid  Result Value Ref Range   Specimen Description      FLUID Performed at [place] [address], [place], [place] [address]    Special Requests      NONE Performed at [facility], [place] [address] [place], [place] [address]    Gram Stain      NO SQUAMOUS EPITHELIAL CELLS SEEN RARE WBC SEEN NO ORGANISMS SEEN Performed at [facility], [place] [address], [place], [place] [address]    Report Status 04/20/2022 FINAL   Protein, pleural or peritoneal fluid     Status: None   Collection Time: 04/19/22  1:45 PM  Result Value Ref Range   Total protein, fluid <3.0 g/dL    Comment: (NOTE) No normal range established for this test Results should be evaluated in conjunction with serum values    Fluid Type-FTP CYTO PERI     Comment: Performed at [facility], [place] [address], [place],  [address]  Body fluid culture w Gram Stain     Status: None (Preliminary result)   Collection Time: 04/19/22  1:45 PM  Specimen: PATH Cytology Peritoneal fluid  Result Value Ref Range   Specimen Description      FLUID Performed at [place] [address], [place], [place] [address]    Special Requests      NONE Performed at [facility], [place] [address] [place], [place] [address]    Gram Stain      NO SQUAMOUS EPITHELIAL CELLS SEEN FEW WBC SEEN NO ORGANISMS SEEN    Culture      NO GROWTH < 12 HOURS Performed at [facility], [place] [address] [place], [place] [address]    Report Status PENDING   Pathologist smear review     Status: None   Collection Time: 04/19/22  1:45 PM  Result Value Ref Range   Path Review Reviewed By Violet Baldy, M.D.     Comment: 7.3.2023        REACTIVE MESOTHELIAL CELLS  AND MACROPHAGES PRESENT, NO ATYPIA SEEN. Performed at [facility], [place] [address], [place], [place] [address]   Glucose, capillary     Status: Abnormal   Collection Time: 04/19/22  3:43 PM  Result Value Ref Range   Glucose-Capillary 169 (H) 70 - 99 mg/dL    Comment: Glucose reference range applies only to samples taken after fasting for at least 8 hours.  Glucose, capillary     Status: Abnormal   Collection Time: 04/19/22  8:51 PM  Result Value Ref Range   Glucose-Capillary 126 (H) 70 - 99 mg/dL    Comment: Glucose reference range applies only to samples taken after fasting for at least 8 hours.  Glucose, capillary     Status: Abnormal   Collection Time: 04/19/22 11:29 PM  Result Value Ref Range   Glucose-Capillary 122 (H) 70 - 99 mg/dL    Comment: Glucose reference range applies only to samples taken after fasting for at least 8 hours.  Procalcitonin     Status: None   Collection Time: 04/20/22  2:57 AM  Result Value Ref Range   Procalcitonin 0.18 ng/mL    Comment:        Interpretation: PCT (Procalcitonin) <= 0.5 ng/mL: Systemic infection (sepsis) is not likely. Local bacterial infection is possible. (NOTE)       Sepsis PCT Algorithm           Lower Respiratory Tract                                      Infection PCT Algorithm    ----------------------------     ----------------------------         PCT < 0.25 ng/mL                PCT < 0.10 ng/mL          Strongly encourage             Strongly discourage   discontinuation of antibiotics    initiation of antibiotics    ----------------------------     -----------------------------       PCT 0.25 - 0.50 ng/mL            PCT 0.10 - 0.25 ng/mL               OR       >80% decrease in PCT            Discourage initiation of  antibiotics      Encourage discontinuation           of antibiotics    ----------------------------     -----------------------------          PCT >= 0.50 ng/mL              PCT 0.26 - 0.50 ng/mL               AND        <80% decrease in PCT             Encourage initiation of                                             antibiotics       Encourage continuation           of antibiotics    ----------------------------     -----------------------------        PCT >= 0.50 ng/mL                  PCT > 0.50 ng/mL               AND         increase in PCT                  Strongly encourage                                      initiation of antibiotics    Strongly encourage escalation           of antibiotics                                     -----------------------------                                           PCT <= 0.25 ng/mL                                                 OR                                        > 80% decrease in PCT                                      Discontinue / Do not initiate                                             antibiotics  Performed at [place] [address] [place], [place] [address]   Comprehensive metabolic panel     Status: Abnormal  Collection Time: 04/20/22  2:57 AM  Result Value Ref Range   Sodium 134 (L) 135 - 145 mmol/L   Potassium 3.4 (L) 3.5 - 5.1 mmol/L   Chloride 103 98 - 111 mmol/L   CO2 23 22 - 32 mmol/L   Glucose, Bld 112 (H) 70 - 99 mg/dL    Comment: Glucose reference range applies only to samples taken after fasting for at least 8 hours.   BUN 18 8 - 23 mg/dL   Creatinine, Ser 1.13 0.61 - 1.24 mg/dL   Calcium 9.2 8.9 - 10.3 mg/dL   Total Protein 5.1 (L) 6.5 - 8.1 g/dL   Albumin 3.5 3.5 - 5.0 g/dL   AST 33 15 - 41 U/L   ALT 33 0 - 44 U/L   Alkaline Phosphatase 41 38 - 126 U/L   Total Bilirubin 5.8 (H) 0.3 - 1.2 mg/dL   GFR, Estimated >60 >60 mL/min    Comment: (NOTE) Calculated using the CKD-EPI Creatinine Equation (2021)    Anion gap 8 5 - 15    Comment: Performed at [facility], [place] [address]  [place], [place] [address]  CBC with Differential/Platelet     Status: Abnormal   Collection Time: 04/20/22  2:57 AM  Result Value Ref Range   WBC 5.8 4.0 - 10.5 K/uL   RBC 2.71 (L) 4.22 - 5.81 MIL/uL   Hemoglobin 9.5 (L) 13.0 - 17.0 g/dL   HCT 27.5 (L) 39.0 - 52.0 %   MCV 101.5 (H) 80.0 - 100.0 fL   MCH 35.1 (H) 26.0 - 34.0 pg   MCHC 34.5 30.0 - 36.0 g/dL   RDW 15.4 11.5 - 15.5 %   Platelets 67 (L) 150 - 400 K/uL    Comment: Immature Platelet Fraction may be clinically indicated, consider ordering this additional test [id number]    nRBC 0.0 0.0 - 0.2 %   Neutrophils Relative % 77 %   Neutro Abs 4.6 1.7 - 7.7 K/uL   Lymphocytes Relative 5 %   Lymphs Abs 0.3 (L) 0.7 - 4.0 K/uL   Monocytes Relative 9 %   Monocytes Absolute 0.5 0.1 - 1.0 K/uL   Eosinophils Relative 7 %   Eosinophils Absolute 0.4 0.0 - 0.5 K/uL   Basophils Relative 1 %   Basophils Absolute 0.0 0.0 - 0.1 K/uL   Immature Granulocytes 1 %   Abs Immature Granulocytes 0.03 0.00 - 0.07 K/uL    Comment: Performed at [facility], [place] [address], [place], [place] [address]  Glucose, capillary     Status: Abnormal   Collection Time: 04/20/22  3:39 AM  Result Value Ref Range   Glucose-Capillary 110 (H) 70 - 99 mg/dL    Comment: Glucose reference range applies only to samples taken after fasting for at least 8 hours.  Glucose, capillary     Status: Abnormal   Collection Time: 04/20/22  7:21 AM  Result Value Ref Range   Glucose-Capillary 104 (H) 70 - 99 mg/dL    Comment: Glucose reference range applies only to samples taken after fasting for at least 8 hours.  Protime-INR     Status: Abnormal   Collection Time: 04/20/22  8:12 AM  Result Value Ref Range   Prothrombin Time 26.6 (H) 11.4 - 15.2 seconds   INR 2.5 (H) 0.8 - 1.2    Comment: (NOTE) INR goal varies based on device and disease states. Performed at [facility], [place] [address] [place], [place]  [address]   Glucose, capillary      Status: Abnormal   Collection Time: 04/20/22 11:42 AM  Result Value Ref Range   Glucose-Capillary 187 (H) 70 - 99 mg/dL    Comment: Glucose reference range applies only to samples taken after fasting for at least 8 hours.  Glucose, capillary     Status: Abnormal   Collection Time: 04/20/22  4:32 PM  Result Value Ref Range   Glucose-Capillary 128 (H) 70 - 99 mg/dL    Comment: Glucose reference range applies only to samples taken after fasting for at least 8 hours.  Glucose, capillary     Status: Abnormal   Collection Time: 04/20/22  7:41 PM  Result Value Ref Range   Glucose-Capillary 167 (H) 70 - 99 mg/dL    Comment: Glucose reference range applies only to samples taken after fasting for at least 8 hours.  Glucose, capillary     Status: Abnormal   Collection Time: 04/20/22 11:55 PM  Result Value Ref Range   Glucose-Capillary 133 (H) 70 - 99 mg/dL    Comment: Glucose reference range applies only to samples taken after fasting for at least 8 hours.  Glucose, capillary     Status: Abnormal   Collection Time: 04/21/22  3:02 AM  Result Value Ref Range   Glucose-Capillary 144 (H) 70 - 99 mg/dL    Comment: Glucose reference range applies only to samples taken after fasting for at least 8 hours.  Procalcitonin     Status: None   Collection Time: 04/21/22  3:03 AM  Result Value Ref Range   Procalcitonin 0.17 ng/mL    Comment:        Interpretation: PCT (Procalcitonin) <= 0.5 ng/mL: Systemic infection (sepsis) is not likely. Local bacterial infection is possible. (NOTE)       Sepsis PCT Algorithm           Lower Respiratory Tract                                      Infection PCT Algorithm    ----------------------------     ----------------------------         PCT < 0.25 ng/mL                PCT < 0.10 ng/mL          Strongly encourage             Strongly discourage   discontinuation of antibiotics    initiation of antibiotics    ----------------------------      -----------------------------       PCT 0.25 - 0.50 ng/mL            PCT 0.10 - 0.25 ng/mL               OR       >80% decrease in PCT            Discourage initiation of                                            antibiotics      Encourage discontinuation           of antibiotics    ----------------------------     -----------------------------  PCT >= 0.50 ng/mL              PCT 0.26 - 0.50 ng/mL               AND        <80% decrease in PCT             Encourage initiation of                                             antibiotics       Encourage continuation           of antibiotics    ----------------------------     -----------------------------        PCT >= 0.50 ng/mL                  PCT > 0.50 ng/mL               AND         increase in PCT                  Strongly encourage                                      initiation of antibiotics    Strongly encourage escalation           of antibiotics                                     -----------------------------                                           PCT <= 0.25 ng/mL                                                 OR                                        > 80% decrease in PCT                                      Discontinue / Do not initiate                                             antibiotics  Performed at [place] [address] [place], [place] [address]   Comprehensive metabolic panel     Status: Abnormal   Collection Time: 04/21/22  3:03 AM  Result Value Ref Range   Sodium 134 (L) 135 - 145 mmol/L   Potassium 3.3 (L) 3.5 - 5.1 mmol/L   Chloride 103 98 - 111 mmol/L  CO2 21 (L) 22 - 32 mmol/L   Glucose, Bld 140 (H) 70 - 99 mg/dL    Comment: Glucose reference range applies only to samples taken after fasting for at least 8 hours.   BUN 16 8 - 23 mg/dL   Creatinine, Ser 1.13 0.61 - 1.24 mg/dL   Calcium 9.1 8.9 - 10.3 mg/dL   Total Protein 4.8 (L) 6.5 - 8.1 g/dL   Albumin  3.3 (L) 3.5 - 5.0 g/dL   AST 30 15 - 41 U/L   ALT 28 0 - 44 U/L   Alkaline Phosphatase 46 38 - 126 U/L   Total Bilirubin 4.2 (H) 0.3 - 1.2 mg/dL   GFR, Estimated >60 >60 mL/min    Comment: (NOTE) Calculated using the CKD-EPI Creatinine Equation (2021)    Anion gap 10 5 - 15    Comment: Performed at [facility], [place] [address], [place], [place] [address]  CBC with Differential/Platelet     Status: Abnormal   Collection Time: 04/21/22  3:03 AM  Result Value Ref Range   WBC 4.5 4.0 - 10.5 K/uL   RBC 2.78 (L) 4.22 - 5.81 MIL/uL   Hemoglobin 9.7 (L) 13.0 - 17.0 g/dL   HCT 28.8 (L) 39.0 - 52.0 %   MCV 103.6 (H) 80.0 - 100.0 fL   MCH 34.9 (H) 26.0 - 34.0 pg   MCHC 33.7 30.0 - 36.0 g/dL   RDW 15.3 11.5 - 15.5 %   Platelets 63 (L) 150 - 400 K/uL    Comment: SPECIMEN CHECKED FOR CLOTS Immature Platelet Fraction may be clinically indicated, consider ordering this additional test [id number] CONSISTENT WITH PREVIOUS RESULT    nRBC 0.0 0.0 - 0.2 %   Neutrophils Relative % 67 %   Neutro Abs 3.1 1.7 - 7.7 K/uL   Lymphocytes Relative 10 %   Lymphs Abs 0.5 (L) 0.7 - 4.0 K/uL   Monocytes Relative 13 %   Monocytes Absolute 0.6 0.1 - 1.0 K/uL   Eosinophils Relative 9 %   Eosinophils Absolute 0.4 0.0 - 0.5 K/uL   Basophils Relative 1 %   Basophils Absolute 0.0 0.0 - 0.1 K/uL   Immature Granulocytes 0 %   Abs Immature Granulocytes 0.02 0.00 - 0.07 K/uL    Comment: Performed at [facility], [place] [address], [place], [place] [address]  Glucose, capillary     Status: Abnormal   Collection Time: 04/21/22  7:21 AM  Result Value Ref Range   Glucose-Capillary 107 (H) 70 - 99 mg/dL    Comment: Glucose reference range applies only to samples taken after fasting for at least 8 hours.    Studies/Results: CT Angio Abd/Pel w/ and/or w/o  Result Date: 04/20/2022 CLINICAL DATA:  Preop planning for TIPS EXAM: CTA ABDOMEN AND PELVIS WITHOUT AND WITH CONTRAST  TECHNIQUE: Multidetector CT imaging of the abdomen and pelvis was performed using the standard protocol during bolus administration of intravenous contrast. Multiplanar reconstructed images and MIPs were obtained and reviewed to evaluate the vascular anatomy. RADIATION DOSE REDUCTION: This exam was performed according to the departmental dose-optimization program which includes automated exposure control, adjustment of the mA and/or kV according to patient size and/or use of iterative reconstruction technique. CONTRAST:  126m OMNIPAQUE IOHEXOL 350 MG/ML SOLN COMPARISON:  06/01/2021 FINDINGS: VASCULAR Coronary calcifications. Patent hepatic veins, portal vein, SM V splenic vein Aorta: Mild scattered calcified atheromatous plaque. No aneurysm, dissection, or stenosis. Celiac: Patent without evidence of aneurysm, dissection, vasculitis or  significant stenosis. SMA: Patent without evidence of aneurysm, dissection, vasculitis or significant stenosis. Replaced right hepatic arterial supply, an anatomic variant. Renals: Duplicated left, inferior dominant, both patent. Single right, widely patent. IMA: Patent without evidence of aneurysm, dissection, vasculitis or significant stenosis. Inflow: Mild nonocclusive calcified plaque. No aneurysm or dissection. Proximal Outflow: Patent, with extensive medial calcifications Veins: Patent hepatic veins, portal vein, splenic vein, SMV. Enlarged umbilical venous collaterals. Enlarged left mesenteric portosystemic collateral. No significant splenorenal shunt. No evident gastric varices. A few small submucosal esophageal varices. Review of the MIP images confirms the above findings. NON-VASCULAR Lower chest: Small left and moderate right pleural effusions. Dependent atelectasis posteriorly in the lung bases. Subpleural fibrotic changes in the visualized lung bases. Hepatobiliary: Small liver with a nodular contour consistent with cirrhosis. Few scattered stable hepatic cysts, largest  1.3 cm in segment 4. No new liver lesion or biliary ductal dilatation. Cholecystectomy clips. Pancreas: Unremarkable. No pancreatic ductal dilatation or surrounding inflammatory changes. Spleen: Splenomegaly without focal lesion. Adrenals/Urinary Tract: No adrenal mass. Symmetric renal parenchymal enhancement without focal lesion or hydronephrosis. Urinary bladder is physiologically distended. Stomach/Bowel: Partially distended by ingested material, unremarkable. Duodenal diverticulum. Small bowel is nondilated. Appendix not discretely identified. The colon is partially distended with fecal material. Multiple diverticula from descending and sigmoid segments. Lymphatic: No abdominal or pelvic adenopathy. Reproductive: Prostate is unremarkable. Other: Moderate pelvic ascites.  No free air. Musculoskeletal: Chronic fracture deformity of left iliac wing with herniation of retroperitoneal fat, ascites, and venous collaterals into the deep subcutaneous tissues. Spondylitic changes in the lower thoracic and lumbar spine. No acute findings. IMPRESSION: 1. No acute findings. 2. Patent hepatic and portal veins. 3. Umbilical and left mesenteric portosystemic venous collaterals. No significant splenorenal shunt. 4. Moderate ascites, bilateral pleural effusions right greater than left. 5. Descending and sigmoid diverticulosis. Electronically Signed   By: Lucrezia Europe M.D.   On: 04/20/2022 17:06   US Paracentesis  Result Date: 04/19/2022 INDICATION: History of cirrhosis with recurrent ascites. Patient was scheduled for elective tips on 03/17/2022. The procedure was aborted due to elevated right heart pressure right heart pressure remained elevated even after right thoracentesis and paracentesis was performed, procedure was aborted. Request for diagnostic paracentesis. EXAM: ULTRASOUND GUIDED DIAGNOSTIC  RIGHT LOWER QUADRANT PARACENTESIS MEDICATIONS: 10 mL 1 % lidocaine COMPLICATIONS: None immediate. PROCEDURE: Informed written  consent was obtained from the patient after a discussion of the risks, benefits and alternatives to treatment. A timeout was performed prior to the initiation of the procedure. Initial ultrasound scanning demonstrates a large amount of ascites within the right lower abdominal quadrant. The right lower abdomen was prepped and draped in the usual sterile fashion. 1% lidocaine was used for local anesthesia. Following this, a 19 gauge, 7-cm, Yueh catheter was introduced. An ultrasound image was saved for documentation purposes. The paracentesis was performed. The catheter was removed and a dressing was applied. The patient tolerated the procedure well without immediate post procedural complication. Patient received post-procedure intravenous albumin; see nursing notes for details. FINDINGS: A total of approximately 3 L of clear, golden colored fluid was removed. Samples were sent to the laboratory as requested by the clinical team. IMPRESSION: Successful ultrasound-guided paracentesis yielding 3 liters of peritoneal fluid. Read by: Narda Rutherford, AGNP-BC PLAN: The patient has previously been evaluated by the [facility], and deemed not a candidate for intervention. Electronically Signed   By: Jacqulynn Cadet M.D.   On: 04/19/2022 13:28   ECHOCARDIOGRAM COMPLETE  Result  Date: 04/19/2022    ECHOCARDIOGRAM REPORT   Patient Name:   Peter Nichols Date of Exam: 04/19/2022 Medical Rec #:  [id number]       Height:       69.0 in Accession #:    [id number]      Weight:       161.6 lb Date of Birth:  1946/01/31        BSA:          1.887 m Patient Age:    39 years        BP:           135/54 mmHg Patient Gender: M               HR:           89 bpm. Exam Location:  Inpatient Procedure: 2D Echo, Cardiac Doppler and Color Doppler Indications:    R06.02 SOB  History:        Patient has prior history of Echocardiogram examinations, most                 recent 03/06/2022. Risk  Factors:Hypertension and Dyslipidemia.                 ETOH.  Sonographer:    Roseanna Rainbow RDCS Referring Phys: [id number] Hortencia Conradi [facility]  Sonographer Comments: Technically difficult study due to poor echo windows. Image acquisition challenging due to respiratory motion. Diffucult windows, patient could not raise left arm adaquately. Attempted to turn IMPRESSIONS  1. Left ventricular ejection fraction, by estimation, is 60 to 65%. The left ventricle has normal function. The left ventricle has no regional wall motion abnormalities. Left ventricular diastolic parameters were normal.  2. Right ventricular systolic function is normal. The right ventricular size is normal.  3. Left atrial size was mildly dilated.  4. The mitral valve is normal in structure. No evidence of mitral valve regurgitation. No evidence of mitral stenosis.  5. The aortic valve was not well visualized. There is mild calcification of the aortic valve. There is mild thickening of the aortic valve. Aortic valve regurgitation is not visualized. Aortic valve sclerosis is present, with no evidence of aortic valve  stenosis.  6. The inferior vena cava is dilated in size with >50% respiratory variability, suggesting right atrial pressure of 8 mmHg. FINDINGS  Left Ventricle: Left ventricular ejection fraction, by estimation, is 60 to 65%. The left ventricle has normal function. The left ventricle has no regional wall motion abnormalities. The left ventricular internal cavity size was normal in size. There is  no left ventricular hypertrophy. Left ventricular diastolic parameters were normal. Right Ventricle: The right ventricular size is normal. No increase in right ventricular wall thickness. Right ventricular systolic function is normal. Left Atrium: Left atrial size was mildly dilated. Right Atrium: Right atrial size was normal in size. Pericardium: There is no evidence of pericardial effusion. Mitral Valve: The mitral valve is normal in structure. No  evidence of mitral valve regurgitation. No evidence of mitral valve stenosis. Tricuspid Valve: The tricuspid valve is normal in structure. Tricuspid valve regurgitation is mild . No evidence of tricuspid stenosis. Aortic Valve: The aortic valve was not well visualized. There is mild calcification of the aortic valve. There is mild thickening of the aortic valve. Aortic valve regurgitation is not visualized. Aortic valve sclerosis is present, with no evidence of aortic valve stenosis. Pulmonic Valve: The pulmonic valve was normal in structure. Pulmonic valve regurgitation is not visualized. No  evidence of pulmonic stenosis. Aorta: The aortic root is normal in size and structure. Venous: The inferior vena cava is dilated in size with greater than 50% respiratory variability, suggesting right atrial pressure of 8 mmHg. IAS/Shunts: No atrial level shunt detected by color flow Doppler.  LEFT VENTRICLE PLAX 2D LVIDd:         4.70 cm     Diastology LVIDs:         2.70 cm     LV e' medial:    9.90 cm/s LV PW:         1.00 cm     LV E/e' medial:  7.6 LV IVS:        0.90 cm     LV e' lateral:   7.94 cm/s LVOT diam:     2.40 cm     LV E/e' lateral: 9.5 LV SV:         106 LV SV Index:   56 LVOT Area:     4.52 cm  LV Volumes (MOD) LV vol d, MOD A2C: 64.0 ml LV vol d, MOD A4C: 92.1 ml LV vol s, MOD A2C: 15.6 ml LV vol s, MOD A4C: 20.3 ml LV SV MOD A2C:     48.4 ml LV SV MOD A4C:     92.1 ml LV SV MOD BP:      58.9 ml RIGHT VENTRICLE             IVC RV S prime:     28.90 cm/s  IVC diam: 2.30 cm TAPSE (M-mode): 2.7 cm LEFT ATRIUM             Index        RIGHT ATRIUM           Index LA diam:        4.10 cm 2.17 cm/m   RA Area:     11.80 cm LA Vol (A2C):   43.0 ml 22.79 ml/m  RA Volume:   23.10 ml  12.24 ml/m LA Vol (A4C):   39.8 ml 21.09 ml/m LA Biplane Vol: 40.9 ml 21.67 ml/m  AORTIC VALVE LVOT Vmax:   117.00 cm/s LVOT Vmean:  77.700 cm/s LVOT VTI:    0.234 m  AORTA Ao Root diam: 3.70 cm Ao Asc diam:  3.20 cm MITRAL VALVE  MV Area (PHT): 3.88 cm    SHUNTS MV Decel Time: 196 msec    Systemic VTI:  0.23 m MV E velocity: 75.50 cm/s  Systemic Diam: 2.40 cm MV A velocity: 95.05 cm/s MV E/A ratio:  0.79 Jenkins Rouge MD Electronically signed by Jenkins Rouge MD Signature Date/Time: 04/19/2022/11:36:58 AM    Final     Medications: I have reviewed the patient's current medications.  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 bili/AST/ALT/ALP of 4.2/30/28/46 Hemoglobin 9.7, platelets 63 PT 26.6, INR 2.5  Plan: IR evaluation performed yesterday, they are planning CTA abdomen pelvis BRTO protocol for preprocedure mapping and possibly TIPS/ectopic varix embolization on 05/15/2022. CTA showed patent hepatic and portal veins, moderate ascites, bilateral pleural effusions, right greater than left  Continue IV ceftriaxone Patient's blood pressure is stable with IV albumin, remains off IV pressors. Continue lactulose 30 g 3 times a day and Xifaxan 550 mg twice a day for history of hepatic encephalopathy. Continue pantoprazole 40  mg twice a day for now Guarded prognosis  Ronnette Juniper, MD 04/21/2022, 9:18 AM

## 2022-04-21 NOTE — TOC Initial Note (Signed)
Transition of Care [facility]) - Initial/Assessment Note    Patient Details  Name: Peter Nichols MRN: [id number] Date of Birth: 04/15/1946  [facility] CM/SW Contact:    Leeroy Cha, RN Phone Number: 04/21/2022, 7:38 AM  Clinical Narrative:                 Possible transfer to [facility] for liver transplant on 05/16/2022. Hx of etoh cirrhosis. From home with wife.  Has had multiple paracenthesis in the past.  Expected Discharge Plan: Acute to Acute Transfer Barriers to Discharge: Continued Medical Work up   Patient Goals and CMS Choice Patient states their goals for this hospitalization and ongoing recovery are:: for transplant at [facility] on 05/18/2022 CMS Medicare.gov Compare Post Acute Care list provided to:: Patient Choice offered to / list presented to : Patient  Expected Discharge Plan and Services Expected Discharge Plan: Acute to Acute Transfer   Discharge Planning Services: CM Consult   Living arrangements for the past 2 months: Single Family Home                                      Prior Living Arrangements/Services Living arrangements for the past 2 months: Single Family Home Lives with:: Spouse Patient language and need for interpreter reviewed:: Yes Do you feel safe going back to the place where you live?: Yes            Criminal Activity/Legal Involvement Pertinent to Current Situation/Hospitalization: No - Comment as needed  Activities of Daily Living      Permission Sought/Granted                  Emotional Assessment Appearance:: Appears stated age     Orientation: : Oriented to Self, Oriented to Place, Oriented to  Time, Oriented to Situation Alcohol / Substance Use: Alcohol Use Psych Involvement: No (comment)  Admission diagnosis:  Hepatic encephalopathy ([place]) [K76.82] Dehydration [E86.0] Lactic acidosis [E87.20] Weakness [R53.1] Thrombocytopenia ([facility]) [D69.6] Superficial vein thrombosis [S56.812] Alcoholic cirrhosis of liver  with ascites ([place]) [K70.31] Decompensated hepatic cirrhosis ([place]) [K72.90, K74.60] Pressure injury of sacral region, stage 2 ([place]) [L89.152] Patient Active Problem List   Diagnosis Date Noted   Lactic acidosis 04/20/2022   Generalized weakness 05/15/2022   Decompensated hepatic cirrhosis ([place])    GERD (gastroesophageal reflux disease)    Ascites of liver 01/01/2022   Anemia, unspecified 01/01/2022   Hyperglycemia 01/01/2022   Hepatic encephalopathy ([place]) 12/31/2021   Pancreatitis, acute 06/01/2021   AKI (acute kidney injury) ([place]) 05/27/2021   Near syncope 05/27/2021   Steatohepatitis, non-alcoholic    Hyperlipidemia    Hypertension    Overweight (BMI 25.0-29.9)    Alcoholic cirrhosis of liver with ascites ([place])    Acute hepatic encephalopathy ([place]) 05/22/2021   Thrombocytopenia ([place]) 05/04/2017   PCP:  [name], MD Pharmacy:   [id number] [place] [id number]: [phone]: [phone]    Social Determinants of Health (SDOH) Interventions    Readmission Risk Interventions     No data to display

## 2022-04-21 NOTE — Progress Notes (Signed)
PROGRESS NOTE    Peter Nichols  TIW:[id number] DOB: 1946-03-23 DOA: 04/21/2022 PCP: [name], MD   Brief Narrative: Peter Nichols is a 76 y.o. male alcoholic cirrhosis of the liver, recurrent ascites, portal hypertension, history of hepatic encephalopathy,  hepatic hydrothorax, large left retroperitoneal varix, chronic thrombocytopenia, he recently underwent ultrasound-guided thoracentesis  yielding 800 cc amber fluids on 6/28 prior to that he had paracentesis 6/23 yielding 4.5 L, his diuretics has been recently increased, he presents with generalized weakness since the morning of admission. He had another paracentesis 6/30 per family yielding 4 L.  His diuretics lasix and spironolactone were increase recently.  He has been so weak, he was not able to goes down stair.  Wife notice some confusion.  He only had one BM the day prior to admission.   Patient denies abdominal pain.    Of note he was evaluated at [facility], not candidate for transplant due to age and co morbidities, but was advised to follow up at [facility]. He was refer to [facility] for second opinion and evaluation of living donor transplant.    Evaluation in the ED: Sodium 133, potassium 3.7, BUN 23, creatinine 1.3, albumin 3.4, AST 49, ALT 42, ammonia and 119, total bilirubin 10.4, BNP 81, lactic acid 3.1---4.3.  Hemoglobin 11, platelets 85. Chest x-ray: Negative   Presented with lactic acidosis, became hypotensive overnight. He has received IV fluids and albumin. CCM consulted. He was transiently on  levophed. CCM recommended Therapeutic thoracentesis. CCM has sign off. If MAP consistently below 55, need to check lactic acid and bmet, and might need iv pressors.    Assessment & Plan:   Principal Problem:   Lactic acidosis Active Problems:   Decompensated hepatic cirrhosis (HCC)   GERD (gastroesophageal reflux disease)   Generalized weakness  1-Generalized weakness;  Suspect related to hypovolemia, from recent paracentesis,  thoracentesis, increase diuretics and decompensated cirrhosis -Plan to monitor for hypoglycemia.  -Received IV fluids.  -Hold diuretics.  -PT consulted.   2-Lactic acidosis// Hypotension. ; Could be related to hypovolemia, hypoperfusion. Also could be related to his liver cirrhosis.  He has received Albumin and IV fluids.  MAP 56. CCM consulted. Received albumin, transient on levophed.  CCM recommend to proceed with Diagnostic paracentesis.  Had paracentesis. Cell count 120, Neutrophil count 8. Culture no growth.  Continue with IV Ceftriaxone.  Per CCM if Map consistently below 55, need to check lactic acid and bmet, and might need CCM consultation.  BP has remain stable.   3-Decompensated Cirrhosis of Liver;  Continue with  lactulose.  GI Consulted. Plan to hold diuretics. (At time of resumption of Diuretics plan to resume previous dose before they raise it ) Meld score 24. 19 % estimated 3 month mortality  Continue with IV ceftriaxone.  Monitor for GI bleed.  Hold BB.  3 L removed during paracentesis. CCM  ordered  Albumin.  IR consulted for evaluation for TIPS procedure. CTA showed: patent hepatic and portal veins.  IR to discussed with patient and family about procedure.   4-Acute metabolic encephalopathy; he has mild confusion.  Resume lactulose, per GI resume prior home dose 30 gr TID>  Continue with  Rifaximin.  Only had one BM yesterday. Plan to give an extra dose of lactulose.   superficial vein thrombosis involving the left small sahenous vein.  Monitor. Would avoid NSAID>  Needs follow up doppler.    Urine retention;  He had I and O.  Monitor.   Hypokalemia; Replaced.  Estimated body mass index is 23.86 kg/m as calculated from the following:   Height as of this encounter: 5' 9"  (1.753 m).   Weight as of this encounter: 73.3 kg.   DVT prophylaxis: SCD Code Status: Full code Family Communication: Care discussed with patient and wife at bedside.  Disposition  Plan:  Status is: Observation The patient remains OBS appropriate and will d/c before 2 midnights.    Consultants:  CCM GI  Procedures:  Paracentesis.   Antimicrobials:    Subjective:  Per wife patient is not confuse today. He is feeling better.  Only had one BM yesterday,.   Objective: Vitals:   04/20/22 2300 04/21/22 0000 04/21/22 0100 04/21/22 0200  BP: (!) 123/49 (!) 97/57 (!) 118/39 (!) 105/43  Pulse: (!) 102 91 80 (!) 48  Resp: 20 (!) 22 19 (!) 22  Temp:  98.8 F (37.1 C)    TempSrc:  Oral    SpO2: 95% 95% 95% 94%  Weight:      Height:        Intake/Output Summary (Last 24 hours) at 04/21/2022 0647 Last data filed at 04/21/2022 0000 Gross per 24 hour  Intake 486.64 ml  Output 300 ml  Net 186.64 ml    Filed Weights   05/10/2022 1411 05/02/2022 2138  Weight: 71.2 kg 73.3 kg    Examination:  General exam: NAD, Icteric  Respiratory system: CTA Cardiovascular system: S 1, S 2 RRR Gastrointestinal system: Abdomen distended, soft, nt Central nervous system: Alert, follows command  Extremities: trace edema  Data Reviewed: I have personally reviewed following labs and imaging studies  CBC: Recent Labs  Lab 04/26/2022 1410 04/19/22 0258 04/20/22 0257 04/21/22 0303  WBC 7.0 4.7 5.8 4.5  NEUTROABS  --   --  4.6 3.1  HGB 11.1* 10.0* 9.5* 9.7*  HCT 32.4* 28.6* 27.5* 28.8*  MCV 103.2* 101.1* 101.5* 103.6*  PLT 85* 75* 67* 63*    Basic Metabolic Panel: Recent Labs  Lab 05/15/2022 1422 04/28/2022 1755 04/19/22 0258 04/20/22 0257 04/21/22 0303  NA 133*  --  133* 134* 134*  K 3.7  --  3.1* 3.4* 3.3*  CL 98  --  100 103 103  CO2 20*  --  23 23 21*  GLUCOSE 115*  --  134* 112* 140*  BUN 23  --  23 18 16   CREATININE 1.32*  --  1.21 1.13 1.13  CALCIUM 9.8  --  9.4 9.2 9.1  MG  --  2.0  --   --   --     GFR: Estimated Creatinine Clearance: 56.5 mL/min (by C-G formula based on SCr of 1.13 mg/dL). Liver Function Tests: Recent Labs  Lab 05/09/2022 1422  04/19/22 0258 04/20/22 0257 04/21/22 0303  AST 49* 40 33 30  ALT 42 38 33 28  ALKPHOS 56 52 41 46  BILITOT 10.4* 7.5* 5.8* 4.2*  PROT 5.5* 5.1* 5.1* 4.8*  ALBUMIN 3.4* 3.1* 3.5 3.3*    No results for input(s): "LIPASE", "AMYLASE" in the last 168 hours. Recent Labs  Lab 04/25/2022 1419 04/19/22 0747  AMMONIA 119* 96*    Coagulation Profile: Recent Labs  Lab 05/01/2022 1755 04/19/22 0258 04/20/22 0812  INR 2.1* 2.1* 2.5*    Cardiac Enzymes: No results for input(s): "CKTOTAL", "CKMB", "CKMBINDEX", "TROPONINI" in the last 168 hours. BNP (last 3 results) No results for input(s): "PROBNP" in the last 8760 hours. HbA1C: No results for input(s): "HGBA1C" in the last 72 hours. CBG: Recent Labs  Lab 04/20/22 1142 04/20/22 1632 04/20/22 1941 04/20/22 2355 04/21/22 0302  GLUCAP 187* 128* 167* 133* 144*    Lipid Profile: No results for input(s): "CHOL", "HDL", "LDLCALC", "TRIG", "CHOLHDL", "LDLDIRECT" in the last 72 hours. Thyroid Function Tests: No results for input(s): "TSH", "T4TOTAL", "FREET4", "T3FREE", "THYROIDAB" in the last 72 hours. Anemia Panel: No results for input(s): "VITAMINB12", "FOLATE", "FERRITIN", "TIBC", "IRON", "RETICCTPCT" in the last 72 hours. Sepsis Labs: Recent Labs  Lab 05/14/2022 1603 04/28/2022 1930 05/03/2022 2229 04/19/22 0747 04/20/22 0257 04/21/22 0303  PROCALCITON  --   --   --  0.18 0.18 0.17  LATICACIDVEN 4.3* 4.3* 4.0* 2.6*  --   --      Recent Results (from the past 240 hour(s))  MRSA Next Gen by PCR, Nasal     Status: None   Collection Time: 05/02/2022  9:56 PM   Specimen: Nasal Mucosa; Nasal Swab  Result Value Ref Range Status   MRSA by PCR Next Gen NOT DETECTED NOT DETECTED Final    Comment: (NOTE) The GeneXpert MRSA Assay (FDA approved for NASAL specimens only), is one component of a comprehensive MRSA colonization surveillance program. It is not intended to diagnose MRSA infection nor to guide or monitor treatment for MRSA  infections. Test performance is not FDA approved in patients less than 5 years old. Performed at [facility], [place] [address] [place], [place] [address]   Urine Culture     Status: None   Collection Time: 04/19/22  4:05 AM   Specimen: Urine, Clean Catch  Result Value Ref Range Status   Specimen Description   Final    URINE, CLEAN CATCH Performed at [facility], [place] [address] [place], [place] [address]    Special Requests   Final    NONE Performed at [facility], [place] [address] [place], [place] [address]    Culture   Final    NO GROWTH Performed at [facility], [place] [address], [place], [place] [address]    Report Status 04/20/2022 FINAL  Final  Culture, blood (Routine X 2) w Reflex to ID Panel     Status: None (Preliminary result)   Collection Time: 04/19/22  7:47 AM   Specimen: BLOOD RIGHT FOREARM  Result Value Ref Range Status   Specimen Description   Final    BLOOD RIGHT FOREARM Performed at [place] [address] [place], [place] [address]    Special Requests   Final    BOTTLES DRAWN AEROBIC ONLY Blood Culture results may not be optimal due to an inadequate volume of blood received in culture bottles Performed at [facility] [address], [place], [place] [address]    Culture   Final    NO GROWTH 2 DAYS Performed at [facility] [address] [place], [place] [address]    Report Status PENDING  Incomplete  Culture, blood (Routine X 2) w Reflex to ID Panel     Status: None (Preliminary result)   Collection Time: 04/19/22  7:47 AM   Specimen: BLOOD RIGHT HAND  Result Value Ref Range Status   Specimen Description   Final    BLOOD RIGHT HAND Performed at [facility] [place] [address] [place], [place] [address]    Special Requests   Final    IN PEDIATRIC BOTTLE Blood Culture adequate volume Performed at [place] [address], [place], [place] [address]  Culture   Final    NO GROWTH 2 DAYS Performed at [facility], [place] [address], [place], [place] [address]    Report Status PENDING  Incomplete  Gram stain     Status: None   Collection Time: 04/19/22  1:45 PM   Specimen: PATH Cytology Peritoneal fluid  Result Value Ref Range Status   Specimen Description   Final    FLUID Performed at [facility] [address] [place], [place] [address]    Special Requests   Final    NONE Performed at [facility], [place] [address] [place], [place] [address]    Gram Stain   Final    NO SQUAMOUS EPITHELIAL CELLS SEEN RARE WBC SEEN NO ORGANISMS SEEN Performed at [facility], [place] [address] [place], [place] [address]    Report Status 04/20/2022 FINAL  Final  Body fluid culture w Gram Stain     Status: None (Preliminary result)   Collection Time: 04/19/22  1:45 PM   Specimen: PATH Cytology Peritoneal fluid  Result Value Ref Range Status   Specimen Description   Final    FLUID Performed at [facility] [address], [place], [place] [address]    Special Requests   Final    NONE Performed at [facility], [place] [address], [place], [place] [address]    Gram Stain   Final    NO SQUAMOUS EPITHELIAL CELLS SEEN FEW WBC SEEN NO ORGANISMS SEEN    Culture   Final    NO GROWTH < 12 HOURS Performed at [facility], [place] [address], [place], [place] [address]    Report Status PENDING  Incomplete         Radiology Studies: CT Angio Abd/Pel w/ and/or w/o  Result Date: 04/20/2022 CLINICAL DATA:  Preop planning for TIPS EXAM: CTA ABDOMEN AND PELVIS WITHOUT AND WITH CONTRAST TECHNIQUE: Multidetector CT imaging of the abdomen and pelvis was performed using the standard protocol during bolus administration of intravenous contrast. Multiplanar reconstructed images and MIPs were obtained and reviewed  to evaluate the vascular anatomy. RADIATION DOSE REDUCTION: This exam was performed according to the departmental dose-optimization program which includes automated exposure control, adjustment of the mA and/or kV according to patient size and/or use of iterative reconstruction technique. CONTRAST:  159m OMNIPAQUE IOHEXOL 350 MG/ML SOLN COMPARISON:  06/01/2021 FINDINGS: VASCULAR Coronary calcifications. Patent hepatic veins, portal vein, SM V splenic vein Aorta: Mild scattered calcified atheromatous plaque. No aneurysm, dissection, or stenosis. Celiac: Patent without evidence of aneurysm, dissection, vasculitis or significant stenosis. SMA: Patent without evidence of aneurysm, dissection, vasculitis or significant stenosis. Replaced right hepatic arterial supply, an anatomic variant. Renals: Duplicated left, inferior dominant, both patent. Single right, widely patent. IMA: Patent without evidence of aneurysm, dissection, vasculitis or significant stenosis. Inflow: Mild nonocclusive calcified plaque. No aneurysm or dissection. Proximal Outflow: Patent, with extensive medial calcifications Veins: Patent hepatic veins, portal vein, splenic vein, SMV. Enlarged umbilical venous collaterals. Enlarged left mesenteric portosystemic collateral. No significant splenorenal shunt. No evident gastric varices. A few small submucosal esophageal varices. Review of the MIP images confirms the above findings. NON-VASCULAR Lower chest: Small left and moderate right pleural effusions. Dependent atelectasis posteriorly in the lung bases. Subpleural fibrotic changes in the visualized lung bases. Hepatobiliary: Small liver with a nodular contour consistent with cirrhosis. Few scattered stable hepatic cysts, largest 1.3 cm in segment 4. No new liver lesion or biliary  ductal dilatation. Cholecystectomy clips. Pancreas: Unremarkable. No pancreatic ductal dilatation or surrounding inflammatory changes. Spleen: Splenomegaly without focal  lesion. Adrenals/Urinary Tract: No adrenal mass. Symmetric renal parenchymal enhancement without focal lesion or hydronephrosis. Urinary bladder is physiologically distended. Stomach/Bowel: Partially distended by ingested material, unremarkable. Duodenal diverticulum. Small bowel is nondilated. Appendix not discretely identified. The colon is partially distended with fecal material. Multiple diverticula from descending and sigmoid segments. Lymphatic: No abdominal or pelvic adenopathy. Reproductive: Prostate is unremarkable. Other: Moderate pelvic ascites.  No free air. Musculoskeletal: Chronic fracture deformity of left iliac wing with herniation of retroperitoneal fat, ascites, and venous collaterals into the deep subcutaneous tissues. Spondylitic changes in the lower thoracic and lumbar spine. No acute findings. IMPRESSION: 1. No acute findings. 2. Patent hepatic and portal veins. 3. Umbilical and left mesenteric portosystemic venous collaterals. No significant splenorenal shunt. 4. Moderate ascites, bilateral pleural effusions right greater than left. 5. Descending and sigmoid diverticulosis. Electronically Signed   By: Lucrezia Europe M.D.   On: 04/20/2022 17:06   US Paracentesis  Result Date: 04/19/2022 INDICATION: History of cirrhosis with recurrent ascites. Patient was scheduled for elective tips on 03/17/2022. The procedure was aborted due to elevated right heart pressure right heart pressure remained elevated even after right thoracentesis and paracentesis was performed, procedure was aborted. Request for diagnostic paracentesis. EXAM: ULTRASOUND GUIDED DIAGNOSTIC  RIGHT LOWER QUADRANT PARACENTESIS MEDICATIONS: 10 mL 1 % lidocaine COMPLICATIONS: None immediate. PROCEDURE: Informed written consent was obtained from the patient after a discussion of the risks, benefits and alternatives to treatment. A timeout was performed prior to the initiation of the procedure. Initial ultrasound scanning demonstrates a  large amount of ascites within the right lower abdominal quadrant. The right lower abdomen was prepped and draped in the usual sterile fashion. 1% lidocaine was used for local anesthesia. Following this, a 19 gauge, 7-cm, Yueh catheter was introduced. An ultrasound image was saved for documentation purposes. The paracentesis was performed. The catheter was removed and a dressing was applied. The patient tolerated the procedure well without immediate post procedural complication. Patient received post-procedure intravenous albumin; see nursing notes for details. FINDINGS: A total of approximately 3 L of clear, golden colored fluid was removed. Samples were sent to the laboratory as requested by the clinical team. IMPRESSION: Successful ultrasound-guided paracentesis yielding 3 liters of peritoneal fluid. Read by: Narda Rutherford, AGNP-BC PLAN: The patient has previously been evaluated by the [facility], and deemed not a candidate for intervention. Electronically Signed   By: Jacqulynn Cadet M.D.   On: 04/19/2022 13:28   ECHOCARDIOGRAM COMPLETE  Result Date: 04/19/2022    ECHOCARDIOGRAM REPORT   Patient Name:   JAS BETTEN Date of Exam: 04/19/2022 Medical Rec #:  [id number]       Height:       69.0 in Accession #:    [id number]      Weight:       161.6 lb Date of Birth:  01-15-1946        BSA:          1.887 m Patient Age:    51 years        BP:           135/54 mmHg Patient Gender: M               HR:           89 bpm. Exam Location:  Inpatient Procedure: 2D Echo, Cardiac Doppler and Color  Doppler Indications:    R06.02 SOB  History:        Patient has prior history of Echocardiogram examinations, most                 recent 03/06/2022. Risk Factors:Hypertension and Dyslipidemia.                 ETOH.  Sonographer:    Roseanna Rainbow RDCS Referring Phys: [id number] Hortencia Conradi [facility]  Sonographer Comments: Technically difficult study due to poor echo windows. Image  acquisition challenging due to respiratory motion. Diffucult windows, patient could not raise left arm adaquately. Attempted to turn IMPRESSIONS  1. Left ventricular ejection fraction, by estimation, is 60 to 65%. The left ventricle has normal function. The left ventricle has no regional wall motion abnormalities. Left ventricular diastolic parameters were normal.  2. Right ventricular systolic function is normal. The right ventricular size is normal.  3. Left atrial size was mildly dilated.  4. The mitral valve is normal in structure. No evidence of mitral valve regurgitation. No evidence of mitral stenosis.  5. The aortic valve was not well visualized. There is mild calcification of the aortic valve. There is mild thickening of the aortic valve. Aortic valve regurgitation is not visualized. Aortic valve sclerosis is present, with no evidence of aortic valve  stenosis.  6. The inferior vena cava is dilated in size with >50% respiratory variability, suggesting right atrial pressure of 8 mmHg. FINDINGS  Left Ventricle: Left ventricular ejection fraction, by estimation, is 60 to 65%. The left ventricle has normal function. The left ventricle has no regional wall motion abnormalities. The left ventricular internal cavity size was normal in size. There is  no left ventricular hypertrophy. Left ventricular diastolic parameters were normal. Right Ventricle: The right ventricular size is normal. No increase in right ventricular wall thickness. Right ventricular systolic function is normal. Left Atrium: Left atrial size was mildly dilated. Right Atrium: Right atrial size was normal in size. Pericardium: There is no evidence of pericardial effusion. Mitral Valve: The mitral valve is normal in structure. No evidence of mitral valve regurgitation. No evidence of mitral valve stenosis. Tricuspid Valve: The tricuspid valve is normal in structure. Tricuspid valve regurgitation is mild . No evidence of tricuspid stenosis. Aortic  Valve: The aortic valve was not well visualized. There is mild calcification of the aortic valve. There is mild thickening of the aortic valve. Aortic valve regurgitation is not visualized. Aortic valve sclerosis is present, with no evidence of aortic valve stenosis. Pulmonic Valve: The pulmonic valve was normal in structure. Pulmonic valve regurgitation is not visualized. No evidence of pulmonic stenosis. Aorta: The aortic root is normal in size and structure. Venous: The inferior vena cava is dilated in size with greater than 50% respiratory variability, suggesting right atrial pressure of 8 mmHg. IAS/Shunts: No atrial level shunt detected by color flow Doppler.  LEFT VENTRICLE PLAX 2D LVIDd:         4.70 cm     Diastology LVIDs:         2.70 cm     LV e' medial:    9.90 cm/s LV PW:         1.00 cm     LV E/e' medial:  7.6 LV IVS:        0.90 cm     LV e' lateral:   7.94 cm/s LVOT diam:     2.40 cm     LV E/e' lateral: 9.5 LV SV:  106 LV SV Index:   56 LVOT Area:     4.52 cm  LV Volumes (MOD) LV vol d, MOD A2C: 64.0 ml LV vol d, MOD A4C: 92.1 ml LV vol s, MOD A2C: 15.6 ml LV vol s, MOD A4C: 20.3 ml LV SV MOD A2C:     48.4 ml LV SV MOD A4C:     92.1 ml LV SV MOD BP:      58.9 ml RIGHT VENTRICLE             IVC RV S prime:     28.90 cm/s  IVC diam: 2.30 cm TAPSE (M-mode): 2.7 cm LEFT ATRIUM             Index        RIGHT ATRIUM           Index LA diam:        4.10 cm 2.17 cm/m   RA Area:     11.80 cm LA Vol (A2C):   43.0 ml 22.79 ml/m  RA Volume:   23.10 ml  12.24 ml/m LA Vol (A4C):   39.8 ml 21.09 ml/m LA Biplane Vol: 40.9 ml 21.67 ml/m  AORTIC VALVE LVOT Vmax:   117.00 cm/s LVOT Vmean:  77.700 cm/s LVOT VTI:    0.234 m  AORTA Ao Root diam: 3.70 cm Ao Asc diam:  3.20 cm MITRAL VALVE MV Area (PHT): 3.88 cm    SHUNTS MV Decel Time: 196 msec    Systemic VTI:  0.23 m MV E velocity: 75.50 cm/s  Systemic Diam: 2.40 cm MV A velocity: 95.05 cm/s MV E/A ratio:  0.79 Jenkins Rouge MD Electronically signed by  Jenkins Rouge MD Signature Date/Time: 04/19/2022/11:36:58 AM    Final    DG CHEST PORT 1 VIEW  Result Date: 04/19/2022 CLINICAL DATA:  76 year old male with history of hypotension. Lactic acidosis. EXAM: PORTABLE CHEST 1 VIEW COMPARISON:  Chest x-ray 04/22/2022. FINDINGS: Skin fold artifact projecting over the lower left hemithorax. Lung volumes are low. No consolidative airspace disease. No pleural effusions. No pneumothorax. No pulmonary nodule or mass noted. Pulmonary vasculature and the cardiomediastinal silhouette are within normal limits. IMPRESSION: 1. Low lung volumes without radiographic evidence of acute cardiopulmonary disease. Electronically Signed   By: Vinnie Langton M.D.   On: 04/19/2022 07:28        Scheduled Meds:  Chlorhexidine Gluconate Cloth  6 each Topical Daily   lactulose  30 g Oral TID   pantoprazole (PROTONIX) IV  40 mg Intravenous Q12H   rifaximin  550 mg Oral BID   Continuous Infusions:  sodium chloride 250 mL (04/21/22 0307)   cefTRIAXone (ROCEPHIN)  IV Stopped (04/20/22 1727)   norepinephrine (LEVOPHED) Adult infusion Stopped (04/19/22 1300)     LOS: 2 days    Time spent: 35 minutes.     Elmarie Shiley, MD [facility]   If 7PM-7AM, please contact night-coverage [email or web address]  04/21/2022, 6:47 AM

## 2022-04-22 DIAGNOSIS — E872 Acidosis, unspecified: Secondary | ICD-10-CM | POA: Diagnosis not present

## 2022-04-22 LAB — COMPREHENSIVE METABOLIC PANEL
ALT: 31 U/L (ref 0–44)
AST: 29 U/L (ref 15–41)
Albumin: 3.2 g/dL — ABNORMAL LOW (ref 3.5–5.0)
Alkaline Phosphatase: 52 U/L (ref 38–126)
Anion gap: 8 (ref 5–15)
BUN: 15 mg/dL (ref 8–23)
CO2: 21 mmol/L — ABNORMAL LOW (ref 22–32)
Calcium: 9.2 mg/dL (ref 8.9–10.3)
Chloride: 105 mmol/L (ref 98–111)
Creatinine, Ser: 1.06 mg/dL (ref 0.61–1.24)
GFR, Estimated: >60 mL/min (ref >60)
Glucose, Bld: 137 mg/dL — ABNORMAL HIGH (ref 70–99)
Potassium: 3.3 mmol/L — ABNORMAL LOW (ref 3.5–5.1)
Sodium: 134 mmol/L — ABNORMAL LOW (ref 135–145)
Total Bilirubin: 3.7 mg/dL — ABNORMAL HIGH (ref 0.3–1.2)
Total Protein: 4.8 g/dL — ABNORMAL LOW (ref 6.5–8.1)

## 2022-04-22 LAB — CBC
HCT: 30.2 % — ABNORMAL LOW (ref 39.0–52.0)
Hemoglobin: 10.1 g/dL — ABNORMAL LOW (ref 13.0–17.0)
MCH: 34.5 pg — ABNORMAL HIGH (ref 26.0–34.0)
MCHC: 33.4 g/dL (ref 30.0–36.0)
MCV: 103.1 fL — ABNORMAL HIGH (ref 80.0–100.0)
Platelets: 73 10*3/uL — ABNORMAL LOW (ref 150–400)
RBC: 2.93 MIL/uL — ABNORMAL LOW (ref 4.22–5.81)
RDW: 15.3 % (ref 11.5–15.5)
WBC: 5.6 10*3/uL (ref 4.0–10.5)
nRBC: 0 % (ref 0.0–0.2)

## 2022-04-22 LAB — GLUCOSE, CAPILLARY
Glucose-Capillary: 126 mg/dL — ABNORMAL HIGH (ref 70–99)
Glucose-Capillary: 130 mg/dL — ABNORMAL HIGH (ref 70–99)
Glucose-Capillary: 135 mg/dL — ABNORMAL HIGH (ref 70–99)
Glucose-Capillary: 139 mg/dL — ABNORMAL HIGH (ref 70–99)
Glucose-Capillary: 142 mg/dL — ABNORMAL HIGH (ref 70–99)
Glucose-Capillary: 152 mg/dL — ABNORMAL HIGH (ref 70–99)
Glucose-Capillary: 154 mg/dL — ABNORMAL HIGH (ref 70–99)

## 2022-04-22 MED ORDER — SODIUM CHLORIDE 0.9 % IV BOLUS
250.0000 mL | Freq: Once | INTRAVENOUS | Status: AC
  Administered 2022-04-22: 250 mL via INTRAVENOUS

## 2022-04-22 MED ORDER — POTASSIUM CHLORIDE 20 MEQ PO PACK
40.0000 meq | PACK | Freq: Two times a day (BID) | ORAL | Status: DC
  Administered 2022-04-22 (×2): 40 meq via ORAL
  Filled 2022-04-22 (×2): qty 2

## 2022-04-22 NOTE — Evaluation (Addendum)
Physical Therapy Evaluation Patient Details Name: Peter Nichols MRN: [id number] DOB: 06-28-1946 Today's Date: 04/22/2022  History of Present Illness  Peter Nichols is a 76 y.o. male with a past medical history significant for HTN, chronic thrombocytopenia, GERD, decompensated alcoholic cirrhosis, portal hypertension, hepatic encephalopathy, hepatic hydrothorax, recurrent ascites, large left retroperitoneal varix who presented to [facility] ED on 7/1 with complaints of weakness, confusion, persistent hypotension with diuretic use and required initiation of levophed. Planning for TIPS procedure 7/7.  Clinical Impression  The patient  consents to ambulating in hall. Patient used Rw x 200' with Supervision. Patient  is  independent PTA, resides with wife.Patient has 14 Steps to B/B on second level..Patient has been attending OPPT   BP after ambulation 18/58, HR 97-124 . Patient  has planned procedure  7/6. Pt admitted with above diagnosis.  Pt currently with functional limitations due to the deficits listed below (see PT Problem List). Pt will benefit from skilled PT to increase their independence and safety with mobility to allow discharge to the venue listed below.          Recommendations for follow up therapy are one component of a multi-disciplinary discharge planning process, led by the attending physician.  Recommendations may be updated based on patient status, additional functional criteria and insurance authorization.  Follow Up Recommendations Home health PT      Assistance Recommended at Discharge Set up Supervision/Assistance  Patient can return home with the following  Help with stairs or ramp for entrance;Assist for transportation;Assistance with cooking/housework    Equipment Recommendations  (TBD)  Recommendations for Other Services       Functional Status Assessment Patient has had a recent decline in their functional status and demonstrates the ability to make significant  improvements in function in a reasonable and predictable amount of time.     Precautions / Restrictions Precautions Precaution Comments: BP runs low, on lactulose      Mobility  Bed Mobility Overal bed mobility: Independent                  Transfers Overall transfer level: Needs assistance Equipment used: Rolling walker (2 wheels) Transfers: Sit to/from Stand Sit to Stand: Supervision                Ambulation/Gait Ambulation/Gait assistance: Supervision Gait Distance (Feet): 200 Feet Assistive device: Rolling walker (2 wheels) Gait Pattern/deviations: Step-through pattern       General Gait Details: slow speed  Stairs            Wheelchair Mobility    Modified Rankin (Stroke Patients Only)       Balance Overall balance assessment: Mild deficits observed, not formally tested                                           Pertinent Vitals/Pain Pain Assessment Pain Assessment: No/denies pain    Home Living Family/patient expects to be discharged to:: Private residence Living Arrangements: Spouse/significant other Available Help at Discharge: Family Type of Home: House Home Access: Stairs to enter   Technical brewer of Steps: 1 Alternate Level Stairs-Number of Steps: 14 Home Layout: Two level;1/2 bath on main level;Bed/bath upstairs Home Equipment: None      Prior Function Prior Level of Function : Independent/Modified Independent  Hand Dominance        Extremity/Trunk Assessment   Upper Extremity Assessment Upper Extremity Assessment: Overall WFL for tasks assessed         Cervical / Trunk Assessment Cervical / Trunk Assessment: Kyphotic  Communication      Cognition Arousal/Alertness: Awake/alert Behavior During Therapy: WFL for tasks assessed/performed Overall Cognitive Status: Within Functional Limits for tasks assessed                                           General Comments      Exercises     Assessment/Plan    PT Assessment Patient needs continued PT services  PT Problem List Decreased strength;Decreased mobility;Decreased activity tolerance       PT Treatment Interventions DME instruction;Therapeutic activities;Gait training;Therapeutic exercise;Patient/family education;Functional mobility training;Stair training    PT Goals (Current goals can be found in the Care Plan section)  Acute Rehab PT Goals Patient Stated Goal: go home PT Goal Formulation: With patient/family Time For Goal Achievement: 05/06/22 Potential to Achieve Goals: Good    Frequency Min 3X/week     Co-evaluation               AM-PAC PT "6 Clicks" Mobility  Outcome Measure Help needed turning from your back to your side while in a flat bed without using bedrails?: None Help needed moving from lying on your back to sitting on the side of a flat bed without using bedrails?: None Help needed moving to and from a bed to a chair (including a wheelchair)?: None Help needed standing up from a chair using your arms (e.g., wheelchair or bedside chair)?: None Help needed to walk in hospital room?: A Little Help needed climbing 3-5 steps with a railing? : Total 6 Click Score: 20    End of Session Equipment Utilized During Treatment: Gait belt Activity Tolerance: Patient tolerated treatment well Patient left: in chair;with call bell/phone within reach;with chair alarm set;with family/visitor present Nurse Communication: Mobility status PT Visit Diagnosis: Unsteadiness on feet (R26.81);Muscle weakness (generalized) (M62.81)    Time: 1610-9604 PT Time Calculation (min) (ACUTE ONLY): 27 min   Charges:   PT Evaluation $PT Eval Low Complexity: 1 Low PT Treatments $Gait Training: 8-22 mins        [place] Office [phone] Weekend [phone]   Claretha Cooper 04/22/2022, 1:19 PM

## 2022-04-22 NOTE — Progress Notes (Signed)
Pt diastolic BP low,  map less than 55. NP notified, 250 bolus started per order.

## 2022-04-22 NOTE — Progress Notes (Signed)
[facility] Gastroenterology Progress Note  Peter Nichols 76 y.o. [birth year]   Subjective: Patient seen and examined.  Patient reports he is feeling well today.  Tolerating diet well.  Planning on transfer to [facility] tomorrow for IR procedure and possible TIPS.  ROS : Review of Systems  Gastrointestinal:  Negative for abdominal pain, blood in stool, constipation, diarrhea, heartburn, melena, nausea and vomiting.  Genitourinary:  Negative for dysuria and urgency.    Objective: Vital signs in last 24 hours: Vitals:   04/22/22 0700 04/22/22 0800  BP: (!) 90/52   Pulse: 89   Resp: 20   Temp:  97.9 F (36.6 C)  SpO2: 96%     Physical Exam:  General:  Alert, cooperative, no distress, appears stated age  Head:  Normocephalic, without obvious abnormality, atraumatic  Eyes:  Anicteric sclera, EOM's intact  Lungs:   Clear to auscultation bilaterally, respirations unlabored  Heart:  Regular rate and rhythm, S1, S2 normal  Abdomen:   Soft, non-tender, distended, bowel sounds active all four quadrants,  no masses,   Extremities: Extremities normal, atraumatic,  Pulses: 2+ and symmetric    Lab Results: Recent Labs    04/21/22 0303 04/22/22 0308  NA 134* 134*  K 3.3* 3.3*  CL 103 105  CO2 21* 21*  GLUCOSE 140* 137*  BUN 16 15  CREATININE 1.13 1.06  CALCIUM 9.1 9.2   Recent Labs    04/21/22 0303 04/22/22 0308  AST 30 29  ALT 28 31  ALKPHOS 46 52  BILITOT 4.2* 3.7*  PROT 4.8* 4.8*  ALBUMIN 3.3* 3.2*   Recent Labs    04/20/22 0257 04/21/22 0303 04/22/22 0308  WBC 5.8 4.5 5.6  NEUTROABS 4.6 3.1  --   HGB 9.5* 9.7* 10.1*  HCT 27.5* 28.8* 30.2*  MCV 101.5* 103.6* 103.1*  PLT 67* 63* 73*   Recent Labs    04/20/22 0812 04/21/22 0910  LABPROT 26.6* 25.2*  INR 2.5* 2.3*      Assessment Decompensated liver cirrhosis  HGB 10.1 Platelets 73 AST 29 ALT 31  Alkphos 52 TBili 3.7 GFR >60  INR 04/21/2022 2.3  MELD 3.0: 23 at 04/22/2022  3:08 AM MELD-Na: 24 at  04/22/2022  3:08 AM Calculated from: Serum Creatinine: 1.06 mg/dL at 04/22/2022  3:08 AM Serum Sodium: 134 mmol/L at 04/22/2022  3:08 AM Total Bilirubin: 3.7 mg/dL at 04/22/2022  3:08 AM Serum Albumin: 3.2 g/dL at 04/22/2022  3:08 AM INR(ratio): 2.3 at 04/21/2022  9:10 AM Age at listing (hypothetical): 52 years Sex: Male at 04/22/2022  3:08 AM  Patient was under evaluation with CTA abdomen pelvis BRTO protocol for preprocedure mapping.  Tentative plan for TIPS procedure 7/7, patient will need to be transferred to [place] showed patent hepatic and portal veins, moderate ascites, bilateral pleural effusions  Ascites requiring frequent paracentesis Patient is unable to tolerate diuretics due to hypotension. Paracentesis 04/19/2022, removed 3 L of clear golden fluid.  No SBP  Hepatic hydrothorax requiring thoracentesis Hepatic encephalopathy Patient is AOx3, no asterixis on exam, taking lactulose 30 g 3 times daily and Xifaxan 550 twice daily.  Plan: Continue IV ceftriaxone Continue lactulose 30 g 3 times daily and Xifaxan 550 mg twice daily for hepatic encephalopathy. Continue pantoprazole 40 mg twice daily Diet per interventional radiology. [facility] will follow.  Arvella Nigh Tayllor Breitenstein PA-C 04/22/2022, 11:00 AM  Contact #  [phone]

## 2022-04-22 NOTE — Progress Notes (Signed)
PROGRESS NOTE   Peter Nichols  FHL:[id number] DOB: November 18, 1945 DOA: 05/01/2022 PCP: [name], MD  Brief Narrative:  76 year old white male Known history of recurrent ascites in the setting of cirrhosis complicated 3/73 by underlying hepatic encephalopathy follows with Dr. Stacie Glaze gastroenterology History of hepatic hydrothorax requiring thoracentesis in the past TIPS was attempted 5/30 but aborted secondary to increased right atrial pressure Neuromuscular disorder HTN HLD Pancytopenia with anemia of chronic disease  Had paracentesis 6/22 (4.5 L off), 6/28 (800 cc off), 6/30-albumin given--diuretics increased, lactulose decreased (2/2 diarrhea)-developed worsening encephalopathy 7/2 diagnostic paracentesis 3 L performed-critical care consulted secondary to hypotension 7/0-critical care signed off  Recently went to [facility] turndown for transplant-has second opinion pending at [place]  Presented to [place] [facility] ED with weakness unable to go downstairs and some confusion in the setting of decreased bowel movements and decrease in lactulose  Sodium 133 BUNs/creatinine 23/1.3 AST/ALT 49/42 ammonia 119 total bili 10.4 BNP 81   Hospital-Problem based course  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 Prior hepatic hydrothorax requiring thoracentesis Prior hepatic encephalopathy Status post 3 L paracentesis 04/19/2022 Maintain MAP above 65 Hypotension precludes diuretics (was on Aldactone 150 Lasix 40) and limits large volume paracentesis Dr. Serafina Royals indicates he has been approved for TIPS procedure and this will occur on 05/01/2022 at [facility] and he will also get a thoracentesis Last paracentesis showed cultures with no growth patient continues on IV ceftriaxone Hypotension secondary to liver cirrhosis MELD score 24 with a 19% 50-monthmortality Critical care initially was consulted and they have signed off --he was on Levophed for short period of  time Diuretics have been held His prior to admission propranolol 10 twice daily had to be held Hepatic encephalopathy Improved from admission apparently-continues on rifaximin 550 twice daily, lactulose 30 3 times daily Mild hypokalemia\ Replace orally with potassium 40 twice daily and check a.m. magnesium   DVT prophylaxis: SCD Code Status: Full Family Communication: None present at this time Disposition:  Status is: Inpatient Remains inpatient appropriate because:   Requires further procedures   Consultants:  Gastroenterology  Procedures: Thoracentesis as above  Antimicrobials: Ceftriaxone   Subjective: Awake coherent-tells me he has been approved for the procedure Overall feels quite weak Eating fair No chest pain  Objective: Vitals:   04/22/22 0415 04/22/22 0420 04/22/22 0500 04/22/22 0600  BP:   118/60 (!) 117/29  Pulse: 93 88 92 86  Resp: (!) 24 (!) 24 20 19   Temp:  98.1 F (36.7 C)    TempSrc:  Oral    SpO2: 96% 94% 97% 96%  Weight:      Height:        Intake/Output Summary (Last 24 hours) at 04/22/2022 0658 Last data filed at 04/22/2022 0555 Gross per 24 hour  Intake 865.7 ml  Output 300 ml  Net 565.7 ml   Filed Weights   04/27/2022 1411 05/06/2022 2138  Weight: 71.2 kg 73.3 kg    Examination:  Cachectic appearing white male EOMI NCAT no focal deficit CTA B no added sound rales rhonchi wheeze ROM intact Abdomen distended fluid wave noted No asterixis   Data Reviewed: personally reviewed   CBC    Component Value Date/Time   WBC 5.6 04/22/2022 0308   RBC 2.93 (L) 04/22/2022 0308   HGB 10.1 (L) 04/22/2022 0308   HGB 16.0 05/04/2017 1355   HCT 30.2 (L) 04/22/2022 0308   HCT 45.9 05/04/2017 1355   PLT 73 (L) 04/22/2022 0308  PLT 100 (L) 05/04/2017 1355   MCV 103.1 (H) 04/22/2022 0308   MCV 94.8 05/04/2017 1355   MCH 34.5 (H) 04/22/2022 0308   MCHC 33.4 04/22/2022 0308   RDW 15.3 04/22/2022 0308   RDW 13.8 05/04/2017 1355   LYMPHSABS  0.5 (L) 04/21/2022 0303   LYMPHSABS 1.3 05/04/2017 1355   MONOABS 0.6 04/21/2022 0303   MONOABS 0.6 05/04/2017 1355   EOSABS 0.4 04/21/2022 0303   EOSABS 0.3 05/04/2017 1355   BASOSABS 0.0 04/21/2022 0303   BASOSABS 0.1 05/04/2017 1355      Latest Ref Rng & Units 04/22/2022    3:08 AM 04/21/2022    3:03 AM 04/20/2022    2:57 AM  CMP  Glucose 70 - 99 mg/dL 137  140  112   BUN 8 - 23 mg/dL 15  16  18    Creatinine 0.61 - 1.24 mg/dL 1.06  1.13  1.13   Sodium 135 - 145 mmol/L 134  134  134   Potassium 3.5 - 5.1 mmol/L 3.3  3.3  3.4   Chloride 98 - 111 mmol/L 105  103  103   CO2 22 - 32 mmol/L 21  21  23    Calcium 8.9 - 10.3 mg/dL 9.2  9.1  9.2   Total Protein 6.5 - 8.1 g/dL 4.8  4.8  5.1   Total Bilirubin 0.3 - 1.2 mg/dL 3.7  4.2  5.8   Alkaline Phos 38 - 126 U/L 52  46  41   AST 15 - 41 U/L 29  30  33   ALT 0 - 44 U/L 31  28  33      Radiology Studies: CT Angio Abd/Pel w/ and/or w/o  Result Date: 04/20/2022 CLINICAL DATA:  Preop planning for TIPS EXAM: CTA ABDOMEN AND PELVIS WITHOUT AND WITH CONTRAST TECHNIQUE: Multidetector CT imaging of the abdomen and pelvis was performed using the standard protocol during bolus administration of intravenous contrast. Multiplanar reconstructed images and MIPs were obtained and reviewed to evaluate the vascular anatomy. RADIATION DOSE REDUCTION: This exam was performed according to the departmental dose-optimization program which includes automated exposure control, adjustment of the mA and/or kV according to patient size and/or use of iterative reconstruction technique. CONTRAST:  192m OMNIPAQUE IOHEXOL 350 MG/ML SOLN COMPARISON:  06/01/2021 FINDINGS: VASCULAR Coronary calcifications. Patent hepatic veins, portal vein, SM V splenic vein Aorta: Mild scattered calcified atheromatous plaque. No aneurysm, dissection, or stenosis. Celiac: Patent without evidence of aneurysm, dissection, vasculitis or significant stenosis. SMA: Patent without evidence of  aneurysm, dissection, vasculitis or significant stenosis. Replaced right hepatic arterial supply, an anatomic variant. Renals: Duplicated left, inferior dominant, both patent. Single right, widely patent. IMA: Patent without evidence of aneurysm, dissection, vasculitis or significant stenosis. Inflow: Mild nonocclusive calcified plaque. No aneurysm or dissection. Proximal Outflow: Patent, with extensive medial calcifications Veins: Patent hepatic veins, portal vein, splenic vein, SMV. Enlarged umbilical venous collaterals. Enlarged left mesenteric portosystemic collateral. No significant splenorenal shunt. No evident gastric varices. A few small submucosal esophageal varices. Review of the MIP images confirms the above findings. NON-VASCULAR Lower chest: Small left and moderate right pleural effusions. Dependent atelectasis posteriorly in the lung bases. Subpleural fibrotic changes in the visualized lung bases. Hepatobiliary: Small liver with a nodular contour consistent with cirrhosis. Few scattered stable hepatic cysts, largest 1.3 cm in segment 4. No new liver lesion or biliary ductal dilatation. Cholecystectomy clips. Pancreas: Unremarkable. No pancreatic ductal dilatation or surrounding inflammatory changes. Spleen: Splenomegaly without focal lesion. Adrenals/Urinary  Tract: No adrenal mass. Symmetric renal parenchymal enhancement without focal lesion or hydronephrosis. Urinary bladder is physiologically distended. Stomach/Bowel: Partially distended by ingested material, unremarkable. Duodenal diverticulum. Small bowel is nondilated. Appendix not discretely identified. The colon is partially distended with fecal material. Multiple diverticula from descending and sigmoid segments. Lymphatic: No abdominal or pelvic adenopathy. Reproductive: Prostate is unremarkable. Other: Moderate pelvic ascites.  No free air. Musculoskeletal: Chronic fracture deformity of left iliac wing with herniation of retroperitoneal fat,  ascites, and venous collaterals into the deep subcutaneous tissues. Spondylitic changes in the lower thoracic and lumbar spine. No acute findings. IMPRESSION: 1. No acute findings. 2. Patent hepatic and portal veins. 3. Umbilical and left mesenteric portosystemic venous collaterals. No significant splenorenal shunt. 4. Moderate ascites, bilateral pleural effusions right greater than left. 5. Descending and sigmoid diverticulosis. Electronically Signed   By: Lucrezia Europe M.D.   On: 04/20/2022 17:06     Scheduled Meds:  Chlorhexidine Gluconate Cloth  6 each Topical Daily   lactulose  30 g Oral TID   pantoprazole (PROTONIX) IV  40 mg Intravenous Q12H   rifaximin  550 mg Oral BID   Continuous Infusions:  sodium chloride 10 mL/hr at 04/22/22 0555   cefTRIAXone (ROCEPHIN)  IV Stopped (04/21/22 1843)     LOS: 3 days   Time spent: [place], MD [facility] To contact the attending provider between 7A-7P or the covering provider during after hours 7P-7A, please log into the web site [email or web address] and access using universal [place] password for that web site. If you do not have the password, please call the hospital operator.  04/22/2022, 6:58 AM

## 2022-04-23 ENCOUNTER — Inpatient Hospital Stay (HOSPITAL_COMMUNITY): Admission: RE | Admit: 2022-04-23 | Payer: Medicare HMO | Source: Ambulatory Visit

## 2022-04-23 ENCOUNTER — Inpatient Hospital Stay (HOSPITAL_COMMUNITY): Payer: Medicare HMO

## 2022-04-23 DIAGNOSIS — E872 Acidosis, unspecified: Secondary | ICD-10-CM | POA: Diagnosis not present

## 2022-04-23 LAB — GLUCOSE, CAPILLARY
Glucose-Capillary: 105 mg/dL — ABNORMAL HIGH (ref 70–99)
Glucose-Capillary: 112 mg/dL — ABNORMAL HIGH (ref 70–99)
Glucose-Capillary: 136 mg/dL — ABNORMAL HIGH (ref 70–99)
Glucose-Capillary: 143 mg/dL — ABNORMAL HIGH (ref 70–99)
Glucose-Capillary: 171 mg/dL — ABNORMAL HIGH (ref 70–99)
Glucose-Capillary: 177 mg/dL — ABNORMAL HIGH (ref 70–99)

## 2022-04-23 LAB — COMPREHENSIVE METABOLIC PANEL
ALT: 32 U/L (ref 0–44)
AST: 33 U/L (ref 15–41)
Albumin: 3.1 g/dL — ABNORMAL LOW (ref 3.5–5.0)
Alkaline Phosphatase: 53 U/L (ref 38–126)
Anion gap: 9 (ref 5–15)
BUN: 16 mg/dL (ref 8–23)
CO2: 21 mmol/L — ABNORMAL LOW (ref 22–32)
Calcium: 9.1 mg/dL (ref 8.9–10.3)
Chloride: 104 mmol/L (ref 98–111)
Creatinine, Ser: 0.98 mg/dL (ref 0.61–1.24)
GFR, Estimated: >60 mL/min (ref >60)
Glucose, Bld: 111 mg/dL — ABNORMAL HIGH (ref 70–99)
Potassium: 5.1 mmol/L (ref 3.5–5.1)
Sodium: 134 mmol/L — ABNORMAL LOW (ref 135–145)
Total Bilirubin: 3.7 mg/dL — ABNORMAL HIGH (ref 0.3–1.2)
Total Protein: 4.8 g/dL — ABNORMAL LOW (ref 6.5–8.1)

## 2022-04-23 LAB — CBC WITH DIFFERENTIAL/PLATELET
Abs Immature Granulocytes: 0.06 10*3/uL (ref 0.00–0.07)
Basophils Absolute: 0.1 10*3/uL (ref 0.0–0.1)
Basophils Relative: 1 %
Eosinophils Absolute: 0.6 10*3/uL — ABNORMAL HIGH (ref 0.0–0.5)
Eosinophils Relative: 8 %
HCT: 33.2 % — ABNORMAL LOW (ref 39.0–52.0)
Hemoglobin: 11 g/dL — ABNORMAL LOW (ref 13.0–17.0)
Immature Granulocytes: 1 %
Lymphocytes Relative: 9 %
Lymphs Abs: 0.6 10*3/uL — ABNORMAL LOW (ref 0.7–4.0)
MCH: 35.1 pg — ABNORMAL HIGH (ref 26.0–34.0)
MCHC: 33.1 g/dL (ref 30.0–36.0)
MCV: 106.1 fL — ABNORMAL HIGH (ref 80.0–100.0)
Monocytes Absolute: 1.1 10*3/uL — ABNORMAL HIGH (ref 0.1–1.0)
Monocytes Relative: 15 %
Neutro Abs: 4.8 10*3/uL (ref 1.7–7.7)
Neutrophils Relative %: 66 %
Platelets: 82 10*3/uL — ABNORMAL LOW (ref 150–400)
RBC: 3.13 MIL/uL — ABNORMAL LOW (ref 4.22–5.81)
RDW: 15.8 % — ABNORMAL HIGH (ref 11.5–15.5)
WBC: 7.1 10*3/uL (ref 4.0–10.5)
nRBC: 0.3 % — ABNORMAL HIGH (ref 0.0–0.2)

## 2022-04-23 LAB — BODY FLUID CULTURE W GRAM STAIN
Culture: NO GROWTH
Gram Stain: NONE SEEN

## 2022-04-23 LAB — MAGNESIUM: Magnesium: 2.1 mg/dL (ref 1.7–2.4)

## 2022-04-23 MED ORDER — LIDOCAINE HCL 1 % IJ SOLN
INTRAMUSCULAR | Status: AC
  Administered 2022-04-23: 10 mL
  Filled 2022-04-23: qty 20

## 2022-04-23 NOTE — Progress Notes (Signed)
Patient ID: Peter Nichols, male   DOB: 1945-11-08, 77 y.o.   MRN: [id number] Pt currently stable; no new c/o;  CXR post rt thora today -low lung volumes.  No right pneumothorax.  Lungs are clear; consent signed by pt for TIPS with ectopic variceal embolization, possible paracentesis to be done at [facility] on 7/7; will recheck labs/pt in am

## 2022-04-23 NOTE — Anesthesia Preprocedure Evaluation (Addendum)
Anesthesia Evaluation  Patient identified by MRN, date of birth, ID band Patient awake    Reviewed: Allergy & Precautions, NPO status , Patient's Chart, lab work & pertinent test results  Airway Mallampati: II  TM Distance: >3 FB Neck ROM: Full    Dental no notable dental hx. (+) Poor Dentition, Chipped, Dental Advisory Given   Pulmonary neg pulmonary ROS,    Pulmonary exam normal breath sounds clear to auscultation       Cardiovascular hypertension, Pt. on medications Normal cardiovascular exam Rhythm:Regular Rate:Normal  04/19/2022 Echo 1. Left ventricular ejection fraction, by estimation, is 60 to 65%. The  left ventricle has normal function. The left ventricle has no regional  wall motion abnormalities. Left ventricular diastolic parameters were  normal.  2. Right ventricular systolic function is normal. The right ventricular  size is normal.  3. Left atrial size was mildly dilated.  4. The mitral valve is normal in structure. No evidence of mitral valve  regurgitation. No evidence of mitral stenosis.  5. The aortic valve was not well visualized. There is mild calcification  of the aortic valve. There is mild thickening of the aortic valve. Aortic  valve regurgitation is not visualized. Aortic valve sclerosis is present,  with no evidence of aortic valve  stenosis.  6. The inferior vena cava is dilated in size with >50% respiratory  variability, suggesting right atrial pressure of 8 mmHg.    Neuro/Psych negative neurological ROS  negative psych ROS   GI/Hepatic GERD  ,(+) Cirrhosis   ascites    , dysphagia   Endo/Other    Renal/GU Renal diseaseLab Results      Component                Value               Date                      CREATININE               0.98                04/23/2022                BUN                      16                  04/23/2022                NA                       134 (L)              04/23/2022                K                        5.1                 04/23/2022                CO2                      21 (L)              04/23/2022  Musculoskeletal   Abdominal   Peds  Hematology  (+) Blood dyscrasia, anemia , Lab Results      Component                Value               Date                          HGB                      11.0 (L)            04/23/2022                HCT                      33.2 (L)            04/23/2022                   PLT                      82 (L)              04/23/2022            pancytopenia   Anesthesia Other Findings All: ensure  Reproductive/Obstetrics                            Anesthesia Physical Anesthesia Plan  ASA: 4  Anesthesia Plan: General   Post-op Pain Management:    Induction: Intravenous  PONV Risk Score and Plan: 2 and Treatment may vary due to age or medical condition and Ondansetron  Airway Management Planned: Oral ETT  Additional Equipment: Arterial line and CVP  Intra-op Plan:   Post-operative Plan: Extubation in OR  Informed Consent: I have reviewed the patients History and Physical, chart, labs and discussed the procedure including the risks, benefits and alternatives for the proposed anesthesia with the patient or authorized representative who has indicated his/her understanding and acceptance.     Dental advisory given  Plan Discussed with:   Anesthesia Plan Comments:       Anesthesia Quick Evaluation

## 2022-04-23 NOTE — Procedures (Signed)
Ultrasound-guided therapeutic right thoracentesis performed yielding 1 liter of golden yellow fluid. No immediate complications. Follow-up chest x-ray pending. EBL none.

## 2022-04-23 NOTE — Plan of Care (Signed)
Plan of care reviewed with the patient and patient asked if he had any questions or concerns regarding plan of care or pending procedure in the am?!Pt expressed an understanding and had no questions or concerns. On going assessment Problem: Education: Goal: Knowledge of General Education information will improve Description: Including pain rating scale, medication(s)/side effects and non-pharmacologic comfort measures Outcome: Progressing   Problem: Education: Goal: Knowledge of General Education information will improve Description: Including pain rating scale, medication(s)/side effects and non-pharmacologic comfort measures Outcome: Progressing   Problem: Pain Managment: Goal: General experience of comfort will improve Outcome: Progressing   Problem: Pain Managment: Goal: General experience of comfort will improve Outcome: Progressing   Problem: Safety: Goal: Ability to remain free from injury will improve Outcome: Progressing   Problem: Safety: Goal: Ability to remain free from injury will improve Outcome: Progressing   Problem: Skin Integrity: Goal: Risk for impaired skin integrity will decrease Outcome: Progressing   Problem: Skin Integrity: Goal: Risk for impaired skin integrity will decrease Outcome: Progressing

## 2022-04-23 NOTE — Progress Notes (Signed)
[facility] Gastroenterology Progress Note  Peter Nichols 76 y.o. 1945-10-26   Subjective: Patient seen and examined laying in bed, patient tolerating solid food well.  Reports dark bowel movement last night.  ROS : Review of Systems  Gastrointestinal:  Negative for abdominal pain, blood in stool, constipation, diarrhea, heartburn, melena, nausea and vomiting.  Genitourinary:  Negative for dysuria and urgency.      Objective: Vital signs in last 24 hours: Vitals:   04/23/22 1119 04/23/22 1147  BP: (!) 100/50 (!) 110/49  Pulse:  86  Resp:  20  Temp:    SpO2:  100%    Physical Exam:  General:  Alert, cooperative, no distress, appears stated age  Head:  Normocephalic, without obvious abnormality, atraumatic  Eyes:  Anicteric sclera, EOM's intact  Lungs:   Clear to auscultation bilaterally, respirations unlabored  Heart:  Regular rate and rhythm, S1, S2 normal  Abdomen:   Soft, distended, non-tender, bowel sounds active all four quadrants,  no masses,   Extremities: Extremities normal, atraumatic, no  edema  Pulses: 2+ and symmetric    Lab Results: Recent Labs    04/22/22 0308 04/23/22 0248  NA 134* 134*  K 3.3* 5.1  CL 105 104  CO2 21* 21*  GLUCOSE 137* 111*  BUN 15 16  CREATININE 1.06 0.98  CALCIUM 9.2 9.1  MG  --  2.1   Recent Labs    04/22/22 0308 04/23/22 0248  AST 29 33  ALT 31 32  ALKPHOS 52 53  BILITOT 3.7* 3.7*  PROT 4.8* 4.8*  ALBUMIN 3.2* 3.1*   Recent Labs    04/21/22 0303 04/22/22 0308 04/23/22 0248  WBC 4.5 5.6 7.1  NEUTROABS 3.1  --  4.8  HGB 9.7* 10.1* 11.0*  HCT 28.8* 30.2* 33.2*  MCV 103.6* 103.1* 106.1*  PLT 63* 73* 82*   Recent Labs    04/21/22 0910  LABPROT 25.2*  INR 2.3*      Assessment Decompensated liver cirrhosis   HGB 11.0(10.1) Platelets 82(73) AST 33(29) ALT 32(31)  Alkphos 53(52) TBili 3.7 GFR >60  INR 04/21/2022 2.3  MELD 3.0: 22 at 04/23/2022  2:48 AM MELD-Na: 23 at 04/23/2022  2:48 AM Calculated  from: Serum Creatinine: 0.98 mg/dL (Using min of 1 mg/dL) at 04/23/2022  2:48 AM Serum Sodium: 134 mmol/L at 04/23/2022  2:48 AM Total Bilirubin: 3.7 mg/dL at 04/23/2022  2:48 AM Serum Albumin: 3.1 g/dL at 04/23/2022  2:48 AM INR(ratio): 2.3 at 04/21/2022  9:10 AM Age at listing (hypothetical): 12 years Sex: Male at 04/23/2022  2:48 AM  Patient was under evaluation with CTA abdomen pelvis BRTO protocol for preprocedure mapping.  Tentative plan for TIPS procedure 7/7, patient will need to be transferred to [facility].  Patient discussed with [facility] hepatology team yesterday, they do not recommend TIPS as they felt he had high risk of mortality.  [facility] recommends periodic paracentesis thoracentesis with albumin and diuretics.  Recommendations discussed with patient and his wife yesterday, they understand the risks but would like to proceed with the TIPS procedure.  Scheduled for Friday.   CTA showed patent hepatic and portal veins, moderate ascites, bilateral pleural effusions   Ascites requiring frequent paracentesis Patient is unable to tolerate diuretics due to hypotension. Paracentesis 04/19/2022, removed 3 L of clear golden fluid.  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   Plan: Continue IV ceftriaxone Continue lactulose 30 g 3 times daily and Xifaxan 550 mg twice daily for hepatic encephalopathy. Continue pantoprazole 40 mg twice daily Plan for TIPS procedure Friday.  [facility] will follow.  Arvella Nigh Omolara Carol PA-C 04/23/2022, 2:50 PM  Contact #  [phone]

## 2022-04-23 NOTE — Progress Notes (Addendum)
PROGRESS NOTE   Peter Nichols  OIN:[id number] DOB: 06-30-1946 DOA: 05/01/2022 PCP: [name], MD  Brief Narrative:  76 year old white male Known history of recurrent ascites in the setting of cirrhosis complicated 7/09 by underlying hepatic encephalopathy follows with Dr. Stacie Glaze gastroenterology History of hepatic hydrothorax requiring thoracentesis in the past TIPS was attempted 5/30 but aborted secondary to increased right atrial pressure Neuromuscular disorder HTN HLD Pancytopenia with anemia of chronic disease  Had paracentesis 6/22 (4.5 L off), 6/28 (800 cc off), 6/30-albumin given--diuretics increased, lactulose decreased (2/2 diarrhea)-developed worsening encephalopathy 7/2 diagnostic paracentesis 3 L performed-critical care consulted secondary to hypotension 7/0-critical care signed off  Recently went to [facility] turndown for transplant-has second opinion pending at [place]  Presented to [place] [facility] ED with weakness unable to go downstairs and some confusion in the setting of decreased bowel movements and decrease in lactulose  Sodium 133 BUNs/creatinine 23/1.3 AST/ALT 49/42 ammonia 119 total bili 10.4 BNP 81   Hospital-Problem based course  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 Serafina Royals indicates he has been approved for TIPS procedure and this will occur on 04/30/2022 at [facility] and he will also get a thoracentesis Last paracentesis showed cultures with no growth patient continues on IV ceftriaxone--- would continue this through the procedure and then discontinue the  same 24 hours after procedure He is having 1-2 stools a day and is not confused at this time on his rifaximin as well as lactulose 30 3 times daily Hepatic hydrothorax Had thoracentesis 1 liters 04/23/2022 Hypotension secondary to liver cirrhosis MELD score 24 with a 19% 70-monthmortality Critical care initially was consulted and they have signed off --he was on Levophed for short period of time Diuretics have been held His prior to admission propranolol 10 twice daily had to be held Hepatic encephalopathy Improved from admission apparently-continues on rifaximin 550 twice daily, lactulose 30 3 times daily Mild hypokalemia replaced Potassium is now 5.1-no further replacement   DVT prophylaxis: SCD Code Status: Full Family Communication: None present at this time Disposition:  Status is: Inpatient Remains inpatient appropriate because:   Requires further procedures   Consultants:  Gastroenterology  Procedures: Thoracentesis as above  Antimicrobials: Ceftriaxone   Subjective:  Patient had 2 stools last night they were somewhat soft-he has no distress No fever no abdominal pain-he is a little tremulous Tells me he feels like he is getting more swollen in his abdomen but overall pain is improved Not confused  Objective: Vitals:   04/23/22 0400 04/23/22 0500 04/23/22 0600 04/23/22 0700  BP: (!) 113/41 (!) 117/42 (!) 121/46 (!) 119/40  Pulse: 83 78 82 78  Resp: (!) 21 (!) 21 (!) 21 19  Temp:      TempSrc:      SpO2: 98% 97% 96% 98%  Weight:      Height:        Intake/Output Summary (Last 24 hours) at 04/23/2022 0802 Last data filed at 04/23/2022 0620 Gross per 24 hour  Intake 335.14 ml  Output 300 ml  Net 35.14 ml    Filed Weights   05/15/2022 1411 05/07/2022 2138  Weight: 71.2 kg 73.3 kg  Examination:  Alert coherent white male no distress sitting up in bed Mild icterus no pallor Chest is clear with no wheeze rales rhonchi Abdomen somewhat distended cannot  appreciate hepatosplenomegaly No lower extremity edema Power is 5/5 He is slightly shaky but no overt asterixis   Data Reviewed: personally reviewed   CBC    Component Value Date/Time   WBC 7.1 04/23/2022 0248   RBC 3.13 (L) 04/23/2022 0248   HGB 11.0 (L) 04/23/2022 0248   HGB 16.0 05/04/2017 1355   HCT 33.2 (L) 04/23/2022 0248   HCT 45.9 05/04/2017 1355   PLT 82 (L) 04/23/2022 0248   PLT 100 (L) 05/04/2017 1355   MCV 106.1 (H) 04/23/2022 0248   MCV 94.8 05/04/2017 1355   MCH 35.1 (H) 04/23/2022 0248   MCHC 33.1 04/23/2022 0248   RDW 15.8 (H) 04/23/2022 0248   RDW 13.8 05/04/2017 1355   LYMPHSABS 0.6 (L) 04/23/2022 0248   LYMPHSABS 1.3 05/04/2017 1355   MONOABS 1.1 (H) 04/23/2022 0248   MONOABS 0.6 05/04/2017 1355   EOSABS 0.6 (H) 04/23/2022 0248   EOSABS 0.3 05/04/2017 1355   BASOSABS 0.1 04/23/2022 0248   BASOSABS 0.1 05/04/2017 1355      Latest Ref Rng & Units 04/23/2022    2:48 AM 04/22/2022    3:08 AM 04/21/2022    3:03 AM  CMP  Glucose 70 - 99 mg/dL 111  137  140   BUN 8 - 23 mg/dL 16  15  16    Creatinine 0.61 - 1.24 mg/dL 0.98  1.06  1.13   Sodium 135 - 145 mmol/L 134  134  134   Potassium 3.5 - 5.1 mmol/L 5.1  3.3  3.3   Chloride 98 - 111 mmol/L 104  105  103   CO2 22 - 32 mmol/L 21  21  21    Calcium 8.9 - 10.3 mg/dL 9.1  9.2  9.1   Total Protein 6.5 - 8.1 g/dL 4.8  4.8  4.8   Total Bilirubin 0.3 - 1.2 mg/dL 3.7  3.7  4.2   Alkaline Phos 38 - 126 U/L 53  52  46   AST 15 - 41 U/L 33  29  30   ALT 0 - 44 U/L 32  31  28      Radiology Studies: No results found.   Scheduled Meds:  Chlorhexidine Gluconate Cloth  6 each Topical Daily   lactulose  30 g Oral TID   pantoprazole (PROTONIX) IV  40 mg Intravenous Q12H   potassium chloride  40 mEq Oral BID   rifaximin  550 mg Oral BID   Continuous Infusions:  sodium chloride 10 mL/hr at 04/23/22 0600   cefTRIAXone (ROCEPHIN)  IV Stopped (04/22/22 1724)     LOS: 4 days   Time spent: 75  Nita Sells, MD [facility] To contact the attending provider between 7A-7P or the covering provider during after hours 7P-7A, please log into the web site [email or web address] and access using universal [place] password for that web site. If you do not have the password, please call the hospital operator.  04/23/2022, 8:02 AM

## 2022-04-24 ENCOUNTER — Encounter (HOSPITAL_COMMUNITY): Payer: Self-pay | Admitting: Internal Medicine

## 2022-04-24 ENCOUNTER — Ambulatory Visit (HOSPITAL_COMMUNITY)
Admit: 2022-04-24 | Discharge: 2022-04-24 | Disposition: A | Discharge status: Home / Self Care | Payer: Medicare HMO | Attending: Interventional Radiology | Admitting: Interventional Radiology

## 2022-04-24 ENCOUNTER — Inpatient Hospital Stay (HOSPITAL_COMMUNITY): Payer: Medicare HMO | Admitting: Certified Registered Nurse Anesthetist

## 2022-04-24 ENCOUNTER — Other Ambulatory Visit (HOSPITAL_COMMUNITY): Payer: Medicare HMO

## 2022-04-24 ENCOUNTER — Encounter (HOSPITAL_COMMUNITY): Payer: Self-pay

## 2022-04-24 ENCOUNTER — Encounter (HOSPITAL_COMMUNITY): Admission: EM | Disposition: E | Payer: Self-pay | Source: Home / Self Care | Attending: Internal Medicine

## 2022-04-24 DIAGNOSIS — R188 Other ascites: Secondary | ICD-10-CM | POA: Diagnosis not present

## 2022-04-24 DIAGNOSIS — K7290 Hepatic failure, unspecified without coma: Secondary | ICD-10-CM | POA: Diagnosis not present

## 2022-04-24 DIAGNOSIS — K7460 Unspecified cirrhosis of liver: Secondary | ICD-10-CM

## 2022-04-24 DIAGNOSIS — I10 Essential (primary) hypertension: Secondary | ICD-10-CM

## 2022-04-24 DIAGNOSIS — D649 Anemia, unspecified: Secondary | ICD-10-CM

## 2022-04-24 DIAGNOSIS — I8501 Esophageal varices with bleeding: Secondary | ICD-10-CM | POA: Diagnosis not present

## 2022-04-24 DIAGNOSIS — E872 Acidosis, unspecified: Secondary | ICD-10-CM | POA: Diagnosis not present

## 2022-04-24 HISTORY — PX: IR TIPS: IMG2295

## 2022-04-24 HISTORY — PX: IR PARACENTESIS: IMG2679

## 2022-04-24 HISTORY — PX: IR INTRAVASCULAR ULTRASOUND NON CORONARY: IMG6085

## 2022-04-24 HISTORY — PX: IR US GUIDE VASC ACCESS RIGHT: IMG2390

## 2022-04-24 HISTORY — PX: RADIOLOGY WITH ANESTHESIA: SHX6223

## 2022-04-24 HISTORY — PX: IR EMBO VENOUS NOT HEMORR HEMANG  INC GUIDE ROADMAPPING: IMG5447

## 2022-04-24 LAB — CBC WITH DIFFERENTIAL/PLATELET
Abs Immature Granulocytes: 0.08 10*3/uL — ABNORMAL HIGH (ref 0.00–0.07)
Basophils Absolute: 0.1 10*3/uL (ref 0.0–0.1)
Basophils Relative: 1 %
Eosinophils Absolute: 0.7 10*3/uL — ABNORMAL HIGH (ref 0.0–0.5)
Eosinophils Relative: 9 %
HCT: 33.1 % — ABNORMAL LOW (ref 39.0–52.0)
Hemoglobin: 11.2 g/dL — ABNORMAL LOW (ref 13.0–17.0)
Immature Granulocytes: 1 %
Lymphocytes Relative: 8 %
Lymphs Abs: 0.7 10*3/uL (ref 0.7–4.0)
MCH: 35.1 pg — ABNORMAL HIGH (ref 26.0–34.0)
MCHC: 33.8 g/dL (ref 30.0–36.0)
MCV: 103.8 fL — ABNORMAL HIGH (ref 80.0–100.0)
Monocytes Absolute: 1.2 10*3/uL — ABNORMAL HIGH (ref 0.1–1.0)
Monocytes Relative: 15 %
Neutro Abs: 5.3 10*3/uL (ref 1.7–7.7)
Neutrophils Relative %: 66 %
Platelets: 87 10*3/uL — ABNORMAL LOW (ref 150–400)
RBC: 3.19 MIL/uL — ABNORMAL LOW (ref 4.22–5.81)
RDW: 15.6 % — ABNORMAL HIGH (ref 11.5–15.5)
WBC: 8 10*3/uL (ref 4.0–10.5)
nRBC: 0 % (ref 0.0–0.2)

## 2022-04-24 LAB — POCT I-STAT 7, (LYTES, BLD GAS, ICA,H+H)
Acid-base deficit: 3 mmol/L — ABNORMAL HIGH (ref 0.0–2.0)
Bicarbonate: 21.4 mmol/L (ref 20.0–28.0)
Calcium, Ion: 1.13 mmol/L — ABNORMAL LOW (ref 1.15–1.40)
HCT: 24 % — ABNORMAL LOW (ref 39.0–52.0)
Hemoglobin: 8.2 g/dL — ABNORMAL LOW (ref 13.0–17.0)
O2 Saturation: 100 %
Patient temperature: 34.8
Potassium: 5.1 mmol/L (ref 3.5–5.1)
Sodium: 133 mmol/L — ABNORMAL LOW (ref 135–145)
TCO2: 23 mmol/L (ref 22–32)
pCO2 arterial: 33.5 mmHg (ref 32–48)
pH, Arterial: 7.404 (ref 7.35–7.45)
pO2, Arterial: 228 mmHg — ABNORMAL HIGH (ref 83–108)

## 2022-04-24 LAB — GLUCOSE, CAPILLARY
Glucose-Capillary: 120 mg/dL — ABNORMAL HIGH (ref 70–99)
Glucose-Capillary: 108 mg/dL — ABNORMAL HIGH (ref 70–99)
Glucose-Capillary: 109 mg/dL — ABNORMAL HIGH (ref 70–99)

## 2022-04-24 LAB — BASIC METABOLIC PANEL
Anion gap: 9 (ref 5–15)
BUN: 20 mg/dL (ref 8–23)
CO2: 19 mmol/L — ABNORMAL LOW (ref 22–32)
Calcium: 9 mg/dL (ref 8.9–10.3)
Chloride: 101 mmol/L (ref 98–111)
Creatinine, Ser: 1.02 mg/dL (ref 0.61–1.24)
GFR, Estimated: >60 mL/min (ref >60)
Glucose, Bld: 113 mg/dL — ABNORMAL HIGH (ref 70–99)
Potassium: 5.2 mmol/L — ABNORMAL HIGH (ref 3.5–5.1)
Sodium: 129 mmol/L — ABNORMAL LOW (ref 135–145)

## 2022-04-24 LAB — CULTURE, BLOOD (ROUTINE X 2)
Culture: NO GROWTH
Culture: NO GROWTH
Special Requests: ADEQUATE

## 2022-04-24 LAB — COMPREHENSIVE METABOLIC PANEL
ALT: 34 U/L (ref 0–44)
AST: 33 U/L (ref 15–41)
Albumin: 3.2 g/dL — ABNORMAL LOW (ref 3.5–5.0)
Alkaline Phosphatase: 58 U/L (ref 38–126)
Anion gap: 9 (ref 5–15)
BUN: 21 mg/dL (ref 8–23)
CO2: 20 mmol/L — ABNORMAL LOW (ref 22–32)
Calcium: 9.4 mg/dL (ref 8.9–10.3)
Chloride: 104 mmol/L (ref 98–111)
Creatinine, Ser: 1.04 mg/dL (ref 0.61–1.24)
GFR, Estimated: >60 mL/min (ref >60)
Glucose, Bld: 127 mg/dL — ABNORMAL HIGH (ref 70–99)
Potassium: 5.5 mmol/L — ABNORMAL HIGH (ref 3.5–5.1)
Sodium: 133 mmol/L — ABNORMAL LOW (ref 135–145)
Total Bilirubin: 3.8 mg/dL — ABNORMAL HIGH (ref 0.3–1.2)
Total Protein: 5.1 g/dL — ABNORMAL LOW (ref 6.5–8.1)

## 2022-04-24 LAB — PROTIME-INR
INR: 2.1 — ABNORMAL HIGH (ref 0.8–1.2)
Prothrombin Time: 23.7 seconds — ABNORMAL HIGH (ref 11.4–15.2)

## 2022-04-24 LAB — TYPE AND SCREEN
ABO/RH(D): A POS
Antibody Screen: NEGATIVE

## 2022-04-24 SURGERY — IR WITH ANESTHESIA
Anesthesia: General

## 2022-04-24 MED ORDER — PHENYLEPHRINE 80 MCG/ML (10ML) SYRINGE FOR IV PUSH (FOR BLOOD PRESSURE SUPPORT)
PREFILLED_SYRINGE | INTRAVENOUS | Status: DC | PRN
  Administered 2022-04-24: 40 ug via INTRAVENOUS
  Administered 2022-04-24 (×2): 80 ug via INTRAVENOUS
  Administered 2022-04-24: 40 ug via INTRAVENOUS

## 2022-04-24 MED ORDER — SODIUM CHLORIDE 0.9 % IV SOLN: INTRAVENOUS | Status: DC | PRN

## 2022-04-24 MED ORDER — PROPOFOL 10 MG/ML IV BOLUS
INTRAVENOUS | Status: DC | PRN
  Administered 2022-04-24: 70 mg via INTRAVENOUS

## 2022-04-24 MED ORDER — ALBUMIN HUMAN 5 % IV SOLN
12.5000 g | Freq: Once | INTRAVENOUS | Status: AC
  Administered 2022-04-24: 12.5 g via INTRAVENOUS

## 2022-04-24 MED ORDER — IOHEXOL 300 MG/ML  SOLN
100.0000 mL | Freq: Once | INTRAMUSCULAR | Status: AC | PRN
  Administered 2022-04-24: 30 mL via INTRAVENOUS

## 2022-04-24 MED ORDER — SUGAMMADEX SODIUM 200 MG/2ML IV SOLN
INTRAVENOUS | Status: DC | PRN
  Administered 2022-04-24: 200 mg via INTRAVENOUS

## 2022-04-24 MED ORDER — ONDANSETRON HCL 4 MG/2ML IJ SOLN
INTRAMUSCULAR | Status: DC | PRN
  Administered 2022-04-24: 4 mg via INTRAVENOUS

## 2022-04-24 MED ORDER — LIDOCAINE 2% (20 MG/ML) 5 ML SYRINGE
INTRAMUSCULAR | Status: DC | PRN
  Administered 2022-04-24: 80 mg via INTRAVENOUS

## 2022-04-24 MED ORDER — ROCURONIUM BROMIDE 10 MG/ML (PF) SYRINGE
PREFILLED_SYRINGE | INTRAVENOUS | Status: DC | PRN
  Administered 2022-04-24: 30 mg via INTRAVENOUS
  Administered 2022-04-24: 70 mg via INTRAVENOUS

## 2022-04-24 MED ORDER — IOHEXOL 300 MG/ML  SOLN
100.0000 mL | Freq: Once | INTRAMUSCULAR | Status: AC | PRN
  Administered 2022-04-24: 50 mL via INTRAVENOUS

## 2022-04-24 MED ORDER — ALBUMIN HUMAN 5 % IV SOLN
INTRAVENOUS | Status: AC
  Filled 2022-04-24: qty 250

## 2022-04-24 MED ORDER — FENTANYL CITRATE (PF) 250 MCG/5ML IJ SOLN
INTRAMUSCULAR | Status: DC | PRN
  Administered 2022-04-24: 50 ug via INTRAVENOUS
  Administered 2022-04-24: 25 ug via INTRAVENOUS

## 2022-04-24 MED ORDER — PHENYLEPHRINE HCL-NACL 20-0.9 MG/250ML-% IV SOLN
INTRAVENOUS | Status: DC | PRN
  Administered 2022-04-24: 10 ug/min via INTRAVENOUS

## 2022-04-24 MED ORDER — GELATIN ABSORBABLE 12-7 MM EX MISC
CUTANEOUS | Status: DC | PRN
  Administered 2022-04-24: 1

## 2022-04-24 MED ORDER — GELATIN ABSORBABLE 12-7 MM EX MISC
CUTANEOUS | Status: AC
  Filled 2022-04-24: qty 1

## 2022-04-24 MED ORDER — LACTATED RINGERS IV SOLN: INTRAVENOUS | Status: DC

## 2022-04-24 MED ORDER — FENTANYL CITRATE (PF) 100 MCG/2ML IJ SOLN
INTRAMUSCULAR | Status: AC
  Filled 2022-04-24: qty 2

## 2022-04-24 MED ORDER — ALBUMIN HUMAN 5 % IV SOLN: INTRAVENOUS | Status: DC | PRN

## 2022-04-24 NOTE — Anesthesia Procedure Notes (Signed)
Arterial Line Insertion Start/End07/17/2023 10:50 AM, 04/26/2022 10:54 AM Performed by: Betha Loa, CRNA, CRNA  Patient location: Pre-op. Preanesthetic checklist: patient identified, IV checked, site marked, risks and benefits discussed, surgical consent, monitors and equipment checked, pre-op evaluation, timeout performed and anesthesia consent Lidocaine 1% used for infiltration and patient sedated Left, radial was placed Catheter size: 20 G Hand hygiene performed  and maximum sterile barriers used   Attempts: 1 Procedure performed without using ultrasound guided technique. Following insertion, Biopatch and dressing applied. Post procedure assessment: normal  Patient tolerated the procedure well with no immediate complications.

## 2022-04-24 NOTE — Procedures (Signed)
Interventional Radiology Procedure Note  Procedure:  1) Ultrasound guided paracentesis 2) TIPS creation 3) Ectopic variceal coil embolization  Findings: Please refer to procedural dictation for full description. 7+2 Viatorr, no post deployment balloon molding.  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    Mean Pressures (mmHg): Pre: RA = 6, PV = 24, gradient = 18 Post: RA = 15, PV = 23, gradient = 8  3.9 L ascites drained via RLQ.  Right IJ (10 Fr) and right GSV (8 Fr) access sites.  Complications: None immediate  Estimated Blood Loss: 5 mL  Recommendations: Return to Hospitalist service post-procedure - appreciate continued excellent care. CBC, CMP, INR in the am - trend daily until transaminitis/hyperbilirubinemia peaks. Continue lactulose - monitor for, though expect no increased risk of hepatic encephalopathy given embolization of ectopic varix/portosystemic shunt. IR will follow as inpatient, IR Portal Hypertension [facility] will provide close follow up after discharge.   Ruthann Cancer, MD Pager: [phone] Clinic: [phone]

## 2022-04-24 NOTE — Transfer of Care (Signed)
Immediate Anesthesia Transfer of Care Note  Patient: Peter Nichols  Procedure(s) Performed: TIPS  Patient Location: PACU  Anesthesia Type:General  Level of Consciousness: awake, patient cooperative and responds to stimulation  Airway & Oxygen Therapy: Patient Spontanous Breathing and Patient connected to nasal cannula oxygen  Post-op Assessment: Report given to RN and Post -op Vital signs reviewed and stable  Post vital signs: Reviewed and stable  Last Vitals:  Vitals Value Taken Time  BP 103/46 05/12/2022 1530  Temp    Pulse 83 05/02/2022 1536  Resp 18 04/19/2022 1536  SpO2 97 % 05/08/2022 1536  Vitals shown include unvalidated device data.  Last Pain:  Vitals:   05/12/2022 0800  TempSrc: Oral  PainSc:          Complications: No notable events documented.

## 2022-04-24 NOTE — Anesthesia Procedure Notes (Addendum)
  Central Venous Catheter Insertion Performed by: Barnet Glasgow, MD, anesthesiologist Start/End07/21/2023 10:45 AM, 05/09/2022 11:06 AM Patient location: Pre-op. Preanesthetic checklist: patient identified, IV checked, site marked, risks and benefits discussed, surgical consent, monitors and equipment checked, pre-op evaluation, timeout performed and anesthesia consent Position: Trendelenburg Lidocaine 1% used for infiltration and patient sedated Hand hygiene performed , maximum sterile barriers used  and Seldinger technique used Catheter size: 8 Fr Total catheter length 16. Central line was placed.Double lumen Procedure performed using ultrasound guided technique. Ultrasound Notes:anatomy identified, needle tip was noted to be adjacent to the nerve/plexus identified, no ultrasound evidence of intravascular and/or intraneural injection and image(s) printed for medical record Attempts: 1 Following insertion, dressing applied, line sutured and Biopatch. Post procedure assessment: blood return through all ports  Patient tolerated the procedure well with no immediate complications.

## 2022-04-24 NOTE — Sedation Documentation (Signed)
Pre TIPS RA 6  @1314      PV 24                 Gradient 18  Post TIPS RA 15 @1452       PV 23                  Gradient 8

## 2022-04-24 NOTE — Progress Notes (Addendum)
PROGRESS NOTE   Peter Nichols  DGL:[id number] DOB: September 10, 1946 DOA: 04/20/2022 PCP: [name], MD  Brief Narrative:  76 year old white male Known history of recurrent ascites in the setting of cirrhosis complicated 5/18 by underlying hepatic encephalopathy follows with Dr. Stacie Glaze gastroenterology History of hepatic hydrothorax requiring thoracentesis in the past TIPS was attempted 5/30 but aborted secondary to increased right atrial pressure Neuromuscular disorder HTN HLD Pancytopenia with anemia of chronic disease  Had paracentesis 6/22 (4.5 L off), 6/28 (800 cc off), 6/30-albumin given--diuretics increased, lactulose decreased (2/2 diarrhea)-developed worsening encephalopathy 7/2 diagnostic paracentesis 3 L performed-critical care consulted secondary to hypotension 7/0-critical care signed off  Recently went to [facility] turndown for transplant-has second opinion pending at [place]  Presented to [place] [facility] ED with weakness unable to go downstairs and some confusion in the setting of decreased bowel movements and decrease in lactulose  Sodium 133 BUNs/creatinine 23/1.3 AST/ALT 49/42 ammonia 119 total bili 10.4 BNP 81  7/2-paracentesis 7/6-thoracentesis 1 L 7/7-transfer to [facility] for definitive management with TIPS procedure  Hospital-Problem based course  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 Prior hepatic hydrothorax requiring thoracentesis Prior hepatic encephalopathy Status post 3 L paracentesis 04/19/2022 Maintain MAP above 65-has had intermittent hypotension Hypotension precludes diuretics (was on Aldactone 150 Lasix 40) and limits large volume paracentesis--likely will need another paracentesis while TIPS procedure is done  for TIPS procedure 04/19/2022 at [facility] a Last paracentesis showed cultures with no growth patient continues on IV ceftriaxone--- would continue this through the procedure and then discontinue the same 24  hours after procedure 2-3 stools per day, continue lactulose 30 3 times daily Hepatic hydrothorax Had thoracentesis 1 liters 04/23/2022 Hypotension secondary to liver cirrhosis MELD score 24 with a 19% 36-monthmortality Critical care initially was consulted and they have signed off --he was on Levophed for short period of time and during hospital stay Diuretics have been held His prior to admission propranolol 10 twice daily had to be held Hepatic encephalopathy Improved from admission apparently-continues on rifaximin 550 twice daily, lactulose 30 3 times daily Mild hypokalemia replaced Potassium slightly elevated at 5.5-unclear etiology repeating labs today-on telemetry does not have T wave elevations   DVT prophylaxis: SCD Code Status: Full Family Communication: None present at this time Disposition:  Status is: Inpatient Remains inpatient appropriate because:   Requires further procedures   Consultants:  Gastroenterology  Procedures: Thoracentesis as above  Antimicrobials: Ceftriaxone   Subjective:  Awake coherent no distress Mild abdominal distention no fever no chills ROM intact Daughter at bedside  Objective: Vitals:   05/07/2022 0200 05/04/2022 0300 05/02/2022 0400 04/19/2022 0800  BP: (!) 107/54 (!) 105/48  (!) 127/42  Pulse: 84 78  77  Resp: (!) 21 (!) 21  18  Temp:   97.6 F (36.4 C)   TempSrc:   Oral   SpO2: 94% 98%  97%  Weight:      Height:        Intake/Output Summary (Last 24 hours) at 05/12/2022 0816 Last data filed at 04/23/2022 2200 Gross per 24 hour  Intake --  Output 425 ml  Net -425 ml    Filed Weights   05/05/2022 1411 04/21/2022 2138  Weight: 71.2 kg 73.3 kg    Examination:  Coherent weak appearing male no distress Quite swollen in abdomen with shifting dullness Cannot appreciate HSM Chest is clear without added sound rales or rhonchi S1-S2 no murmur no rub no gallop Neurologically intact-mild tremor  Data Reviewed: personally reviewed    CBC    Component Value Date/Time   WBC 8.0 05/01/2022 0325   RBC 3.19 (L) 04/28/2022 0325   HGB 11.2 (L) 05/03/2022 0325   HGB 16.0 05/04/2017 1355   HCT 33.1 (L) 04/20/2022 0325   HCT 45.9 05/04/2017 1355   PLT 87 (L) 05/01/2022 0325   PLT 100 (L) 05/04/2017 1355   MCV 103.8 (H) 04/21/2022 0325   MCV 94.8 05/04/2017 1355   MCH 35.1 (H) 04/25/2022 0325   MCHC 33.8 05/12/2022 0325   RDW 15.6 (H) 05/17/2022 0325   RDW 13.8 05/04/2017 1355   LYMPHSABS 0.7 05/13/2022 0325   LYMPHSABS 1.3 05/04/2017 1355   MONOABS 1.2 (H) 05/06/2022 0325   MONOABS 0.6 05/04/2017 1355   EOSABS 0.7 (H) 05/07/2022 0325   EOSABS 0.3 05/04/2017 1355   BASOSABS 0.1 05/14/2022 0325   BASOSABS 0.1 05/04/2017 1355      Latest Ref Rng & Units 05/15/2022    3:25 AM 04/23/2022    2:48 AM 04/22/2022    3:08 AM  CMP  Glucose 70 - 99 mg/dL 127  111  137   BUN 8 - 23 mg/dL 21  16  15    Creatinine 0.61 - 1.24 mg/dL 1.04  0.98  1.06   Sodium 135 - 145 mmol/L 133  134  134   Potassium 3.5 - 5.1 mmol/L 5.5  5.1  3.3   Chloride 98 - 111 mmol/L 104  104  105   CO2 22 - 32 mmol/L 20  21  21    Calcium 8.9 - 10.3 mg/dL 9.4  9.1  9.2   Total Protein 6.5 - 8.1 g/dL 5.1  4.8  4.8   Total Bilirubin 0.3 - 1.2 mg/dL 3.8  3.7  3.7   Alkaline Phos 38 - 126 U/L 58  53  52   AST 15 - 41 U/L 33  33  29   ALT 0 - 44 U/L 34  32  31      Radiology Studies: US THORACENTESIS ASP PLEURAL SPACE W/IMG GUIDE  Result Date: 04/23/2022 INDICATION: Patient with history of cirrhosis, hepatic hydrothorax, ascites; request received for therapeutic right thoracentesis. EXAM: ULTRASOUND GUIDED THERAPEUTIC RIGHT THORACENTESIS MEDICATIONS: 10 mL 1% lidocaine COMPLICATIONS: None immediate. PROCEDURE: An ultrasound guided thoracentesis was thoroughly discussed with the patient and questions answered. The benefits, risks, alternatives and complications were also discussed. The patient understands and wishes to proceed with the procedure. Written  consent was obtained. Ultrasound was performed to localize and mark an adequate pocket of fluid in the right chest. The area was then prepped and draped in the normal sterile fashion. 1% Lidocaine was used for local anesthesia. Under ultrasound guidance a 6 Fr Safe-T-Centesis catheter was introduced. Thoracentesis was performed. The catheter was removed and a dressing applied. FINDINGS: A total of approximately 1 liter of golden yellow fluid was removed. IMPRESSION: Successful ultrasound guided therapeutic right thoracentesis yielding 1 liter of pleural fluid. Read by: Rowe Robert, PA-C Electronically Signed   By: Ruthann Cancer M.D.   On: 04/23/2022 12:12   DG Chest 1 View  Result Date: 04/23/2022 CLINICAL DATA:  [id number]; right thoracentesis yielding 1 L of golden yellow fluid EXAM: CHEST  1 VIEW COMPARISON:  May 09, 2022 FINDINGS: The heart size and mediastinal contours are within normal limits. Low lung volumes. Both lungs are clear. No right pneumothorax. The visualized skeletal structures are unremarkable. IMPRESSION: Low lung volumes.  No right pneumothorax.  Lungs  are clear. Electronically Signed   By: Frazier Richards M.D.   On: 04/23/2022 12:05     Scheduled Meds:  [MAR Hold] Chlorhexidine Gluconate Cloth  6 each Topical Daily   [MAR Hold] lactulose  30 g Oral TID   [MAR Hold] pantoprazole (PROTONIX) IV  40 mg Intravenous Q12H   [MAR Hold] rifaximin  550 mg Oral BID   Continuous Infusions:  sodium chloride 10 mL/hr at 04/23/22 0600   [MAR Hold] cefTRIAXone (ROCEPHIN)  IV Stopped (04/23/22 1654)   lactated ringers       LOS: 5 days   Time spent: 20  Nita Sells, MD [facility] To contact the attending provider between 7A-7P or the covering provider during after hours 7P-7A, please log into the web site [email or web address] and access using universal [place] password for that web site. If you do not have the password, please call the hospital operator.  05/01/2022, 8:16 AM

## 2022-04-24 NOTE — Progress Notes (Signed)
PT Cancellation Note  Patient Details Name: JACHIN COURY MRN: [id number] DOB: 1945/11/23   Cancelled Treatment:    Reason Eval/Treat Not Completed: Medical issues which prohibited therapy, Patient to transfer to [facility] for TIPS procedure. Will follow post procedure as indicated.  [place] Office [phone] Weekend pager-[phone]   Claretha Cooper 04/23/2022, 9:06 AM

## 2022-04-24 NOTE — Sedation Documentation (Signed)
Patient transported to PACU with CRNA staff Altha Harm. Sites assessed with [facility]. Clean, dry and intact. No drainage noted from dressings. Soft to palpation.

## 2022-04-24 NOTE — Progress Notes (Signed)
Patient seen alongside Dr. Serafina Royals in PACU s/p Ultrasound guided paracentesis, TIPS creation, Ectopic variceal coil embolization.  Patient resting comfortably. Alert and oriented.  No concerns.    Being transferred to 5W.   Vital signs currently stable.  Soft BP per his usual-- getting albumin replacement therapy.   Central line in place for now-- plan per Anesthesia.   May remove foley-- order placed.   IR to follow.  Dr. Serafina Royals recommends CBC, CMP, INR in the am - trend daily until transaminitis/hyperbilirubinemia peaks. Continue lactulose - monitor for, though expect no increased risk of hepatic encephalopathy given embolization of ectopic varix/portosystemic shunt.  Brynda Greathouse, MS RD PA-C

## 2022-04-24 NOTE — Progress Notes (Signed)
The patient was not seen and examined today. He was transferred from [place] to [facility] and seems to be in the operative area for TIPS today. Dr. [name] will follow tomorrow.

## 2022-04-24 NOTE — H&P (Signed)
Patient Status: [facility]  Assessment and Plan: Recurrent hepatic hydrothorax, ascites.  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 Plan for TIPS creation and ectopic variceal embolization today with Dr. Serafina Royals.   Patient has been transferred from [facility] this AM.   Labs reviewed by Dr. Serafina Royals.  Overall appears stable.  Discussed with CRNA.  MELDNa 23  Patient has been NPO.  He is alert and oriented.  Consent re-signed as it did not come with him from [facility].   May need paracentesis today which is also included in consent.   Risks and benefits of TIPS, BRTO and/or additional variceal embolization were discussed with the patient and/or the patient's family including, but not limited to, infection, bleeding, damage to adjacent structures, worsening hepatic and/or cardiac function, worsening and/or the development of altered mental status/encephalopathy, non-target embolization and death.   This interventional procedure involves the use of X-rays and because of the nature of the planned procedure, it is possible that we will have prolonged use of X-ray fluoroscopy.  Potential radiation risks to you include (but are not limited to) the following: - A slightly elevated risk for cancer  several years later in life. This risk is typically less than 0.5% percent. This risk is low in comparison to the normal incidence of human cancer, which is 33% for women and 50% for men according to the [employer]. - Radiation induced injury can include skin redness, resembling a rash, tissue breakdown / ulcers and hair loss (which can be temporary or permanent).   The likelihood of either of these occurring depends on the difficulty of the procedure and whether you are sensitive to radiation due to  previous procedures, disease, or genetic conditions.   IF your procedure requires a prolonged use of radiation, you will be notified and given written instructions for further action.  It is your responsibility to monitor the irradiated area for the 2 weeks following the procedure and to notify your physician if you are concerned that you have suffered a radiation induced injury.    All of the patient's questions were answered, patient is agreeable to proceed.  Consent signed and in chart.   ______________________________________________________________________   History of Present Illness: Peter Nichols is a 76 y.o. 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  He was previously evaluated for TIPS, however was found with elevated heart pressures on the day of his procedure.  Goal was to optimize and reattempt, however now admitted with clinical deterioration.  Patient and family interested in proceeding with TIPS creation to relieve shunt.   Allergies and medications reviewed.   Review of Systems: A 12 point ROS discussed and pertinent positives are indicated in the HPI above.  All other systems are negative.  Review of Systems  Constitutional:  Negative for fatigue and fever.  Respiratory:  Negative for cough and shortness of breath.   Cardiovascular:  Negative for chest pain.  Gastrointestinal:  Negative for abdominal pain.  Musculoskeletal:  Negative for back pain.  Psychiatric/Behavioral:  Negative for behavioral problems and confusion.     Vital Signs: There were no vitals taken for this visit.  Physical Exam Vitals and nursing  note reviewed.  Constitutional:      General: He is not in acute distress.    Appearance: Normal appearance. He is ill-appearing.  Cardiovascular:     Rate and Rhythm:  Normal rate and regular rhythm.  Pulmonary:     Effort: Pulmonary effort is normal.     Comments: On nasal cannula Abdominal:     General: There is distension.  Skin:    General: Skin is warm and dry.  Neurological:     General: No focal deficit present.     Mental Status: He is alert and oriented to person, place, and time. Mental status is at baseline.  Psychiatric:        Mood and Affect: Mood normal.        Behavior: Behavior normal.        Thought Content: Thought content normal.        Judgment: Judgment normal.      Imaging reviewed.   Labs:  COAGS: Recent Labs    05/27/21 1634 11/07/21 1241 12/10/21 1245 12/23/21 1208 03/17/22 1147 04/23/2022 1755 04/19/22 0258 04/20/22 0812 04/21/22 0910 04/22/2022 0325  INR 1.5*   < > 1.6* 1.7* 1.7*   < > 2.1* 2.5* 2.3* 2.1*  APTT 31  --  35 34 33  --   --   --   --   --    < > = values in this interval not displayed.    BMP: Recent Labs    04/22/22 0308 04/23/22 0248 04/28/2022 0325 05/18/2022 0758  NA 134* 134* 133* 129*  K 3.3* 5.1 5.5* 5.2*  CL 105 104 104 101  CO2 21* 21* 20* 19*  GLUCOSE 137* 111* 127* 113*  BUN 15 16 21 20   CALCIUM 9.2 9.1 9.4 9.0  CREATININE 1.06 0.98 1.04 1.02  GFRNONAA >60 >60 >60 >60       Electronically Signed: Docia Barrier, PA 05/18/2022, 11:09 AM   I spent a total of 15 minutes in face to face in clinical consultation, greater than 50% of which was counseling/coordinating care for venous access.

## 2022-04-24 NOTE — Anesthesia Procedure Notes (Signed)
Procedure Name: Intubation Date/Time: 04/28/2022 12:41 PM  Performed by: Betha Loa, CRNAPre-anesthesia Checklist: Patient identified, Emergency Drugs available, Suction available and Patient being monitored Patient Re-evaluated:Patient Re-evaluated prior to induction Oxygen Delivery Method: Circle System Utilized Preoxygenation: Pre-oxygenation with 100% oxygen Induction Type: IV induction Ventilation: Mask ventilation without difficulty Laryngoscope Size: Mac and 4 Grade View: Grade I Tube type: Oral Tube size: 7.5 mm Number of attempts: 1 Airway Equipment and Method: Stylet and Oral airway Placement Confirmation: ETT inserted through vocal cords under direct vision, positive ETCO2 and breath sounds checked- equal and bilateral Secured at: 23 cm Tube secured with: Tape Dental Injury: Teeth and Oropharynx as per pre-operative assessment

## 2022-04-25 ENCOUNTER — Encounter (HOSPITAL_COMMUNITY): Payer: Self-pay | Admitting: Interventional Radiology

## 2022-04-25 DIAGNOSIS — K7460 Unspecified cirrhosis of liver: Secondary | ICD-10-CM

## 2022-04-25 DIAGNOSIS — K7290 Hepatic failure, unspecified without coma: Secondary | ICD-10-CM | POA: Diagnosis not present

## 2022-04-25 DIAGNOSIS — D696 Thrombocytopenia, unspecified: Secondary | ICD-10-CM | POA: Diagnosis not present

## 2022-04-25 LAB — CBC WITH DIFFERENTIAL/PLATELET
Abs Immature Granulocytes: 0.05 10*3/uL (ref 0.00–0.07)
Basophils Absolute: 0.1 10*3/uL (ref 0.0–0.1)
Basophils Relative: 1 %
Eosinophils Absolute: 0.4 10*3/uL (ref 0.0–0.5)
Eosinophils Relative: 7 %
HCT: 27.8 % — ABNORMAL LOW (ref 39.0–52.0)
Hemoglobin: 9.3 g/dL — ABNORMAL LOW (ref 13.0–17.0)
Immature Granulocytes: 1 %
Lymphocytes Relative: 8 %
Lymphs Abs: 0.5 10*3/uL — ABNORMAL LOW (ref 0.7–4.0)
MCH: 34.7 pg — ABNORMAL HIGH (ref 26.0–34.0)
MCHC: 33.5 g/dL (ref 30.0–36.0)
MCV: 103.7 fL — ABNORMAL HIGH (ref 80.0–100.0)
Monocytes Absolute: 0.6 10*3/uL (ref 0.1–1.0)
Monocytes Relative: 10 %
Neutro Abs: 4.4 10*3/uL (ref 1.7–7.7)
Neutrophils Relative %: 73 %
Platelets: 72 10*3/uL — ABNORMAL LOW (ref 150–400)
RBC: 2.68 MIL/uL — ABNORMAL LOW (ref 4.22–5.81)
RDW: 15.5 % (ref 11.5–15.5)
WBC: 6 10*3/uL (ref 4.0–10.5)
nRBC: 0 % (ref 0.0–0.2)

## 2022-04-25 LAB — COMPREHENSIVE METABOLIC PANEL
ALT: 39 U/L (ref 0–44)
AST: 60 U/L — ABNORMAL HIGH (ref 15–41)
Albumin: 3 g/dL — ABNORMAL LOW (ref 3.5–5.0)
Alkaline Phosphatase: 64 U/L (ref 38–126)
Anion gap: 7 (ref 5–15)
BUN: 17 mg/dL (ref 8–23)
CO2: 19 mmol/L — ABNORMAL LOW (ref 22–32)
Calcium: 9 mg/dL (ref 8.9–10.3)
Chloride: 108 mmol/L (ref 98–111)
Creatinine, Ser: 1.12 mg/dL (ref 0.61–1.24)
GFR, Estimated: >60 mL/min (ref >60)
Glucose, Bld: 164 mg/dL — ABNORMAL HIGH (ref 70–99)
Potassium: 4.4 mmol/L (ref 3.5–5.1)
Sodium: 134 mmol/L — ABNORMAL LOW (ref 135–145)
Total Bilirubin: 4.7 mg/dL — ABNORMAL HIGH (ref 0.3–1.2)
Total Protein: 4.4 g/dL — ABNORMAL LOW (ref 6.5–8.1)

## 2022-04-25 LAB — PREPARE FRESH FROZEN PLASMA

## 2022-04-25 LAB — BPAM FFP
Blood Product Expiration Date: 202307102359
Blood Product Expiration Date: 202307102359
ISSUE DATE / TIME: 202307071212
ISSUE DATE / TIME: 202307071212
Unit Type and Rh: 6200
Unit Type and Rh: 6200

## 2022-04-25 LAB — PROTIME-INR
INR: 2.2 — ABNORMAL HIGH (ref 0.8–1.2)
Prothrombin Time: 24.5 seconds — ABNORMAL HIGH (ref 11.4–15.2)

## 2022-04-25 NOTE — Progress Notes (Signed)
Referring Physician(s): Arta Silence   Supervising Physician: Juliet Rude  Patient Status:  [facility] - In-pt  Chief Complaint:  S/p paracentesis, TIPS creation, and ectopic variceal coil embolization on 7/7 performed by Dr. Serafina Royals.  Subjective:  Patient laying in bed, NAD. Spouse at bedside.  Patient reports that he "could've been better" but he does not feel terrible.  Denies abd pain, N/V, confusion.  Spouse is concerned that patient remains NPO, he has not able to eat for almost 48 hours.  NPO order d/c'd, diet order placed by primary team.   Allergies: Ensure  Medications: Prior to Admission medications   Medication Sig Start Date End Date Taking? Authorizing Provider  b complex vitamins capsule Take 1 capsule by mouth daily.   Yes [provider]  calcium carbonate (TUMS - DOSED IN MG ELEMENTAL CALCIUM) 500 MG chewable tablet Chew 1 tablet by mouth daily as needed for indigestion or heartburn.   Yes [provider]  fluticasone (FLONASE) 50 MCG/ACT nasal spray Place 1-2 sprays into both nostrils daily as needed for allergies or rhinitis.   Yes [provider]  furosemide (LASIX) 40 MG tablet Take 40 mg by mouth daily. 01/09/22  Yes [provider]  lactulose (CHRONULAC) 10 GM/15ML solution Take 45 mLs (30 g total) by mouth 3 (three) times daily. Patient taking differently: Take 30 g by mouth 2 (two) times daily. 01/02/22  Yes Allie Bossier, MD  Multiple Vitamins-Minerals (MULTIVITAMIN MEN 50+) TABS Take 1 tablet by mouth daily with breakfast.   Yes [provider]  pantoprazole (PROTONIX) 40 MG tablet Take 40 mg by mouth daily before breakfast.   Yes [provider]  pravastatin (PRAVACHOL) 40 MG tablet Take 40 mg by mouth daily.   Yes [provider]  Protein POWD Take 1 Scoop by mouth See admin instructions. Unnamed WHEY-FREE protein powder: Mix 1 scoopful of powder into the appropriate amount of liquid  and drink once a day as needed for nutritional support   Yes [provider]  rifaximin (XIFAXAN) 550 MG TABS tablet Take 1 tablet (550 mg total) by mouth 2 (two) times daily. 01/02/22  Yes Allie Bossier, MD  spironolactone (ALDACTONE) 50 MG tablet Take 150 mg by mouth daily. 01/09/22  Yes [provider]  feeding supplement (ENSURE ENLIVE / ENSURE PLUS) LIQD Take 237 mLs by mouth daily. Patient not taking: Reported on 04/23/2022 01/02/22   Allie Bossier, MD  Multiple Vitamin (MULTIVITAMIN WITH MINERALS) TABS tablet Take 1 tablet by mouth daily. Patient not taking: Reported on 04/25/2022 05/25/21   Annita Brod, MD  Nutritional Supplements (,FEEDING SUPPLEMENT, PROSOURCE PLUS) liquid Take 30 mLs by mouth 2 (two) times daily between meals. Patient not taking: Reported on 04/25/2022 01/02/22   Allie Bossier, MD  pravastatin (PRAVACHOL) 20 MG tablet Take 3 tablets (60 mg total) by mouth daily. Patient not taking: Reported on 05/15/2022 01/03/22   Allie Bossier, MD  propranolol (INDERAL) 10 MG tablet Take 1 tablet (10 mg total) by mouth 2 (two) times daily. Patient not taking: Reported on 04/30/2022 06/03/21   Elgergawy, Silver Huguenin, MD     Vital Signs: BP (!) 113/49 (BP Location: Left Arm)   Pulse 85   Temp 98.3 F (36.8 C) (Oral)   Resp 20   Ht 5' 9"  (1.753 m)   Wt 161 lb 9.6 oz (73.3 kg)   SpO2 94%   BMI 23.86 kg/m   Physical Exam Vitals  reviewed.  Constitutional:      General: He is not in acute distress.    Appearance: Normal appearance.  HENT:     Head: Normocephalic.  Neck:     Comments: Dressing on right IJ, site c/d/I \, no s/s of bleeding or infection.  Cardiovascular:     Rate and Rhythm: Normal rate and regular rhythm.  Abdominal:     Comments: + dressing on right lateral abdomen, site c/d/I non tender   Skin:    General: Skin is warm and dry.     Comments: Positive dressing on right groin puncture site. Site is unremarkable with no erythema, edema,  tenderness, bleeding or drainage. Minimal amount of old, dry blood noted on the dressing. Dressing otherwise clean, dry, and intact.    Neurological:     Mental Status: He is alert and oriented to person, place, and time.  Psychiatric:        Mood and Affect: Mood normal.        Behavior: Behavior normal.     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 EXAM: 1. Ultrasound-guided paracentesis 2. Ultrasound-guided access of the right internal jugular vein 3. Ultrasound-guided access of the right common femoral vein 4. Hepatic venogram 5. Intravascular ultrasound 6. Catheterization of the portal vein 7. Portal venous and central manometry 8. Portal venogram 9. Creation of a transhepatic portal vein to hepatic vein shunt 10. Coil embolization of ectopic varix arising from the splenic vein MEDICATIONS: The patient was receiving intravenous antibiotics as an inpatient. ANESTHESIA/SEDATION: General - as administered by the Anesthesia department CONTRAST:  Eighty ML Omnipaque 300, intravenous FLUOROSCOPY TIME:  Fluoroscopy Time: 35.9 minutes (939 mGy). COMPLICATIONS: None immediate. PROCEDURE: Informed written consent was obtained from the patient after a thorough discussion of the procedural risks, benefits and alternatives. All questions were addressed. Maximal Sterile Barrier Technique was utilized including caps, mask, sterile gowns, sterile gloves, sterile drape, hand hygiene and skin antiseptic. A timeout was performed prior to the initiation of the procedure. The procedure was performed with the assistance of my partner, Dr. Ronny Bacon. Preprocedure ultrasound evaluation demonstrated large volume ascites in the right lower quadrant. The procedure was  planned. A small skin nick was made. Under direct ultrasound visualization, a 6 French Safe-T-Centesis needle was directed into the ascites. A total of 3.9 L of translucent, straw-colored fluid was removed throughout the course of the procedure. After drainage, the catheter was removed and a sterile bandage was placed. A preliminary ultrasound of the right groin was performed and demonstrates a patent right common femoral vein. A permanent ultrasound image was recorded. Using a combination of fluoroscopy and ultrasound, an access site was determined. A small dermatotomy was made at the planned puncture site. Using ultrasound guidance, access into the right common femoral vein was obtained with visualization of needle entry into the vessel using a standard micropuncture technique. A wire was advanced into the IVC insert all fascial dilation performed. An 8 [place], 11 cm vascular sheath was placed into the external iliac vein. Through this access site, an 63 [place] ICE catheter was advanced with ease under fluoroscopic guidance to the level of the intrahepatic inferior vena cava. A preliminary ultrasound of the right neck was performed and demonstrates a patent internal jugular  vein. A permanent ultrasound image was recorded. Using a combination of fluoroscopy and ultrasound, an access site was determined. A small dermatotomy was made at the planned puncture site. Using ultrasound guidance, access into the right internal jugular vein was obtained with visualization of needle entry into the vessel using a standard micropuncture technique. A wire was advanced into the IVC and serial fascial dilation performed. A 10 French tips sheath was placed into the internal jugular vein and advanced to the IVC. The jugular sheath was retracted into the right atrium and manometry was performed. A 5 French angled tip catheter was then directed into the middle hepatic vein. Hepatic venogram was performed. These images  demonstrated patent hepatic vein with no stenosis. The catheter was advanced to a wedge portion of the a patent vein over which the 10 French sheath was advanced into the middle hepatic vein. Using ICE ultrasound visualization the catheter as middle hepatic vein as well as the portal anatomy was defined. A planned exit site from the hepatic vein and puncture site from the portal vein was placed into a single sonographic plane. Under direct ultrasound visualization, the ScorpionX needle was advanced into the central right portal vein. Hand injection of contrast confirmed position within the portal system. A Glidewire Advantage was then advanced into the splenic vein. A 5 French marking pigtail catheter was then advanced over the wire into the main portal vein and wire removed. Portal venogram was performed which demonstrated a patent portal vein. There was a large varix arising from the splenic hilum which drained along the left flank and distributed into the left pelvic/left flank wound. Outflow is visualized within the left iliac vein. 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 Portal manometry was then performed. The tract was then dilated to 8 mm with an 8 mm x 8 cm Athletis balloon. A 8-10 mm by 7 + 2 mm of Viatorr endograft was placed. No post deployment balloon molding was performed. Next, the splenic vein was again cannulated and the large ectopic varix was selected. An assortment of 0.035"Azur coils were deployed. After coil deployment, there is persistent contrast flow through the varix. Therefore, a thick Gel-Foam slurry was mixed and administered in small aliquots under direct fluoroscopic visualization near the splenic aspect of the coil pack. There is no evidence of Gel-Foam slurry passing through the coil pack. After injection of a proximally 5 cc, there was complete embolization of the ectopic varix. Completion portogram was performed which demonstrated a patent  indwelling tips stent with brisk antegrade flow. In general, there was improved hepatopetal flow throughout the portal system. After placement of the shunt, right atrial and portal pressures were repeated. The catheters and sheath were removed and manual compression was applied to the right internal jugular and right common femoral venous access sites until hemostasis was achieved. The patient was transferred to the PACU in stable condition. Pre-TIPS Mean Pressures (mmHg): Right atrium: 6 Portal vein: 24 Portosystemic gradient: 18 Post-TIPS Mean Pressures (mmHg): Right atrium:15 Portal vein: 23 Portosystemic gradient: 8 IMPRESSION: 1. 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 2. Successful transjugular portosystemic shunt creation. 3. Successful coil embolization of ectopic varix/portosystemic shunt arising from the splenic vein. 4. Portosystemic gradient of 18 mm Hg (absolute portal venous pressure 24 mm Hg) before shunt placement and 8 mm Hg (absolute portal venous pressure 23 mm Hg) after shunt placement and coil embolization of ectopic varix. PLAN: Interventional  Radiology will follow the patient as an inpatient. The Interventional Radiology Portal Hypertension Clinic will provide close follow-up after discharge. Ruthann Cancer, MD [facility] Radiology Electronically Signed   By: Ruthann Cancer M.D.   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 EXAM: 1. Ultrasound-guided paracentesis 2. Ultrasound-guided access of the right internal jugular vein 3. Ultrasound-guided access of the right common femoral vein 4.  Hepatic venogram 5. Intravascular ultrasound 6. Catheterization of the portal vein 7. Portal venous and central manometry 8. Portal venogram 9. Creation of a transhepatic portal vein to hepatic vein shunt 10. Coil embolization of ectopic varix arising from the splenic vein MEDICATIONS: The patient was receiving intravenous antibiotics as an inpatient. ANESTHESIA/SEDATION: General - as administered by the Anesthesia department CONTRAST:  Eighty ML Omnipaque 300, intravenous FLUOROSCOPY TIME:  Fluoroscopy Time: 35.9 minutes (939 mGy). COMPLICATIONS: None immediate. PROCEDURE: Informed written consent was obtained from the patient after a thorough discussion of the procedural risks, benefits and alternatives. All questions were addressed. Maximal Sterile Barrier Technique was utilized including caps, mask, sterile gowns, sterile gloves, sterile drape, hand hygiene and skin antiseptic. A timeout was performed prior to the initiation of the procedure. The procedure was performed with the assistance of my partner, Dr. Ronny Bacon. Preprocedure ultrasound evaluation demonstrated large volume ascites in the right lower quadrant. The procedure was planned. A small skin nick was made. Under direct ultrasound visualization, a 6 French Safe-T-Centesis needle was directed into the ascites. A total of 3.9 L of translucent, straw-colored fluid was removed throughout the course of the procedure. After drainage, the catheter was removed and a sterile bandage was placed. A preliminary ultrasound of the right groin was performed and demonstrates a patent right common femoral vein. A permanent ultrasound image was recorded. Using a combination of fluoroscopy and ultrasound, an access site was determined. A small dermatotomy was made at the planned puncture site. Using ultrasound guidance, access into the right common femoral vein was obtained with visualization of needle entry into the vessel using a standard micropuncture technique. A  wire was advanced into the IVC insert all fascial dilation performed. An 8 [place], 11 cm vascular sheath was placed into the external iliac vein. Through this access site, an 30 [place] ICE catheter was advanced with ease under fluoroscopic guidance to the level of the intrahepatic inferior vena cava. A preliminary ultrasound of the right neck was performed and demonstrates a patent internal jugular vein. A permanent ultrasound image was recorded. Using a combination of fluoroscopy and ultrasound, an access site was determined. A small dermatotomy was made at the planned puncture site. Using ultrasound guidance, access into the right internal jugular vein was obtained with visualization of needle entry into the vessel using a standard micropuncture technique. A wire was advanced into the IVC and serial fascial dilation performed. A 10 French tips sheath was placed into the internal jugular vein and advanced to the IVC. The jugular sheath was retracted into the right atrium and manometry was performed. A 5 French angled tip catheter was then directed into the middle hepatic vein. Hepatic venogram was performed. These images demonstrated patent hepatic vein with no stenosis. The catheter was advanced to a wedge  portion of the a patent vein over which the 10 French sheath was advanced into the middle hepatic vein. Using ICE ultrasound visualization the catheter as middle hepatic vein as well as the portal anatomy was defined. A planned exit site from the hepatic vein and puncture site from the portal vein was placed into a single sonographic plane. Under direct ultrasound visualization, the ScorpionX needle was advanced into the central right portal vein. Hand injection of contrast confirmed position within the portal system. A Glidewire Advantage was then advanced into the splenic vein. A 5 French marking pigtail catheter was then advanced over the wire into the main portal vein and wire removed. Portal venogram  was performed which demonstrated a patent portal vein. There was a large varix arising from the splenic hilum which drained along the left flank and distributed into the left pelvic/left flank wound. Outflow is visualized within the left iliac vein. 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 Portal manometry was then performed. The tract was then dilated to 8 mm with an 8 mm x 8 cm Athletis balloon. A 8-10 mm by 7 + 2 mm of Viatorr endograft was placed. No post deployment balloon molding was performed. Next, the splenic vein was again cannulated and the large ectopic varix was selected. An assortment of 0.035"Azur coils were deployed. After coil deployment, there is persistent contrast flow through the varix. Therefore, a thick Gel-Foam slurry was mixed and administered in small aliquots under direct fluoroscopic visualization near the splenic aspect of the coil pack. There is no evidence of Gel-Foam slurry passing through the coil pack. After injection of a proximally 5 cc, there was complete embolization of the ectopic varix. Completion portogram was performed which demonstrated a patent indwelling tips stent with brisk antegrade flow. In general, there was improved hepatopetal flow throughout the portal system. After placement of the shunt, right atrial and portal pressures were repeated. The catheters and sheath were removed and manual compression was applied to the right internal jugular and right common femoral venous access sites until hemostasis was achieved. The patient was transferred to the PACU in stable condition. Pre-TIPS Mean Pressures (mmHg): Right atrium: 6 Portal vein: 24 Portosystemic gradient: 18 Post-TIPS Mean Pressures (mmHg): Right atrium:15 Portal vein: 23 Portosystemic gradient: 8 IMPRESSION: 1. 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 2. Successful transjugular portosystemic shunt creation. 3.  Successful coil embolization of ectopic varix/portosystemic shunt arising from the splenic vein. 4. Portosystemic gradient of 18 mm Hg (absolute portal venous pressure 24 mm Hg) before shunt placement and 8 mm Hg (absolute portal venous pressure 23 mm Hg) after shunt placement and coil embolization of ectopic varix. PLAN: Interventional Radiology will follow the patient as an inpatient. The Interventional Radiology Portal Hypertension Clinic will provide close follow-up after discharge. Ruthann Cancer, MD [facility] Radiology Electronically Signed   By: Ruthann Cancer M.D.   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 EXAM: 1. Ultrasound-guided paracentesis 2. Ultrasound-guided access of the right internal jugular vein 3. Ultrasound-guided access of the right common femoral vein 4. Hepatic venogram 5. Intravascular ultrasound 6. Catheterization of the portal vein 7. Portal venous and central  manometry 8. Portal venogram 9. Creation of a transhepatic portal vein to hepatic vein shunt 10. Coil embolization of ectopic varix arising from the splenic vein MEDICATIONS: The patient was receiving intravenous antibiotics as an inpatient. ANESTHESIA/SEDATION: General - as administered by the Anesthesia department CONTRAST:  Eighty ML Omnipaque 300, intravenous FLUOROSCOPY TIME:  Fluoroscopy Time: 35.9 minutes (939 mGy). COMPLICATIONS: None immediate. PROCEDURE: Informed written consent was obtained from the patient after a thorough discussion of the procedural risks, benefits and alternatives. All questions were addressed. Maximal Sterile Barrier Technique was utilized including caps, mask, sterile  gowns, sterile gloves, sterile drape, hand hygiene and skin antiseptic. A timeout was performed prior to the initiation of the procedure. The procedure was performed with the assistance of my partner, Dr. Ronny Bacon. Preprocedure ultrasound evaluation demonstrated large volume ascites in the right lower quadrant. The procedure was planned. A small skin nick was made. Under direct ultrasound visualization, a 6 French Safe-T-Centesis needle was directed into the ascites. A total of 3.9 L of translucent, straw-colored fluid was removed throughout the course of the procedure. After drainage, the catheter was removed and a sterile bandage was placed. A preliminary ultrasound of the right groin was performed and demonstrates a patent right common femoral vein. A permanent ultrasound image was recorded. Using a combination of fluoroscopy and ultrasound, an access site was determined. A small dermatotomy was made at the planned puncture site. Using ultrasound guidance, access into the right common femoral vein was obtained with visualization of needle entry into the vessel using a standard micropuncture technique. A wire was advanced into the IVC insert all fascial dilation performed. An 8 [place], 11 cm vascular sheath was placed into the external iliac vein. Through this access site, an 19 [place] ICE catheter was advanced with ease under fluoroscopic guidance to the level of the intrahepatic inferior vena cava. A preliminary ultrasound of the right neck was performed and demonstrates a patent internal jugular vein. A permanent ultrasound image was recorded. Using a combination of fluoroscopy and ultrasound, an access site was determined. A small dermatotomy was made at the planned puncture site. Using ultrasound guidance, access into the right internal jugular vein was obtained with visualization of needle entry into the vessel using a standard micropuncture technique. A wire was advanced into the IVC and serial  fascial dilation performed. A 10 French tips sheath was placed into the internal jugular vein and advanced to the IVC. The jugular sheath was retracted into the right atrium and manometry was performed. A 5 French angled tip catheter was then directed into the middle hepatic vein. Hepatic venogram was performed. These images demonstrated patent hepatic vein with no stenosis. The catheter was advanced to a wedge portion of the a patent vein over which the 10 French sheath was advanced into the middle hepatic vein. Using ICE ultrasound visualization the catheter as middle hepatic vein as well as the portal anatomy was defined. A planned exit site from the hepatic vein and puncture site from the portal vein was placed into a single sonographic plane. Under direct ultrasound visualization, the ScorpionX needle was advanced into the central right portal vein. Hand injection of contrast confirmed position within the portal system. A Glidewire Advantage was then advanced into the splenic vein. A 5 French marking pigtail catheter was then advanced over the wire into the main portal vein and wire removed. Portal venogram was performed which demonstrated a patent portal vein. There was a large varix arising from the splenic hilum  which drained along the left flank and distributed into the left pelvic/left flank wound. Outflow is visualized within the left iliac vein. 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 Portal manometry was then performed. The tract was then dilated to 8 mm with an 8 mm x 8 cm Athletis balloon. A 8-10 mm by 7 + 2 mm of Viatorr endograft was placed. No post deployment balloon molding was performed. Next, the splenic vein was again cannulated and the large ectopic varix was selected. An assortment of 0.035"Azur coils were deployed. After coil deployment, there is persistent contrast flow through the varix. Therefore, a thick Gel-Foam slurry was mixed and administered in  small aliquots under direct fluoroscopic visualization near the splenic aspect of the coil pack. There is no evidence of Gel-Foam slurry passing through the coil pack. After injection of a proximally 5 cc, there was complete embolization of the ectopic varix. Completion portogram was performed which demonstrated a patent indwelling tips stent with brisk antegrade flow. In general, there was improved hepatopetal flow throughout the portal system. After placement of the shunt, right atrial and portal pressures were repeated. The catheters and sheath were removed and manual compression was applied to the right internal jugular and right common femoral venous access sites until hemostasis was achieved. The patient was transferred to the PACU in stable condition. Pre-TIPS Mean Pressures (mmHg): Right atrium: 6 Portal vein: 24 Portosystemic gradient: 18 Post-TIPS Mean Pressures (mmHg): Right atrium:15 Portal vein: 23 Portosystemic gradient: 8 IMPRESSION: 1. 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 2. Successful transjugular portosystemic shunt creation. 3. Successful coil embolization of ectopic varix/portosystemic shunt arising from the splenic vein. 4. Portosystemic gradient of 18 mm Hg (absolute portal venous pressure 24 mm Hg) before shunt placement and 8 mm Hg (absolute portal venous pressure 23 mm Hg) after shunt placement and coil embolization of ectopic varix. PLAN: Interventional Radiology will follow the patient as an inpatient. The Interventional Radiology Portal Hypertension Clinic will provide close follow-up after discharge. Ruthann Cancer, MD [facility] Electronically Signed   By: Ruthann Cancer M.D.   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 EXAM: 1. Ultrasound-guided paracentesis 2. Ultrasound-guided access of the right internal jugular vein 3. Ultrasound-guided access of the right common femoral vein 4. Hepatic venogram 5. Intravascular ultrasound 6. Catheterization of the portal vein 7. Portal venous and central manometry 8. Portal venogram 9. Creation of a transhepatic portal vein to hepatic vein shunt 10. Coil embolization of ectopic varix arising from the splenic vein MEDICATIONS: The patient was receiving intravenous antibiotics as an inpatient. ANESTHESIA/SEDATION: General - as administered by the Anesthesia department CONTRAST:  Eighty ML Omnipaque 300, intravenous FLUOROSCOPY TIME:  Fluoroscopy Time: 35.9 minutes (939 mGy). COMPLICATIONS: None immediate. PROCEDURE: Informed written consent was obtained from the patient after a thorough discussion of the procedural risks, benefits and alternatives. All questions were addressed. Maximal Sterile Barrier Technique was utilized including caps, mask, sterile gowns, sterile gloves, sterile drape, hand hygiene and skin antiseptic. A timeout was performed prior to the initiation of the procedure. The procedure was performed with the assistance  of my partner, Dr. Ronny Bacon. Preprocedure ultrasound evaluation demonstrated large volume ascites in the right lower quadrant. The procedure was planned. A small skin nick was made. Under direct ultrasound visualization, a 6 French Safe-T-Centesis needle was directed into the ascites. A total of 3.9 L of translucent, straw-colored fluid was removed throughout the course of the procedure. After drainage, the catheter was removed and a sterile bandage was placed. A preliminary ultrasound of the right groin was performed and demonstrates a patent right common femoral vein. A permanent  ultrasound image was recorded. Using a combination of fluoroscopy and ultrasound, an access site was determined. A small dermatotomy was made at the planned puncture site. Using ultrasound guidance, access into the right common femoral vein was obtained with visualization of needle entry into the vessel using a standard micropuncture technique. A wire was advanced into the IVC insert all fascial dilation performed. An 8 [place], 11 cm vascular sheath was placed into the external iliac vein. Through this access site, an 50 [place] ICE catheter was advanced with ease under fluoroscopic guidance to the level of the intrahepatic inferior vena cava. A preliminary ultrasound of the right neck was performed and demonstrates a patent internal jugular vein. A permanent ultrasound image was recorded. Using a combination of fluoroscopy and ultrasound, an access site was determined. A small dermatotomy was made at the planned puncture site. Using ultrasound guidance, access into the right internal jugular vein was obtained with visualization of needle entry into the vessel using a standard micropuncture technique. A wire was advanced into the IVC and serial fascial dilation performed. A 10 French tips sheath was placed into the internal jugular vein and advanced to the IVC. The jugular sheath was retracted into the right atrium and manometry was performed. A 5 French angled tip catheter was then directed into the middle hepatic vein. Hepatic venogram was performed. These images demonstrated patent hepatic vein with no stenosis. The catheter was advanced to a wedge portion of the a patent vein over which the 10 French sheath was advanced into the middle hepatic vein. Using ICE ultrasound visualization the catheter as middle hepatic vein as well as the portal anatomy was defined. A planned exit site from the hepatic vein and puncture site from the portal vein was placed into a single sonographic plane. Under direct  ultrasound visualization, the ScorpionX needle was advanced into the central right portal vein. Hand injection of contrast confirmed position within the portal system. A Glidewire Advantage was then advanced into the splenic vein. A 5 French marking pigtail catheter was then advanced over the wire into the main portal vein and wire removed. Portal venogram was performed which demonstrated a patent portal vein. There was a large varix arising from the splenic hilum which drained along the left flank and distributed into the left pelvic/left flank wound. Outflow is visualized within the left iliac vein. 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 Portal manometry was then performed. The tract was then dilated to 8 mm with an 8 mm x 8 cm Athletis balloon. A 8-10 mm by 7 + 2 mm of Viatorr endograft was placed. No post deployment balloon molding was performed. Next, the splenic vein was again cannulated and the large ectopic varix was selected. An assortment of 0.035"Azur coils were deployed. After coil deployment, there is persistent contrast flow through the varix. Therefore, a thick Gel-Foam slurry was mixed and administered in small aliquots under direct fluoroscopic visualization near the splenic aspect  of the coil pack. There is no evidence of Gel-Foam slurry passing through the coil pack. After injection of a proximally 5 cc, there was complete embolization of the ectopic varix. Completion portogram was performed which demonstrated a patent indwelling tips stent with brisk antegrade flow. In general, there was improved hepatopetal flow throughout the portal system. After placement of the shunt, right atrial and portal pressures were repeated. The catheters and sheath were removed and manual compression was applied to the right internal jugular and right common femoral venous access sites until hemostasis was achieved. The patient was transferred to the PACU in stable condition.  Pre-TIPS Mean Pressures (mmHg): Right atrium: 6 Portal vein: 24 Portosystemic gradient: 18 Post-TIPS Mean Pressures (mmHg): Right atrium:15 Portal vein: 23 Portosystemic gradient: 8 IMPRESSION: 1. 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 2. Successful transjugular portosystemic shunt creation. 3. Successful coil embolization of ectopic varix/portosystemic shunt arising from the splenic vein. 4. Portosystemic gradient of 18 mm Hg (absolute portal venous pressure 24 mm Hg) before shunt placement and 8 mm Hg (absolute portal venous pressure 23 mm Hg) after shunt placement and coil embolization of ectopic varix. PLAN: Interventional Radiology will follow the patient as an inpatient. The Interventional Radiology Portal Hypertension Clinic will provide close follow-up after discharge. Ruthann Cancer, MD [facility] Radiology Electronically Signed   By: Ruthann Cancer M.D.   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 EXAM: 1. Ultrasound-guided paracentesis 2. Ultrasound-guided access of the right internal jugular vein 3. Ultrasound-guided access of the right common femoral vein 4. Hepatic venogram 5. Intravascular ultrasound 6. Catheterization of the portal vein 7. Portal venous and central manometry 8. Portal venogram 9. Creation of a transhepatic portal vein to hepatic vein shunt 10. Coil embolization of ectopic varix arising from the splenic vein MEDICATIONS: The patient was receiving intravenous antibiotics as an inpatient. ANESTHESIA/SEDATION: General - as administered by the Anesthesia department  CONTRAST:  Eighty ML Omnipaque 300, intravenous FLUOROSCOPY TIME:  Fluoroscopy Time: 35.9 minutes (939 mGy). COMPLICATIONS: None immediate. PROCEDURE: Informed written consent was obtained from the patient after a thorough discussion of the procedural risks, benefits and alternatives. All questions were addressed. Maximal Sterile Barrier Technique was utilized including caps, mask, sterile gowns, sterile gloves, sterile drape, hand hygiene and skin antiseptic. A timeout was performed prior to the initiation of the procedure. The procedure was performed with the assistance of my partner, Dr. Ronny Bacon. Preprocedure ultrasound evaluation demonstrated large volume ascites in the right lower quadrant. The procedure was planned. A small skin nick was made. Under direct ultrasound visualization, a 6 French Safe-T-Centesis needle was directed into the ascites. A total of 3.9 L of translucent, straw-colored fluid was removed throughout the course of the procedure. After drainage, the catheter was removed and a sterile bandage was placed. A preliminary ultrasound of the right groin was performed and demonstrates a patent right common femoral vein. A permanent ultrasound image was recorded. Using a combination of fluoroscopy and ultrasound, an access site was determined. A small dermatotomy was made at the planned puncture site. Using ultrasound guidance, access into the right common femoral vein was obtained with visualization of  needle entry into the vessel using a standard micropuncture technique. A wire was advanced into the IVC insert all fascial dilation performed. An 8 [place], 11 cm vascular sheath was placed into the external iliac vein. Through this access site, an 19 [place] ICE catheter was advanced with ease under fluoroscopic guidance to the level of the intrahepatic inferior vena cava. A preliminary ultrasound of the right neck was performed and demonstrates a patent internal jugular vein. A permanent  ultrasound image was recorded. Using a combination of fluoroscopy and ultrasound, an access site was determined. A small dermatotomy was made at the planned puncture site. Using ultrasound guidance, access into the right internal jugular vein was obtained with visualization of needle entry into the vessel using a standard micropuncture technique. A wire was advanced into the IVC and serial fascial dilation performed. A 10 French tips sheath was placed into the internal jugular vein and advanced to the IVC. The jugular sheath was retracted into the right atrium and manometry was performed. A 5 French angled tip catheter was then directed into the middle hepatic vein. Hepatic venogram was performed. These images demonstrated patent hepatic vein with no stenosis. The catheter was advanced to a wedge portion of the a patent vein over which the 10 French sheath was advanced into the middle hepatic vein. Using ICE ultrasound visualization the catheter as middle hepatic vein as well as the portal anatomy was defined. A planned exit site from the hepatic vein and puncture site from the portal vein was placed into a single sonographic plane. Under direct ultrasound visualization, the ScorpionX needle was advanced into the central right portal vein. Hand injection of contrast confirmed position within the portal system. A Glidewire Advantage was then advanced into the splenic vein. A 5 French marking pigtail catheter was then advanced over the wire into the main portal vein and wire removed. Portal venogram was performed which demonstrated a patent portal vein. There was a large varix arising from the splenic hilum which drained along the left flank and distributed into the left pelvic/left flank wound. Outflow is visualized within the left iliac vein. 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 Portal manometry was then performed. The tract was then dilated to 8 mm with an 8 mm x 8 cm Athletis  balloon. A 8-10 mm by 7 + 2 mm of Viatorr endograft was placed. No post deployment balloon molding was performed. Next, the splenic vein was again cannulated and the large ectopic varix was selected. An assortment of 0.035"Azur coils were deployed. After coil deployment, there is persistent contrast flow through the varix. Therefore, a thick Gel-Foam slurry was mixed and administered in small aliquots under direct fluoroscopic visualization near the splenic aspect of the coil pack. There is no evidence of Gel-Foam slurry passing through the coil pack. After injection of a proximally 5 cc, there was complete embolization of the ectopic varix. Completion portogram was performed which demonstrated a patent indwelling tips stent with brisk antegrade flow. In general, there was improved hepatopetal flow throughout the portal system. After placement of the shunt, right atrial and portal pressures were repeated. The catheters and sheath were removed and manual compression was applied to the right internal jugular and right common femoral venous access sites until hemostasis was achieved. The patient was transferred to the PACU in stable condition. Pre-TIPS Mean Pressures (mmHg): Right atrium: 6 Portal vein: 24 Portosystemic gradient: 18 Post-TIPS Mean Pressures (mmHg): Right atrium:15 Portal vein: 23 Portosystemic gradient: 8  IMPRESSION: 1. 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 2. Successful transjugular portosystemic shunt creation. 3. Successful coil embolization of ectopic varix/portosystemic shunt arising from the splenic vein. 4. Portosystemic gradient of 18 mm Hg (absolute portal venous pressure 24 mm Hg) before shunt placement and 8 mm Hg (absolute portal venous pressure 23 mm Hg) after shunt placement and coil embolization of ectopic varix. PLAN: Interventional Radiology will follow the patient as an inpatient. The Interventional Radiology Portal  Hypertension Clinic will provide close follow-up after discharge. Ruthann Cancer, MD [facility] Radiology Electronically Signed   By: Ruthann Cancer M.D.   On: 05/15/2022 15:50   US THORACENTESIS ASP PLEURAL SPACE W/IMG GUIDE  Result Date: 04/23/2022 INDICATION: Patient with history of cirrhosis, hepatic hydrothorax, ascites; request received for therapeutic right thoracentesis. EXAM: ULTRASOUND GUIDED THERAPEUTIC RIGHT THORACENTESIS MEDICATIONS: 10 mL 1% lidocaine COMPLICATIONS: None immediate. PROCEDURE: An ultrasound guided thoracentesis was thoroughly discussed with the patient and questions answered. The benefits, risks, alternatives and complications were also discussed. The patient understands and wishes to proceed with the procedure. Written consent was obtained. Ultrasound was performed to localize and mark an adequate pocket of fluid in the right chest. The area was then prepped and draped in the normal sterile fashion. 1% Lidocaine was used for local anesthesia. Under ultrasound guidance a 6 Fr Safe-T-Centesis catheter was introduced. Thoracentesis was performed. The catheter was removed and a dressing applied. FINDINGS: A total of approximately 1 liter of golden yellow fluid was removed. IMPRESSION: Successful ultrasound guided therapeutic right thoracentesis yielding 1 liter of pleural fluid. Read by: Rowe Robert, PA-C Electronically Signed   By: Ruthann Cancer M.D.   On: 04/23/2022 12:12   DG Chest 1 View  Result Date: 04/23/2022 CLINICAL DATA:  [id number]; right thoracentesis yielding 1 L of golden yellow fluid EXAM: CHEST  1 VIEW COMPARISON:  May 09, 2022 FINDINGS: The heart size and mediastinal contours are within normal limits. Low lung volumes. Both lungs are clear. No right pneumothorax. The visualized skeletal structures are unremarkable. IMPRESSION: Low lung volumes.  No right pneumothorax.  Lungs are clear. Electronically Signed   By: Frazier Richards M.D.   On: 04/23/2022 12:05    Labs:  CBC: Recent Labs    04/22/22 0308 04/23/22 0248 04/23/2022 0325 04/22/2022 1346 04/25/22 0119  WBC 5.6 7.1 8.0  --  6.0  HGB 10.1* 11.0* 11.2* 8.2* 9.3*  HCT 30.2* 33.2* 33.1* 24.0* 27.8*  PLT 73* 82* 87*  --  72*    COAGS: Recent Labs    05/27/21 1634 11/07/21 1241 12/10/21 1245 12/23/21 1208 03/17/22 1147 05/09/2022 1755 04/20/22 0812 04/21/22 0910 04/26/2022 0325 04/25/22 0119  INR 1.5*   < > 1.6* 1.7* 1.7*   < > 2.5* 2.3* 2.1* 2.2*  APTT 31  --  35 34 33  --   --   --   --   --    < > = values in this interval not displayed.    BMP: Recent Labs    04/23/22 0248 04/27/2022 0325 05/13/2022 0758 04/27/2022 1346 04/25/22 0119  NA 134* 133* 129* 133* 134*  K 5.1 5.5* 5.2* 5.1 4.4  CL 104 104 101  --  108  CO2 21* 20* 19*  --  19*  GLUCOSE 111* 127* 113*  --  164*  BUN 16 21 20   --  17  CALCIUM 9.1 9.4 9.0  --  9.0  CREATININE 0.98 1.04 1.02  --  1.12  GFRNONAA >60 >60 >60  --  >60    LIVER FUNCTION TESTS: Recent Labs    04/22/22 0308 04/23/22 0248 05/03/2022 0325 04/25/22 0119  BILITOT 3.7* 3.7* 3.8* 4.7*  AST 29 33 33 60*  ALT 31 32 34 39  ALKPHOS 52 53 58 64  PROT 4.8* 4.8* 5.1* 4.4*  ALBUMIN 3.2* 3.1* 3.2* 3.0*    Assessment and Plan:  76 y.o. 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 Serafina Royals.  Patient stable VSS Hgb stable  AST elevated 60 (33 pre procedure) ALT normal, T bili mildly elevated 4.7 (3.8 pre procedure) INR stable  Puncture sites clean, dry, non tender   Further treatment plan per TRH/ GI Appreciate and agree with the plan.  IR to follow.    Electronically Signed: Tera Mater, PA-C 04/25/2022, 10:35 AM   I spent a total of 15  Minutes at the the patient's bedside AND on the patient's hospital floor or unit, greater than 50% of which was counseling/coordinating care for s/p TIPS and ectopic variceal coil embolization on 7/7  This chart was dictated using voice recognition software.  Despite best efforts to proofread,  errors can occur which can change the documentation meaning.

## 2022-04-25 NOTE — Progress Notes (Signed)
Subjective: Generalized weakness. No abdominal discomfort. No blood in stool or hematemesis.  Objective: Vital signs in last 24 hours: Temp:  [97 F (36.1 C)-98.4 F (36.9 C)] 98.4 F (36.9 C) (07/08 1224) Pulse Rate:  [80-87] 87 (07/08 1224) Resp:  [15-20] 20 (07/08 1224) BP: (91-113)/(42-52) 110/44 (07/08 1224) SpO2:  [94 %-97 %] 95 % (07/08 1224) Weight change:  Last BM Date : 05/14/2022  PE: GEN:  Chronically ill-appearing, NAD SKIN: Scattered ecchymoses and telangiectasias. HEENT:  Scleral icterus bilaterally EXT:  2-3 + edema lower extremities bilaterally  Lab Results: CBC    Component Value Date/Time   WBC 6.0 04/25/2022 0119   RBC 2.68 (L) 04/25/2022 0119   HGB 9.3 (L) 04/25/2022 0119   HGB 16.0 05/04/2017 1355   HCT 27.8 (L) 04/25/2022 0119   HCT 45.9 05/04/2017 1355   PLT 72 (L) 04/25/2022 0119   PLT 100 (L) 05/04/2017 1355   MCV 103.7 (H) 04/25/2022 0119   MCV 94.8 05/04/2017 1355   MCH 34.7 (H) 04/25/2022 0119   MCHC 33.5 04/25/2022 0119   RDW 15.5 04/25/2022 0119   RDW 13.8 05/04/2017 1355   LYMPHSABS 0.5 (L) 04/25/2022 0119   LYMPHSABS 1.3 05/04/2017 1355   MONOABS 0.6 04/25/2022 0119   MONOABS 0.6 05/04/2017 1355   EOSABS 0.4 04/25/2022 0119   EOSABS 0.3 05/04/2017 1355   BASOSABS 0.1 04/25/2022 0119   BASOSABS 0.1 05/04/2017 1355  CMP     Component Value Date/Time   NA 134 (L) 04/25/2022 0119   K 4.4 04/25/2022 0119   CL 108 04/25/2022 0119   CO2 19 (L) 04/25/2022 0119   GLUCOSE 164 (H) 04/25/2022 0119   BUN 17 04/25/2022 0119   CREATININE 1.12 04/25/2022 0119   CALCIUM 9.0 04/25/2022 0119   PROT 4.4 (L) 04/25/2022 0119   ALBUMIN 3.0 (L) 04/25/2022 0119   AST 60 (H) 04/25/2022 0119   ALT 39 04/25/2022 0119   ALKPHOS 64 04/25/2022 0119   BILITOT 4.7 (H) 04/25/2022 0119   GFRNONAA >60 04/25/2022 0119   GFRAA  11/29/2007 0605    >60        The eGFR has been calculated using the MDRD equation. This calculation has not been validated  in all clinical   Assessment:   Alcohol mediated cirrhosis. Refractory ascites.  TIPS yesterday.  Plan:   Monitor labs. OOBTC, work with PT in house, perhaps starting tomorrow. Appreciate IR assistance with post-TIPS care. [facility] will follow.   Peter Nichols, Peter Nichols 04/25/2022, 2:01 PM   Cell [phone] If no answer or after 5 PM call [phone]

## 2022-04-25 NOTE — Progress Notes (Addendum)
[facility] Progress Note  Patient: Peter Nichols     BUL:[id number]  DOA: 05/09/2022   PCP: [name], MD       Brief hospital course: This is a 76 year old male with hypertension, neuromuscular disorder, anemia, alcoholic cirrhosis, hepatic encephalopathy and recurrent ascites and hepatic hydrothoraxchronic thrombocytopenia.  TIPS was attempted on 5/30 but aborted secondary to increased right atrial pressure.  [facility] turned him down for liver transplant due to age. He was admitted to the hospital on 7/1 for weakness, found to have a lactic acid of 4.3 and given IV fluids.  Critical care was consulted on 7/2.  He underwent a paracentesis on 7/2 of 3 liters. He was evaluated by [facility] on 7/3 decided to reattempt a TIPS on 7/7. 7/6, he underwent 1 L thoracentesis from the right pleural cavity. On 7/7, he was transferred from [facility] to [facility] and received a TIPS.  Subjective:  He feels abdomen is a little bloated today but otherwise has no complaints Assessment and Plan: Principal Problem:   Decompensated alcoholic hepatic cirrhosis, acute metabolic encephalopathy, refractory ascites and hepatic hydrothorax -Status post TIPS on 7/7-IR and GI following - Receiving lactulose and rifaximin- the encephalopathy from admission has resolved -Propranolol, furosemide and spironolactone are on hold and he has been receiving 50 cc an hour of fluids since 7/2. -[facility] declined him and GI has documented that he is likely not a candidate for transplant due to his age -DC ceftriaxone   Active Problems: Lactic acidosis - Resolved after IV fluids given  Hyperkalemia - Resolved  Macrocytic anemia, thrombocytopenia - Follow intermittently     DVT prophylaxis:  SCDs Start: 04/27/2022 2044     Code Status: Full Code  Consultants: GI, IR Level of Care: Level of care: Progressive Disposition Plan:  Status is: Inpatient Remains inpatient appropriate because:  Status post TIPS-needs PT eval  Objective:   Vitals:   05/08/2022 2000 05/12/2022 2342 04/25/22 0849 04/25/22 1224  BP: (!) 101/45 (!) 102/46 (!) 113/49 (!) 110/44  Pulse: 80 82 85 87  Resp: 18 19 20 20   Temp: (!) 97 F (36.1 C) 98 F (36.7 C) 98.3 F (36.8 C) 98.4 F (36.9 C)  TempSrc: Axillary Axillary Oral Oral  SpO2: 95% 94% 94% 95%  Weight:      Height:       Filed Weights   05/13/2022 1411 04/27/2022 2138  Weight: 71.2 kg 73.3 kg   Exam: General exam: Appears comfortable  HEENT: PERRLA, oral mucosa moist, no sclera icterus or thrush Respiratory system: Clear to auscultation. Respiratory effort normal. Cardiovascular system: S1 & S2 heard, regular rate and rhythm Gastrointestinal system: Abdomen soft, non-tender, moderately distended. Normal bowel sounds   Central nervous system: Alert and oriented. No focal neurological deficits. Extremities: No cyanosis, clubbing or edema Skin: No rashes or ulcers-severe extensive bruising Psychiatry:  Mood & affect appropriate.    Imaging and lab data was personally reviewed    CBC: Recent Labs  Lab 04/20/22 0257 04/21/22 0303 04/22/22 0308 04/23/22 0248 04/21/2022 0325 04/22/2022 1346 04/25/22 0119  WBC 5.8 4.5 5.6 7.1 8.0  --  6.0  NEUTROABS 4.6 3.1  --  4.8 5.3  --  4.4  HGB 9.5* 9.7* 10.1* 11.0* 11.2* 8.2* 9.3*  HCT 27.5* 28.8* 30.2* 33.2* 33.1* 24.0* 27.8*  MCV 101.5* 103.6* 103.1* 106.1* 103.8*  --  103.7*  PLT 67* 63* 73* 82* 87*  --  72*   Basic Metabolic Panel: Recent  Labs  Lab 04/30/2022 1755 04/19/22 0258 04/22/22 0308 04/23/22 0248 05/09/2022 0325 04/23/2022 0758 05/03/2022 1346 04/25/22 0119  NA  --    < > 134* 134* 133* 129* 133* 134*  K  --    < > 3.3* 5.1 5.5* 5.2* 5.1 4.4  CL  --    < > 105 104 104 101  --  108  CO2  --    < > 21* 21* 20* 19*  --  19*  GLUCOSE  --    < > 137* 111* 127* 113*  --  164*  BUN  --    < > 15 16 21 20   --  17  CREATININE  --    < > 1.06 0.98 1.04 1.02  --  1.12  CALCIUM  --    < >  9.2 9.1 9.4 9.0  --  9.0  MG 2.0  --   --  2.1  --   --   --   --    < > = values in this interval not displayed.   GFR: Estimated Creatinine Clearance: 57 mL/min (by C-G formula based on SCr of 1.12 mg/dL).  Scheduled Meds:  Chlorhexidine Gluconate Cloth  6 each Topical Daily   lactulose  30 g Oral TID   pantoprazole (PROTONIX) IV  40 mg Intravenous Q12H   rifaximin  550 mg Oral BID   Continuous Infusions:  sodium chloride Stopped (04/20/2022 1507)   cefTRIAXone (ROCEPHIN)  IV 2 g (05/15/2022 1738)   lactated ringers Stopped (05/08/2022 0948)     LOS: 6 days   Author: Debbe Odea  04/25/2022 2:54 PM

## 2022-04-26 ENCOUNTER — Inpatient Hospital Stay (HOSPITAL_COMMUNITY): Payer: Medicare HMO

## 2022-04-26 DIAGNOSIS — J948 Other specified pleural conditions: Secondary | ICD-10-CM

## 2022-04-26 DIAGNOSIS — K7290 Hepatic failure, unspecified without coma: Secondary | ICD-10-CM | POA: Diagnosis not present

## 2022-04-26 DIAGNOSIS — K7682 Hepatic encephalopathy: Secondary | ICD-10-CM

## 2022-04-26 DIAGNOSIS — K7031 Alcoholic cirrhosis of liver with ascites: Secondary | ICD-10-CM

## 2022-04-26 DIAGNOSIS — E875 Hyperkalemia: Secondary | ICD-10-CM

## 2022-04-26 DIAGNOSIS — K7460 Unspecified cirrhosis of liver: Secondary | ICD-10-CM | POA: Diagnosis not present

## 2022-04-26 DIAGNOSIS — D539 Nutritional anemia, unspecified: Secondary | ICD-10-CM

## 2022-04-26 LAB — COMPREHENSIVE METABOLIC PANEL
ALT: 47 U/L — ABNORMAL HIGH (ref 0–44)
AST: 68 U/L — ABNORMAL HIGH (ref 15–41)
Albumin: 2.9 g/dL — ABNORMAL LOW (ref 3.5–5.0)
Alkaline Phosphatase: 98 U/L (ref 38–126)
Anion gap: 8 (ref 5–15)
BUN: 16 mg/dL (ref 8–23)
CO2: 21 mmol/L — ABNORMAL LOW (ref 22–32)
Calcium: 9.1 mg/dL (ref 8.9–10.3)
Chloride: 107 mmol/L (ref 98–111)
Creatinine, Ser: 1.13 mg/dL (ref 0.61–1.24)
GFR, Estimated: >60 mL/min (ref >60)
Glucose, Bld: 114 mg/dL — ABNORMAL HIGH (ref 70–99)
Potassium: 4.8 mmol/L (ref 3.5–5.1)
Sodium: 136 mmol/L (ref 135–145)
Total Bilirubin: 5.7 mg/dL — ABNORMAL HIGH (ref 0.3–1.2)
Total Protein: 4.5 g/dL — ABNORMAL LOW (ref 6.5–8.1)

## 2022-04-26 LAB — CBC WITH DIFFERENTIAL/PLATELET
Abs Immature Granulocytes: 0.09 10*3/uL — ABNORMAL HIGH (ref 0.00–0.07)
Basophils Absolute: 0.1 10*3/uL (ref 0.0–0.1)
Basophils Relative: 1 %
Eosinophils Absolute: 0.4 10*3/uL (ref 0.0–0.5)
Eosinophils Relative: 6 %
HCT: 28.2 % — ABNORMAL LOW (ref 39.0–52.0)
Hemoglobin: 9.7 g/dL — ABNORMAL LOW (ref 13.0–17.0)
Immature Granulocytes: 1 %
Lymphocytes Relative: 10 %
Lymphs Abs: 0.7 10*3/uL (ref 0.7–4.0)
MCH: 35.1 pg — ABNORMAL HIGH (ref 26.0–34.0)
MCHC: 34.4 g/dL (ref 30.0–36.0)
MCV: 102.2 fL — ABNORMAL HIGH (ref 80.0–100.0)
Monocytes Absolute: 1 10*3/uL (ref 0.1–1.0)
Monocytes Relative: 14 %
Neutro Abs: 4.6 10*3/uL (ref 1.7–7.7)
Neutrophils Relative %: 68 %
Platelets: 66 10*3/uL — ABNORMAL LOW (ref 150–400)
RBC: 2.76 MIL/uL — ABNORMAL LOW (ref 4.22–5.81)
RDW: 15.6 % — ABNORMAL HIGH (ref 11.5–15.5)
WBC: 6.8 10*3/uL (ref 4.0–10.5)
nRBC: 0 % (ref 0.0–0.2)

## 2022-04-26 LAB — AMMONIA: Ammonia: 194 umol/L — ABNORMAL HIGH (ref 9–35)

## 2022-04-26 MED ORDER — BISACODYL 10 MG RE SUPP
10.0000 mg | Freq: Once | RECTAL | Status: AC
Start: 2022-04-26 — End: 2022-04-26
  Administered 2022-04-26: 10 mg via RECTAL
  Filled 2022-04-26: qty 1

## 2022-04-26 MED ORDER — MILK AND MOLASSES ENEMA
1.0000 | Freq: Once | RECTAL | Status: AC
  Administered 2022-04-27: 240 mL via RECTAL
  Filled 2022-04-26: qty 240

## 2022-04-26 MED ORDER — LACTULOSE ENEMA
300.0000 mL | Freq: Once | ORAL | Status: AC
  Administered 2022-04-26: 300 mL via RECTAL
  Filled 2022-04-26: qty 300

## 2022-04-26 NOTE — Plan of Care (Signed)
Paged by RN. Patient is taking Lactulose and Rifaximin as ordered and has not had a BM In 2 days. He is currently lethargic. I have ordered a Lactulose enema. Charting reflected that he had 3 BMs yesterday and 1 BM an hour ago. RN confirms that was incorrect charting and has been changed to reflect that he has not had any BMs.

## 2022-04-26 NOTE — Progress Notes (Signed)
Patient abdomen appeared to be more distended. Spoke with wife who stated patient had not had a bowel movement today, and spoke with tech as well. MD paged due to patient not having a bowel movement despite lactulose given. Ordered to give lactulose enema. Will continue to monitor.

## 2022-04-26 NOTE — Plan of Care (Signed)

## 2022-04-26 NOTE — Plan of Care (Signed)
  Problem: Education: Goal: Knowledge of General Education information will improve Description: Including pain rating scale, medication(s)/side effects and non-pharmacologic comfort measures 04/26/2022 1918 by Rexanne Mano, RN Outcome: Progressing 04/26/2022 1917 by Rexanne Mano, RN Outcome: Progressing   Problem: Health Behavior/Discharge Planning: Goal: Ability to manage health-related needs will improve 04/26/2022 1918 by Rexanne Mano, RN Outcome: Progressing 04/26/2022 1917 by Rexanne Mano, RN Outcome: Progressing   Problem: Clinical Measurements: Goal: Ability to maintain clinical measurements within normal limits will improve Outcome: Progressing Goal: Will remain free from infection 04/26/2022 1918 by Rexanne Mano, RN Outcome: Progressing 04/26/2022 1917 by Rexanne Mano, RN Outcome: Progressing Goal: Diagnostic test results will improve Outcome: Progressing Goal: Respiratory complications will improve Outcome: Progressing Goal: Cardiovascular complication will be avoided Outcome: Progressing   Problem: Activity: Goal: Risk for activity intolerance will decrease Outcome: Progressing   Problem: Nutrition: Goal: Adequate nutrition will be maintained Outcome: Progressing   Problem: Coping: Goal: Level of anxiety will decrease Outcome: Progressing   Problem: Elimination: Goal: Will not experience complications related to bowel motility Outcome: Progressing Goal: Will not experience complications related to urinary retention Outcome: Progressing   Problem: Pain Managment: Goal: General experience of comfort will improve Outcome: Progressing   Problem: Safety: Goal: Ability to remain free from injury will improve Outcome: Progressing   Problem: Skin Integrity: Goal: Risk for impaired skin integrity will decrease Outcome: Progressing   Problem: Education: Goal: Understanding of CV disease, CV risk reduction, and recovery process will improve Outcome:  Progressing Goal: Individualized Educational Video(s) Outcome: Progressing   Problem: Activity: Goal: Ability to return to baseline activity level will improve Outcome: Progressing   Problem: Cardiovascular: Goal: Ability to achieve and maintain adequate cardiovascular perfusion will improve Outcome: Progressing Goal: Vascular access site(s) Level 0-1 will be maintained Outcome: Progressing   Problem: Health Behavior/Discharge Planning: Goal: Ability to safely manage health-related needs after discharge will improve Outcome: Progressing

## 2022-04-26 NOTE — Progress Notes (Signed)
Subjective: Somnolence and confusion overnight.  Objective: Vital signs in last 24 hours: Temp:  [98 F (36.7 C)-98.2 F (36.8 C)] 98.1 F (36.7 C) (07/09 0834) Pulse Rate:  [88-102] 90 (07/09 0834) Resp:  [18-20] 18 (07/09 0834) BP: (102-122)/(47-69) 102/56 (07/09 0834) SpO2:  [91 %-95 %] 95 % (07/09 0834) Weight change:  Last BM Date : 05/09/2022  PE: GEN:  Somnolent, sleeping, unable to carry on conversation  Lab Results: CBC    Component Value Date/Time   WBC 6.8 04/26/2022 0248   RBC 2.76 (L) 04/26/2022 0248   HGB 9.7 (L) 04/26/2022 0248   HGB 16.0 05/04/2017 1355   HCT 28.2 (L) 04/26/2022 0248   HCT 45.9 05/04/2017 1355   PLT 66 (L) 04/26/2022 0248   PLT 100 (L) 05/04/2017 1355   MCV 102.2 (H) 04/26/2022 0248   MCV 94.8 05/04/2017 1355   MCH 35.1 (H) 04/26/2022 0248   MCHC 34.4 04/26/2022 0248   RDW 15.6 (H) 04/26/2022 0248   RDW 13.8 05/04/2017 1355   LYMPHSABS 0.7 04/26/2022 0248   LYMPHSABS 1.3 05/04/2017 1355   MONOABS 1.0 04/26/2022 0248   MONOABS 0.6 05/04/2017 1355   EOSABS 0.4 04/26/2022 0248   EOSABS 0.3 05/04/2017 1355   BASOSABS 0.1 04/26/2022 0248   BASOSABS 0.1 05/04/2017 1355  CMP     Component Value Date/Time   NA 134 (L) 04/25/2022 0119   K 4.4 04/25/2022 0119   CL 108 04/25/2022 0119   CO2 19 (L) 04/25/2022 0119   GLUCOSE 164 (H) 04/25/2022 0119   BUN 17 04/25/2022 0119   CREATININE 1.12 04/25/2022 0119   CALCIUM 9.0 04/25/2022 0119   PROT 4.4 (L) 04/25/2022 0119   ALBUMIN 3.0 (L) 04/25/2022 0119   AST 60 (H) 04/25/2022 0119   ALT 39 04/25/2022 0119   ALKPHOS 64 04/25/2022 0119   BILITOT 4.7 (H) 04/25/2022 0119   GFRNONAA >60 04/25/2022 0119   GFRAA  11/29/2007 0605    >60        The eGFR has been calculated using the MDRD equation. This calculation has not been validated in all clinical   Assessment:   Alcohol mediated cirrhosis. Refractory ascites.  TIPS yesterday. Confusion.  Suspect hepatic encephalopathy (prior  history pre-TIPS of this, but acute exacerbation likely direct result of his TIPS).  Plan:   Rifaximin and lactulose.  If oral medications become problematic, then would consider lactulose enemas. Check ammonia. [facility] will follow.   JERAMIA, SALEEBY 04/26/2022, 12:24 PM   Cell [phone] If no answer or after 5 PM call [phone]

## 2022-04-26 NOTE — Progress Notes (Signed)
Patient off the unit for xray at this time.

## 2022-04-26 NOTE — Progress Notes (Addendum)
[facility] Progress Note  Patient: Peter Nichols     OJJ:[id number]  DOA: 05/01/2022   PCP: [name], MD       Brief hospital course: This is a 76 year old male with hypertension, neuromuscular disorder, anemia, alcoholic cirrhosis, hepatic encephalopathy and recurrent ascites and hepatic hydrothoraxchronic thrombocytopenia.  TIPS was attempted on 5/30 but aborted secondary to increased right atrial pressure.  [facility] turned him down for liver transplant due to age. He was admitted to the hospital on 7/1 for weakness, found to have a lactic acid of 4.3 and given IV fluids.  Critical care was consulted on 7/2.  He underwent a paracentesis on 7/2 of 3 liters. He was evaluated by [facility] on 7/3 decided to reattempt a TIPS on 7/7. 7/6, he underwent 1 L thoracentesis from the right pleural cavity. On 7/7, he was transferred from [facility] to [facility] and received a TIPS.  Subjective:  Somnolent. Per wife she fed him breakfast and he was more alert at that time but did not eat much. RN confirms that he took his meds including Lactulose this AM. Assessment and Plan: Principal Problem:   Decompensated alcoholic hepatic cirrhosis, acute metabolic encephalopathy, refractory ascites and hepatic hydrothorax -Status post TIPS on 7/7-IR and GI following -Propranolol, furosemide and spironolactone are on hold and he has been receiving 50 cc an hour of fluids since 7/2. -[facility] declined him and GI has documented that he is likely not a candidate for transplant due to his age  Active problems: Acute hepatic encephalopathy -Lethargic today despite taking TID Lactulose and Rifaximin likely due to hepatic encephalopathy - start Lactulose enemas-cont IVF  Active Problems: Lactic acidosis - Resolved after IV fluids given  Hyperkalemia - Resolved  Macrocytic anemia, thrombocytopenia - Follow intermittently         DVT prophylaxis:  SCDs Start: 05/07/2022 2044      Code Status: Full Code  Consultants: GI, IR Level of Care: Level of care: Progressive Disposition Plan:  Status is: Inpatient Remains inpatient appropriate because: Status post TIPS-needs PT eval  Objective:   Vitals:   04/26/22 0312 04/26/22 0823 04/26/22 0834 04/26/22 1238  BP: (!) 122/57  (!) 102/56 (!) 105/54  Pulse: 91  90   Resp: 20  18   Temp: 98 F (36.7 C)  98.1 F (36.7 C) 98.7 F (37.1 C)  TempSrc: Oral  Oral Oral  SpO2: 91% 95% 95%   Weight:      Height:       Filed Weights   05/12/2022 1411 05/18/2022 2138  Weight: 71.2 kg 73.3 kg   Exam: General exam: somnolent HEENT: PERRLA, oral mucosa moist, no sclera icterus or thrush Respiratory system: Clear to auscultation. Respiratory effort normal. Cardiovascular system: S1 & S2 heard, regular rate and rhythm Gastrointestinal system: Abdomen soft, non-tender, + distenstion- Normal bowel sounds   Central nervous system: somnolent, flaccid extremities Extremities: No cyanosis, clubbing or edema Skin: extensive bruising  Imaging and lab data was personally reviewed    CBC: Recent Labs  Lab 04/21/22 0303 04/22/22 0308 04/23/22 0248 05/10/2022 0325 04/21/2022 1346 04/25/22 0119 04/26/22 0248  WBC 4.5 5.6 7.1 8.0  --  6.0 6.8  NEUTROABS 3.1  --  4.8 5.3  --  4.4 4.6  HGB 9.7* 10.1* 11.0* 11.2* 8.2* 9.3* 9.7*  HCT 28.8* 30.2* 33.2* 33.1* 24.0* 27.8* 28.2*  MCV 103.6* 103.1* 106.1* 103.8*  --  103.7* 102.2*  PLT 63* 73* 82* 87*  --  72* 66*    Basic Metabolic Panel: Recent Labs  Lab 04/22/22 0308 04/23/22 0248 04/29/2022 0325 05/05/2022 0758 05/12/2022 1346 04/25/22 0119  NA 134* 134* 133* 129* 133* 134*  K 3.3* 5.1 5.5* 5.2* 5.1 4.4  CL 105 104 104 101  --  108  CO2 21* 21* 20* 19*  --  19*  GLUCOSE 137* 111* 127* 113*  --  164*  BUN 15 16 21 20   --  17  CREATININE 1.06 0.98 1.04 1.02  --  1.12  CALCIUM 9.2 9.1 9.4 9.0  --  9.0  MG  --  2.1  --   --   --   --     GFR: Estimated Creatinine Clearance: 57  mL/min (by C-G formula based on SCr of 1.12 mg/dL).  Scheduled Meds:  Chlorhexidine Gluconate Cloth  6 each Topical Daily   lactulose  30 g Oral TID   lactulose  300 mL Rectal Once   pantoprazole (PROTONIX) IV  40 mg Intravenous Q12H   rifaximin  550 mg Oral BID   Continuous Infusions:  sodium chloride Stopped (05/02/2022 1507)   lactated ringers 50 mL/hr at 04/25/22 2125     LOS: 7 days   Author: Debbe Odea  04/26/2022 2:17 PM

## 2022-04-26 NOTE — Progress Notes (Signed)
Patient continues to be lethargic at this time. Only a small bowel movement since enema given. MD paged. Orders placed for xray of abdomen. Will continue to monitor.

## 2022-04-26 NOTE — Progress Notes (Addendum)
PT Cancellation Note  Patient Details Name: Peter Nichols MRN: [id number] DOB: 1946-09-20   Cancelled Treatment:    Reason Eval/Treat Not Completed: Patient not medically ready  Patient transferred from [place] (evaluated 04/22/22). Currently lethargic and RN reports she has call in to MD. Will defer PT at this time.    [place]  Office [phone]   Rexanne Mano 04/26/2022, 1:44 PM

## 2022-04-27 ENCOUNTER — Inpatient Hospital Stay (HOSPITAL_COMMUNITY): Payer: Medicare HMO

## 2022-04-27 DIAGNOSIS — K7290 Hepatic failure, unspecified without coma: Secondary | ICD-10-CM | POA: Diagnosis not present

## 2022-04-27 DIAGNOSIS — K7460 Unspecified cirrhosis of liver: Secondary | ICD-10-CM | POA: Diagnosis not present

## 2022-04-27 DIAGNOSIS — K7682 Hepatic encephalopathy: Secondary | ICD-10-CM | POA: Diagnosis not present

## 2022-04-27 DIAGNOSIS — K7031 Alcoholic cirrhosis of liver with ascites: Secondary | ICD-10-CM | POA: Diagnosis not present

## 2022-04-27 LAB — COMPREHENSIVE METABOLIC PANEL
ALT: 45 U/L — ABNORMAL HIGH (ref 0–44)
AST: 55 U/L — ABNORMAL HIGH (ref 15–41)
Albumin: 2.8 g/dL — ABNORMAL LOW (ref 3.5–5.0)
Alkaline Phosphatase: 104 U/L (ref 38–126)
Anion gap: 9 (ref 5–15)
BUN: 15 mg/dL (ref 8–23)
CO2: 20 mmol/L — ABNORMAL LOW (ref 22–32)
Calcium: 9 mg/dL (ref 8.9–10.3)
Chloride: 106 mmol/L (ref 98–111)
Creatinine, Ser: 0.99 mg/dL (ref 0.61–1.24)
GFR, Estimated: >60 mL/min (ref >60)
Glucose, Bld: 125 mg/dL — ABNORMAL HIGH (ref 70–99)
Potassium: 4.5 mmol/L (ref 3.5–5.1)
Sodium: 135 mmol/L (ref 135–145)
Total Bilirubin: 7.7 mg/dL — ABNORMAL HIGH (ref 0.3–1.2)
Total Protein: 4.3 g/dL — ABNORMAL LOW (ref 6.5–8.1)

## 2022-04-27 LAB — CBC WITH DIFFERENTIAL/PLATELET
Abs Immature Granulocytes: 0.07 10*3/uL (ref 0.00–0.07)
Basophils Absolute: 0.1 10*3/uL (ref 0.0–0.1)
Basophils Relative: 1 %
Eosinophils Absolute: 0.4 10*3/uL (ref 0.0–0.5)
Eosinophils Relative: 6 %
HCT: 28.4 % — ABNORMAL LOW (ref 39.0–52.0)
Hemoglobin: 10 g/dL — ABNORMAL LOW (ref 13.0–17.0)
Immature Granulocytes: 1 %
Lymphocytes Relative: 9 %
Lymphs Abs: 0.6 10*3/uL — ABNORMAL LOW (ref 0.7–4.0)
MCH: 35.8 pg — ABNORMAL HIGH (ref 26.0–34.0)
MCHC: 35.2 g/dL (ref 30.0–36.0)
MCV: 101.8 fL — ABNORMAL HIGH (ref 80.0–100.0)
Monocytes Absolute: 0.8 10*3/uL (ref 0.1–1.0)
Monocytes Relative: 12 %
Neutro Abs: 4.4 10*3/uL (ref 1.7–7.7)
Neutrophils Relative %: 71 %
Platelets: 70 10*3/uL — ABNORMAL LOW (ref 150–400)
RBC: 2.79 MIL/uL — ABNORMAL LOW (ref 4.22–5.81)
RDW: 15.3 % (ref 11.5–15.5)
WBC: 6.2 10*3/uL (ref 4.0–10.5)
nRBC: 0 % (ref 0.0–0.2)

## 2022-04-27 LAB — AMMONIA: Ammonia: 95 umol/L — ABNORMAL HIGH (ref 9–35)

## 2022-04-27 MED ORDER — LACTULOSE ENEMA
300.0000 mL | Freq: Once | ORAL | Status: AC
  Administered 2022-04-27: 300 mL via RECTAL
  Filled 2022-04-27: qty 300

## 2022-04-27 MED ORDER — LACTULOSE 10 GM/15ML PO SOLN
30.0000 g | Freq: Three times a day (TID) | ORAL | Status: DC
  Administered 2022-04-27 – 2022-04-29 (×6): 30 g via ORAL
  Filled 2022-04-27 (×6): qty 45

## 2022-04-27 MED ORDER — LACTULOSE ENEMA
300.0000 mL | Freq: Three times a day (TID) | RECTAL | Status: DC
Start: 2022-04-27 — End: 2022-04-27
  Filled 2022-04-27 (×2): qty 300

## 2022-04-27 NOTE — Progress Notes (Signed)
Patient remained lethargic unable to have good amout of stools. Suppository given Malosse and milk enema given, Patient had small sizes of bm x 3, not sufficient enough for enema given. 64f NG tube inserted to right nare, patient tolerated well, KUB verified placement, NGT connected to intermittent Low Wall Suction. Will continue to monitor.

## 2022-04-27 NOTE — Plan of Care (Signed)
  Problem: Health Behavior/Discharge Planning: Goal: Ability to manage health-related needs will improve Outcome: Progressing   Problem: Activity: Goal: Risk for activity intolerance will decrease Outcome: Progressing   Problem: Coping: Goal: Level of anxiety will decrease Outcome: Progressing   Problem: Elimination: Goal: Will not experience complications related to bowel motility Outcome: Progressing   Problem: Pain Managment: Goal: General experience of comfort will improve Outcome: Progressing   Problem: Safety: Goal: Ability to remain free from injury will improve Outcome: Progressing   Problem: Skin Integrity: Goal: Risk for impaired skin integrity will decrease Outcome: Progressing

## 2022-04-27 NOTE — Progress Notes (Signed)
NG tube clamped per MD order. Clear liquid diet placed at this time. Will continue to monitor.

## 2022-04-27 NOTE — Progress Notes (Signed)
PT Cancellation Note  Patient Details Name: Peter Nichols MRN: [id number] DOB: 28-May-1946   Cancelled Treatment:    Reason Eval/Treat Not Completed: Patient declined, no reason specified. Attempted to see pt for mobility progression; however, pt refusing participation at this time. Wife at beside and pt with NGT to wall suction at this time. PT will continue to f/u with pt acutely as available and appropriate.    [place] 04/27/2022, 9:08 AM

## 2022-04-27 NOTE — Care Management Important Message (Signed)
Important Message  Patient Details  Name: Peter Nichols MRN: [id number] Date of Birth: 1946/07/30   Medicare Important Message Given:  Yes     [name] 04/27/2022, 4:03 PM

## 2022-04-27 NOTE — Progress Notes (Signed)
[facility] Progress Note  Peter Nichols 76 y.o. [birth year]  CC: Alcohol mediated cirrhosis s/p TIPS   Subjective: Patient continues to be confused.  Wife states that patient showed improvement yesterday and began talking to her complaining of having to urinate.  ROS : Review of Systems  Unable to perform ROS: Acuity of condition      Objective: Vital signs in last 24 hours: Vitals:   04/27/22 0358 04/27/22 0730  BP: (!) 124/56 109/60  Pulse: 83 83  Resp: 18 14  Temp: 97.6 F (36.4 C) 97.6 F (36.4 C)  SpO2: 94% 97%    Physical Exam:  General:  Somnolent, sleeping, unable to carry on conversation.  Not oriented.  Head:  Normocephalic, without obvious abnormality, atraumatic  Eyes:  icteric sclera, EOM's intact  Lungs:   Clear to auscultation bilaterally, respirations unlabored  Heart:  Regular rate and rhythm, S1, S2 normal  Abdomen:   Soft, non-tender, bowel sounds active all four quadrants,  no masses,     Lab Results: Recent Labs    04/26/22 0339 04/27/22 0119  NA 136 135  K 4.8 4.5  CL 107 106  CO2 21* 20*  GLUCOSE 114* 125*  BUN 16 15  CREATININE 1.13 0.99  CALCIUM 9.1 9.0   Recent Labs    04/26/22 0339 04/27/22 0119  AST 68* 55*  ALT 47* 45*  ALKPHOS 98 104  BILITOT 5.7* 7.7*  PROT 4.5* 4.3*  ALBUMIN 2.9* 2.8*   Recent Labs    04/26/22 0248 04/27/22 0119  WBC 6.8 6.2  NEUTROABS 4.6 4.4  HGB 9.7* 10.0*  HCT 28.2* 28.4*  MCV 102.2* 101.8*  PLT 66* 70*   Recent Labs    04/25/22 0119  LABPROT 24.5*  INR 2.2*      Assessment Alcoholic cirrhosis s/p TIPS 7/8 -HGB 10.0 Platelets 70 AST 55 ALT 45  Alkphos 104 TBili 7.7 GFR >60  INR 04/25/2022 2.2  - MELD-Na: 24 -Ammonia 95 -BUN 15, creatinine 0.99, GFR greater than 60   Plan: Continue rifaximin and lactulose.  If unable to tolerate oral medications can consider lactulose enemas Continue to monitor mental status s/p TIPS [facility] will follow  Garnette Scheuermann  PA-C 04/27/2022, 10:00 AM  Contact #  [phone]

## 2022-04-27 NOTE — Progress Notes (Signed)
Referring Physician(s): Arta Silence   Supervising Physician: Ruthann Cancer  Patient Status:  [facility] - In-pt  Chief Complaint:  S/p paracentesis, TIPS creation, and ectopic variceal coil embolization on 7/7 performed by Dr. Serafina Royals.  Subjective: Pt laying in bed, sleeping. Daughter at bedside, states that patient is much better than yesterday, he is less confused.  Patient denies any complaints today, daughter says he never complaints about anything.   Patient had minimal BM despite oral Lactulose, has been receiving Lactulose enema but remains confused.  Ammonia 95  today  (194 yesterday.) Pt also underwent NGT placement today due to abdominal distension.    Allergies: Ensure  Medications: Prior to Admission medications   Medication Sig Start Date End Date Taking? Authorizing Provider  b complex vitamins capsule Take 1 capsule by mouth daily.   Yes [provider]  calcium carbonate (TUMS - DOSED IN MG ELEMENTAL CALCIUM) 500 MG chewable tablet Chew 1 tablet by mouth daily as needed for indigestion or heartburn.   Yes [provider]  fluticasone (FLONASE) 50 MCG/ACT nasal spray Place 1-2 sprays into both nostrils daily as needed for allergies or rhinitis.   Yes [provider]  furosemide (LASIX) 40 MG tablet Take 40 mg by mouth daily. 01/09/22  Yes [provider]  lactulose (CHRONULAC) 10 GM/15ML solution Take 45 mLs (30 g total) by mouth 3 (three) times daily. Patient taking differently: Take 30 g by mouth 2 (two) times daily. 01/02/22  Yes Allie Bossier, MD  Multiple Vitamins-Minerals (MULTIVITAMIN MEN 50+) TABS Take 1 tablet by mouth daily with breakfast.   Yes [provider]  pantoprazole (PROTONIX) 40 MG tablet Take 40 mg by mouth daily before breakfast.   Yes [provider]  pravastatin (PRAVACHOL) 40 MG tablet Take 40 mg by mouth daily.   Yes [provider]  Protein POWD Take 1 Scoop by mouth See admin  instructions. Unnamed WHEY-FREE protein powder: Mix 1 scoopful of powder into the appropriate amount of liquid and drink once a day as needed for nutritional support   Yes [provider]  rifaximin (XIFAXAN) 550 MG TABS tablet Take 1 tablet (550 mg total) by mouth 2 (two) times daily. 01/02/22  Yes Allie Bossier, MD  spironolactone (ALDACTONE) 50 MG tablet Take 150 mg by mouth daily. 01/09/22  Yes [provider]  feeding supplement (ENSURE ENLIVE / ENSURE PLUS) LIQD Take 237 mLs by mouth daily. Patient not taking: Reported on 04/30/2022 01/02/22   Allie Bossier, MD  Multiple Vitamin (MULTIVITAMIN WITH MINERALS) TABS tablet Take 1 tablet by mouth daily. Patient not taking: Reported on 05/15/2022 05/25/21   Annita Brod, MD  Nutritional Supplements (,FEEDING SUPPLEMENT, PROSOURCE PLUS) liquid Take 30 mLs by mouth 2 (two) times daily between meals. Patient not taking: Reported on 04/29/2022 01/02/22   Allie Bossier, MD  pravastatin (PRAVACHOL) 20 MG tablet Take 3 tablets (60 mg total) by mouth daily. Patient not taking: Reported on 05/06/2022 01/03/22   Allie Bossier, MD  propranolol (INDERAL) 10 MG tablet Take 1 tablet (10 mg total) by mouth 2 (two) times daily. Patient not taking: Reported on 05/06/2022 06/03/21   Elgergawy, Silver Huguenin, MD     Vital Signs: BP 109/60 (BP Location: Right Arm)   Pulse 83   Temp 97.6 F (36.4 C) (Oral)   Resp 14   Ht 5' 9"  (1.753 m)   Wt 161 lb 9.6 oz (73.3 kg)   SpO2 97%  BMI 23.86 kg/m   Physical Exam Vitals reviewed.  Constitutional:      General: He is not in acute distress.    Appearance: Normal appearance.  HENT:     Head: Normocephalic.  Neck:     Comments: Dressing on right IJ, site c/d/I, moderate ecchymosis noted but no s/s of bleeding or infection.  Cardiovascular:     Rate and Rhythm: Normal rate and regular rhythm.  Skin:    General: Skin is warm and dry.     Comments: + rigt groin puncture site. Site with moderate  ecchymosis but otherwise unremarkable with no erythema, edema, tenderness, bleeding or drainage.  Neurological:     Mental Status: He is alert and oriented to person, place, and time.  Psychiatric:        Mood and Affect: Mood normal.        Behavior: Behavior normal.     Imaging: DG Abd Portable 1V  Result Date: 04/27/2022 CLINICAL DATA:  NG tube placement. EXAM: PORTABLE ABDOMEN - 1 VIEW COMPARISON:  04/27/2022. FINDINGS: There is redemonstration of multiple loops of gas-filled distended small bowel in the abdomen measuring up to 3.5 cm. An enteric tube terminates in the stomach. Cholecystectomy clips, vascular coils, and stent appear stable. IMPRESSION: 1. Stable dilated loops of small bowel in the abdomen, consistent with history of ileus. 2. The NG tube terminates in the stomach. Electronically Signed   By: Brett Fairy M.D.   On: 04/27/2022 02:23   DG Abd 1 View  Addendum Date: 04/27/2022   ADDENDUM REPORT: 04/27/2022 01:15 ADDENDUM: Critical findings were reported to patient's nurse Esther at 1:15 a.m. Electronically Signed   By: Brett Fairy M.D.   On: 04/27/2022 01:15   Result Date: 04/27/2022 CLINICAL DATA:  Ileus, NG tube placement. EXAM: ABDOMEN - 1 VIEW COMPARISON:  None Available. FINDINGS: Multiple dilated air-fluid loops of small bowel are noted in the abdomen measuring up to 3.6 cm. The reported nasogastric tube is seen at the right lung base and is likely in the right mainstem bronchus. Stable metallic coils are noted in the left upper quadrant, surgical clips and stent in the right upper quadrant. IMPRESSION: 1. Nasogastric tube projects over the right lung base and is likely in the right mainstem bronchus and should be repositioned. 2. Stable gas-filled dilated loops of small bowel, compatible with history of ileus. Electronically Signed: By: Brett Fairy M.D. On: 04/27/2022 01:01   DG Abd Portable 1V  Result Date: 04/26/2022 CLINICAL DATA:  Ileus. EXAM: PORTABLE ABDOMEN -  1 VIEW COMPARISON:  Abdominal CTA 04/20/2022 FINDINGS: TIPS in the right upper quadrant. Vascular coils in the left upper quadrant. Cholecystectomy clips. Mild diffuse gaseous prominence of small bowel, up 3.8 cm in dimension. Small volume of formed stool in the colon. No evidence of free air. IMPRESSION: Mild diffuse gaseous prominence of small bowel, consistent with provided history of ileus. Electronically Signed   By: Keith Rake M.D.   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 EXAM: 1. Ultrasound-guided paracentesis 2. Ultrasound-guided access of the right internal jugular vein 3. Ultrasound-guided access of the right common femoral vein 4. Hepatic venogram 5. Intravascular ultrasound 6. Catheterization of the portal vein 7. Portal venous and central manometry  8. Portal venogram 9. Creation of a transhepatic portal vein to hepatic vein shunt 10. Coil embolization of ectopic varix arising from the splenic vein MEDICATIONS: The patient was receiving intravenous antibiotics as an inpatient. ANESTHESIA/SEDATION: General - as administered by the Anesthesia department CONTRAST:  Eighty ML Omnipaque 300, intravenous FLUOROSCOPY TIME:  Fluoroscopy Time: 35.9 minutes (939 mGy). COMPLICATIONS: None immediate. PROCEDURE: Informed written consent was obtained from the patient after a thorough discussion of the procedural risks, benefits and alternatives. All questions were addressed. Maximal Sterile Barrier Technique was utilized including caps, mask, sterile gowns, sterile gloves, sterile drape, hand hygiene and skin antiseptic. A timeout was performed prior to the initiation of the procedure. The procedure was performed with the assistance of  my partner, Dr. Ronny Bacon. Preprocedure ultrasound evaluation demonstrated large volume ascites in the right lower quadrant. The procedure was planned. A small skin nick was made. Under direct ultrasound visualization, a 6 French Safe-T-Centesis needle was directed into the ascites. A total of 3.9 L of translucent, straw-colored fluid was removed throughout the course of the procedure. After drainage, the catheter was removed and a sterile bandage was placed. A preliminary ultrasound of the right groin was performed and demonstrates a patent right common femoral vein. A permanent ultrasound image was recorded. Using a combination of fluoroscopy and ultrasound, an access site was determined. A small dermatotomy was made at the planned puncture site. Using ultrasound guidance, access into the right common femoral vein was obtained with visualization of needle entry into the vessel using a standard micropuncture technique. A wire was advanced into the IVC insert all fascial dilation performed. An 8 [place], 11 cm vascular sheath was placed into the external iliac vein. Through this access site, an 53 [place] ICE catheter was advanced with ease under fluoroscopic guidance to the level of the intrahepatic inferior vena cava. A preliminary ultrasound of the right neck was performed and demonstrates a patent internal jugular vein. A permanent ultrasound image was recorded. Using a combination of fluoroscopy and ultrasound, an access site was determined. A small dermatotomy was made at the planned puncture site. Using ultrasound guidance, access into the right internal jugular vein was obtained with visualization of needle entry into the vessel using a standard micropuncture technique. A wire was advanced into the IVC and serial fascial dilation performed. A 10 French tips sheath was placed into the internal jugular vein and advanced to the IVC. The jugular sheath was retracted into the right atrium and manometry was  performed. A 5 French angled tip catheter was then directed into the middle hepatic vein. Hepatic venogram was performed. These images demonstrated patent hepatic vein with no stenosis. The catheter was advanced to a wedge portion of the a patent vein over which the 10 French sheath was advanced into the middle hepatic vein. Using ICE ultrasound visualization the catheter as middle hepatic vein as well as the portal anatomy was defined. A planned exit site from the hepatic vein and puncture site from the portal vein was placed into a single sonographic plane. Under direct ultrasound visualization, the ScorpionX needle was advanced into the central right portal vein. Hand injection of contrast confirmed position within the portal system. A Glidewire Advantage was then advanced into the splenic vein. A 5 French marking pigtail catheter was then advanced over the wire into the main portal vein and wire removed. Portal venogram was performed which demonstrated a patent portal vein. There was a large varix arising from the splenic hilum which  drained along the left flank and distributed into the left pelvic/left flank wound. Outflow is visualized within the left iliac vein. 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 Portal manometry was then performed. The tract was then dilated to 8 mm with an 8 mm x 8 cm Athletis balloon. A 8-10 mm by 7 + 2 mm of Viatorr endograft was placed. No post deployment balloon molding was performed. Next, the splenic vein was again cannulated and the large ectopic varix was selected. An assortment of 0.035"Azur coils were deployed. After coil deployment, there is persistent contrast flow through the varix. Therefore, a thick Gel-Foam slurry was mixed and administered in small aliquots under direct fluoroscopic visualization near the splenic aspect of the coil pack. There is no evidence of Gel-Foam slurry passing through the coil pack. After injection of a  proximally 5 cc, there was complete embolization of the ectopic varix. Completion portogram was performed which demonstrated a patent indwelling tips stent with brisk antegrade flow. In general, there was improved hepatopetal flow throughout the portal system. After placement of the shunt, right atrial and portal pressures were repeated. The catheters and sheath were removed and manual compression was applied to the right internal jugular and right common femoral venous access sites until hemostasis was achieved. The patient was transferred to the PACU in stable condition. Pre-TIPS Mean Pressures (mmHg): Right atrium: 6 Portal vein: 24 Portosystemic gradient: 18 Post-TIPS Mean Pressures (mmHg): Right atrium:15 Portal vein: 23 Portosystemic gradient: 8 IMPRESSION: 1. 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 2. Successful transjugular portosystemic shunt creation. 3. Successful coil embolization of ectopic varix/portosystemic shunt arising from the splenic vein. 4. Portosystemic gradient of 18 mm Hg (absolute portal venous pressure 24 mm Hg) before shunt placement and 8 mm Hg (absolute portal venous pressure 23 mm Hg) after shunt placement and coil embolization of ectopic varix. PLAN: Interventional Radiology will follow the patient as an inpatient. The Interventional Radiology Portal Hypertension Clinic will provide close follow-up after discharge. Ruthann Cancer, MD [facility] Radiology Electronically Signed   By: Ruthann Cancer M.D.   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 EXAM: 1. Ultrasound-guided  paracentesis 2. Ultrasound-guided access of the right internal jugular vein 3. Ultrasound-guided access of the right common femoral vein 4. Hepatic venogram 5. Intravascular ultrasound 6. Catheterization of the portal vein 7. Portal venous and central manometry 8. Portal venogram 9. Creation of a transhepatic portal vein to hepatic vein shunt 10. Coil embolization of ectopic varix arising from the splenic vein MEDICATIONS: The patient was receiving intravenous antibiotics as an inpatient. ANESTHESIA/SEDATION: General - as administered by the Anesthesia department CONTRAST:  Eighty ML Omnipaque 300, intravenous FLUOROSCOPY TIME:  Fluoroscopy Time: 35.9 minutes (939 mGy). COMPLICATIONS: None immediate. PROCEDURE: Informed written consent was obtained from the patient after a thorough discussion of the procedural risks, benefits and alternatives. All questions were addressed. Maximal Sterile Barrier Technique was utilized including caps, mask, sterile gowns, sterile gloves, sterile drape, hand hygiene and skin antiseptic. A timeout was performed prior to the initiation of the procedure. The procedure was performed with the assistance of my partner, Dr. Ulice Dash  Watts. Preprocedure ultrasound evaluation demonstrated large volume ascites in the right lower quadrant. The procedure was planned. A small skin nick was made. Under direct ultrasound visualization, a 6 French Safe-T-Centesis needle was directed into the ascites. A total of 3.9 L of translucent, straw-colored fluid was removed throughout the course of the procedure. After drainage, the catheter was removed and a sterile bandage was placed. A preliminary ultrasound of the right groin was performed and demonstrates a patent right common femoral vein. A permanent ultrasound image was recorded. Using a combination of fluoroscopy and ultrasound, an access site was determined. A small dermatotomy was made at the planned puncture site. Using ultrasound guidance, access into  the right common femoral vein was obtained with visualization of needle entry into the vessel using a standard micropuncture technique. A wire was advanced into the IVC insert all fascial dilation performed. An 8 [place], 11 cm vascular sheath was placed into the external iliac vein. Through this access site, an 29 [place] ICE catheter was advanced with ease under fluoroscopic guidance to the level of the intrahepatic inferior vena cava. A preliminary ultrasound of the right neck was performed and demonstrates a patent internal jugular vein. A permanent ultrasound image was recorded. Using a combination of fluoroscopy and ultrasound, an access site was determined. A small dermatotomy was made at the planned puncture site. Using ultrasound guidance, access into the right internal jugular vein was obtained with visualization of needle entry into the vessel using a standard micropuncture technique. A wire was advanced into the IVC and serial fascial dilation performed. A 10 French tips sheath was placed into the internal jugular vein and advanced to the IVC. The jugular sheath was retracted into the right atrium and manometry was performed. A 5 French angled tip catheter was then directed into the middle hepatic vein. Hepatic venogram was performed. These images demonstrated patent hepatic vein with no stenosis. The catheter was advanced to a wedge portion of the a patent vein over which the 10 French sheath was advanced into the middle hepatic vein. Using ICE ultrasound visualization the catheter as middle hepatic vein as well as the portal anatomy was defined. A planned exit site from the hepatic vein and puncture site from the portal vein was placed into a single sonographic plane. Under direct ultrasound visualization, the ScorpionX needle was advanced into the central right portal vein. Hand injection of contrast confirmed position within the portal system. A Glidewire Advantage was then advanced into the  splenic vein. A 5 French marking pigtail catheter was then advanced over the wire into the main portal vein and wire removed. Portal venogram was performed which demonstrated a patent portal vein. There was a large varix arising from the splenic hilum which drained along the left flank and distributed into the left pelvic/left flank wound. Outflow is visualized within the left iliac vein. 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 Portal manometry was then performed. The tract was then dilated to 8 mm with an 8 mm x 8 cm Athletis balloon. A 8-10 mm by 7 + 2 mm of Viatorr endograft was placed. No post deployment balloon molding was performed. Next, the splenic vein was again cannulated and the large ectopic varix was selected. An assortment of 0.035"Azur coils were deployed. After coil deployment, there is persistent contrast flow through the varix. Therefore, a thick Gel-Foam slurry was mixed and administered in small aliquots under direct fluoroscopic visualization near the splenic aspect of the coil pack. There  is no evidence of Gel-Foam slurry passing through the coil pack. After injection of a proximally 5 cc, there was complete embolization of the ectopic varix. Completion portogram was performed which demonstrated a patent indwelling tips stent with brisk antegrade flow. In general, there was improved hepatopetal flow throughout the portal system. After placement of the shunt, right atrial and portal pressures were repeated. The catheters and sheath were removed and manual compression was applied to the right internal jugular and right common femoral venous access sites until hemostasis was achieved. The patient was transferred to the PACU in stable condition. Pre-TIPS Mean Pressures (mmHg): Right atrium: 6 Portal vein: 24 Portosystemic gradient: 18 Post-TIPS Mean Pressures (mmHg): Right atrium:15 Portal vein: 23 Portosystemic gradient: 8 IMPRESSION: 1. Large ectopic varix  arising from the splenic vein resulting in portosystemic shunt via the left iliac vein. 2. Successful transjugular portosystemic shunt creation. 3. Successful coil embolization of ectopic varix/portosystemic shunt arising from the splenic vein. 4. Portosystemic gradient of 18 mm Hg (absolute portal venous pressure 24 mm Hg) before shunt placement and 8 mm Hg (absolute portal venous pressure 23 mm Hg) after shunt placement and coil embolization of ectopic varix. PLAN: Interventional Radiology will follow the patient as an inpatient. The Interventional Radiology Portal Hypertension Clinic will provide close follow-up after discharge. Ruthann Cancer, MD [facility] Radiology Electronically Signed   By: Ruthann Cancer M.D.   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 EXAM: 1. Ultrasound-guided paracentesis 2. Ultrasound-guided access of the right internal jugular vein 3. Ultrasound-guided access of the right common femoral vein 4. Hepatic venogram 5. Intravascular ultrasound 6. Catheterization of the portal vein 7. Portal venous and central manometry 8. Portal venogram 9. Creation of a transhepatic portal vein to hepatic vein shunt 10. Coil embolization of ectopic varix arising from the splenic vein MEDICATIONS: The patient was receiving intravenous antibiotics as an inpatient. ANESTHESIA/SEDATION: General - as administered by the Anesthesia department CONTRAST:  Eighty ML Omnipaque 300, intravenous FLUOROSCOPY TIME:  Fluoroscopy Time: 35.9 minutes (939 mGy). COMPLICATIONS: None immediate. PROCEDURE: Informed written consent was obtained from the patient after a thorough discussion of the  procedural risks, benefits and alternatives. All questions were addressed. Maximal Sterile Barrier Technique was utilized including caps, mask, sterile gowns, sterile gloves, sterile drape, hand hygiene and skin antiseptic. A timeout was performed prior to the initiation of the procedure. The procedure was performed with the assistance of my partner, Dr. Ronny Bacon. Preprocedure ultrasound evaluation demonstrated large volume ascites in the right lower quadrant. The procedure was planned. A small skin nick was made. Under direct ultrasound visualization, a 6 French Safe-T-Centesis needle was directed into the ascites. A total of 3.9 L of translucent, straw-colored fluid was removed throughout the course of the procedure. After drainage, the catheter was removed and a sterile bandage was placed. A preliminary ultrasound of the right groin was performed and demonstrates a patent right common femoral vein. A permanent ultrasound image was recorded. Using a combination of fluoroscopy and ultrasound, an access site was determined. A small dermatotomy was made at the planned puncture site. Using ultrasound guidance, access into the right common femoral vein was obtained with visualization of needle entry into the vessel using a standard  micropuncture technique. A wire was advanced into the IVC insert all fascial dilation performed. An 8 [place], 11 cm vascular sheath was placed into the external iliac vein. Through this access site, an 67 [place] ICE catheter was advanced with ease under fluoroscopic guidance to the level of the intrahepatic inferior vena cava. A preliminary ultrasound of the right neck was performed and demonstrates a patent internal jugular vein. A permanent ultrasound image was recorded. Using a combination of fluoroscopy and ultrasound, an access site was determined. A small dermatotomy was made at the planned puncture site. Using ultrasound guidance, access into the right internal jugular vein  was obtained with visualization of needle entry into the vessel using a standard micropuncture technique. A wire was advanced into the IVC and serial fascial dilation performed. A 10 French tips sheath was placed into the internal jugular vein and advanced to the IVC. The jugular sheath was retracted into the right atrium and manometry was performed. A 5 French angled tip catheter was then directed into the middle hepatic vein. Hepatic venogram was performed. These images demonstrated patent hepatic vein with no stenosis. The catheter was advanced to a wedge portion of the a patent vein over which the 10 French sheath was advanced into the middle hepatic vein. Using ICE ultrasound visualization the catheter as middle hepatic vein as well as the portal anatomy was defined. A planned exit site from the hepatic vein and puncture site from the portal vein was placed into a single sonographic plane. Under direct ultrasound visualization, the ScorpionX needle was advanced into the central right portal vein. Hand injection of contrast confirmed position within the portal system. A Glidewire Advantage was then advanced into the splenic vein. A 5 French marking pigtail catheter was then advanced over the wire into the main portal vein and wire removed. Portal venogram was performed which demonstrated a patent portal vein. There was a large varix arising from the splenic hilum which drained along the left flank and distributed into the left pelvic/left flank wound. Outflow is visualized within the left iliac vein. 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 Portal manometry was then performed. The tract was then dilated to 8 mm with an 8 mm x 8 cm Athletis balloon. A 8-10 mm by 7 + 2 mm of Viatorr endograft was placed. No post deployment balloon molding was performed. Next, the splenic vein was again cannulated and the large ectopic varix was selected. An assortment of 0.035"Azur coils were  deployed. After coil deployment, there is persistent contrast flow through the varix. Therefore, a thick Gel-Foam slurry was mixed and administered in small aliquots under direct fluoroscopic visualization near the splenic aspect of the coil pack. There is no evidence of Gel-Foam slurry passing through the coil pack. After injection of a proximally 5 cc, there was complete embolization of the ectopic varix. Completion portogram was performed which demonstrated a patent indwelling tips stent with brisk antegrade flow. In general, there was improved hepatopetal flow throughout the portal system. After placement of the shunt, right atrial and portal pressures were repeated. The catheters and sheath were removed and manual compression was applied to the right internal jugular and right common femoral venous access sites until hemostasis was achieved. The patient was transferred to the PACU in stable condition. Pre-TIPS Mean Pressures (mmHg): Right atrium: 6 Portal vein: 24 Portosystemic gradient: 18 Post-TIPS Mean Pressures (mmHg): Right atrium:15 Portal vein: 23 Portosystemic gradient: 8 IMPRESSION: 1. 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 2. Successful transjugular portosystemic shunt creation. 3. Successful coil embolization of ectopic varix/portosystemic shunt arising from the splenic vein. 4. Portosystemic gradient of 18 mm Hg (absolute portal venous pressure 24 mm Hg) before shunt placement and 8 mm Hg (absolute portal venous pressure 23 mm Hg) after shunt placement and coil embolization of ectopic varix. PLAN: Interventional Radiology will follow the patient as an inpatient. The Interventional Radiology Portal Hypertension Clinic will provide close follow-up after discharge. Ruthann Cancer, MD [facility] Radiology Electronically Signed   By: Ruthann Cancer M.D.   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 EXAM: 1. Ultrasound-guided paracentesis 2. Ultrasound-guided access of the right internal jugular vein 3. Ultrasound-guided access of the right common femoral vein 4. Hepatic venogram 5. Intravascular ultrasound 6. Catheterization of the portal vein 7. Portal venous and central manometry 8. Portal venogram 9. Creation of a transhepatic portal vein to hepatic vein shunt 10. Coil embolization of ectopic varix arising from the splenic vein MEDICATIONS: The patient was receiving intravenous antibiotics as an inpatient. ANESTHESIA/SEDATION: General - as administered by the Anesthesia department CONTRAST:  Eighty ML Omnipaque 300, intravenous FLUOROSCOPY TIME:  Fluoroscopy Time: 35.9 minutes (939 mGy). COMPLICATIONS: None immediate. PROCEDURE: Informed written consent was obtained from the patient after a thorough discussion of the procedural risks, benefits and alternatives. All questions were addressed. Maximal Sterile Barrier Technique was utilized including caps, mask, sterile gowns, sterile gloves, sterile drape, hand hygiene and skin antiseptic. A timeout was performed prior to the initiation of the procedure. The procedure was performed with the assistance of my partner, Dr. Ronny Bacon. Preprocedure ultrasound evaluation demonstrated large volume ascites in the right lower quadrant. The procedure was planned. A small skin nick was made. Under direct ultrasound visualization, a 6 French Safe-T-Centesis needle was directed into the ascites. A total of 3.9 L of translucent, straw-colored fluid was removed throughout the course of the procedure. After drainage, the catheter was removed and a  sterile bandage was placed. A preliminary ultrasound of the right groin was performed and demonstrates a patent right common femoral vein. A permanent ultrasound image was recorded. Using a combination of fluoroscopy and ultrasound, an access site was determined. A small dermatotomy was made at the planned puncture site. Using ultrasound guidance, access into the right common femoral vein was obtained with visualization of needle entry into the vessel using a standard micropuncture technique. A wire was advanced into the IVC insert all fascial dilation performed. An 8 [place], 11 cm vascular sheath was placed into the external iliac vein. Through this access site, an 70 [place] ICE catheter was advanced with ease under fluoroscopic guidance to the level of the intrahepatic inferior vena cava. A preliminary ultrasound of the right neck was performed and demonstrates a patent internal jugular vein. A permanent ultrasound image was recorded. Using a combination of fluoroscopy and ultrasound, an access site was determined. A small dermatotomy was made at the planned puncture site. Using ultrasound guidance, access into the right internal jugular vein was obtained with visualization of needle entry into the vessel using a standard micropuncture technique. A wire was  advanced into the IVC and serial fascial dilation performed. A 10 French tips sheath was placed into the internal jugular vein and advanced to the IVC. The jugular sheath was retracted into the right atrium and manometry was performed. A 5 French angled tip catheter was then directed into the middle hepatic vein. Hepatic venogram was performed. These images demonstrated patent hepatic vein with no stenosis. The catheter was advanced to a wedge portion of the a patent vein over which the 10 French sheath was advanced into the middle hepatic vein. Using ICE ultrasound visualization the catheter as middle hepatic vein as well as the portal anatomy was  defined. A planned exit site from the hepatic vein and puncture site from the portal vein was placed into a single sonographic plane. Under direct ultrasound visualization, the ScorpionX needle was advanced into the central right portal vein. Hand injection of contrast confirmed position within the portal system. A Glidewire Advantage was then advanced into the splenic vein. A 5 French marking pigtail catheter was then advanced over the wire into the main portal vein and wire removed. Portal venogram was performed which demonstrated a patent portal vein. There was a large varix arising from the splenic hilum which drained along the left flank and distributed into the left pelvic/left flank wound. Outflow is visualized within the left iliac vein. 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 Portal manometry was then performed. The tract was then dilated to 8 mm with an 8 mm x 8 cm Athletis balloon. A 8-10 mm by 7 + 2 mm of Viatorr endograft was placed. No post deployment balloon molding was performed. Next, the splenic vein was again cannulated and the large ectopic varix was selected. An assortment of 0.035"Azur coils were deployed. After coil deployment, there is persistent contrast flow through the varix. Therefore, a thick Gel-Foam slurry was mixed and administered in small aliquots under direct fluoroscopic visualization near the splenic aspect of the coil pack. There is no evidence of Gel-Foam slurry passing through the coil pack. After injection of a proximally 5 cc, there was complete embolization of the ectopic varix. Completion portogram was performed which demonstrated a patent indwelling tips stent with brisk antegrade flow. In general, there was improved hepatopetal flow throughout the portal system. After placement of the shunt, right atrial and portal pressures were repeated. The catheters and sheath were removed and manual compression was applied to the right internal  jugular and right common femoral venous access sites until hemostasis was achieved. The patient was transferred to the PACU in stable condition. Pre-TIPS Mean Pressures (mmHg): Right atrium: 6 Portal vein: 24 Portosystemic gradient: 18 Post-TIPS Mean Pressures (mmHg): Right atrium:15 Portal vein: 23 Portosystemic gradient: 8 IMPRESSION: 1. 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 2. Successful transjugular portosystemic shunt creation. 3. Successful coil embolization of ectopic varix/portosystemic shunt arising from the splenic vein. 4. Portosystemic gradient of 18 mm Hg (absolute portal venous pressure 24 mm Hg) before shunt placement and 8 mm Hg (absolute portal venous pressure 23 mm Hg) after shunt placement and coil embolization of ectopic varix. PLAN: Interventional Radiology will follow the patient as an inpatient. The Interventional Radiology Portal Hypertension Clinic will provide close follow-up after discharge. Ruthann Cancer, MD [facility] Radiology Electronically Signed   By: Ruthann Cancer M.D.   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 EXAM: 1. Ultrasound-guided paracentesis 2. Ultrasound-guided access of the right internal jugular vein 3. Ultrasound-guided access of the right common femoral vein 4. Hepatic venogram 5. Intravascular ultrasound 6. Catheterization of the portal vein 7. Portal venous and central manometry 8. Portal venogram 9. Creation of a transhepatic portal vein to hepatic vein shunt 10. Coil embolization of ectopic varix arising from the splenic vein MEDICATIONS: The  patient was receiving intravenous antibiotics as an inpatient. ANESTHESIA/SEDATION: General - as administered by the Anesthesia department CONTRAST:  Eighty ML Omnipaque 300, intravenous FLUOROSCOPY TIME:  Fluoroscopy Time: 35.9 minutes (939 mGy). COMPLICATIONS: None immediate. PROCEDURE: Informed written consent was obtained from the patient after a thorough discussion of the procedural risks, benefits and alternatives. All questions were addressed. Maximal Sterile Barrier Technique was utilized including caps, mask, sterile gowns, sterile gloves, sterile drape, hand hygiene and skin antiseptic. A timeout was performed prior to the initiation of the procedure. The procedure was performed with the assistance of my partner, Dr. Ronny Bacon. Preprocedure ultrasound evaluation demonstrated large volume ascites in the right lower quadrant. The procedure was planned. A small skin nick was made. Under direct ultrasound visualization, a 6 French Safe-T-Centesis needle was directed into the ascites. A total of 3.9 L of translucent, straw-colored fluid was removed throughout the course of the procedure. After drainage, the catheter was removed and a sterile bandage was placed. A preliminary ultrasound of the right groin was performed and demonstrates a patent right common femoral vein. A permanent ultrasound image was recorded. Using a combination of fluoroscopy and ultrasound, an access site was determined. A small dermatotomy was made at the planned puncture site. Using ultrasound guidance, access into the right common femoral vein was obtained with visualization of needle entry into the vessel using a standard micropuncture technique. A wire was advanced into the IVC insert all fascial dilation performed. An 8 [place], 11 cm vascular sheath was placed into the external iliac vein. Through this access site, an 62 [place] ICE catheter was advanced with ease under fluoroscopic guidance to the level of the intrahepatic  inferior vena cava. A preliminary ultrasound of the right neck was performed and demonstrates a patent internal jugular vein. A permanent ultrasound image was recorded. Using a combination of fluoroscopy and ultrasound, an access site was determined. A small dermatotomy was made at the planned puncture site. Using ultrasound guidance, access into the right internal jugular vein was obtained with visualization of needle entry into the vessel using a standard micropuncture technique. A wire was advanced into the IVC and serial fascial dilation performed. A 10 French tips sheath was placed into the internal jugular vein and advanced to the IVC. The jugular sheath was retracted into the right atrium and manometry was performed. A 5 French angled tip catheter was then directed into the middle hepatic vein. Hepatic venogram was performed. These images demonstrated patent hepatic vein with no stenosis. The catheter was advanced to a wedge portion of the a patent vein over which the 10 French sheath was advanced into the middle hepatic vein. Using ICE ultrasound visualization the catheter as middle hepatic vein as well as the portal anatomy was defined. A planned exit site from the hepatic vein and puncture site from the portal vein was placed into a single sonographic plane.  Under direct ultrasound visualization, the ScorpionX needle was advanced into the central right portal vein. Hand injection of contrast confirmed position within the portal system. A Glidewire Advantage was then advanced into the splenic vein. A 5 French marking pigtail catheter was then advanced over the wire into the main portal vein and wire removed. Portal venogram was performed which demonstrated a patent portal vein. There was a large varix arising from the splenic hilum which drained along the left flank and distributed into the left pelvic/left flank wound. Outflow is visualized within the left iliac vein. This finding was compatible with an  ectopic variceal portosystemic shunt. Portal manometry was then performed. The tract was then dilated to 8 mm with an 8 mm x 8 cm Athletis balloon. A 8-10 mm by 7 + 2 mm of Viatorr endograft was placed. No post deployment balloon molding was performed. Next, the splenic vein was again cannulated and the large ectopic varix was selected. An assortment of 0.035"Azur coils were deployed. After coil deployment, there is persistent contrast flow through the varix. Therefore, a thick Gel-Foam slurry was mixed and administered in small aliquots under direct fluoroscopic visualization near the splenic aspect of the coil pack. There is no evidence of Gel-Foam slurry passing through the coil pack. After injection of a proximally 5 cc, there was complete embolization of the ectopic varix. Completion portogram was performed which demonstrated a patent indwelling tips stent with brisk antegrade flow. In general, there was improved hepatopetal flow throughout the portal system. After placement of the shunt, right atrial and portal pressures were repeated. The catheters and sheath were removed and manual compression was applied to the right internal jugular and right common femoral venous access sites until hemostasis was achieved. The patient was transferred to the PACU in stable condition. Pre-TIPS Mean Pressures (mmHg): Right atrium: 6 Portal vein: 24 Portosystemic gradient: 18 Post-TIPS Mean Pressures (mmHg): Right atrium:15 Portal vein: 23 Portosystemic gradient: 8 IMPRESSION: 1. 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 2. Successful transjugular portosystemic shunt creation. 3. Successful coil embolization of ectopic varix/portosystemic shunt arising from the splenic vein. 4. Portosystemic gradient of 18 mm Hg (absolute portal venous pressure 24 mm Hg) before shunt placement and 8 mm Hg (absolute portal venous pressure 23 mm Hg) after shunt placement and coil  embolization of ectopic varix. PLAN: Interventional Radiology will follow the patient as an inpatient. The Interventional Radiology Portal Hypertension Clinic will provide close follow-up after discharge. Ruthann Cancer, MD [facility] Radiology Electronically Signed   By: Ruthann Cancer M.D.   On: 05/04/2022 15:50   US THORACENTESIS ASP PLEURAL SPACE W/IMG GUIDE  Result Date: 04/23/2022 INDICATION: Patient with history of cirrhosis, hepatic hydrothorax, ascites; request received for therapeutic right thoracentesis. EXAM: ULTRASOUND GUIDED THERAPEUTIC RIGHT THORACENTESIS MEDICATIONS: 10 mL 1% lidocaine COMPLICATIONS: None immediate. PROCEDURE: An ultrasound guided thoracentesis was thoroughly discussed with the patient and questions answered. The benefits, risks, alternatives and complications were also discussed. The patient understands and wishes to proceed with the procedure. Written consent was obtained. Ultrasound was performed to localize and mark an adequate pocket of fluid in the right chest. The area was then prepped and draped in the normal sterile fashion. 1% Lidocaine was used for local anesthesia. Under ultrasound guidance a 6 Fr Safe-T-Centesis catheter was introduced. Thoracentesis was performed. The catheter was removed and a dressing applied. FINDINGS: A total of approximately 1 liter of golden yellow  fluid was removed. IMPRESSION: Successful ultrasound guided therapeutic right thoracentesis yielding 1 liter of pleural fluid. Read by: Rowe Robert, PA-C Electronically Signed   By: Ruthann Cancer M.D.   On: 04/23/2022 12:12   DG Chest 1 View  Result Date: 04/23/2022 CLINICAL DATA:  [id number]; right thoracentesis yielding 1 L of golden yellow fluid EXAM: CHEST  1 VIEW COMPARISON:  May 09, 2022 FINDINGS: The heart size and mediastinal contours are within normal limits. Low lung volumes. Both lungs are clear. No right pneumothorax. The visualized skeletal  structures are unremarkable. IMPRESSION: Low lung volumes.  No right pneumothorax.  Lungs are clear. Electronically Signed   By: Frazier Richards M.D.   On: 04/23/2022 12:05    Labs:  CBC: Recent Labs    05/01/2022 0325 05/08/2022 1346 04/25/22 0119 04/26/22 0248 04/27/22 0119  WBC 8.0  --  6.0 6.8 6.2  HGB 11.2* 8.2* 9.3* 9.7* 10.0*  HCT 33.1* 24.0* 27.8* 28.2* 28.4*  PLT 87*  --  72* 66* 70*     COAGS: Recent Labs    05/27/21 1634 11/07/21 1241 12/10/21 1245 12/23/21 1208 03/17/22 1147 05/08/2022 1755 04/20/22 0812 04/21/22 0910 04/19/2022 0325 04/25/22 0119  INR 1.5*   < > 1.6* 1.7* 1.7*   < > 2.5* 2.3* 2.1* 2.2*  APTT 31  --  35 34 33  --   --   --   --   --    < > = values in this interval not displayed.     BMP: Recent Labs    04/19/2022 0758 05/01/2022 1346 04/25/22 0119 04/26/22 0339 04/27/22 0119  NA 129* 133* 134* 136 135  K 5.2* 5.1 4.4 4.8 4.5  CL 101  --  108 107 106  CO2 19*  --  19* 21* 20*  GLUCOSE 113*  --  164* 114* 125*  BUN 20  --  17 16 15   CALCIUM 9.0  --  9.0 9.1 9.0  CREATININE 1.02  --  1.12 1.13 0.99  GFRNONAA >60  --  >60 >60 >60     LIVER FUNCTION TESTS: Recent Labs    05/17/2022 0325 04/25/22 0119 04/26/22 0339 04/27/22 0119  BILITOT 3.8* 4.7* 5.7* 7.7*  AST 33 60* 68* 55*  ALT 34 39 47* 45*  ALKPHOS 58 64 98 104  PROT 5.1* 4.4* 4.5* 4.3*  ALBUMIN 3.2* 3.0* 2.9* 2.8*     Assessment and Plan:  76 y.o. 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 Serafina Royals.  Patient lethargic, he was doing well on 7/8 but became lethargic and confused.  Had minimal BM despite oral Lactulose, has been receiving Lactulose enema.  Ammonia trending down, 95 today (194 yesterday) and mentation  improving per daughter.   VSS Hgb stable  LFT stable, trending down. Puncture sites clean, dry, non tender,  ecchymosis on R IJ and R groin puncture sites.   Further treatment plan per TRH/ GI Appreciate and agree with the plan.  IR to follow.    Electronically Signed: Tera Mater, PA-C 04/27/2022, 10:53 AM   I spent a total of 15 Minutes at the the patient's bedside AND on the patient's hospital floor or unit, greater than 50% of which was counseling/coordinating care for s/p TIPS and ectopic variceal coil embolization on 7/7  This chart was dictated using voice recognition software.  Despite best efforts to proofread,  errors can occur which can change the documentation meaning.

## 2022-04-27 NOTE — Progress Notes (Signed)
[facility] Progress Note  Patient: Peter Nichols     DHR:[id number]  DOA: 05/10/2022   PCP: [name], MD       Brief hospital course: This is a 76 year old male with hypertension, neuromuscular disorder, anemia, alcoholic cirrhosis, hepatic encephalopathy and recurrent ascites and hepatic hydrothoraxchronic thrombocytopenia.  TIPS was attempted on 5/30 but aborted secondary to increased right atrial pressure.  [facility] turned him down for liver transplant due to age. He was admitted to the hospital on 7/1 for weakness, found to have a lactic acid of 4.3 and given IV fluids.  Critical care was consulted on 7/2.  He underwent a paracentesis on 7/2 of 3 liters. He was evaluated by [facility] on 7/3 decided to reattempt a TIPS on 7/7. 7/6, he underwent 1 L thoracentesis from the right pleural cavity. On 7/7, he was transferred from [facility] to [facility] and received a TIPS.  Subjective:  He is unable to give a history and is still quite somnolent. His wife stayed overnight and states the NG was turned on to suction around 6 AM and he is beginning to wake up more. On my exam, he does not recognize his wife.  Assessment and Plan: Principal Problem:   Decompensated alcoholic hepatic cirrhosis, acute metabolic encephalopathy, refractory ascites and hepatic hydrothorax -Status post TIPS on 7/7-IR and GI following -[facility] declined him and GI has documented that he is likely not a candidate for transplant due to his age -Propranolol, furosemide and spironolactone are on hold and he has been receiving 50 cc/ hour of IVF since 7/2. - urine output not charted yesterday  RN states he urinated multiple times and was incontinent at occassions   Active problems: Acute hepatic encephalopathy -7/9> Lethargic despite taking TID Lactulose and Rifaximin- no BMs in 48 hrs-   lactulose enema given- Xray showed ileus- NG ordered- Dulcolax and then Molasses enema ordered per GI  recommendations - 7/10- has had a few small BMs, slightly more awake but still confused- I ordered another lactulose enema around 9 AM-  cont TID until alert    Active Problems: Lactic acidosis - Resolved after IV fluids given  Hyperkalemia - Resolved  Macrocytic anemia, thrombocytopenia - Follow intermittently      DVT prophylaxis:  SCDs Start: 04/22/2022 2044     Code Status: Full Code  Consultants: GI, IR Level of Care: Level of care: Progressive Disposition Plan:  Status is: Inpatient Remains inpatient appropriate because: Status post TIPS -encephalopathic with ileus  Objective:   Vitals:   04/26/22 2011 04/27/22 0000 04/27/22 0358 04/27/22 0730  BP: 104/62 (!) 111/56 (!) 124/56 109/60  Pulse: (!) 104 96 83 83  Resp: 19 15 18 14   Temp:  97.7 F (36.5 C) 97.6 F (36.4 C) 97.6 F (36.4 C)  TempSrc:  Oral Oral Oral  SpO2: 94% 96% 94% 97%  Weight:      Height:       Filed Weights   05/18/2022 1411 05/08/2022 2138  Weight: 71.2 kg 73.3 kg   Exam: General exam: Appears comfortable  HEENT: PERRLA, oral mucosa moist, no sclera icterus or thrush Respiratory system: Clear to auscultation. Respiratory effort normal. Cardiovascular system: S1 & S2 heard, regular rate and rhythm Gastrointestinal system: Abdomen soft, non-tender, distended. Has some bowel sounds today Central nervous system:somnolent- cannot assess Extremities: No cyanosis, clubbing or edema Skin: extensive bruising    Imaging and lab data was personally reviewed    CBC: Recent Labs  Lab 04/23/22 0248 04/29/2022 0325 05/17/2022 1346 04/25/22 0119 04/26/22 0248 04/27/22 0119  WBC 7.1 8.0  --  6.0 6.8 6.2  NEUTROABS 4.8 5.3  --  4.4 4.6 4.4  HGB 11.0* 11.2* 8.2* 9.3* 9.7* 10.0*  HCT 33.2* 33.1* 24.0* 27.8* 28.2* 28.4*  MCV 106.1* 103.8*  --  103.7* 102.2* 101.8*  PLT 82* 87*  --  72* 66* 70*    Basic Metabolic Panel: Recent Labs  Lab 04/23/22 0248 05/13/2022 0325 05/02/2022 0758 05/14/2022 1346  04/25/22 0119 04/26/22 0339 04/27/22 0119  NA 134* 133* 129* 133* 134* 136 135  K 5.1 5.5* 5.2* 5.1 4.4 4.8 4.5  CL 104 104 101  --  108 107 106  CO2 21* 20* 19*  --  19* 21* 20*  GLUCOSE 111* 127* 113*  --  164* 114* 125*  BUN 16 21 20   --  17 16 15   CREATININE 0.98 1.04 1.02  --  1.12 1.13 0.99  CALCIUM 9.1 9.4 9.0  --  9.0 9.1 9.0  MG 2.1  --   --   --   --   --   --     GFR: Estimated Creatinine Clearance: 64.5 mL/min (by C-G formula based on SCr of 0.99 mg/dL).  Scheduled Meds:  Chlorhexidine Gluconate Cloth  6 each Topical Daily   pantoprazole (PROTONIX) IV  40 mg Intravenous Q12H   Continuous Infusions:  sodium chloride Stopped (05/17/2022 1507)   lactated ringers 50 mL/hr at 04/26/22 1848     LOS: 8 days   Author: Debbe Odea  04/27/2022 10:47 AM

## 2022-04-28 ENCOUNTER — Ambulatory Visit: Payer: Medicare HMO | Admitting: Cardiology

## 2022-04-28 DIAGNOSIS — K7290 Hepatic failure, unspecified without coma: Secondary | ICD-10-CM

## 2022-04-28 DIAGNOSIS — D696 Thrombocytopenia, unspecified: Secondary | ICD-10-CM | POA: Diagnosis not present

## 2022-04-28 DIAGNOSIS — K7460 Unspecified cirrhosis of liver: Secondary | ICD-10-CM

## 2022-04-28 LAB — CBC WITH DIFFERENTIAL/PLATELET
Abs Immature Granulocytes: 0.06 10*3/uL (ref 0.00–0.07)
Basophils Absolute: 0.1 10*3/uL (ref 0.0–0.1)
Basophils Relative: 2 %
Eosinophils Absolute: 0.5 10*3/uL (ref 0.0–0.5)
Eosinophils Relative: 7 %
HCT: 29.1 % — ABNORMAL LOW (ref 39.0–52.0)
Hemoglobin: 10.2 g/dL — ABNORMAL LOW (ref 13.0–17.0)
Immature Granulocytes: 1 %
Lymphocytes Relative: 9 %
Lymphs Abs: 0.6 10*3/uL — ABNORMAL LOW (ref 0.7–4.0)
MCH: 35.2 pg — ABNORMAL HIGH (ref 26.0–34.0)
MCHC: 35.1 g/dL (ref 30.0–36.0)
MCV: 100.3 fL — ABNORMAL HIGH (ref 80.0–100.0)
Monocytes Absolute: 0.8 10*3/uL (ref 0.1–1.0)
Monocytes Relative: 12 %
Neutro Abs: 4.4 10*3/uL (ref 1.7–7.7)
Neutrophils Relative %: 69 %
Platelets: 74 10*3/uL — ABNORMAL LOW (ref 150–400)
RBC: 2.9 MIL/uL — ABNORMAL LOW (ref 4.22–5.81)
RDW: 15.3 % (ref 11.5–15.5)
WBC: 6.4 10*3/uL (ref 4.0–10.5)
nRBC: 0 % (ref 0.0–0.2)

## 2022-04-28 MED ORDER — PANTOPRAZOLE SODIUM 40 MG PO TBEC
40.0000 mg | DELAYED_RELEASE_TABLET | Freq: Two times a day (BID) | ORAL | Status: DC
Start: 2022-04-28 — End: 2022-05-01
  Administered 2022-04-28 – 2022-04-30 (×5): 40 mg via ORAL
  Filled 2022-04-28 (×5): qty 1

## 2022-04-28 MED ORDER — RIFAXIMIN 550 MG PO TABS
550.0000 mg | ORAL_TABLET | Freq: Two times a day (BID) | ORAL | Status: DC
  Administered 2022-04-28 – 2022-05-04 (×11): 550 mg via ORAL
  Filled 2022-04-28 (×13): qty 1

## 2022-04-28 NOTE — Progress Notes (Signed)
Inpatient Rehab Admissions Coordinator:   Per PT recommendation, patient was screened for CIR candidacy by Clemens Catholic, MS, CCC-SLP. At this time, Pt. does not appear to demonstrate medical necessity sufficient to gain approval from Pt.'s medicare advantage plan. I  will not pursue a rehab consult for this Pt.   Recommend other rehab venues to be pursued.  Please contact me with any questions.  Clemens Catholic, [place], [place] Admissions Coordinator  [phone] ([place]) [phone] (office)

## 2022-04-28 NOTE — Progress Notes (Signed)
The patient is receiving Protonix by the intravenous route since 04/27/2022.  Based on criteria approved by the Pharmacy and [place], the medication is being converted to the equivalent oral dose form.  These criteria include: -No active GI bleeding -Able to tolerate diet of full liquids (or better) or tube feeding -Able to tolerate other medications by the oral or enteral route  If you have any questions about this conversion, please contact the Pharmacy Department  Thank you.  Nicole Cella, RPh Clinical Pharmacist [phone] Please check AMION for all [facility] Pharmacy phone numbers After 10:00 PM, call [place] [phone]

## 2022-04-28 NOTE — Progress Notes (Signed)
[facility] Progress Note  Patient: Peter Nichols     QIW:[id number]  DOA: 05/14/2022   PCP: [name], MD       Brief hospital course: This is a 76 year old male with hypertension, neuromuscular disorder, anemia, alcoholic cirrhosis, hepatic encephalopathy and recurrent ascites and hepatic hydrothoraxchronic thrombocytopenia.  TIPS was attempted on 5/30 but aborted secondary to increased right atrial pressure.  [facility] turned him down for liver transplant due to age. He was admitted to the hospital on 7/1 for weakness, found to have a lactic acid of 4.3 and given IV fluids.  Critical care was consulted on 7/2.  He underwent a paracentesis on 7/2 of 3 liters. He was evaluated by [facility] on 7/3 decided to reattempt a TIPS on 7/7. 7/6, he underwent 1 L thoracentesis from the right pleural cavity. On 7/7, he was transferred from [facility] to [facility] and received a TIPS.  Subjective:  He is alert today and has no complaints. He dos not complain of pain, nausea or vomiting.  Assessment and Plan: Principal Problem:   Decompensated alcoholic hepatic cirrhosis, acute metabolic encephalopathy, refractory ascites and hepatic hydrothorax -Status post TIPS on 7/7-IR and GI following -[facility] declined him and GI has documented that he is likely not a candidate for transplant due to his age -Propranolol, furosemide and spironolactone are on hold and he has been receiving 50 cc/ hour of IVF since 7/2.  - will hold IVF today    Active problems: Acute hepatic encephalopathy and ileus after TIPS -7/9> Lethargic despite taking TID Lactulose and Rifaximin- no BMs in 48 hrs-   lactulose enema given- Xray showed ileus- NG ordered- Dulcolax and then Molasses enema ordered per GI recommendations - 7/10- has had a few small BMs, slightly more awake but still confused- - started on clears and oral lactulose - 7/11- alert and oriented on my exam - remove NG and advance to regular  diet- hold IVF    Active Problems: Lactic acidosis - Resolved after IV fluids given  Hyperkalemia - Resolved  Macrocytic anemia, thrombocytopenia - Follow intermittently      DVT prophylaxis:  SCDs Start: 04/28/2022 2044   Code Status: Full Code  Consultants: GI, IR Level of Care: Level of care: Progressive Disposition Plan:  Status is: Inpatient Remains inpatient appropriate because: Status post TIPS  - advancing to solids- needs SNF - CIR does not feel he is appropriate for their services and is quite weak  Objective:   Vitals:   04/28/22 0417 04/28/22 0815 04/28/22 1140 04/28/22 1551  BP: (!) 110/50 119/60 (!) 104/53 (!) 114/57  Pulse: 86 87 81 81  Resp: 20 20 19 13   Temp: 97.6 F (36.4 C) 97.9 F (36.6 C) 97.8 F (36.6 C) 97.7 F (36.5 C)  TempSrc: Oral Oral Oral Axillary  SpO2: 96% 94% 98% 97%  Weight:      Height:       Filed Weights   04/26/2022 1411 05/16/2022 2138  Weight: 71.2 kg 73.3 kg   Exam: General exam: Appears comfortable  HEENT: PERRLA, oral mucosa moist, no sclera icterus or thrush- NG present Respiratory system: Clear to auscultation. Respiratory effort normal. Cardiovascular system: S1 & S2 heard, regular rate and rhythm Gastrointestinal system: Abdomen soft, non-tender, mildly distended. Normal bowel sounds   Central nervous system: Alert and oriented. No focal neurological deficits. Extremities: No cyanosis, clubbing or edema Skin: extensive bruising Psychiatry:  Mood & affect appropriate.    Imaging and lab  data was personally reviewed    CBC: Recent Labs  Lab 04/27/2022 0325 05/07/2022 1346 04/25/22 0119 04/26/22 0248 04/27/22 0119 04/28/22 0255  WBC 8.0  --  6.0 6.8 6.2 6.4  NEUTROABS 5.3  --  4.4 4.6 4.4 4.4  HGB 11.2* 8.2* 9.3* 9.7* 10.0* 10.2*  HCT 33.1* 24.0* 27.8* 28.2* 28.4* 29.1*  MCV 103.8*  --  103.7* 102.2* 101.8* 100.3*  PLT 87*  --  72* 66* 70* 74*    Basic Metabolic Panel: Recent Labs  Lab 04/23/22 0248  04/23/2022 0325 04/20/2022 0758 05/09/2022 1346 04/25/22 0119 04/26/22 0339 04/27/22 0119  NA 134* 133* 129* 133* 134* 136 135  K 5.1 5.5* 5.2* 5.1 4.4 4.8 4.5  CL 104 104 101  --  108 107 106  CO2 21* 20* 19*  --  19* 21* 20*  GLUCOSE 111* 127* 113*  --  164* 114* 125*  BUN 16 21 20   --  17 16 15   CREATININE 0.98 1.04 1.02  --  1.12 1.13 0.99  CALCIUM 9.1 9.4 9.0  --  9.0 9.1 9.0  MG 2.1  --   --   --   --   --   --     GFR: Estimated Creatinine Clearance: 64.5 mL/min (by C-G formula based on SCr of 0.99 mg/dL).  Scheduled Meds:  Chlorhexidine Gluconate Cloth  6 each Topical Daily   lactulose  30 g Oral TID   pantoprazole  40 mg Oral BID   rifaximin  550 mg Oral BID   Continuous Infusions:  sodium chloride Stopped (05/05/2022 1507)   lactated ringers 50 mL/hr at 04/26/22 1848     LOS: 9 days   Author: Debbe Odea  04/28/2022 4:53 PM

## 2022-04-28 NOTE — Anesthesia Postprocedure Evaluation (Signed)
Anesthesia Post Note  Patient: Peter Nichols  Procedure(s) Performed: TIPS     Patient location during evaluation: PACU Anesthesia Type: General Level of consciousness: awake and alert Pain management: pain level controlled Vital Signs Assessment: post-procedure vital signs reviewed and stable Respiratory status: spontaneous breathing, nonlabored ventilation, respiratory function stable and patient connected to nasal cannula oxygen Cardiovascular status: blood pressure returned to baseline and stable Postop Assessment: no apparent nausea or vomiting Anesthetic complications: no   No notable events documented.  Last Vitals:  Vitals:   04/28/22 0417 04/28/22 0815  BP: (!) 110/50 119/60  Pulse: 86 87  Resp: 20 20  Temp: 36.4 C 36.6 C  SpO2: 96% 94%    Last Pain:  Vitals:   04/28/22 0815  TempSrc: Oral  PainSc:                  Barnet Glasgow

## 2022-04-28 NOTE — NC FL2 (Signed)
[place] LEVEL OF CARE SCREENING TOOL     IDENTIFICATION  Patient Name: Peter Nichols Birthdate: [birth year] Sex: male Admission Date (Current Location): 04/23/2022  [place] and [place] Number:  Herbalist and Address:  [facility], [place] [address], [place], [place] [address]      Provider Number: [id number]  Attending Physician Name and Address:  Debbe Odea, MD  Relative Name and Phone Number:       Current Level of Care: Hospital Recommended Level of Care: [place] Prior Approval Number:    Date Approved/Denied:   PASRR Number: [id number]  Discharge Plan: SNF    Current Diagnoses: Patient Active Problem List   Diagnosis Date Noted   Lactic acidosis 05/18/2022   Generalized weakness 05/08/2022   Decompensated hepatic cirrhosis (HCC)    GERD (gastroesophageal reflux disease)    Ascites of liver 01/01/2022   Anemia, unspecified 01/01/2022   Hyperglycemia 01/01/2022   Hepatic encephalopathy ([place]) 12/31/2021   Pancreatitis, acute 06/01/2021   AKI (acute kidney injury) ([place]) 05/27/2021   Near syncope 05/27/2021   Steatohepatitis, non-alcoholic    Hyperlipidemia    Overweight (BMI 25.0-29.9)    Alcoholic cirrhosis of liver with ascites (HCC)    Acute hepatic encephalopathy (HCC) 05/22/2021   Thrombocytopenia ([place]) 05/04/2017    Orientation RESPIRATION BLADDER Height & Weight     Self, Place  Normal Continent, External catheter Weight: 161 lb 9.6 oz (73.3 kg) Height:  5' 9"  (175.3 cm)  BEHAVIORAL SYMPTOMS/MOOD NEUROLOGICAL BOWEL NUTRITION STATUS      Incontinent Diet (See dc summary)  AMBULATORY STATUS COMMUNICATION OF NEEDS Skin   Limited Assist Verbally Other (Comment), PU Stage and Appropriate Care (puncture on neck, groin, and abdomen; Stage II on buttocks)                       Personal Care Assistance Level of Assistance  Bathing, Feeding, Dressing Bathing Assistance: Maximum  assistance Feeding assistance: Independent Dressing Assistance: Limited assistance     Functional Limitations Info             SPECIAL CARE FACTORS FREQUENCY  PT (By licensed PT), OT (By licensed OT)     PT Frequency: 5x/week OT Frequency: 5x/week            Contractures Contractures Info: Not present    Additional Factors Info  Code Status, Allergies Code Status Info: Full Allergies Info: Ensure           Current Medications (04/28/2022):  This is the current hospital active medication list Current Facility-Administered Medications  Medication Dose Route Frequency Provider Last Rate Last Admin   0.9 %  sodium chloride infusion  250 mL Intravenous Continuous Nita Sells, MD   Stopped at 05/12/2022 1507   acetaminophen (TYLENOL) tablet 650 mg  650 mg Oral Q6H PRN Nita Sells, MD       Or   acetaminophen (TYLENOL) suppository 650 mg  650 mg Rectal Q6H PRN Nita Sells, MD       albuterol (PROVENTIL) (2.5 MG/3ML) 0.083% nebulizer solution 2.5 mg  2.5 mg Nebulization Q2H PRN Nita Sells, MD       Chlorhexidine Gluconate Cloth 2 % PADS 6 each  6 each Topical Daily Nita Sells, MD   6 each at 04/28/22 0814   lactulose (CHRONULAC) 10 GM/15ML solution 30 g  30 g Oral TID Debbe Odea, MD   30 g at 04/28/22 [phone]  ondansetron (ZOFRAN) injection 4 mg  4 mg Intravenous Q6H PRN Nita Sells, MD       Oral care mouth rinse  15 mL Mouth Rinse PRN Nita Sells, MD       pantoprazole (PROTONIX) EC tablet 40 mg  40 mg Oral BID Wendee Beavers, RPH       rifaximin Doreene Nest) tablet 550 mg  550 mg Oral BID Debbe Odea, MD   550 mg at 04/28/22 1914     Discharge Medications: Please see discharge summary for a list of discharge medications.  Relevant Imaging Results:  Relevant Lab Results:   Additional Information SSn: [phone]. PFIZER Comrnaty(Gray TOP) Covid-19 Vaccine 04/02/2021  Pfizer COVID-19 Vaccine 08/31/2020 ,  12/25/2019 , 12/03/2019  Pfizer Covid-19 Vaccine Bivalent Booster 04/09/2022  Benard Halsted, LCSW

## 2022-04-28 NOTE — Progress Notes (Signed)
Princess Bruins Demmer 1:31 PM  Subjective: Patient seen and examined and case discussed with his wife and daughter and he is doing better today and is eating his diet and had his NG tube removed and is more alert and has had a few bowel movements and has no other complaints  Objective: Vital signs stable afebrile no acute distress abdomen with some ascites but soft nontender CBC okay no chemistry done today  Assessment: Improved  Plan: Continue present management we will check on tomorrow hopefully can go home soon await [facility] transplant consult  [facility]  office [phone] After 5PM or if no answer call [phone]

## 2022-04-28 NOTE — Progress Notes (Signed)
Physical Therapy Treatment Patient Details Name: Peter Nichols MRN: [id number] DOB: [birth year] Today's Date: 04/28/2022   History of Present Illness Peter Nichols is a 75 y.o. male with a past medical history significant for HTN, chronic thrombocytopenia, GERD, decompensated alcoholic cirrhosis, portal hypertension, hepatic encephalopathy, hepatic hydrothorax, recurrent ascites, large left retroperitoneal varix who presented to [facility] ED on 7/1 with complaints of weakness, confusion, persistent hypotension with diuretic use and required initiation of levophed. Pt now s/p TIPS procedure.    PT Comments    Pt seen for re-evaluation following his TIPS procedure, after which he required NGT placement as well. This has since been removed and pt able to eat for the first time this morning. Prior to the procedure, pt was moving very well (ambulating 200' with a RW with supervision from PT). However, today he was very limited with mobility and demonstrated significant weakness. He required min A for bed mobility, mod A to complete sit<>stand transfers and was only able to take sides steps at EOB with RW and min A for stability. Pt's spouse reporting that prior to admission pt was very independent and greatly enjoys fishing. He is eager to return to his PLOF. Based on pt's functional decline following his procedure, PT updating recommendations to AIR for further intensive therapy services to maximize his safety and independence with functional mobility prior to returning home with family support.   Recommendations for follow up therapy are one component of a multi-disciplinary discharge planning process, led by the attending physician.  Recommendations may be updated based on patient status, additional functional criteria and insurance authorization.  Follow Up Recommendations  Acute inpatient rehab (3hours/day)     Assistance Recommended at Discharge Frequent or constant Supervision/Assistance  Patient  can return home with the following A lot of help with walking and/or transfers;A lot of help with bathing/dressing/bathroom;Help with stairs or ramp for entrance;Assist for transportation;Assistance with cooking/housework   Equipment Recommendations  Other (comment) (defer to next venue of care)    Recommendations for Other Services Rehab consult     Precautions / Restrictions Precautions Precautions: Fall Restrictions Weight Bearing Restrictions: No     Mobility  Bed Mobility Overal bed mobility: Needs Assistance Bed Mobility: Rolling, Sidelying to Sit, Sit to Sidelying Rolling: Supervision Sidelying to sit: Min guard     Sit to sidelying: Min assist General bed mobility comments: increased time and effort, cueing for log roll technique for comfort, use of bed rails, min A to return bilateral LEs onto bed    Transfers Overall transfer level: Needs assistance Equipment used: Rolling walker (2 wheels) Transfers: Sit to/from Stand Sit to Stand: From elevated surface, Mod assist           General transfer comment: increased time and effort, cueing for safe hand placement and technique (however, he still pulled up with both UEs on RW), mod A to power up into a full upright standing position    Ambulation/Gait Ambulation/Gait assistance: Min assist   Assistive device: Rolling walker (2 wheels)         General Gait Details: pt able to take several side steps (6-7) at EOB towards his left side with use of RW and min A for stability   Stairs             Wheelchair Mobility    Modified Rankin (Stroke Patients Only)       Balance Overall balance assessment: Needs assistance Sitting-balance support: Feet supported Sitting balance-Leahy Scale: Fair  Standing balance support: During functional activity, Bilateral upper extremity supported Standing balance-Leahy Scale: Poor                              Cognition Arousal/Alertness:  Lethargic Behavior During Therapy: WFL for tasks assessed/performed Overall Cognitive Status: Within Functional Limits for tasks assessed                                          Exercises      General Comments        Pertinent Vitals/Pain Pain Assessment Pain Assessment: No/denies pain    Home Living                          Prior Function            PT Goals (current goals can now be found in the care plan section) Acute Rehab PT Goals PT Goal Formulation: With patient/family Time For Goal Achievement: 05/06/22 Potential to Achieve Goals: Good Progress towards PT goals: Not progressing toward goals - comment (regression since s/p TIPS procedure)    Frequency    Min 3X/week      PT Plan Discharge plan needs to be updated    Co-evaluation              AM-PAC PT "6 Clicks" Mobility   Outcome Measure  Help needed turning from your back to your side while in a flat bed without using bedrails?: None Help needed moving from lying on your back to sitting on the side of a flat bed without using bedrails?: A Little Help needed moving to and from a bed to a chair (including a wheelchair)?: A Little Help needed standing up from a chair using your arms (e.g., wheelchair or bedside chair)?: A Lot Help needed to walk in hospital room?: A Lot Help needed climbing 3-5 steps with a railing? : Total 6 Click Score: 15    End of Session Equipment Utilized During Treatment: Gait belt Activity Tolerance: Patient limited by fatigue Patient left: in bed;with call bell/phone within reach;with bed alarm set;with family/visitor present Nurse Communication: Mobility status PT Visit Diagnosis: Unsteadiness on feet (R26.81);Muscle weakness (generalized) (M62.81)     Time: 1013-1040 PT Time Calculation (min) (ACUTE ONLY): 27 min  Charges:  $Therapeutic Activity: 8-22 mins                     Anastasio Champion, DPT  Acute Rehabilitation  Services Office [place] 04/28/2022, 12:01 PM

## 2022-04-28 NOTE — TOC Initial Note (Signed)
Transition of Care [facility]) - Initial/Assessment Note    Patient Details  Name: Peter Nichols MRN: [id number] Date of Birth: 01-Jan-1946  Transition of Care [facility]) CM/SW Contact:    Barton Fanny, [place] Work Phone Number: 04/28/2022, 4:14 PM  Clinical Narrative:                 MSW intern entered room to speak with patient, but patient was asleep. Patient's wife was present at bedside and asked to speak with MSW intern in the hallway to not disrupt patient.   MSW intern spoke with patient's wife in the hallway regarding the discharge plan. Patient's wife advised patient wishes to return home with the understanding he needs to gain strength before discharge. MSW intern provided SNF list. Patient's wife stated she wishes to give patient a little more time to see if he is able to gain strength before making decision.   Expected Discharge Plan: Skilled Nursing Facility Barriers to Discharge: Continued Medical Work up   Patient Goals and CMS Choice Patient states their goals for this hospitalization and ongoing recovery are:: [facility]17/2022 CMS Medicare.gov Compare Post Acute Care list provided to:: Patient Choice offered to / list presented to : Patient  Expected Discharge Plan and Services Expected Discharge Plan: [place]   Discharge Planning Services: CM Consult   Living arrangements for the past 2 months: Single Family Home                                      Prior Living Arrangements/Services Living arrangements for the past 2 months: Single Family Home Lives with:: Spouse Patient language and need for interpreter reviewed:: Yes Do you feel safe going back to the place where you live?: Yes      Need for Family Participation in Patient Care: Yes (Comment) Care giver support system in place?: Yes (comment)   Criminal Activity/Legal Involvement Pertinent to Current Situation/Hospitalization: No - Comment as needed  Activities  of Daily Living      Permission Sought/Granted                  Emotional Assessment Appearance:: Appears stated age Attitude/Demeanor/Rapport: Unable to Assess   Orientation: : Oriented to Place, Oriented to Self Alcohol / Substance Use: Not Applicable Psych Involvement: No (comment)  Admission diagnosis:  Hepatic encephalopathy ([place]) [K76.82] Dehydration [E86.0] Lactic acidosis [E87.20] Weakness [R53.1] Thrombocytopenia (HCC) [D69.6] Superficial vein thrombosis [E42.353] Alcoholic cirrhosis of liver with ascites ([place]) [K70.31] Decompensated hepatic cirrhosis ([place]) [K72.90, K74.60] Pressure injury of sacral region, stage 2 ([place]) [L89.152] Patient Active Problem List   Diagnosis Date Noted   Lactic acidosis 05/02/2022   Generalized weakness 04/26/2022   Decompensated hepatic cirrhosis ([place])    GERD (gastroesophageal reflux disease)    Ascites of liver 01/01/2022   Anemia, unspecified 01/01/2022   Hyperglycemia 01/01/2022   Hepatic encephalopathy ([place]) 12/31/2021   Pancreatitis, acute 06/01/2021   AKI (acute kidney injury) ([place]) 05/27/2021   Near syncope 05/27/2021   Steatohepatitis, non-alcoholic    Hyperlipidemia    Overweight (BMI 25.0-29.9)    Alcoholic cirrhosis of liver with ascites ([place])    Acute hepatic encephalopathy ([place]) 05/22/2021   Thrombocytopenia ([place]) 05/04/2017   PCP:  [name], MD Pharmacy:   [id number] [place] [id number]: [phone]: [phone]  [facility] [address] [place] [address] Phone: [phone] Fax: [phone]     Social Determinants of Health (SDOH) Interventions    Readmission Risk Interventions     No data to display

## 2022-04-29 ENCOUNTER — Inpatient Hospital Stay (HOSPITAL_COMMUNITY): Payer: Medicare HMO

## 2022-04-29 ENCOUNTER — Other Ambulatory Visit (HOSPITAL_COMMUNITY): Payer: Medicare HMO

## 2022-04-29 DIAGNOSIS — K7031 Alcoholic cirrhosis of liver with ascites: Secondary | ICD-10-CM | POA: Diagnosis not present

## 2022-04-29 DIAGNOSIS — K7290 Hepatic failure, unspecified without coma: Secondary | ICD-10-CM | POA: Diagnosis not present

## 2022-04-29 DIAGNOSIS — R531 Weakness: Secondary | ICD-10-CM

## 2022-04-29 DIAGNOSIS — K7682 Hepatic encephalopathy: Secondary | ICD-10-CM

## 2022-04-29 DIAGNOSIS — R5381 Other malaise: Secondary | ICD-10-CM

## 2022-04-29 DIAGNOSIS — D696 Thrombocytopenia, unspecified: Secondary | ICD-10-CM | POA: Diagnosis not present

## 2022-04-29 DIAGNOSIS — D649 Anemia, unspecified: Secondary | ICD-10-CM

## 2022-04-29 LAB — CBC WITH DIFFERENTIAL/PLATELET
Abs Immature Granulocytes: 0.07 10*3/uL (ref 0.00–0.07)
Basophils Absolute: 0.1 10*3/uL (ref 0.0–0.1)
Basophils Relative: 1 %
Eosinophils Absolute: 0.6 10*3/uL — ABNORMAL HIGH (ref 0.0–0.5)
Eosinophils Relative: 8 %
HCT: 30 % — ABNORMAL LOW (ref 39.0–52.0)
Hemoglobin: 10.5 g/dL — ABNORMAL LOW (ref 13.0–17.0)
Immature Granulocytes: 1 %
Lymphocytes Relative: 12 %
Lymphs Abs: 0.8 10*3/uL (ref 0.7–4.0)
MCH: 34.9 pg — ABNORMAL HIGH (ref 26.0–34.0)
MCHC: 35 g/dL (ref 30.0–36.0)
MCV: 99.7 fL (ref 80.0–100.0)
Monocytes Absolute: 0.8 10*3/uL (ref 0.1–1.0)
Monocytes Relative: 11 %
Neutro Abs: 4.9 10*3/uL (ref 1.7–7.7)
Neutrophils Relative %: 67 %
Platelets: 79 10*3/uL — ABNORMAL LOW (ref 150–400)
RBC: 3.01 MIL/uL — ABNORMAL LOW (ref 4.22–5.81)
RDW: 15.7 % — ABNORMAL HIGH (ref 11.5–15.5)
WBC: 7.2 10*3/uL (ref 4.0–10.5)
nRBC: 0 % (ref 0.0–0.2)

## 2022-04-29 MED ORDER — LACTULOSE 10 GM/15ML PO SOLN
30.0000 g | Freq: Four times a day (QID) | ORAL | Status: DC
  Administered 2022-04-29 – 2022-04-30 (×6): 30 g via ORAL
  Filled 2022-04-29 (×6): qty 45

## 2022-04-29 NOTE — Progress Notes (Signed)
PROGRESS NOTE  Peter Nichols WCB:[id number] DOB: [birth year]   PCP: [name], MD  Patient is from: Home.  DOA: 04/22/2022 LOS: 3  Chief complaints Chief Complaint  Patient presents with   Weakness     Brief Narrative / Interim history: 76 year old M with PMH of neuromuscular disorder, alcoholic cirrhosis with recurrent ascites, hepatic encephalopathy, hepatic hydrothorax, anemia and thrombocytopenia presented with weakness and admitted for decompensated alcoholic hepatic cirrhosis with cystitis, hepatic hydrothorax, acute hepatic encephalopathy and physical deconditioning.  He has lactic acidosis to 4.3.  PCCM consulted on 7/2.  He underwent paracentesis with removal of 3 L on 7/2. Right thoracocentesis with removal of 1 L on 7/6.  [facility] consulted on 7/3.  He had TIPS placed on 7/7 by IR.   [facility] following.  Therapy recommended SNF once cleared by GI.  Subjective: Seen and examined earlier this morning.  No major events overnight of this morning.  Has not had a bowel movement in 2 days.  Also lies to get out of the bed and sit on the chair.  No other complaints.  Denies chest pain, dyspnea, nausea, vomiting, abdominal pain or UTI symptoms.  Patient's wife at bedside.  Objective: Vitals:   04/29/22 0401 04/29/22 0740 04/29/22 1126 04/29/22 1525  BP: (!) 113/54 (!) 107/48 127/60 (!) 125/52  Pulse: 81 80 93 88  Resp: 19 20 20 20   Temp: 97.8 F (36.6 C) 97.7 F (36.5 C) 97.9 F (36.6 C) 98.3 F (36.8 C)  TempSrc: Oral Oral Oral Oral  SpO2: 99% 97% 95% 96%  Weight:      Height:        Examination:  GENERAL: Appears frail.  No apparent distress. HEENT: MMM.  Vision and hearing grossly intact.  NECK: Supple.  No apparent JVD.  RESP:  No IWOB.  Fair aeration bilaterally. CVS:  RRR. Heart sounds normal.  ABD/GI/GU: BS+. Abd full and distended but nontender. MSK/EXT:  Moves extremities.  Significant muscle mass and subcu fat loss. SKIN: Skin jaundice NEURO: Awake,  alert and oriented to self, place, person and months.  No apparent focal neuro deficit. PSYCH: Calm. Normal affect.   Procedures:  7/2-paracentesis with removal of 3 L 7/6-right thoracocentesis with removal of 1 L 7/7-TIPS placement by IR  Microbiology summarized: 7/2-blood cultures NGTD. 7/2-peritoneal fluid culture NGTD.  Assessment and plan: Principal Problem:   Decompensated hepatic cirrhosis (HCC) Active Problems:   Thrombocytopenia (HCC)   Steatohepatitis, non-alcoholic   Alcoholic cirrhosis of liver with ascites (HCC)   Anemia, unspecified   Lactic acidosis   GERD (gastroesophageal reflux disease)   Generalized weakness  Decompensated alcoholic hepatic cirrhosis with recurrent ascites and hepatic hydrothorax Elevated liver enzymes/hyperbilirubinemia Coagulopathy -S/p paracentesis with removal of 3 L on 7/2. -S/p TIPS by IR on 7/7. -Previously turned down by [facility] for liver transplant due to age. -Inderal, Lasix and Aldactone held due to soft blood pressure.  Acute hepatic encephalopathy and ileus after TIPS-reportedly no BMs in 2 to 3 days now despite lactulose.  Mentation is fair.  He is awake and alert, and oriented to self, person, place, month and situation. -Continue lactulose 30 g 3 times daily -Reorientation and delirium precautions -Avoid sedating medications  Normocytic anemia/thrombocytopenia: Likely due to liver cirrhosis.  Stable. -Continue monitoring  Physical deconditioning: -OOB/PT/OT   Lactic acidosis: Likely due to perfusion.    Hyperkalemia/hyponatremia: Resolved.   Body mass index is 23.86 kg/m.         DVT prophylaxis:  SCDs Start: 05/15/2022 2044  Code Status: Full code Family Communication: Updated patient's wife at bedside. Level of care: Telemetry Medical Status is: Inpatient Remains inpatient appropriate because: Decompensated liver cirrhosis, hepatic encephalopathy and physical deconditioning   Final disposition:  SNF Consultants:  Gastroenterology Pulmonology Interventional radiology  Sch Meds:  Scheduled Meds:  Chlorhexidine Gluconate Cloth  6 each Topical Daily   lactulose  30 g Oral TID   pantoprazole  40 mg Oral BID   rifaximin  550 mg Oral BID   Continuous Infusions:  sodium chloride Stopped (04/20/2022 1507)   PRN Meds:.acetaminophen **OR** acetaminophen, albuterol, [DISCONTINUED] ondansetron **OR** ondansetron (ZOFRAN) IV, mouth rinse  Antimicrobials: Anti-infectives (From admission, onward)    Start     Dose/Rate Route Frequency Ordered Stop   04/28/22 1000  rifaximin (XIFAXAN) tablet 550 mg        550 mg Oral 2 times daily 04/28/22 0741     04/19/22 1000  rifaximin (XIFAXAN) tablet 550 mg  Status:  Discontinued        550 mg Oral 2 times daily 05/02/2022 2043 04/26/22 1750   04/30/2022 1715  cefTRIAXone (ROCEPHIN) 2 g in sodium chloride 0.9 % 100 mL IVPB  Status:  Discontinued        2 g 200 mL/hr over 30 Minutes Intravenous Every 24 hours 04/29/2022 1712 04/25/22 1514        I have personally reviewed the following labs and images: CBC: Recent Labs  Lab 04/25/22 0119 04/26/22 0248 04/27/22 0119 04/28/22 0255 04/29/22 0652  WBC 6.0 6.8 6.2 6.4 7.2  NEUTROABS 4.4 4.6 4.4 4.4 4.9  HGB 9.3* 9.7* 10.0* 10.2* 10.5*  HCT 27.8* 28.2* 28.4* 29.1* 30.0*  MCV 103.7* 102.2* 101.8* 100.3* 99.7  PLT 72* 66* 70* 74* 79*   BMP &GFR Recent Labs  Lab 04/23/22 0248 04/23/2022 0325 05/03/2022 0758 04/26/2022 1346 04/25/22 0119 04/26/22 0339 04/27/22 0119  NA 134* 133* 129* 133* 134* 136 135  K 5.1 5.5* 5.2* 5.1 4.4 4.8 4.5  CL 104 104 101  --  108 107 106  CO2 21* 20* 19*  --  19* 21* 20*  GLUCOSE 111* 127* 113*  --  164* 114* 125*  BUN 16 21 20   --  17 16 15   CREATININE 0.98 1.04 1.02  --  1.12 1.13 0.99  CALCIUM 9.1 9.4 9.0  --  9.0 9.1 9.0  MG 2.1  --   --   --   --   --   --    Estimated Creatinine Clearance: 64.5 mL/min (by C-G formula based on SCr of 0.99 mg/dL). Liver &  Pancreas: Recent Labs  Lab 04/23/22 0248 04/25/2022 0325 04/25/22 0119 04/26/22 0339 04/27/22 0119  AST 33 33 60* 68* 55*  ALT 32 34 39 47* 45*  ALKPHOS 53 58 64 98 104  BILITOT 3.7* 3.8* 4.7* 5.7* 7.7*  PROT 4.8* 5.1* 4.4* 4.5* 4.3*  ALBUMIN 3.1* 3.2* 3.0* 2.9* 2.8*   No results for input(s): "LIPASE", "AMYLASE" in the last 168 hours. Recent Labs  Lab 04/26/22 1428 04/27/22 0119  AMMONIA 194* 95*   Diabetic: No results for input(s): "HGBA1C" in the last 72 hours. Recent Labs  Lab 04/23/22 1923 04/23/22 2353 05/15/2022 0353 05/07/2022 0729 04/19/2022 1526  GLUCAP 136* 143* 120* 108* 109*   Cardiac Enzymes: No results for input(s): "CKTOTAL", "CKMB", "CKMBINDEX", "TROPONINI" in the last 168 hours. No results for input(s): "PROBNP" in the last 8760 hours. Coagulation Profile: Recent Labs  Lab  04/25/2022 0325 04/25/22 0119  INR 2.1* 2.2*   Thyroid Function Tests: No results for input(s): "TSH", "T4TOTAL", "FREET4", "T3FREE", "THYROIDAB" in the last 72 hours. Lipid Profile: No results for input(s): "CHOL", "HDL", "LDLCALC", "TRIG", "CHOLHDL", "LDLDIRECT" in the last 72 hours. Anemia Panel: No results for input(s): "VITAMINB12", "FOLATE", "FERRITIN", "TIBC", "IRON", "RETICCTPCT" in the last 72 hours. Urine analysis:    Component Value Date/Time   COLORURINE AMBER (A) 04/19/2022 0405   APPEARANCEUR CLEAR 04/19/2022 0405   LABSPEC 1.019 04/19/2022 0405   PHURINE 5.0 04/19/2022 0405   GLUCOSEU NEGATIVE 04/19/2022 0405   HGBUR NEGATIVE 04/19/2022 0405   BILIRUBINUR NEGATIVE 04/19/2022 0405   KETONESUR NEGATIVE 04/19/2022 0405   PROTEINUR NEGATIVE 04/19/2022 0405   UROBILINOGEN 1.0 11/27/2007 1029   NITRITE NEGATIVE 04/19/2022 0405   LEUKOCYTESUR NEGATIVE 04/19/2022 0405   Sepsis Labs: Invalid input(s): "PROCALCITONIN", "LACTICIDVEN"  Microbiology: No results found for this or any previous visit (from the past 240 hour(s)).  Radiology Studies: No results  found.    Lavella Myren T. [place]  If 7PM-7AM, please contact night-coverage [email or web address] 04/29/2022, 3:27 PM

## 2022-04-29 NOTE — Progress Notes (Signed)
[facility] Gastroenterology Progress Note  Peter Nichols 76 y.o. 1946/06/25  CC: Alcohol mediated cirrhosis s/p TIPS   Subjective: Patient states he is doing well today.  Feels less confused.  Denies nausea/vomiting.  Denies abdominal pain.  Tolerating diet without difficulty.  ROS : Review of Systems  Constitutional:  Negative for chills and fever.  Gastrointestinal:  Negative for abdominal pain, blood in stool, constipation, diarrhea, heartburn, melena, nausea and vomiting.      Objective: Vital signs in last 24 hours: Vitals:   04/29/22 0401 04/29/22 0740  BP: (!) 113/54 (!) 107/48  Pulse: 81 80  Resp: 19 20  Temp: 97.8 F (36.6 C) 97.7 F (36.5 C)  SpO2: 99% 97%    Physical Exam:  General:  Thin, temporal wasting, multiple telangiectasias  Head:  Normocephalic, without obvious abnormality, atraumatic  Eyes:  Anicteric sclera, EOM's intact  Lungs:   Clear to auscultation bilaterally, respirations unlabored  Heart:  Regular rate and rhythm, S1, S2 normal  Abdomen:   Ascites, soft, non-tender, bowel sounds active all four quadrants,  no masses,     Lab Results: Recent Labs    04/27/22 0119  NA 135  K 4.5  CL 106  CO2 20*  GLUCOSE 125*  BUN 15  CREATININE 0.99  CALCIUM 9.0   Recent Labs    04/27/22 0119  AST 55*  ALT 45*  ALKPHOS 104  BILITOT 7.7*  PROT 4.3*  ALBUMIN 2.8*   Recent Labs    04/28/22 0255 04/29/22 0652  WBC 6.4 7.2  NEUTROABS 4.4 4.9  HGB 10.2* 10.5*  HCT 29.1* 30.0*  MCV 100.3* 99.7  PLT 74* 79*   No results for input(s): "LABPROT", "INR" in the last 72 hours.    Assessment Alcoholic cirrhosis s/p TIPS 7/8 - HGB 10.5 Platelets 79 AST 55 ALT 45  Alkphos 104 TBili 7.7 GFR >60  INR 04/25/2022 2.2  MELD 3.0: 24 at 04/27/2022  1:19 AM MELD-Na: 24 at 04/27/2022  1:19 AM Calculated from: Serum Creatinine: 0.99 mg/dL (Using min of 1 mg/dL) at 04/27/2022  1:19 AM Serum Sodium: 135 mmol/L at 04/27/2022  1:19 AM Total Bilirubin: 7.7  mg/dL at 04/27/2022  1:19 AM Serum Albumin: 2.8 g/dL at 04/27/2022  1:19 AM INR(ratio): 2.2 at 04/25/2022  1:19 AM Age at listing (hypothetical): 40 years Sex: Male at 04/27/2022  1:19 AM     Plan: Continue present management, mental status appearing to improve Continue supportive care [facility] will follow  Garnette Scheuermann PA-C 04/29/2022, 10:26 AM  Contact #  [phone]

## 2022-04-29 NOTE — TOC Progression Note (Addendum)
Transition of Care [facility]) - Progression Note    Patient Details  Name: Peter Nichols MRN: [id number] Date of Birth: [birth year]  Transition of Care [facility]) CM/SW [place], LCSW Phone Number: 04/29/2022, 12:15 PM  Clinical Narrative:    12pm-CSW met with patient's spouse and provided SNF bed offers. She requested CSW check back around 3pm as their children are coming up and they are going to discuss SNF with patient.   3:30pm-Spouse selected [facility] as their first choice. CSW submitted clinicals to [facility] and received approval: Ref# [id number], effective 04/30/2022-05/04/2022.   Expected Discharge Plan: [place] Barriers to Discharge: Continued Medical Work up  Expected Discharge Plan and Services Expected Discharge Plan: [place]   Discharge Planning Services: CM Consult   Living arrangements for the past 2 months: Single Family Home                                       Social Determinants of Health (SDOH) Interventions    Readmission Risk Interventions     No data to display

## 2022-04-29 NOTE — Progress Notes (Signed)
Referring Physician(s): Dr. [name]   Supervising Physician: Jacqulynn Cadet  Patient Status:  [facility] - In-pt  Chief Complaint:   Subjective: Patient sitting up in bed, his wife is feeding him lunch. He has his eyes closed but was able to answer questions appropriately. He denies any pain. He is more confused today according to family and he's having less bowel movements.   Allergies: Ensure  Medications: Prior to Admission medications   Medication Sig Start Date End Date Taking? Authorizing Provider  b complex vitamins capsule Take 1 capsule by mouth daily.   Yes [provider]  calcium carbonate (TUMS - DOSED IN MG ELEMENTAL CALCIUM) 500 MG chewable tablet Chew 1 tablet by mouth daily as needed for indigestion or heartburn.   Yes [provider]  fluticasone (FLONASE) 50 MCG/ACT nasal spray Place 1-2 sprays into both nostrils daily as needed for allergies or rhinitis.   Yes [provider]  furosemide (LASIX) 40 MG tablet Take 40 mg by mouth daily. 01/09/22  Yes [provider]  lactulose (CHRONULAC) 10 GM/15ML solution Take 45 mLs (30 g total) by mouth 3 (three) times daily. Patient taking differently: Take 30 g by mouth 2 (two) times daily. 01/02/22  Yes Allie Bossier, MD  Multiple Vitamins-Minerals (MULTIVITAMIN MEN 50+) TABS Take 1 tablet by mouth daily with breakfast.   Yes [provider]  pantoprazole (PROTONIX) 40 MG tablet Take 40 mg by mouth daily before breakfast.   Yes [provider]  pravastatin (PRAVACHOL) 40 MG tablet Take 40 mg by mouth daily.   Yes [provider]  Protein POWD Take 1 Scoop by mouth See admin instructions. Unnamed WHEY-FREE protein powder: Mix 1 scoopful of powder into the appropriate amount of liquid and drink once a day as needed for nutritional support   Yes [provider]  rifaximin (XIFAXAN) 550 MG TABS tablet Take 1 tablet (550 mg total) by mouth 2 (two) times daily.  01/02/22  Yes Allie Bossier, MD  spironolactone (ALDACTONE) 50 MG tablet Take 150 mg by mouth daily. 01/09/22  Yes [provider]  feeding supplement (ENSURE ENLIVE / ENSURE PLUS) LIQD Take 237 mLs by mouth daily. Patient not taking: Reported on 05/18/2022 01/02/22   Allie Bossier, MD  Multiple Vitamin (MULTIVITAMIN WITH MINERALS) TABS tablet Take 1 tablet by mouth daily. Patient not taking: Reported on 04/23/2022 05/25/21   Annita Brod, MD  Nutritional Supplements (,FEEDING SUPPLEMENT, PROSOURCE PLUS) liquid Take 30 mLs by mouth 2 (two) times daily between meals. Patient not taking: Reported on 05/10/2022 01/02/22   Allie Bossier, MD  pravastatin (PRAVACHOL) 20 MG tablet Take 3 tablets (60 mg total) by mouth daily. Patient not taking: Reported on 04/21/2022 01/03/22   Allie Bossier, MD  propranolol (INDERAL) 10 MG tablet Take 1 tablet (10 mg total) by mouth 2 (two) times daily. Patient not taking: Reported on 04/22/2022 06/03/21   Elgergawy, Silver Huguenin, MD     Vital Signs: BP 127/60 (BP Location: Right Arm)   Pulse 93   Temp 97.9 F (36.6 C) (Oral)   Resp 20   Ht 5' 9"  (1.753 m)   Wt 161 lb 9.6 oz (73.3 kg)   SpO2 95%   BMI 23.86 kg/m   Physical Exam Constitutional:      General: He is not in acute distress.    Appearance: He is ill-appearing.  Pulmonary:     Effort: Pulmonary effort is normal.  Abdominal:  Palpations: Abdomen is soft.     Tenderness: There is no abdominal tenderness.  Skin:    General: Skin is warm and dry.  Neurological:     Mental Status: He is alert and oriented to person, place, and time.     Imaging: DG Abd Portable 1V  Result Date: 04/27/2022 CLINICAL DATA:  NG tube placement. EXAM: PORTABLE ABDOMEN - 1 VIEW COMPARISON:  04/27/2022. FINDINGS: There is redemonstration of multiple loops of gas-filled distended small bowel in the abdomen measuring up to 3.5 cm. An enteric tube terminates in the stomach. Cholecystectomy clips, vascular coils,  and stent appear stable. IMPRESSION: 1. Stable dilated loops of small bowel in the abdomen, consistent with history of ileus. 2. The NG tube terminates in the stomach. Electronically Signed   By: Brett Fairy M.D.   On: 04/27/2022 02:23   DG Abd 1 View  Addendum Date: 04/27/2022   ADDENDUM REPORT: 04/27/2022 01:15 ADDENDUM: Critical findings were reported to patient's nurse Esther at 1:15 a.m. Electronically Signed   By: Brett Fairy M.D.   On: 04/27/2022 01:15   Result Date: 04/27/2022 CLINICAL DATA:  Ileus, NG tube placement. EXAM: ABDOMEN - 1 VIEW COMPARISON:  None Available. FINDINGS: Multiple dilated air-fluid loops of small bowel are noted in the abdomen measuring up to 3.6 cm. The reported nasogastric tube is seen at the right lung base and is likely in the right mainstem bronchus. Stable metallic coils are noted in the left upper quadrant, surgical clips and stent in the right upper quadrant. IMPRESSION: 1. Nasogastric tube projects over the right lung base and is likely in the right mainstem bronchus and should be repositioned. 2. Stable gas-filled dilated loops of small bowel, compatible with history of ileus. Electronically Signed: By: Brett Fairy M.D. On: 04/27/2022 01:01   DG Abd Portable 1V  Result Date: 04/26/2022 CLINICAL DATA:  Ileus. EXAM: PORTABLE ABDOMEN - 1 VIEW COMPARISON:  Abdominal CTA 04/20/2022 FINDINGS: TIPS in the right upper quadrant. Vascular coils in the left upper quadrant. Cholecystectomy clips. Mild diffuse gaseous prominence of small bowel, up 3.8 cm in dimension. Small volume of formed stool in the colon. No evidence of free air. IMPRESSION: Mild diffuse gaseous prominence of small bowel, consistent with provided history of ileus. Electronically Signed   By: Keith Rake M.D.   On: 04/26/2022 18:41    Labs:  CBC: Recent Labs    04/26/22 0248 04/27/22 0119 04/28/22 0255 04/29/22 0652  WBC 6.8 6.2 6.4 7.2  HGB 9.7* 10.0* 10.2* 10.5*  HCT 28.2* 28.4*  29.1* 30.0*  PLT 66* 70* 74* 79*    COAGS: Recent Labs    05/27/21 1634 11/07/21 1241 12/10/21 1245 12/23/21 1208 03/17/22 1147 05/04/2022 1755 04/20/22 0812 04/21/22 0910 05/14/2022 0325 04/25/22 0119  INR 1.5*   < > 1.6* 1.7* 1.7*   < > 2.5* 2.3* 2.1* 2.2*  APTT 31  --  35 34 33  --   --   --   --   --    < > = values in this interval not displayed.    BMP: Recent Labs    04/30/2022 0758 05/14/2022 1346 04/25/22 0119 04/26/22 0339 04/27/22 0119  NA 129* 133* 134* 136 135  K 5.2* 5.1 4.4 4.8 4.5  CL 101  --  108 107 106  CO2 19*  --  19* 21* 20*  GLUCOSE 113*  --  164* 114* 125*  BUN 20  --  17 16 15   CALCIUM  9.0  --  9.0 9.1 9.0  CREATININE 1.02  --  1.12 1.13 0.99  GFRNONAA >60  --  >60 >60 >60    LIVER FUNCTION TESTS: Recent Labs    04/26/2022 0325 04/25/22 0119 04/26/22 0339 04/27/22 0119  BILITOT 3.8* 4.7* 5.7* 7.7*  AST 33 60* 68* 55*  ALT 34 39 47* 45*  ALKPHOS 58 64 98 104  PROT 5.1* 4.4* 4.5* 4.3*  ALBUMIN 3.2* 3.0* 2.9* 2.8*    Assessment and Plan:  Decompensated alcoholic cirrhosis with portal hypertension, hepatic encephalopathy, hepatic hydrothorax, recurrent ascites and 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  He is tolerating a regular diet and abdomen is not tender or distended. Case management is working on SNF/Rehab placement.   IR recommends to repeat ammonia level, increase lactulose PO or Enema.   IR will continue to follow.    Electronically Signed: Soyla Dryer, AGACNP-BC [phone] 04/29/2022, 2:54 PM   I spent a total of 15 Minutes at the the patient's bedside AND on the patient's hospital floor or unit, greater than 50% of which was counseling/coordinating care for s/p TIPS

## 2022-04-29 NOTE — Plan of Care (Signed)
  Problem: Clinical Measurements: Goal: Ability to maintain clinical measurements within normal limits will improve Outcome: Progressing Goal: Will remain free from infection Outcome: Progressing Goal: Diagnostic test results will improve Outcome: Progressing Goal: Cardiovascular complication will be avoided Outcome: Progressing   Problem: Clinical Measurements: Goal: Diagnostic test results will improve Outcome: Progressing   Problem: Clinical Measurements: Goal: Cardiovascular complication will be avoided Outcome: Progressing   Problem: Activity: Goal: Risk for activity intolerance will decrease Outcome: Progressing   Problem: Nutrition: Goal: Adequate nutrition will be maintained Outcome: Progressing   Problem: Safety: Goal: Ability to remain free from injury will improve Outcome: Progressing   Problem: Skin Integrity: Goal: Risk for impaired skin integrity will decrease Outcome: Progressing

## 2022-04-30 DIAGNOSIS — Z66 Do not resuscitate: Secondary | ICD-10-CM | POA: Diagnosis not present

## 2022-04-30 DIAGNOSIS — D696 Thrombocytopenia, unspecified: Secondary | ICD-10-CM | POA: Diagnosis not present

## 2022-04-30 DIAGNOSIS — K7290 Hepatic failure, unspecified without coma: Secondary | ICD-10-CM | POA: Diagnosis not present

## 2022-04-30 DIAGNOSIS — Z515 Encounter for palliative care: Secondary | ICD-10-CM

## 2022-04-30 DIAGNOSIS — R531 Weakness: Secondary | ICD-10-CM | POA: Diagnosis not present

## 2022-04-30 DIAGNOSIS — K7031 Alcoholic cirrhosis of liver with ascites: Secondary | ICD-10-CM | POA: Diagnosis not present

## 2022-04-30 DIAGNOSIS — Z7189 Other specified counseling: Secondary | ICD-10-CM | POA: Diagnosis not present

## 2022-04-30 LAB — COMPREHENSIVE METABOLIC PANEL
ALT: 45 U/L — ABNORMAL HIGH (ref 0–44)
AST: 59 U/L — ABNORMAL HIGH (ref 15–41)
Albumin: 2.7 g/dL — ABNORMAL LOW (ref 3.5–5.0)
Alkaline Phosphatase: 132 U/L — ABNORMAL HIGH (ref 38–126)
Anion gap: 8 (ref 5–15)
BUN: 11 mg/dL (ref 8–23)
CO2: 22 mmol/L (ref 22–32)
Calcium: 9.2 mg/dL (ref 8.9–10.3)
Chloride: 108 mmol/L (ref 98–111)
Creatinine, Ser: 0.91 mg/dL (ref 0.61–1.24)
GFR, Estimated: >60 mL/min (ref >60)
Glucose, Bld: 123 mg/dL — ABNORMAL HIGH (ref 70–99)
Potassium: 4.3 mmol/L (ref 3.5–5.1)
Sodium: 138 mmol/L (ref 135–145)
Total Bilirubin: 7.7 mg/dL — ABNORMAL HIGH (ref 0.3–1.2)
Total Protein: 4.6 g/dL — ABNORMAL LOW (ref 6.5–8.1)

## 2022-04-30 LAB — PROTIME-INR
INR: 2.3 — ABNORMAL HIGH (ref 0.8–1.2)
Prothrombin Time: 24.7 seconds — ABNORMAL HIGH (ref 11.4–15.2)

## 2022-04-30 LAB — AMMONIA: Ammonia: 126 umol/L — ABNORMAL HIGH (ref 9–35)

## 2022-04-30 LAB — LACTIC ACID, PLASMA
Lactic Acid, Venous: 2.8 mmol/L (ref 0.5–1.9)
Lactic Acid, Venous: 2.5 mmol/L (ref 0.5–1.9)

## 2022-04-30 LAB — CBC
HCT: 30.1 % — ABNORMAL LOW (ref 39.0–52.0)
Hemoglobin: 10.3 g/dL — ABNORMAL LOW (ref 13.0–17.0)
MCH: 34.6 pg — ABNORMAL HIGH (ref 26.0–34.0)
MCHC: 34.2 g/dL (ref 30.0–36.0)
MCV: 101 fL — ABNORMAL HIGH (ref 80.0–100.0)
Platelets: 80 10*3/uL — ABNORMAL LOW (ref 150–400)
RBC: 2.98 MIL/uL — ABNORMAL LOW (ref 4.22–5.81)
RDW: 15.9 % — ABNORMAL HIGH (ref 11.5–15.5)
WBC: 8 10*3/uL (ref 4.0–10.5)
nRBC: 0 % (ref 0.0–0.2)

## 2022-04-30 LAB — PHOSPHORUS: Phosphorus: 3.1 mg/dL (ref 2.5–4.6)

## 2022-04-30 LAB — MAGNESIUM: Magnesium: 1.9 mg/dL (ref 1.7–2.4)

## 2022-04-30 MED ORDER — LACTULOSE ENEMA
300.0000 mL | Freq: Once | RECTAL | Status: AC
Start: 2022-04-30 — End: 2022-04-30
  Administered 2022-04-30: 300 mL via RECTAL
  Filled 2022-04-30: qty 300

## 2022-04-30 MED ORDER — GUAIFENESIN 100 MG/5ML PO LIQD
5.0000 mL | ORAL | Status: DC | PRN
  Administered 2022-04-30 – 2022-05-10 (×9): 5 mL via ORAL
  Filled 2022-04-30 (×10): qty 5

## 2022-04-30 NOTE — Progress Notes (Signed)
Date and time results received: 04/30/22 9:02 AM  Test: Lactic Acid Critical Value: 2.5  Name of Provider Notified: Cyndia Skeeters

## 2022-04-30 NOTE — Progress Notes (Signed)
Pt had small BM prior to enema.  Enema completed - post enema pt had a smear.

## 2022-04-30 NOTE — Progress Notes (Addendum)
Referring Physician(s): Dr. [name]   Supervising Physician: Aletta Edouard  Patient Status:  [facility] - In-pt  Chief Complaint:  S/p paracentesis, TIPS creation, and ectopic variceal coil embolization on 7/7 performed by Dr. Serafina Royals.  Subjective:  Patient laying in bed, sleeping. Spouse at bedside.  Spouse states that he received some bad news this morning, he was told that his bowel is not contracting well and that is why he has not been able to have bowel movement. Spouse states that they will be meeting with palliative care team tomorrow.   Spouse states that patient appears to be less confused at the moment, he becomes more confused in last afternoon.   Allergies: Ensure  Medications: Prior to Admission medications   Medication Sig Start Date End Date Taking? Authorizing Provider  b complex vitamins capsule Take 1 capsule by mouth daily.   Yes [provider]  calcium carbonate (TUMS - DOSED IN MG ELEMENTAL CALCIUM) 500 MG chewable tablet Chew 1 tablet by mouth daily as needed for indigestion or heartburn.   Yes [provider]  fluticasone (FLONASE) 50 MCG/ACT nasal spray Place 1-2 sprays into both nostrils daily as needed for allergies or rhinitis.   Yes [provider]  furosemide (LASIX) 40 MG tablet Take 40 mg by mouth daily. 01/09/22  Yes [provider]  lactulose (CHRONULAC) 10 GM/15ML solution Take 45 mLs (30 g total) by mouth 3 (three) times daily. Patient taking differently: Take 30 g by mouth 2 (two) times daily. 01/02/22  Yes Allie Bossier, MD  Multiple Vitamins-Minerals (MULTIVITAMIN MEN 50+) TABS Take 1 tablet by mouth daily with breakfast.   Yes [provider]  pantoprazole (PROTONIX) 40 MG tablet Take 40 mg by mouth daily before breakfast.   Yes [provider]  pravastatin (PRAVACHOL) 40 MG tablet Take 40 mg by mouth daily.   Yes [provider]  Protein POWD Take 1 Scoop by mouth See admin  instructions. Unnamed WHEY-FREE protein powder: Mix 1 scoopful of powder into the appropriate amount of liquid and drink once a day as needed for nutritional support   Yes [provider]  rifaximin (XIFAXAN) 550 MG TABS tablet Take 1 tablet (550 mg total) by mouth 2 (two) times daily. 01/02/22  Yes Allie Bossier, MD  spironolactone (ALDACTONE) 50 MG tablet Take 150 mg by mouth daily. 01/09/22  Yes [provider]  feeding supplement (ENSURE ENLIVE / ENSURE PLUS) LIQD Take 237 mLs by mouth daily. Patient not taking: Reported on 05/15/2022 01/02/22   Allie Bossier, MD  Multiple Vitamin (MULTIVITAMIN WITH MINERALS) TABS tablet Take 1 tablet by mouth daily. Patient not taking: Reported on 05/17/2022 05/25/21   Annita Brod, MD  Nutritional Supplements (,FEEDING SUPPLEMENT, PROSOURCE PLUS) liquid Take 30 mLs by mouth 2 (two) times daily between meals. Patient not taking: Reported on 05/07/2022 01/02/22   Allie Bossier, MD  pravastatin (PRAVACHOL) 20 MG tablet Take 3 tablets (60 mg total) by mouth daily. Patient not taking: Reported on 05/02/2022 01/03/22   Allie Bossier, MD  propranolol (INDERAL) 10 MG tablet Take 1 tablet (10 mg total) by mouth 2 (two) times daily. Patient not taking: Reported on 05/08/2022 06/03/21   Elgergawy, Silver Huguenin, MD     Vital Signs: BP (!) 118/51 (BP Location: Right Arm)   Pulse 82   Temp 98 F (36.7 C) (Axillary)   Resp 18   Ht 5' 9"  (1.753 m)   Wt 161 lb  9.6 oz (73.3 kg)   SpO2 96%   BMI 23.86 kg/m   Physical Exam Vitals reviewed.  Constitutional:      General: He is not in acute distress.    Comments: Sleeping, easily arousal with verbal stimuli   Abdominal:     Palpations: Abdomen is soft.  Skin:    General: Skin is warm and dry.     Coloration: Skin is not jaundiced.  Neurological:     Mental Status: He is oriented to person, place, and time.  Psychiatric:        Behavior: Behavior normal.     Comments: Lethargic       Imaging: DG Abd Portable 1V  Result Date: 04/29/2022 CLINICAL DATA:  Abdominal distention. EXAM: PORTABLE ABDOMEN - 1 VIEW COMPARISON:  KUB 04/27/2022; CT abdomen and pelvis 04/20/2022 FINDINGS: Moderate fluid material within the stomach. Air and stool are seen likely within the descending colon. Mild interval decrease in air-filled distention of the small bowel. Small bowel loops again measure up to approximately 3.5 cm in caliber. A likely TIPS metallic stent again overlies the right upper abdominal quadrant. Cholecystectomy clips. Left lateral vascular coils. Chronic mid left ilium bone loss is again seen. IMPRESSION: 1. Mild interval decrease in air-filled loops of small bowel, likely mildly improved ileus. 2. Interval removal of nasogastric tube. Electronically Signed   By: Yvonne Kendall M.D.   On: 04/29/2022 15:50   DG Abd Portable 1V  Result Date: 04/27/2022 CLINICAL DATA:  NG tube placement. EXAM: PORTABLE ABDOMEN - 1 VIEW COMPARISON:  04/27/2022. FINDINGS: There is redemonstration of multiple loops of gas-filled distended small bowel in the abdomen measuring up to 3.5 cm. An enteric tube terminates in the stomach. Cholecystectomy clips, vascular coils, and stent appear stable. IMPRESSION: 1. Stable dilated loops of small bowel in the abdomen, consistent with history of ileus. 2. The NG tube terminates in the stomach. Electronically Signed   By: Brett Fairy M.D.   On: 04/27/2022 02:23   DG Abd 1 View  Addendum Date: 04/27/2022   ADDENDUM REPORT: 04/27/2022 01:15 ADDENDUM: Critical findings were reported to patient's nurse Esther at 1:15 a.m. Electronically Signed   By: Brett Fairy M.D.   On: 04/27/2022 01:15   Result Date: 04/27/2022 CLINICAL DATA:  Ileus, NG tube placement. EXAM: ABDOMEN - 1 VIEW COMPARISON:  None Available. FINDINGS: Multiple dilated air-fluid loops of small bowel are noted in the abdomen measuring up to 3.6 cm. The reported nasogastric tube is seen at the right  lung base and is likely in the right mainstem bronchus. Stable metallic coils are noted in the left upper quadrant, surgical clips and stent in the right upper quadrant. IMPRESSION: 1. Nasogastric tube projects over the right lung base and is likely in the right mainstem bronchus and should be repositioned. 2. Stable gas-filled dilated loops of small bowel, compatible with history of ileus. Electronically Signed: By: Brett Fairy M.D. On: 04/27/2022 01:01   DG Abd Portable 1V  Result Date: 04/26/2022 CLINICAL DATA:  Ileus. EXAM: PORTABLE ABDOMEN - 1 VIEW COMPARISON:  Abdominal CTA 04/20/2022 FINDINGS: TIPS in the right upper quadrant. Vascular coils in the left upper quadrant. Cholecystectomy clips. Mild diffuse gaseous prominence of small bowel, up 3.8 cm in dimension. Small volume of formed stool in the colon. No evidence of free air. IMPRESSION: Mild diffuse gaseous prominence of small bowel, consistent with provided history of ileus. Electronically Signed   By: Keith Rake M.D.   On: 04/26/2022  18:41    Labs:  CBC: Recent Labs    04/27/22 0119 04/28/22 0255 04/29/22 0652 04/30/22 0439  WBC 6.2 6.4 7.2 8.0  HGB 10.0* 10.2* 10.5* 10.3*  HCT 28.4* 29.1* 30.0* 30.1*  PLT 70* 74* 79* 80*    COAGS: Recent Labs    05/27/21 1634 11/07/21 1241 12/10/21 1245 12/23/21 1208 03/17/22 1147 05/02/2022 1755 04/21/22 0910 04/29/2022 0325 04/25/22 0119 04/30/22 0439  INR 1.5*   < > 1.6* 1.7* 1.7*   < > 2.3* 2.1* 2.2* 2.3*  APTT 31  --  35 34 33  --   --   --   --   --    < > = values in this interval not displayed.    BMP: Recent Labs    04/25/22 0119 04/26/22 0339 04/27/22 0119 04/30/22 0439  NA 134* 136 135 138  K 4.4 4.8 4.5 4.3  CL 108 107 106 108  CO2 19* 21* 20* 22  GLUCOSE 164* 114* 125* 123*  BUN 17 16 15 11   CALCIUM 9.0 9.1 9.0 9.2  CREATININE 1.12 1.13 0.99 0.91  GFRNONAA >60 >60 >60 >60    LIVER FUNCTION TESTS: Recent Labs    04/25/22 0119 04/26/22 0339  04/27/22 0119 04/30/22 0439  BILITOT 4.7* 5.7* 7.7* 7.7*  AST 60* 68* 55* 59*  ALT 39 47* 45* 45*  ALKPHOS 64 98 104 132*  PROT 4.4* 4.5* 4.3* 4.6*  ALBUMIN 3.0* 2.9* 2.8* 2.7*    Assessment and Plan:  76 y.o. 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   Ammonia 126 this morning, higher than 7/10 (95) - continue oral or Lactulose enema  CBC stable, no leukocytosis, pt afebrile  CMP and INR stable Abdominal xray has been showing ileus, xray yesterday showed mild interval decreased in air-filled loops of small bowel, likely improved ileus.   Further treatment plan per TRH/GI Appreciate and agree with the plan.  IR to follow.    Electronically Signed: Tera Mater, PA-C 04/30/2022, 12:59 PM   I spent a total of 15 Minutes at the the patient's bedside AND on the patient's hospital floor or unit, greater than 50% of which was counseling/coordinating care for s/p TIPS and ectopic variceal coil embolization.   This chart was dictated using voice recognition software.  Despite best efforts to proofread,  errors can occur which can change the documentation meaning.

## 2022-04-30 NOTE — Consult Note (Signed)
Consultation Note Date: 04/30/2022   Patient Name: Peter Nichols  DOB: 11/27/1945  MRN: [id number]  Age / Sex: 76 y.o., male  PCP: [name], MD Referring Physician: Mercy Riding, MD  Reason for Consultation: Establishing goals of care  HPI/Patient Profile: 76 y.o. male  with past medical history of neuromuscular disorder, alcoholic cirrhosis with recurrent ascites, hepatic encephalopathy, hepatic hydrothorax, anemia and thrombocytopenia  admitted on 04/28/2022 with decompensated alcoholic hepatic cirrhosis with cystitis, hepatic hydrothorax, acute hepatic encephalopathy and physical deconditioning. He underwent paracentesis with removal of 3 L on 7/2. Right thoracocentesis with removal of 1 L on 7/6.He had TIPS placed on 7/7 by IR.  Hospital course complicated by ileus and ongoing hepatic encephalopathy.  PMT consulted to discuss goals of care.  Clinical Assessment and Goals of Care: I have reviewed medical records including EPIC notes, labs and imaging, received report from RN and Dr. Cyndia Skeeters, assessed the patient and then met with patient's wife Izora Gala and 2 daughters Sherron Flemings and Otila Kluver to discuss diagnosis prognosis, GOC, EOL wishes, disposition and options.  Patient briefly opens his eyes when I speak his name.  It is apparent he is confused.  He would not squeeze my hands when I ask him to.  When I ask him if he is in pain he smiles and tells me "no".  No physical signs or symptoms of pain during my assessment the family does report some agitation earlier in the day.  I introduced Palliative Medicine as specialized medical care for people living with serious illness. It focuses on providing relief from the symptoms and stress of a serious illness. The goal is to improve quality of life for both the patient and the family.  Family shares that current state is a drastic decline from his baseline.  They do share he has had some decline since a hernia  surgery in March of this year; however patient was still able to independently care for himself at home.  He was ambulatory and was still driving.  He went out to eat with his wife often.  He had a good appetite per family report.  Family does state that he had a few episodes of confusion but they would call his GI doctor and meds would be adjusted and confusion would clear.   We discussed patient's current illness and what it means in the larger context of patient's on-going co-morbidities.  Natural disease trajectory and expectations at EOL were discussed.  We discussed concern about patient's worsening confusion in the last several days.  We also discussed concern about his ileus.  Family understands medical team is concerned about Bill's prognosis.  I attempted to elicit values and goals of care important to the patient.  Family all agree that [facility] would want to continue current interventions and allow time for outcomes.  We discussed clinical picture may become clear in coming days and with further decline it may become most appropriate to focus on Bill's comfort.  Family expresses understanding.  The difference between aggressive medical intervention and comfort care was considered in light of the patient's goals of care.   Family shares about their decision to change his CODE STATUS to DNR/DNI this morning.  They share some concern that patient would not continue to receive medical treatment and we discussed that CODE STATUS only applies if patient tolerates and that [facility] can still receive full medical care while maintaining DNR CODE STATUS.  Discussed with family the importance of continued conversation with family and  the medical providers regarding overall plan of care and treatment options, ensuring decisions are within the context of the patients values and GOCs.    We discussed symptom management and at this point family would like to hold off on adding any medications to Bill's regimen as  they worry will worsen his mental status and they feel that [facility] is mostly comfortable.  Questions and concerns were addressed. The family was encouraged to call with questions or concerns.  Primary Decision Maker NEXT OF KIN wife Izora Gala    SUMMARY OF RECOMMENDATIONS   For now family would like to continue current interventions and allow time for outcomes PMT will follow closely -my partner plans to follow-up with family tomorrow around 83 AM No additional medications for symptom management at this time per conversation described above  Code Status/Advance Care Planning: DNR      Primary Diagnoses: Present on Admission:  Lactic acidosis  Alcoholic cirrhosis of liver with ascites ([place])  Steatohepatitis, non-alcoholic  Thrombocytopenia (HCC)  Anemia, unspecified   I have reviewed the medical record, interviewed the patient and family, and examined the patient. The following aspects are pertinent.  Past Medical History:  Diagnosis Date   Cirrhosis ([place])    Dysphagia    no current problems   GERD (gastroesophageal reflux disease)    Hyperlipidemia    Hypertension    Neuromuscular disorder (HCC)    tremor   Steatohepatitis, non-alcoholic    Thrombocytopenia (HCC)    Social History   Socioeconomic History   Marital status: Married    Spouse name: Not on file   Number of children: Not on file   Years of education: Not on file   Highest education level: Not on file  Occupational History   Not on file  Tobacco Use   Smoking status: Never   Smokeless tobacco: Never  Vaping Use   Vaping Use: Never used  Substance and Sexual Activity   Alcohol use: Not Currently    Comment: none in 8 months on preop visit of 12/10/21   Drug use: Never   Sexual activity: Not Currently  Other Topics Concern   Not on file  Social History Narrative   Not on file   Social Determinants of Health   Financial Resource Strain: Not on file  Food Insecurity: Not on file  Transportation  Needs: Not on file  Physical Activity: Not on file  Stress: Not on file  Social Connections: Not on file   Family History  Problem Relation Age of Onset   Cancer Mother    Scheduled Meds:  lactulose  30 g Oral QID   pantoprazole  40 mg Oral BID   rifaximin  550 mg Oral BID   Continuous Infusions:  sodium chloride Stopped (04/21/2022 1507)   PRN Meds:.acetaminophen **OR** acetaminophen, albuterol, [DISCONTINUED] ondansetron **OR** ondansetron (ZOFRAN) IV, mouth rinse Allergies  Allergen Reactions   Ensure Nausea And Vomiting   Review of Systems  Unable to perform ROS: Mental status change    Physical Exam Constitutional:      General: He is not in acute distress.    Appearance: He is ill-appearing.     Comments: Lethargic  Pulmonary:     Effort: Pulmonary effort is normal.  Abdominal:     General: There is distension.  Musculoskeletal:     Right lower leg: No edema.     Left lower leg: No edema.  Skin:    General: Skin is warm and dry.  Neurological:  Mental Status: He is disoriented.     Vital Signs: BP (!) 134/53 (BP Location: Right Arm)   Pulse 95   Temp (!) 97 F (36.1 C) (Axillary)   Resp 18   Ht _0  (1.753 m)   Wt 73.3 kg   SpO2 95%   BMI 23.86 kg/m  Pain Scale: 0-10   Pain Score: 0-No pain   SpO2: SpO2: 95 % O2 Device:SpO2: 95 % O2 Flow Rate: .   IO: Intake/output summary:  Intake/Output Summary (Last 24 hours) at 04/30/2022 1626 Last data filed at 04/30/2022 1511 Gross per 24 hour  Intake 1.83 ml  Output 710 ml  Net -708.17 ml    LBM: Last BM Date : 04/28/22 Baseline Weight: Weight: 71.2 kg Most recent weight: Weight: 73.3 kg     Palliative Assessment/Data: PPS 20%     *Please note that this is a verbal dictation therefore any spelling or grammatical errors are due to the "[employer] [employer]" system interpretation.   Juel Burrow, DNP, AGNP-C Palliative Medicine Team [phone] Pager: [phone]

## 2022-04-30 NOTE — Progress Notes (Addendum)
Physical Therapy Treatment Patient Details Name: Peter Nichols MRN: [id number] DOB: 16-Oct-1946 Today's Date: 04/30/2022   History of Present Illness Peter Nichols is a 76 y.o. male with a past medical history significant for HTN, chronic thrombocytopenia, GERD, decompensated alcoholic cirrhosis, portal hypertension, hepatic encephalopathy, hepatic hydrothorax, recurrent ascites, large left retroperitoneal varix who presented to [facility] ED on 7/1 with complaints of weakness, confusion, persistent hypotension with diuretic use and required initiation of levophed. Pt now s/p TIPS procedure.    PT Comments    Pt with significant lethargy throughout which did not improve with verbal, tactile or kinesthetic stimuli. He was only able to participate in bed mobility this session. Additionally, his condom cath was off upon PT arrival - RN was notified. Pt would continue to benefit from skilled physical therapy services at this time while admitted and after d/c to address the below listed limitations in order to improve overall safety and independence with functional mobility.  PT disposition recommendations and frequency were updated as "pt does not appear to demonstrate medical necessity sufficient to gain approval from Pt.'s medicare advantage plan" per CIR AC. Pt would greatly benefit from further therapy services at a SNF prior to returning home with family support.    Recommendations for follow up therapy are one component of a multi-disciplinary discharge planning process, led by the attending physician.  Recommendations may be updated based on patient status, additional functional criteria and insurance authorization.  Follow Up Recommendations  Skilled nursing-short term rehab (<3 hours/day) Can patient physically be transported by private vehicle: No   Assistance Recommended at Discharge Frequent or constant Supervision/Assistance  Patient can return home with the following Two people to help with  walking and/or transfers;Two people to help with bathing/dressing/bathroom;Assistance with cooking/housework;Assistance with feeding;Assist for transportation;Help with stairs or ramp for entrance   Equipment Recommendations  Other (comment) (defer to next venue of care)    Recommendations for Other Services       Precautions / Restrictions Precautions Precautions: Fall Restrictions Weight Bearing Restrictions: No     Mobility  Bed Mobility Overal bed mobility: Needs Assistance Bed Mobility: Rolling Rolling: Total assist         General bed mobility comments: significantly increased time and effort needed, pt verbally agreeing to attempt to sit up at EOB; however, was very lethargic and did not assist with mobility at all. Pt able to roll with total A and unable to achieve an upright sitting position. He was able to scoot himself up towards the [facility] with bed in trendelenburg position and PT providing maximal multimodal cueing to use his bilateral LEs to push into the bed to scoot    Transfers                   General transfer comment: unable to secondary to significant lethargy    Ambulation/Gait                   Stairs             Wheelchair Mobility    Modified Rankin (Stroke Patients Only)       Balance                                            Cognition Arousal/Alertness: Lethargic Behavior During Therapy: Flat affect Overall Cognitive Status: Difficult to assess Area of Impairment:  Problem solving                             Problem Solving: Slow processing, Decreased initiation, Difficulty sequencing, Requires verbal cues, Requires tactile cues          Exercises      General Comments        Pertinent Vitals/Pain Pain Assessment Pain Assessment: No/denies pain    Home Living                          Prior Function            PT Goals (current goals can now be found in  the care plan section) Acute Rehab PT Goals PT Goal Formulation: With patient/family Time For Goal Achievement: 05/06/22 Potential to Achieve Goals: Fair Progress towards PT goals: Not progressing toward goals - comment    Frequency    Min 2X/week      PT Plan Discharge plan needs to be updated;Frequency needs to be updated    Co-evaluation              AM-PAC PT "6 Clicks" Mobility   Outcome Measure  Help needed turning from your back to your side while in a flat bed without using bedrails?: Total Help needed moving from lying on your back to sitting on the side of a flat bed without using bedrails?: Total Help needed moving to and from a bed to a chair (including a wheelchair)?: Total Help needed standing up from a chair using your arms (e.g., wheelchair or bedside chair)?: Total Help needed to walk in hospital room?: Total Help needed climbing 3-5 steps with a railing? : Total 6 Click Score: 6    End of Session   Activity Tolerance: Patient limited by lethargy Patient left: in bed;with call bell/phone within reach;with bed alarm set Nurse Communication: Mobility status;Other (comment) (condom cath was off upon PT arrival) PT Visit Diagnosis: Other abnormalities of gait and mobility (R26.89)     Time: 0811-0827 PT Time Calculation (min) (ACUTE ONLY): 16 min  Charges:  $Therapeutic Activity: 8-22 mins                     Anastasio Champion, DPT  Acute Rehabilitation Services Office [facility] 04/30/2022, 9:27 AM

## 2022-04-30 NOTE — Progress Notes (Signed)
Peter Nichols 8:40 AM  Subjective: Patient doing physical therapy without any new complaints and oriented this morning Case discussed with his nurse  Objective: Vital signs stable afebrile no acute distress abdomen is soft nontender labs stable  Assessment: Cirrhosis with multiple medical problems  Plan: Please call me if any GI question or problem the rest of this week otherwise if still in the hospital this weekend we will ask rounding partner to check on  [facility] E  office [phone] After 5PM or if no answer call [phone]

## 2022-04-30 NOTE — Progress Notes (Signed)
PROGRESS NOTE  Peter Nichols OTL:[id number] DOB: Feb 20, 1946   PCP: [name], MD  Patient is from: Home.  DOA: 05/18/2022 LOS: 56  Chief complaints Chief Complaint  Patient presents with   Weakness     Brief Narrative / Interim history: 76 year old M with PMH of neuromuscular disorder, alcoholic cirrhosis with recurrent ascites, hepatic encephalopathy, hepatic hydrothorax, anemia and thrombocytopenia presented with weakness and admitted for decompensated alcoholic hepatic cirrhosis with cystitis, hepatic hydrothorax, acute hepatic encephalopathy and physical deconditioning.  He has lactic acidosis to 4.3.  PCCM consulted on 7/2.  He underwent paracentesis with removal of 3 L on 7/2. Right thoracocentesis with removal of 1 L on 7/6.  [facility] consulted on 7/3.  He had TIPS placed on 7/7 by IR.  Hospital course complicated by ileus requiring NG tube.   Patient with gradual decline, worsening hepatic encephalopathy, coagulopathy and failure to thrive picture.  Poor prognosis.  CODE STATUS changed to DNR/DNI after discussion with patient's wife and daughter at bedside.  Palliative medicine consulted for further goals of care discussion.   Subjective: Seen and examined earlier this morning.  No major events overnight of this morning.  Patient is awake but not quite alert.  He is oriented to self, place and his wife but not his daughter or time.  He denies pain.  Per wife, he ate his breakfast well.  Has not had bowel movements in over 2 days despite taking lactulose.  Discussed with patient's wife and daughter about patient's gradual decline and poor prognosis.  We have discussed about CODE STATUS including pros and cons of CPR and intubation.  Family believes DNR and DNI is appropriate.  I have also broached about comfort care briefly and offered palliative medicine consult.  Wife and daughter tearful but very appreciative.   Objective: Vitals:   04/29/22 2333 04/30/22 0300 04/30/22 0747  04/30/22 1453  BP: (!) 121/53 (!) 125/46 (!) 118/51 (!) 134/53  Pulse: 87 85 82 95  Resp: 18 16 18 18   Temp: 98.1 F (36.7 C) 98.1 F (36.7 C) 98 F (36.7 C) (!) 97 F (36.1 C)  TempSrc: Oral Axillary Axillary Axillary  SpO2: 96% 96% 96% 95%  Weight:      Height:        Examination:  GENERAL: Very frail.  Somewhat lethargic. HEENT: MMM.  Vision and hearing grossly intact.  NECK: Supple.  No apparent JVD.  RESP:  No IWOB.  Fair aeration bilaterally. CVS:  RRR. Heart sounds normal.  ABD/GI/GU: BS+. Abd full but soft.  No tenderness. MSK/EXT:  Moves extremities.  Significant muscle mass and subcu fat loss. SKIN: Skin jaundice.  Skin bruises. NEURO: Awake but not alert.  Oriented to self, place and his wife but not his daughter or time.  No apparent focal neuro deficit. PSYCH: Calm. Normal affect.   Procedures:  7/2-paracentesis with removal of 3 L 7/6-right thoracocentesis with removal of 1 L 7/7-TIPS placement by IR  Microbiology summarized: 7/2-blood cultures NGTD. 7/2-peritoneal fluid culture NGTD.  Assessment and plan: Principal Problem:   Decompensated hepatic cirrhosis (HCC) Active Problems:   Thrombocytopenia (HCC)   Steatohepatitis, non-alcoholic   Alcoholic cirrhosis of liver with ascites (HCC)   Anemia, unspecified   Lactic acidosis   GERD (gastroesophageal reflux disease)   Generalized weakness   Goals of care, counseling/discussion   DNR (do not resuscitate)  Decompensated alcoholic hepatic cirrhosis with recurrent ascites and hepatic hydrothorax Elevated liver enzymes/hyperbilirubinemia Coagulopathy: INR 2.3. -S/p paracentesis with removal  of 3 L on 7/2. -S/p TIPS by IR on 7/7. -Previously turned down by [facility] for liver transplant due to age. -Inderal, Lasix and Aldactone held due to soft blood pressure. -Overall, gradual decline with worsening hepatic encephalopathy, coagulopathy, hyperbilirubinemia.  Acute hepatic encephalopathy and ileus after  TIPS-reportedly no BMs in 2 to 3 days now despite lactulose.  Mentation seems to be worse today.  He is awake but not alert.  Oriented to self, wife and place but not his daughter. -Increase lactulose to 30 g 4 times a day -Give lactulose enema x1 -Reorientation and delirium precautions -Avoid sedating medications  Normocytic anemia/thrombocytopenia: Likely due to liver cirrhosis.  Stable. -Continue monitoring  Physical deconditioning: -OOB/PT/OT   Lactic acidosis: Likely due to perfusion and decompensated liver cirrhosis.    Hyperkalemia/hyponatremia: Resolved.  Goal of care counseling: Patient with gradual decline physically and mentally due to decompensated liver cirrhosis with ascites.  Still coagulopathic.  Very poor prognosis.  Had extensive discussion with patient's family including wife and daughter at bedside. CODE STATUS changed to DNR/DNI after discussion with patient's wife and daughter at bedside.  -Palliative medicine consulted for further goals of care discussion  Body mass index is 23.86 kg/m.         DVT prophylaxis:  SCDs Start: 04/21/2022 2044  Code Status: Full code Family Communication: Updated patient's wife and daughter at bedside. Level of care: Telemetry Medical Status is: Inpatient Remains inpatient appropriate because: Decompensated liver cirrhosis, hepatic encephalopathy and physical deconditioning   Final disposition: TBD Consultants:  Gastroenterology Pulmonology Interventional radiology Palliative medicine  Sch Meds:  Scheduled Meds:  lactulose  30 g Oral QID   pantoprazole  40 mg Oral BID   rifaximin  550 mg Oral BID   Continuous Infusions:  sodium chloride Stopped (05/15/2022 1507)   PRN Meds:.acetaminophen **OR** acetaminophen, albuterol, [DISCONTINUED] ondansetron **OR** ondansetron (ZOFRAN) IV, mouth rinse  Antimicrobials: Anti-infectives (From admission, onward)    Start     Dose/Rate Route Frequency Ordered Stop   04/28/22  1000  rifaximin (XIFAXAN) tablet 550 mg        550 mg Oral 2 times daily 04/28/22 0741     04/19/22 1000  rifaximin (XIFAXAN) tablet 550 mg  Status:  Discontinued        550 mg Oral 2 times daily 04/29/2022 2043 04/26/22 1750   04/23/2022 1715  cefTRIAXone (ROCEPHIN) 2 g in sodium chloride 0.9 % 100 mL IVPB  Status:  Discontinued        2 g 200 mL/hr over 30 Minutes Intravenous Every 24 hours 05/03/2022 1712 04/25/22 1514        I have personally reviewed the following labs and images: CBC: Recent Labs  Lab 04/25/22 0119 04/26/22 0248 04/27/22 0119 04/28/22 0255 04/29/22 0652 04/30/22 0439  WBC 6.0 6.8 6.2 6.4 7.2 8.0  NEUTROABS 4.4 4.6 4.4 4.4 4.9  --   HGB 9.3* 9.7* 10.0* 10.2* 10.5* 10.3*  HCT 27.8* 28.2* 28.4* 29.1* 30.0* 30.1*  MCV 103.7* 102.2* 101.8* 100.3* 99.7 101.0*  PLT 72* 66* 70* 74* 79* 80*   BMP &GFR Recent Labs  Lab 04/28/2022 0758 05/13/2022 1346 04/25/22 0119 04/26/22 0339 04/27/22 0119 04/30/22 0439  NA 129* 133* 134* 136 135 138  K 5.2* 5.1 4.4 4.8 4.5 4.3  CL 101  --  108 107 106 108  CO2 19*  --  19* 21* 20* 22  GLUCOSE 113*  --  164* 114* 125* 123*  BUN 20  --  17 16 15 11   CREATININE 1.02  --  1.12 1.13 0.99 0.91  CALCIUM 9.0  --  9.0 9.1 9.0 9.2  MG  --   --   --   --   --  1.9  PHOS  --   --   --   --   --  3.1   Estimated Creatinine Clearance: 70.1 mL/min (by C-G formula based on SCr of 0.91 mg/dL). Liver & Pancreas: Recent Labs  Lab 04/26/2022 0325 04/25/22 0119 04/26/22 0339 04/27/22 0119 04/30/22 0439  AST 33 60* 68* 55* 59*  ALT 34 39 47* 45* 45*  ALKPHOS 58 64 98 104 132*  BILITOT 3.8* 4.7* 5.7* 7.7* 7.7*  PROT 5.1* 4.4* 4.5* 4.3* 4.6*  ALBUMIN 3.2* 3.0* 2.9* 2.8* 2.7*   No results for input(s): "LIPASE", "AMYLASE" in the last 168 hours. Recent Labs  Lab 04/26/22 1428 04/27/22 0119 04/30/22 0439  AMMONIA 194* 95* 126*   Diabetic: No results for input(s): "HGBA1C" in the last 72 hours. Recent Labs  Lab 04/23/22 1923  04/23/22 2353 05/01/2022 0353 05/01/2022 0729 04/22/2022 1526  GLUCAP 136* 143* 120* 108* 109*   Cardiac Enzymes: No results for input(s): "CKTOTAL", "CKMB", "CKMBINDEX", "TROPONINI" in the last 168 hours. No results for input(s): "PROBNP" in the last 8760 hours. Coagulation Profile: Recent Labs  Lab 05/16/2022 0325 04/25/22 0119 04/30/22 0439  INR 2.1* 2.2* 2.3*   Thyroid Function Tests: No results for input(s): "TSH", "T4TOTAL", "FREET4", "T3FREE", "THYROIDAB" in the last 72 hours. Lipid Profile: No results for input(s): "CHOL", "HDL", "LDLCALC", "TRIG", "CHOLHDL", "LDLDIRECT" in the last 72 hours. Anemia Panel: No results for input(s): "VITAMINB12", "FOLATE", "FERRITIN", "TIBC", "IRON", "RETICCTPCT" in the last 72 hours. Urine analysis:    Component Value Date/Time   COLORURINE AMBER (A) 04/19/2022 0405   APPEARANCEUR CLEAR 04/19/2022 0405   LABSPEC 1.019 04/19/2022 0405   PHURINE 5.0 04/19/2022 0405   GLUCOSEU NEGATIVE 04/19/2022 0405   HGBUR NEGATIVE 04/19/2022 0405   BILIRUBINUR NEGATIVE 04/19/2022 0405   KETONESUR NEGATIVE 04/19/2022 0405   PROTEINUR NEGATIVE 04/19/2022 0405   UROBILINOGEN 1.0 11/27/2007 1029   NITRITE NEGATIVE 04/19/2022 0405   LEUKOCYTESUR NEGATIVE 04/19/2022 0405   Sepsis Labs: Invalid input(s): "PROCALCITONIN", "LACTICIDVEN"  Microbiology: No results found for this or any previous visit (from the past 240 hour(s)).  Radiology Studies: No results found.    Peter Rossa T. [place]  If 7PM-7AM, please contact night-coverage [email or web address] 04/30/2022, 4:19 PM

## 2022-05-01 ENCOUNTER — Other Ambulatory Visit (HOSPITAL_COMMUNITY): Payer: Medicare HMO

## 2022-05-01 ENCOUNTER — Inpatient Hospital Stay (HOSPITAL_COMMUNITY): Payer: Medicare HMO

## 2022-05-01 DIAGNOSIS — D696 Thrombocytopenia, unspecified: Secondary | ICD-10-CM | POA: Diagnosis not present

## 2022-05-01 DIAGNOSIS — R531 Weakness: Secondary | ICD-10-CM | POA: Diagnosis not present

## 2022-05-01 DIAGNOSIS — R051 Acute cough: Secondary | ICD-10-CM

## 2022-05-01 DIAGNOSIS — Z7189 Other specified counseling: Secondary | ICD-10-CM | POA: Diagnosis not present

## 2022-05-01 DIAGNOSIS — K7290 Hepatic failure, unspecified without coma: Secondary | ICD-10-CM | POA: Diagnosis not present

## 2022-05-01 DIAGNOSIS — K7031 Alcoholic cirrhosis of liver with ascites: Secondary | ICD-10-CM | POA: Diagnosis not present

## 2022-05-01 DIAGNOSIS — K7460 Unspecified cirrhosis of liver: Secondary | ICD-10-CM | POA: Diagnosis not present

## 2022-05-01 DIAGNOSIS — K7682 Hepatic encephalopathy: Secondary | ICD-10-CM | POA: Diagnosis not present

## 2022-05-01 LAB — AMMONIA: Ammonia: 119 umol/L — ABNORMAL HIGH (ref 9–35)

## 2022-05-01 LAB — COMPREHENSIVE METABOLIC PANEL
ALT: 46 U/L — ABNORMAL HIGH (ref 0–44)
AST: 59 U/L — ABNORMAL HIGH (ref 15–41)
Albumin: 2.7 g/dL — ABNORMAL LOW (ref 3.5–5.0)
Alkaline Phosphatase: 127 U/L — ABNORMAL HIGH (ref 38–126)
Anion gap: 9 (ref 5–15)
BUN: 13 mg/dL (ref 8–23)
CO2: 21 mmol/L — ABNORMAL LOW (ref 22–32)
Calcium: 9.1 mg/dL (ref 8.9–10.3)
Chloride: 107 mmol/L (ref 98–111)
Creatinine, Ser: 0.92 mg/dL (ref 0.61–1.24)
GFR, Estimated: >60 mL/min (ref >60)
Glucose, Bld: 125 mg/dL — ABNORMAL HIGH (ref 70–99)
Potassium: 4 mmol/L (ref 3.5–5.1)
Sodium: 137 mmol/L (ref 135–145)
Total Bilirubin: 8.8 mg/dL — ABNORMAL HIGH (ref 0.3–1.2)
Total Protein: 4.2 g/dL — ABNORMAL LOW (ref 6.5–8.1)

## 2022-05-01 LAB — PROTIME-INR
INR: 2.4 — ABNORMAL HIGH (ref 0.8–1.2)
Prothrombin Time: 25.9 seconds — ABNORMAL HIGH (ref 11.4–15.2)

## 2022-05-01 LAB — CBC
HCT: 28.4 % — ABNORMAL LOW (ref 39.0–52.0)
Hemoglobin: 9.9 g/dL — ABNORMAL LOW (ref 13.0–17.0)
MCH: 34.9 pg — ABNORMAL HIGH (ref 26.0–34.0)
MCHC: 34.9 g/dL (ref 30.0–36.0)
MCV: 100 fL (ref 80.0–100.0)
Platelets: 72 10*3/uL — ABNORMAL LOW (ref 150–400)
RBC: 2.84 MIL/uL — ABNORMAL LOW (ref 4.22–5.81)
RDW: 16.3 % — ABNORMAL HIGH (ref 11.5–15.5)
WBC: 11 10*3/uL — ABNORMAL HIGH (ref 4.0–10.5)
nRBC: 0 % (ref 0.0–0.2)

## 2022-05-01 LAB — LACTIC ACID, PLASMA: Lactic Acid, Venous: 2.9 mmol/L (ref 0.5–1.9)

## 2022-05-01 MED ORDER — LACTULOSE ENEMA
300.0000 mL | Freq: Four times a day (QID) | ORAL | Status: DC
  Administered 2022-05-02: 300 mL via RECTAL
  Filled 2022-05-01 (×20): qty 300

## 2022-05-01 MED ORDER — PANTOPRAZOLE SODIUM 40 MG IV SOLR
40.0000 mg | Freq: Two times a day (BID) | INTRAVENOUS | Status: DC
  Administered 2022-05-01 – 2022-05-09 (×16): 40 mg via INTRAVENOUS
  Filled 2022-05-01 (×16): qty 10

## 2022-05-01 MED ORDER — LACTULOSE 10 GM/15ML PO SOLN
30.0000 g | Freq: Four times a day (QID) | ORAL | Status: DC
  Administered 2022-05-01 – 2022-05-04 (×12): 30 g via ORAL
  Filled 2022-05-01 (×14): qty 45

## 2022-05-01 NOTE — Progress Notes (Signed)
Daily Progress Note   Patient Name: Peter Nichols       Date: 05/01/2022 DOB: 28-Nov-1945  Age: 76 y.o. MRN#: [id number] Attending Physician: Mercy Riding, MD Primary Care Physician: [name], MD Admit Date: 04/26/2022  Reason for Consultation/Follow-up: Establishing goals of care  Patient Profile/HPI:   76 y.o. male  with past medical history of neuromuscular disorder, alcoholic cirrhosis with recurrent ascites, hepatic encephalopathy, hepatic hydrothorax, anemia and thrombocytopenia  admitted on 05/15/2022 with decompensated alcoholic hepatic cirrhosis with cystitis, hepatic hydrothorax, acute hepatic encephalopathy and physical deconditioning. He underwent paracentesis with removal of 3 L on 7/2. Right thoracocentesis with removal of 1 L on 7/6.He had TIPS placed on 7/7 by IR.  Hospital course complicated by ileus and ongoing hepatic encephalopathy.  PMT consulted to discuss goals of care.  Subjective: Chart reviewed including labs imaging and progress notes. Evaluated patient, he was somnolent, family reports he is squeezing their hand today.  He is able to swallow medications.  He ate a good amount of ice cream yesterday.  He is having minimal bowel movements even with lactulose enemas.  Another enema is scheduled for today.  His ammonia remains elevated at 119.  He had a chest x-ray indicating a recurrent right pleural effusion. Family asked about current options.  We discussed options of continued aggressive medical care with the goal being more time and life-prolonging care.  We also discussed the option of comfort focused care.  Family expressed that they do not want patient to suffer.  At the close of our discussion decision was made to continue current care for another day or  2.   Review of Systems  Unable to perform ROS: Mental acuity     Physical Exam Vitals and nursing note reviewed.  Constitutional:      Appearance: He is ill-appearing.     Comments: Frail, cachectic  Pulmonary:     Comments: cough Abdominal:     General: There is distension.  Neurological:     Comments: somnolent             Vital Signs: BP (!) 131/53 (BP Location: Right Arm)   Pulse 84   Temp 97.9 F (36.6 C) (Oral)   Resp 18   Ht 5' 9"  (1.753 m)   Wt 73.3 kg   SpO2  95%   BMI 23.86 kg/m  SpO2: SpO2: 95 % O2 Device: O2 Device: Room Air O2 Flow Rate:    Intake/output summary:  Intake/Output Summary (Last 24 hours) at 05/01/2022 1111 Last data filed at 04/30/2022 1511 Gross per 24 hour  Intake 1.83 ml  Output --  Net 1.83 ml   LBM: Last BM Date : 04/30/22 Baseline Weight: Weight: 71.2 kg Most recent weight: Weight: 73.3 kg       Palliative Assessment/Data: PPS: 20%      Patient Active Problem List   Diagnosis Date Noted   Goals of care, counseling/discussion 04/30/2022   DNR (do not resuscitate) 04/30/2022   Lactic acidosis 05/14/2022   Generalized weakness 05/09/2022   Decompensated hepatic cirrhosis (HCC)    GERD (gastroesophageal reflux disease)    Ascites of liver 01/01/2022   Anemia, unspecified 01/01/2022   Hyperglycemia 01/01/2022   Hepatic encephalopathy ([place]) 12/31/2021   Pancreatitis, acute 06/01/2021   AKI (acute kidney injury) ([place]) 05/27/2021   Near syncope 05/27/2021   Steatohepatitis, non-alcoholic    Hyperlipidemia    Overweight (BMI 25.0-29.9)    Alcoholic cirrhosis of liver with ascites ([place])    Acute hepatic encephalopathy ([place]) 05/22/2021   Thrombocytopenia ([place]) 05/04/2017    Palliative Care Assessment & Plan    Assessment/Recommendations/Plan  Continue current plan of care, full scope treatment limit has been said DNR PMT will follow with family tomorrow morning   Code Status: DNR  Prognosis:  Unable to  determine -poor prognosis for full functional recovery, if patient was transition to comfort care, anticipate his prognosis would be days to weeks  Discharge Planning: To Be Determined  Care plan was discussed with patient family and care team.   Thank you for allowing the Palliative Medicine Team to assist in the care of this patient.  Total time:  70 minutes      Greater than 50%  of this time was spent counseling and coordinating care related to the above assessment and plan.  Mariana Kaufman, AGNP-C Palliative Medicine   Please contact Palliative Medicine Team phone at [phone] for questions and concerns.

## 2022-05-01 NOTE — Progress Notes (Signed)
Referring Physician(s): Dr. [name]  Supervising Physician: Michaelle Birks  Patient Status:  [facility] - In-pt  Chief Complaint: S/p paracentesis, TIPS creation, and ectopic variceal coil embolization on 7/7 performed by Dr. Serafina Royals.  Subjective: Patient in bed with multiple family members at the bedside including wife, son, daughter and grandchildren. Patient is more somnolent today and unable to follow commands. He does not appear in any discomfort or distress.   Allergies: Ensure  Medications: Prior to Admission medications   Medication Sig Start Date End Date Taking? Authorizing Provider  b complex vitamins capsule Take 1 capsule by mouth daily.   Yes [provider]  calcium carbonate (TUMS - DOSED IN MG ELEMENTAL CALCIUM) 500 MG chewable tablet Chew 1 tablet by mouth daily as needed for indigestion or heartburn.   Yes [provider]  fluticasone (FLONASE) 50 MCG/ACT nasal spray Place 1-2 sprays into both nostrils daily as needed for allergies or rhinitis.   Yes [provider]  furosemide (LASIX) 40 MG tablet Take 40 mg by mouth daily. 01/09/22  Yes [provider]  lactulose (CHRONULAC) 10 GM/15ML solution Take 45 mLs (30 g total) by mouth 3 (three) times daily. Patient taking differently: Take 30 g by mouth 2 (two) times daily. 01/02/22  Yes Allie Bossier, MD  Multiple Vitamins-Minerals (MULTIVITAMIN MEN 50+) TABS Take 1 tablet by mouth daily with breakfast.   Yes [provider]  pantoprazole (PROTONIX) 40 MG tablet Take 40 mg by mouth daily before breakfast.   Yes [provider]  pravastatin (PRAVACHOL) 40 MG tablet Take 40 mg by mouth daily.   Yes [provider]  Protein POWD Take 1 Scoop by mouth See admin instructions. Unnamed WHEY-FREE protein powder: Mix 1 scoopful of powder into the appropriate amount of liquid and drink once a day as needed for nutritional support   Yes [provider]  rifaximin  (XIFAXAN) 550 MG TABS tablet Take 1 tablet (550 mg total) by mouth 2 (two) times daily. 01/02/22  Yes Allie Bossier, MD  spironolactone (ALDACTONE) 50 MG tablet Take 150 mg by mouth daily. 01/09/22  Yes [provider]  feeding supplement (ENSURE ENLIVE / ENSURE PLUS) LIQD Take 237 mLs by mouth daily. Patient not taking: Reported on 05/15/2022 01/02/22   Allie Bossier, MD  Multiple Vitamin (MULTIVITAMIN WITH MINERALS) TABS tablet Take 1 tablet by mouth daily. Patient not taking: Reported on 05/09/2022 05/25/21   Annita Brod, MD  Nutritional Supplements (,FEEDING SUPPLEMENT, PROSOURCE PLUS) liquid Take 30 mLs by mouth 2 (two) times daily between meals. Patient not taking: Reported on 04/28/2022 01/02/22   Allie Bossier, MD  pravastatin (PRAVACHOL) 20 MG tablet Take 3 tablets (60 mg total) by mouth daily. Patient not taking: Reported on 04/28/2022 01/03/22   Allie Bossier, MD  propranolol (INDERAL) 10 MG tablet Take 1 tablet (10 mg total) by mouth 2 (two) times daily. Patient not taking: Reported on 04/25/2022 06/03/21   Elgergawy, Silver Huguenin, MD     Vital Signs: BP (!) 115/51 (BP Location: Right Arm)   Pulse 79   Temp 97.9 F (36.6 C) (Oral)   Resp 18   Ht 5' 9"  (1.753 m)   Wt 161 lb 9.6 oz (73.3 kg)   SpO2 97%   BMI 23.86 kg/m   Physical Exam Constitutional:      General: He is not in acute distress.    Appearance: He is ill-appearing.     Comments: Does  not open eyes, does not follow any commands.   Cardiovascular:     Rate and Rhythm: Normal rate and regular rhythm.     Pulses: Normal pulses.     Heart sounds: Normal heart sounds.  Pulmonary:     Effort: Pulmonary effort is normal.     Breath sounds: Normal breath sounds.  Abdominal:     General: There is distension.     Palpations: Abdomen is soft.  Musculoskeletal:     Right lower leg: No edema.     Left lower leg: No edema.  Skin:    General: Skin is warm and dry.     Coloration: Skin is jaundiced.   Neurological:     Mental Status: He is disoriented.     Imaging: [place] ASCITES (ABDOMEN LIMITED)  Result Date: 05/01/2022 CLINICAL DATA:  Ascites EXAM: LIMITED ABDOMEN ULTRASOUND FOR ASCITES TECHNIQUE: Limited ultrasound survey for ascites was performed in all four abdominal quadrants. COMPARISON:  None available FINDINGS: Mild ascites noted in the paracolic gutters. Small bilateral pleural effusions also noted. IMPRESSION: Mild abdominal ascites. Electronically Signed   By: Miachel Roux M.D.   On: 05/01/2022 12:01   DG Chest Port 1 View  Result Date: 05/01/2022 CLINICAL DATA:  Cough. EXAM: PORTABLE CHEST 1 VIEW COMPARISON:  Chest x-ray dated April 23, 2022. FINDINGS: The heart size and mediastinal contours are within normal limits. Normal pulmonary vascularity. Increasing hazy density overlying the right mid and lower lung consistent with recurrent pleural effusion. No pneumothorax. No acute osseous abnormality. IMPRESSION: 1. Recurrent right pleural effusion. Electronically Signed   By: Titus Dubin M.D.   On: 05/01/2022 09:39   DG Abd Portable 1V  Result Date: 04/29/2022 CLINICAL DATA:  Abdominal distention. EXAM: PORTABLE ABDOMEN - 1 VIEW COMPARISON:  KUB 04/27/2022; CT abdomen and pelvis 04/20/2022 FINDINGS: Moderate fluid material within the stomach. Air and stool are seen likely within the descending colon. Mild interval decrease in air-filled distention of the small bowel. Small bowel loops again measure up to approximately 3.5 cm in caliber. A likely TIPS metallic stent again overlies the right upper abdominal quadrant. Cholecystectomy clips. Left lateral vascular coils. Chronic mid left ilium bone loss is again seen. IMPRESSION: 1. Mild interval decrease in air-filled loops of small bowel, likely mildly improved ileus. 2. Interval removal of nasogastric tube. Electronically Signed   By: Yvonne Kendall M.D.   On: 04/29/2022 15:50    Labs:  CBC: Recent Labs    04/28/22 0255  04/29/22 0652 04/30/22 0439 05/01/22 0544  WBC 6.4 7.2 8.0 11.0*  HGB 10.2* 10.5* 10.3* 9.9*  HCT 29.1* 30.0* 30.1* 28.4*  PLT 74* 79* 80* 72*    COAGS: Recent Labs    05/27/21 1634 11/07/21 1241 12/10/21 1245 12/23/21 1208 03/17/22 1147 04/19/2022 1755 04/27/2022 0325 04/25/22 0119 04/30/22 0439 05/01/22 0544  INR 1.5*   < > 1.6* 1.7* 1.7*   < > 2.1* 2.2* 2.3* 2.4*  APTT 31  --  35 34 33  --   --   --   --   --    < > = values in this interval not displayed.    BMP: Recent Labs    04/26/22 0339 04/27/22 0119 04/30/22 0439 05/01/22 0544  NA 136 135 138 137  K 4.8 4.5 4.3 4.0  CL 107 106 108 107  CO2 21* 20* 22 21*  GLUCOSE 114* 125* 123* 125*  BUN 16 15 11 13   CALCIUM 9.1 9.0 9.2 9.1  CREATININE 1.13 0.99 0.91 0.92  GFRNONAA >60 >60 >60 >60    LIVER FUNCTION TESTS: Recent Labs    04/26/22 0339 04/27/22 0119 04/30/22 0439 05/01/22 0544  BILITOT 5.7* 7.7* 7.7* 8.8*  AST 68* 55* 59* 59*  ALT 47* 45* 45* 46*  ALKPHOS 98 104 132* 127*  PROT 4.5* 4.3* 4.6* 4.2*  ALBUMIN 2.9* 2.8* 2.7* 2.7*    Assessment and Plan:  Decompensated alcoholic cirrhosis with portal hypertension, hepatic encephalopathy, hepatic hydrothorax, recurrent ascites and 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 He appears more jaundiced. He was able to have a bowel movement today after a lactulose enema. T. Bili has increased to 8.8 and ammonia level is 119. IR is concerned for possible over shunting.  I had a thorough discussion with the family today at the bedside to discuss the concern of possible over shunting. I discussed that our team would like to perform a portal venogram to assess for complete embolization of the ectopic varix arising from the splenic vein and to also assess portal pressures. If we find incomplete embolization of the ectopic varix IR would re-embolize. If the portal pressures are too low we will place a  new, smaller stent within the current shunt.   The family is aware that any intervention performed by our team may not have a significant enough impact to change the current clinical picture. The family is in agreement to try anything at this point.   IR will plan for portal venogram with possible embolization, possible stent placement, tentatively for this afternoon. The patient is NPO and not receiving any anticoagulating medications. Labs and vitals have been reviewed.   The risks and benefits of this procedure were discussed with the patient's family including, but not limited to bleeding, infection, vascular injury, post operative pain, or contrast induced renal failure.  This procedure involves the use of X-rays and because of the nature of the planned procedure, it is possible that we will have prolonged use of X-ray fluoroscopy.  Potential radiation risks to you include (but are not limited to) the following: - A slightly elevated risk for cancer several years later in life. This risk is typically less than 0.5% percent. This risk is low in comparison to the normal incidence of human cancer, which is 33% for women and 50% for men according to the [employer]. - Radiation induced injury can include skin redness, resembling a rash, tissue breakdown / ulcers and hair loss (which can be temporary or permanent).   The likelihood of either of these occurring depends on the difficulty of the procedure and whether you are sensitive to radiation due to previous procedures, disease, or genetic conditions.   IF your procedure requires a prolonged use of radiation, you will be notified and given written instructions for further action.  It is your responsibility to monitor the irradiated area for the 2 weeks following the procedure and to notify your physician if you are concerned that you have suffered a radiation induced injury.    All questions were answered, patient's family is  agreeable to proceed.  Consent signed by the patient's wife and is in the IR control room.    Electronically Signed: Soyla Dryer, AGACNP-BC [phone] 05/01/2022, 1:02 PM   I spent a total of 15 Minutes at the the patient's bedside AND on the patient's hospital floor or unit, greater than 50% of which was counseling/coordinating care s/p TIPS and ectopic variceal  coil embolization

## 2022-05-01 NOTE — Progress Notes (Signed)
PROGRESS NOTE  Peter Nichols XBD:[id number] DOB: [birth year]   PCP: [name], MD  Patient is from: Home.  DOA: 04/20/2022 LOS: 51  Chief complaints Chief Complaint  Patient presents with   Weakness     Brief Narrative / Interim history: 76 year old M with PMH of neuromuscular disorder, alcoholic cirrhosis with recurrent ascites, hepatic encephalopathy, hepatic hydrothorax, anemia and thrombocytopenia presented with weakness and admitted for decompensated alcoholic hepatic cirrhosis with cystitis, hepatic hydrothorax, acute hepatic encephalopathy and physical deconditioning.  He has lactic acidosis to 4.3.  PCCM consulted on 7/2.  He underwent paracentesis with removal of 3 L on 7/2. Right thoracocentesis with removal of 1 L on 7/6.  [facility] consulted on 7/3.  He had TIPS placed on 7/7 by IR.  Hospital course complicated by ileus requiring NG tube.   Patient with gradual decline, worsening hepatic encephalopathy, coagulopathy and failure to thrive picture.  Poor prognosis.  CODE STATUS changed to DNR/DNI after discussion with patient's wife and daughter at bedside.  Palliative medicine consulted for further goals of care discussion.   Subjective: Seen and examined earlier this morning.  Patient's daughter at bedside.  Patient is very lethargic and not following commands or answering questions.  Does not appear to be in distress.  Patient's daughter reports some cough last night.  He had a small bowel movements after lactulose enema yesterday.  Objective: Vitals:   05/01/22 0400 05/01/22 0856 05/01/22 1213 05/01/22 1633  BP: (!) 123/52 (!) 131/53 (!) 115/51 (!) 130/51  Pulse: 85 84 79 79  Resp: 20 18 18 17   Temp: 97.9 F (36.6 C)  97.9 F (36.6 C) 97.7 F (36.5 C)  TempSrc: Oral Oral Oral Oral  SpO2: 95% 95% 97% 97%  Weight:      Height:        Examination:  GENERAL: Frail, chronically ill-appearing and lethargic. HEENT: MMM.  Vision and hearing grossly intact.  NECK:  Supple.  No apparent JVD.  RESP: 97% on RA.  No IWOB.  Fair aeration bilaterally. CVS:  RRR. Heart sounds normal.  ABD/GI/GU: BS+. Abd full and soft.  No tenderness. MSK/EXT:  Moves extremities. No apparent deformity. No edema.  SKIN: Skin jaundice.  Skin bruises. NEURO: Sleepy and lethargic.  Not arousable.  Does not follow commands.  PSYCH: No distress or agitation.  Procedures:  7/2-paracentesis with removal of 3 L 7/6-right thoracocentesis with removal of 1 L 7/7-TIPS placement by IR  Microbiology summarized: 7/2-blood cultures NGTD. 7/2-peritoneal fluid culture NGTD.  Assessment and plan: Principal Problem:   Decompensated hepatic cirrhosis (HCC) Active Problems:   Thrombocytopenia (HCC)   Steatohepatitis, non-alcoholic   Alcoholic cirrhosis of liver with ascites (HCC)   Anemia, unspecified   Lactic acidosis   GERD (gastroesophageal reflux disease)   Generalized weakness   Goals of care, counseling/discussion   DNR (do not resuscitate)  Decompensated alcoholic hepatic cirrhosis with recurrent ascites and hepatic hydrothorax Elevated liver enzymes/hyperbilirubinemia: Bilirubin rising. Coagulopathy: INR 2.3. -S/p paracentesis with removal of 3 L on 7/2.  [place] on 7/14 with mild abdominal ascites. -S/p TIPS by IR on 7/7. -Previously turned down by [facility] for liver transplant due to age. -Inderal, Lasix and Aldactone held due to soft BP. -IR planning portal venogram with possible embolization, possible stent placement  Acute hepatic encephalopathy and ileus after TIPS-seems to be worse.  Difficult to arouse.  Does not follow command either. -Lactulose enema 4 times daily -Change Protonix to IV -Reorientation and delirium precautions -Avoid sedating  medications  Normocytic anemia/thrombocytopenia: Likely due to liver cirrhosis.  Stable. -Continue monitoring  Cough: CXR with recurrent right pleural effusion likely from hydrothorax. -Supportive care  Physical  deconditioning:   Lactic acidosis: Likely due to perfusion and decompensated liver cirrhosis.    Hyperkalemia/hyponatremia: Resolved.  Goal of care counseling: DNR/DNI.  Grim prognosis -Palliative medicine following  Body mass index is 23.86 kg/m.         DVT prophylaxis:  SCDs Start: 05/04/2022 2044  Code Status: Full code Family Communication: Updated patient's daughter at bedside. Level of care: Telemetry Medical Status is: Inpatient Remains inpatient appropriate because: Decompensated liver cirrhosis, hepatic encephalopathy and physical deconditioning   Final disposition: TBD Consultants:  Gastroenterology Pulmonology Interventional radiology Palliative medicine  Sch Meds:  Scheduled Meds:  lactulose  30 g Oral QID   Or   lactulose  300 mL Rectal QID   pantoprazole (PROTONIX) IV  40 mg Intravenous Q12H   rifaximin  550 mg Oral BID   Continuous Infusions:  sodium chloride Stopped (04/20/2022 1507)   PRN Meds:.acetaminophen **OR** acetaminophen, albuterol, guaiFENesin, [DISCONTINUED] ondansetron **OR** ondansetron (ZOFRAN) IV, mouth rinse  Antimicrobials: Anti-infectives (From admission, onward)    Start     Dose/Rate Route Frequency Ordered Stop   04/28/22 1000  rifaximin (XIFAXAN) tablet 550 mg        550 mg Oral 2 times daily 04/28/22 0741     04/19/22 1000  rifaximin (XIFAXAN) tablet 550 mg  Status:  Discontinued        550 mg Oral 2 times daily 05/12/2022 2043 04/26/22 1750   05/12/2022 1715  cefTRIAXone (ROCEPHIN) 2 g in sodium chloride 0.9 % 100 mL IVPB  Status:  Discontinued        2 g 200 mL/hr over 30 Minutes Intravenous Every 24 hours 05/10/2022 1712 04/25/22 1514        I have personally reviewed the following labs and images: CBC: Recent Labs  Lab 04/25/22 0119 04/26/22 0248 04/27/22 0119 04/28/22 0255 04/29/22 0652 04/30/22 0439 05/01/22 0544  WBC 6.0 6.8 6.2 6.4 7.2 8.0 11.0*  NEUTROABS 4.4 4.6 4.4 4.4 4.9  --   --   HGB 9.3* 9.7* 10.0*  10.2* 10.5* 10.3* 9.9*  HCT 27.8* 28.2* 28.4* 29.1* 30.0* 30.1* 28.4*  MCV 103.7* 102.2* 101.8* 100.3* 99.7 101.0* 100.0  PLT 72* 66* 70* 74* 79* 80* 72*   BMP &GFR Recent Labs  Lab 04/25/22 0119 04/26/22 0339 04/27/22 0119 04/30/22 0439 05/01/22 0544  NA 134* 136 135 138 137  K 4.4 4.8 4.5 4.3 4.0  CL 108 107 106 108 107  CO2 19* 21* 20* 22 21*  GLUCOSE 164* 114* 125* 123* 125*  BUN 17 16 15 11 13   CREATININE 1.12 1.13 0.99 0.91 0.92  CALCIUM 9.0 9.1 9.0 9.2 9.1  MG  --   --   --  1.9  --   PHOS  --   --   --  3.1  --    Estimated Creatinine Clearance: 69.4 mL/min (by C-G formula based on SCr of 0.92 mg/dL). Liver & Pancreas: Recent Labs  Lab 04/25/22 0119 04/26/22 0339 04/27/22 0119 04/30/22 0439 05/01/22 0544  AST 60* 68* 55* 59* 59*  ALT 39 47* 45* 45* 46*  ALKPHOS 64 98 104 132* 127*  BILITOT 4.7* 5.7* 7.7* 7.7* 8.8*  PROT 4.4* 4.5* 4.3* 4.6* 4.2*  ALBUMIN 3.0* 2.9* 2.8* 2.7* 2.7*   No results for input(s): "LIPASE", "AMYLASE" in the last 168 hours.  Recent Labs  Lab 04/26/22 1428 04/27/22 0119 04/30/22 0439 05/01/22 0544  AMMONIA 194* 95* 126* 119*   Diabetic: No results for input(s): "HGBA1C" in the last 72 hours. No results for input(s): "GLUCAP" in the last 168 hours.  Cardiac Enzymes: No results for input(s): "CKTOTAL", "CKMB", "CKMBINDEX", "TROPONINI" in the last 168 hours. No results for input(s): "PROBNP" in the last 8760 hours. Coagulation Profile: Recent Labs  Lab 04/25/22 0119 04/30/22 0439 05/01/22 0544  INR 2.2* 2.3* 2.4*   Thyroid Function Tests: No results for input(s): "TSH", "T4TOTAL", "FREET4", "T3FREE", "THYROIDAB" in the last 72 hours. Lipid Profile: No results for input(s): "CHOL", "HDL", "LDLCALC", "TRIG", "CHOLHDL", "LDLDIRECT" in the last 72 hours. Anemia Panel: No results for input(s): "VITAMINB12", "FOLATE", "FERRITIN", "TIBC", "IRON", "RETICCTPCT" in the last 72 hours. Urine analysis:    Component Value Date/Time    COLORURINE AMBER (A) 04/19/2022 0405   APPEARANCEUR CLEAR 04/19/2022 0405   LABSPEC 1.019 04/19/2022 0405   PHURINE 5.0 04/19/2022 0405   GLUCOSEU NEGATIVE 04/19/2022 0405   HGBUR NEGATIVE 04/19/2022 0405   BILIRUBINUR NEGATIVE 04/19/2022 0405   KETONESUR NEGATIVE 04/19/2022 0405   PROTEINUR NEGATIVE 04/19/2022 0405   UROBILINOGEN 1.0 11/27/2007 1029   NITRITE NEGATIVE 04/19/2022 0405   LEUKOCYTESUR NEGATIVE 04/19/2022 0405   Sepsis Labs: Invalid input(s): "PROCALCITONIN", "LACTICIDVEN"  Microbiology: No results found for this or any previous visit (from the past 240 hour(s)).  Radiology Studies: [place] ASCITES (ABDOMEN LIMITED)  Result Date: 05/01/2022 CLINICAL DATA:  Ascites EXAM: LIMITED ABDOMEN ULTRASOUND FOR ASCITES TECHNIQUE: Limited ultrasound survey for ascites was performed in all four abdominal quadrants. COMPARISON:  None available FINDINGS: Mild ascites noted in the paracolic gutters. Small bilateral pleural effusions also noted. IMPRESSION: Mild abdominal ascites. Electronically Signed   By: Miachel Roux M.D.   On: 05/01/2022 12:01   DG Chest Port 1 View  Result Date: 05/01/2022 CLINICAL DATA:  Cough. EXAM: PORTABLE CHEST 1 VIEW COMPARISON:  Chest x-ray dated April 23, 2022. FINDINGS: The heart size and mediastinal contours are within normal limits. Normal pulmonary vascularity. Increasing hazy density overlying the right mid and lower lung consistent with recurrent pleural effusion. No pneumothorax. No acute osseous abnormality. IMPRESSION: 1. Recurrent right pleural effusion. Electronically Signed   By: Titus Dubin M.D.   On: 05/01/2022 09:39      Jerilynn Feldmeier T. [place]  If 7PM-7AM, please contact night-coverage [email or web address] 05/01/2022, 4:57 PM

## 2022-05-01 NOTE — Plan of Care (Signed)
  Problem: Education: Goal: Knowledge of General Education information will improve Description: Including pain rating scale, medication(s)/side effects and non-pharmacologic comfort measures Outcome: Not Progressing   Problem: Health Behavior/Discharge Planning: Goal: Ability to manage health-related needs will improve Outcome: Not Progressing   Problem: Clinical Measurements: Goal: Ability to maintain clinical measurements within normal limits will improve Outcome: Not Progressing   Problem: Activity: Goal: Risk for activity intolerance will decrease Outcome: Not Progressing   Problem: Nutrition: Goal: Adequate nutrition will be maintained Outcome: Not Progressing   Problem: Coping: Goal: Level of anxiety will decrease Outcome: Progressing   Problem: Elimination: Goal: Will not experience complications related to bowel motility Outcome: Not Progressing   Problem: Pain Managment: Goal: General experience of comfort will improve Outcome: Progressing   Problem: Safety: Goal: Ability to remain free from injury will improve Outcome: Progressing

## 2022-05-02 ENCOUNTER — Inpatient Hospital Stay (HOSPITAL_COMMUNITY): Payer: Medicare HMO

## 2022-05-02 DIAGNOSIS — D696 Thrombocytopenia, unspecified: Secondary | ICD-10-CM | POA: Diagnosis not present

## 2022-05-02 DIAGNOSIS — Z7189 Other specified counseling: Secondary | ICD-10-CM | POA: Diagnosis not present

## 2022-05-02 DIAGNOSIS — K7031 Alcoholic cirrhosis of liver with ascites: Secondary | ICD-10-CM | POA: Diagnosis not present

## 2022-05-02 DIAGNOSIS — K7682 Hepatic encephalopathy: Secondary | ICD-10-CM | POA: Diagnosis not present

## 2022-05-02 DIAGNOSIS — K7460 Unspecified cirrhosis of liver: Secondary | ICD-10-CM | POA: Diagnosis not present

## 2022-05-02 DIAGNOSIS — K7290 Hepatic failure, unspecified without coma: Secondary | ICD-10-CM | POA: Diagnosis not present

## 2022-05-02 DIAGNOSIS — R531 Weakness: Secondary | ICD-10-CM | POA: Diagnosis not present

## 2022-05-02 HISTORY — PX: IR US GUIDE VASC ACCESS RIGHT: IMG2390

## 2022-05-02 HISTORY — PX: IR TIPS REVISION MOD SED: IMG2296

## 2022-05-02 HISTORY — PX: IR PARACENTESIS: IMG2679

## 2022-05-02 LAB — PROTIME-INR
INR: 2.3 — ABNORMAL HIGH (ref 0.8–1.2)
Prothrombin Time: 24.9 seconds — ABNORMAL HIGH (ref 11.4–15.2)

## 2022-05-02 LAB — COMPREHENSIVE METABOLIC PANEL
ALT: 45 U/L — ABNORMAL HIGH (ref 0–44)
AST: 64 U/L — ABNORMAL HIGH (ref 15–41)
Albumin: 2.8 g/dL — ABNORMAL LOW (ref 3.5–5.0)
Alkaline Phosphatase: 130 U/L — ABNORMAL HIGH (ref 38–126)
Anion gap: 7 (ref 5–15)
BUN: 13 mg/dL (ref 8–23)
CO2: 19 mmol/L — ABNORMAL LOW (ref 22–32)
Calcium: 9.1 mg/dL (ref 8.9–10.3)
Chloride: 110 mmol/L (ref 98–111)
Creatinine, Ser: 0.72 mg/dL (ref 0.61–1.24)
GFR, Estimated: >60 mL/min (ref >60)
Glucose, Bld: 118 mg/dL — ABNORMAL HIGH (ref 70–99)
Potassium: 4.4 mmol/L (ref 3.5–5.1)
Sodium: 136 mmol/L (ref 135–145)
Total Bilirubin: 9.8 mg/dL — ABNORMAL HIGH (ref 0.3–1.2)
Total Protein: 4.5 g/dL — ABNORMAL LOW (ref 6.5–8.1)

## 2022-05-02 LAB — CBC
HCT: 29.8 % — ABNORMAL LOW (ref 39.0–52.0)
Hemoglobin: 10.2 g/dL — ABNORMAL LOW (ref 13.0–17.0)
MCH: 34.7 pg — ABNORMAL HIGH (ref 26.0–34.0)
MCHC: 34.2 g/dL (ref 30.0–36.0)
MCV: 101.4 fL — ABNORMAL HIGH (ref 80.0–100.0)
Platelets: 76 10*3/uL — ABNORMAL LOW (ref 150–400)
RBC: 2.94 MIL/uL — ABNORMAL LOW (ref 4.22–5.81)
RDW: 16.6 % — ABNORMAL HIGH (ref 11.5–15.5)
WBC: 8.1 10*3/uL (ref 4.0–10.5)
nRBC: 0 % (ref 0.0–0.2)

## 2022-05-02 LAB — MAGNESIUM: Magnesium: 2 mg/dL (ref 1.7–2.4)

## 2022-05-02 LAB — AMMONIA: Ammonia: 124 umol/L — ABNORMAL HIGH (ref 9–35)

## 2022-05-02 MED ORDER — LIDOCAINE HCL 1 % IJ SOLN
INTRAMUSCULAR | Status: AC
  Filled 2022-05-02: qty 20

## 2022-05-02 MED ORDER — MIDAZOLAM HCL 2 MG/2ML IJ SOLN
INTRAMUSCULAR | Status: AC | PRN
  Administered 2022-05-02 (×2): .5 mg via INTRAVENOUS

## 2022-05-02 MED ORDER — FENTANYL CITRATE (PF) 100 MCG/2ML IJ SOLN
INTRAMUSCULAR | Status: AC | PRN
  Administered 2022-05-02 (×2): 25 ug via INTRAVENOUS

## 2022-05-02 MED ORDER — IOHEXOL 300 MG/ML  SOLN
100.0000 mL | Freq: Once | INTRAMUSCULAR | Status: AC | PRN
  Administered 2022-05-02: 15 mL via INTRAVENOUS

## 2022-05-02 MED ORDER — MIDAZOLAM HCL 2 MG/2ML IJ SOLN
INTRAMUSCULAR | Status: AC
  Filled 2022-05-02: qty 2

## 2022-05-02 MED ORDER — CEFAZOLIN SODIUM-DEXTROSE 2-4 GM/100ML-% IV SOLN
INTRAVENOUS | Status: AC | PRN
  Administered 2022-05-02: 2 g via INTRAVENOUS

## 2022-05-02 MED ORDER — IOHEXOL 300 MG/ML  SOLN
100.0000 mL | Freq: Once | INTRAMUSCULAR | Status: AC | PRN
  Administered 2022-05-02: 65 mL via INTRAVENOUS

## 2022-05-02 MED ORDER — CEFAZOLIN SODIUM-DEXTROSE 2-4 GM/100ML-% IV SOLN
INTRAVENOUS | Status: AC
  Filled 2022-05-02: qty 100

## 2022-05-02 MED ORDER — FENTANYL CITRATE (PF) 100 MCG/2ML IJ SOLN
INTRAMUSCULAR | Status: AC
  Filled 2022-05-02: qty 2

## 2022-05-02 NOTE — Progress Notes (Signed)
PROGRESS NOTE  Peter Nichols HRC:[id number] DOB: 07/13/1946   PCP: [name], MD  Patient is from: Home.  DOA: 04/29/2022 LOS: 27  Chief complaints Chief Complaint  Patient presents with   Weakness     Brief Narrative / Interim history: 76 year old M with PMH of neuromuscular disorder, alcoholic cirrhosis with recurrent ascites, hepatic encephalopathy, hepatic hydrothorax, anemia and thrombocytopenia presented with weakness and admitted for decompensated alcoholic hepatic cirrhosis with cystitis, hepatic hydrothorax, acute hepatic encephalopathy and physical deconditioning.  He has lactic acidosis to 4.3.  PCCM consulted on 7/2.  He underwent paracentesis with removal of 3 L on 7/2. Right thoracocentesis with removal of 1 L on 7/6.  [facility] consulted on 7/3.  He had TIPS placed on 7/7 by IR.  Hospital course complicated by ileus requiring NG tube.   Patient with gradual decline, worsening hepatic encephalopathy, coagulopathy and failure to thrive picture.  Poor prognosis.  CODE STATUS changed to DNR/DNI after discussion with patient's wife and daughter at bedside.  Palliative medicine following.  7/15-TIPS revision with constrainment and therapeutic paracentesis with removal of 1.95 L.    Subjective: Seen and examined earlier this afternoon after he returned from TIPS revision and paracentesis.  Patient is very somnolent and not arousable.  Only moans to loud voice.  Multiple family members at bedside.  Objective: Vitals:   05/02/22 1145 05/02/22 1236 05/02/22 1246 05/02/22 1600  BP: 112/70  (!) 114/49 (!) 111/48  Pulse: 84  75 74  Resp: 16  14 17   Temp:   98 F (36.7 C)   TempSrc:  Oral Oral   SpO2: 100%  94% 99%  Weight:      Height:        Examination:  GENERAL: Frail.  Chronically ill-appearing and lethargic. HEENT: MMM.  Vision and hearing grossly intact.  NECK: Supple.  No apparent JVD.  RESP:  No IWOB.  Fair aeration bilaterally. CVS:  RRR. Heart sounds  normal.  ABD/GI/GU: BS+. Abd soft, NTND.  MSK/EXT: Significant muscle mass and subcu fat loss. SKIN: Skin jaundice. NEURO: Very somnolent and difficult to arouse.  Does not follow command.  No apparent focal neuro deficit. PSYCH: Calm. Normal affect.   Procedures:  7/2-paracentesis with removal of 3 L 7/6-right thoracocentesis with removal of 1 L 7/7-TIPS placement by IR 7/15-TIPS revision with constrainment, by placement of tandem 84m VBX and 955mVIABAHN GORE stents. 7/15-paracentesis with removal of 1.95 L.  Microbiology summarized: 7/2-blood cultures NGTD. 7/2-peritoneal fluid culture NGTD.  Assessment and plan: Principal Problem:   Decompensated hepatic cirrhosis (HCC) Active Problems:   Thrombocytopenia (HCC)   Steatohepatitis, non-alcoholic   Alcoholic cirrhosis of liver with ascites (HCC)   Anemia, unspecified   Lactic acidosis   GERD (gastroesophageal reflux disease)   Generalized weakness   Goals of care, counseling/discussion   DNR (do not resuscitate)  Decompensated alcoholic hepatic cirrhosis with recurrent ascites and hepatic hydrothorax Elevated liver enzymes/hyperbilirubinemia: Bilirubin rising. Coagulopathy: INR 2.3. Acute hepatic encephalopathy and ileus after TIPS-progressive.  Now somnolent. -S/p paracentesis with removal of 3 L on 7/2 and 1.95 L on 7/15. -S/p TIPS by IR on 7/7. -S/p TIPS revision with constrainment on 7/15 -[facility]. -Inderal, Lasix and Aldactone held due to soft BP. -Lactulose enema 4 times daily -Changed Protonix to IV -Avoid sedating medications -GI signed off  Normocytic anemia/thrombocytopenia: Likely due to liver cirrhosis.  Stable. -Continue monitoring  Cough: CXR with recurrent  right pleural effusion likely from hydrothorax.  He is also at risk for aspiration. -Supportive care   Lactic acidosis: Likely due to perfusion and decompensated liver cirrhosis.     Hyperkalemia/hyponatremia: Resolved.  Goal of care counseling/physical deconditioning: DNR/DNI.  Grim prognosis -Palliative medicine following  Body mass index is 23.86 kg/m.         DVT prophylaxis:  SCDs Start: 04/27/2022 2044  Code Status: Full code Family Communication: Updated wife, daughters, son and other family members at bedside Level of care: Telemetry Medical Status is: Inpatient Remains inpatient appropriate because: Decompensated liver cirrhosis, hepatic encephalopathy and physical deconditioning   Final disposition: TBD Consultants:  Gastroenterology-signed off Pulmonology-signed off Interventional radiology Palliative medicine  Sch Meds:  Scheduled Meds:  lactulose  30 g Oral QID   Or   lactulose  300 mL Rectal QID   lidocaine       pantoprazole (PROTONIX) IV  40 mg Intravenous Q12H   rifaximin  550 mg Oral BID   Continuous Infusions:  sodium chloride Stopped (04/29/2022 1507)   PRN Meds:.acetaminophen **OR** acetaminophen, albuterol, guaiFENesin, lidocaine, [DISCONTINUED] ondansetron **OR** ondansetron (ZOFRAN) IV, mouth rinse  Antimicrobials: Anti-infectives (From admission, onward)    Start     Dose/Rate Route Frequency Ordered Stop   05/02/22 0943  ceFAZolin (ANCEF) IVPB 2g/100 mL premix        over 30 Minutes Intravenous Continuous PRN 05/02/22 0944 05/02/22 0943   04/28/22 1000  rifaximin (XIFAXAN) tablet 550 mg        550 mg Oral 2 times daily 04/28/22 0741     04/19/22 1000  rifaximin (XIFAXAN) tablet 550 mg  Status:  Discontinued        550 mg Oral 2 times daily 04/27/2022 2043 04/26/22 1750   05/04/2022 1715  cefTRIAXone (ROCEPHIN) 2 g in sodium chloride 0.9 % 100 mL IVPB  Status:  Discontinued        2 g 200 mL/hr over 30 Minutes Intravenous Every 24 hours 05/09/2022 1712 04/25/22 1514        I have personally reviewed the following labs and images: CBC: Recent Labs  Lab 04/26/22 0248 04/27/22 0119 04/28/22 0255 04/29/22 0652  04/30/22 0439 05/01/22 0544 05/02/22 0124  WBC 6.8 6.2 6.4 7.2 8.0 11.0* 8.1  NEUTROABS 4.6 4.4 4.4 4.9  --   --   --   HGB 9.7* 10.0* 10.2* 10.5* 10.3* 9.9* 10.2*  HCT 28.2* 28.4* 29.1* 30.0* 30.1* 28.4* 29.8*  MCV 102.2* 101.8* 100.3* 99.7 101.0* 100.0 101.4*  PLT 66* 70* 74* 79* 80* 72* 76*   BMP &GFR Recent Labs  Lab 04/26/22 0339 04/27/22 0119 04/30/22 0439 05/01/22 0544 05/02/22 0124  NA 136 135 138 137 136  K 4.8 4.5 4.3 4.0 4.4  CL 107 106 108 107 110  CO2 21* 20* 22 21* 19*  GLUCOSE 114* 125* 123* 125* 118*  BUN 16 15 11 13 13   CREATININE 1.13 0.99 0.91 0.92 0.72  CALCIUM 9.1 9.0 9.2 9.1 9.1  MG  --   --  1.9  --  2.0  PHOS  --   --  3.1  --   --    Estimated Creatinine Clearance: 79.8 mL/min (by C-G formula based on SCr of 0.72 mg/dL). Liver & Pancreas: Recent Labs  Lab 04/26/22 0339 04/27/22 0119 04/30/22 0439 05/01/22 0544 05/02/22 0124  AST 68* 55* 59* 59* 64*  ALT 47* 45* 45* 46* 45*  ALKPHOS 98 104 132* 127* 130*  BILITOT 5.7*  7.7* 7.7* 8.8* 9.8*  PROT 4.5* 4.3* 4.6* 4.2* 4.5*  ALBUMIN 2.9* 2.8* 2.7* 2.7* 2.8*   No results for input(s): "LIPASE", "AMYLASE" in the last 168 hours. Recent Labs  Lab 04/26/22 1428 04/27/22 0119 04/30/22 0439 05/01/22 0544 05/02/22 0124  AMMONIA 194* 95* 126* 119* 124*   Diabetic: No results for input(s): "HGBA1C" in the last 72 hours. No results for input(s): "GLUCAP" in the last 168 hours.  Cardiac Enzymes: No results for input(s): "CKTOTAL", "CKMB", "CKMBINDEX", "TROPONINI" in the last 168 hours. No results for input(s): "PROBNP" in the last 8760 hours. Coagulation Profile: Recent Labs  Lab 04/30/22 0439 05/01/22 0544 05/02/22 0124  INR 2.3* 2.4* 2.3*   Thyroid Function Tests: No results for input(s): "TSH", "T4TOTAL", "FREET4", "T3FREE", "THYROIDAB" in the last 72 hours. Lipid Profile: No results for input(s): "CHOL", "HDL", "LDLCALC", "TRIG", "CHOLHDL", "LDLDIRECT" in the last 72 hours. Anemia  Panel: No results for input(s): "VITAMINB12", "FOLATE", "FERRITIN", "TIBC", "IRON", "RETICCTPCT" in the last 72 hours. Urine analysis:    Component Value Date/Time   COLORURINE AMBER (A) 04/19/2022 0405   APPEARANCEUR CLEAR 04/19/2022 0405   LABSPEC 1.019 04/19/2022 0405   PHURINE 5.0 04/19/2022 0405   GLUCOSEU NEGATIVE 04/19/2022 0405   HGBUR NEGATIVE 04/19/2022 0405   BILIRUBINUR NEGATIVE 04/19/2022 0405   KETONESUR NEGATIVE 04/19/2022 0405   PROTEINUR NEGATIVE 04/19/2022 0405   UROBILINOGEN 1.0 11/27/2007 1029   NITRITE NEGATIVE 04/19/2022 0405   LEUKOCYTESUR NEGATIVE 04/19/2022 0405   Sepsis Labs: Invalid input(s): "PROCALCITONIN", "LACTICIDVEN"  Microbiology: No results found for this or any previous visit (from the past 240 hour(s)).  Radiology Studies: No results found.    Peter Nichols  If 7PM-7AM, please contact night-coverage [email or web address] 05/02/2022, 4:35 PM

## 2022-05-02 NOTE — Sedation Documentation (Addendum)
Handoff report received from Verdel, NP. Patient resting with eyes closed and does not appear to be in any distress or discomfort. Vital signs stable.

## 2022-05-02 NOTE — Sedation Documentation (Signed)
Procedure complete. Patient transferred back to inpatient bed. Dressing clean, dry, intact. Report called to patient nurse Lilia Pro. Vital signs stable.

## 2022-05-02 NOTE — Progress Notes (Signed)
[name] Gastroenterology Progress Note  Peter Nichols 76 y.o. [birth year]   Subjective: S/P TIPS revision today. Patient somnolent. Multiple family members by bedside.  Objective: Vital signs: Vitals:   05/02/22 1145 05/02/22 1246  BP: 112/70 (!) 114/49  Pulse: 84 75  Resp: 16 14  Temp:  98 F (36.7 C)  SpO2: 100% 94%    Physical Exam: Gen: somnolent, elderly, thin, no acute distress CV: RRR Chest: CTA B Abd: mild distention, nontender (no facial grimace), +BS Ext: no edema  Lab Results: Recent Labs    04/30/22 0439 05/01/22 0544 05/02/22 0124  NA 138 137 136  K 4.3 4.0 4.4  CL 108 107 110  CO2 22 21* 19*  GLUCOSE 123* 125* 118*  BUN 11 13 13   CREATININE 0.91 0.92 0.72  CALCIUM 9.2 9.1 9.1  MG 1.9  --  2.0  PHOS 3.1  --   --    Recent Labs    05/01/22 0544 05/02/22 0124  AST 59* 64*  ALT 46* 45*  ALKPHOS 127* 130*  BILITOT 8.8* 9.8*  PROT 4.2* 4.5*  ALBUMIN 2.7* 2.8*   Recent Labs    05/01/22 0544 05/02/22 0124  WBC 11.0* 8.1  HGB 9.9* 10.2*  HCT 28.4* 29.8*  MCV 100.0 101.4*  PLT 72* 76*      Assessment/Plan: Decompensated cirrhosis with severe hepatic encephalopathy following TIPS. S/P TIPS revision today. Therapeutic paracentesis with 1.95 L removed by radiology. On Lactulose. Continue supportive care. Agree with palliative med involvement. No new GI recs. Will sign off. Call us back if questions.   Peter Nichols 05/02/2022, 12:53 PM  Questions please call [phone] Patient ID: Peter Nichols, male   DOB: 1946-04-25, 76 y.o.   MRN: [id number]

## 2022-05-02 NOTE — Procedures (Signed)
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 - Successful TIPS revision with constrainment, by placement of tandem 40m VBX and 922mVIABAHN GORE stents. 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   Plan: - Bedrest x1 hrs. No HOB restrictions. - Lactulose TID and monitor for hepatic encephalopathy. - NH3 in AM.  Final report to follow once all images are reviewed and compared with previous studies.  See detailed dictation with images in PACS. The patient tolerated the procedure well without incident or complication and was returned to Recovery in stable condition.    JoMichaelle BirksMD [facility]   Pager. [phone]

## 2022-05-02 NOTE — Progress Notes (Addendum)
Daily Progress Note   Patient Name: Peter Nichols       Date: 05/02/2022 DOB: July 04, 1946  Age: 76 y.o. MRN#: [id number] Attending Physician: Mercy Riding, MD Primary Care Physician: [name], MD Admit Date: 05/15/2022  Reason for Consultation/Follow-up: Establishing goals of care  Patient Profile/HPI:   76 y.o. male  with past medical history of neuromuscular disorder, alcoholic cirrhosis with recurrent ascites, hepatic encephalopathy, hepatic hydrothorax, anemia and thrombocytopenia  admitted on 04/23/2022 with decompensated alcoholic hepatic cirrhosis with cystitis, hepatic hydrothorax, acute hepatic encephalopathy and physical deconditioning. He underwent paracentesis with removal of 3 L on 7/2. Right thoracocentesis with removal of 1 L on 7/6.He had TIPS placed on 7/7 by IR.  Hospital course complicated by ileus and ongoing hepatic encephalopathy.  PMT consulted to discuss goals of care.  Subjective: Chart reviewed including labs imaging and progress notes.  Noted he had TIPS revision today with increase in portal blood flow.  Ammonia remains elevated. Evaluated patient, he was sleeping.  Many family members were at bedside.  Answered their questions regarding his TIPS procedure this morning.  We discussed that while the TIPS was revised in portal flow was increased, it is not certain that this will have a positive impact on his overall clinical status.  Family is aware.  However they continue to be hopeful for some resumption of his mental status.  Plan to continue to administer lactulose today and recheck ammonia tomorrow.  Review of Systems  Unable to perform ROS: Mental acuity     Physical Exam Vitals and nursing note reviewed.  Constitutional:      Appearance: He is ill-appearing.      Comments: Frail, cachectic  Pulmonary:     Effort: Pulmonary effort is normal.     Comments: cough Abdominal:     General: There is distension.  Skin:    Coloration: Skin is jaundiced.  Neurological:     Comments: somnolent             Vital Signs: BP (!) 114/49 (BP Location: Right Arm)   Pulse 75   Temp 98 F (36.7 C) (Oral)   Resp 14   Ht 5' 9"  (1.753 m)   Wt 73.3 kg   SpO2 94%   BMI 23.86 kg/m  SpO2: SpO2: 94 % O2 Device: O2  Device: Room Air O2 Flow Rate: O2 Flow Rate (L/min): 2 L/min  Intake/output summary:  Intake/Output Summary (Last 24 hours) at 05/02/2022 1600 Last data filed at 05/02/2022 1200 Gross per 24 hour  Intake 200 ml  Output 2650 ml  Net -2450 ml   LBM: Last BM Date : 05/01/22 Baseline Weight: Weight: 71.2 kg Most recent weight: Weight: 73.3 kg       Palliative Assessment/Data: PPS: 20%      Patient Active Problem List   Diagnosis Date Noted   Goals of care, counseling/discussion 04/30/2022   DNR (do not resuscitate) 04/30/2022   Lactic acidosis 05/07/2022   Generalized weakness 05/18/2022   Decompensated hepatic cirrhosis (HCC)    GERD (gastroesophageal reflux disease)    Ascites of liver 01/01/2022   Anemia, unspecified 01/01/2022   Hyperglycemia 01/01/2022   Hepatic encephalopathy ([place]) 12/31/2021   Pancreatitis, acute 06/01/2021   AKI (acute kidney injury) ([place]) 05/27/2021   Near syncope 05/27/2021   Steatohepatitis, non-alcoholic    Hyperlipidemia    Overweight (BMI 25.0-29.9)    Alcoholic cirrhosis of liver with ascites ([place])    Acute hepatic encephalopathy ([place]) 05/22/2021   Thrombocytopenia ([place]) 05/04/2017    Palliative Care Assessment & Plan    Assessment/Recommendations/Plan  Continue current plan of care, full scope treatment limit has been said DNR PMT will continue to follow   Code Status: DNR  Prognosis:  Unable to determine -poor prognosis for full functional recovery, if patient was transition to  comfort care, anticipate his prognosis would be days to weeks  Discharge Planning: To Be Determined  Care plan was discussed with patient family and care team.   Thank you for allowing the Palliative Medicine Team to assist in the care of this patient.       Greater than 50%  of this time was spent counseling and coordinating care related to the above assessment and plan.  Mariana Kaufman, AGNP-C Palliative Medicine   Please contact Palliative Medicine Team phone at [phone] for questions and concerns.

## 2022-05-03 DIAGNOSIS — D649 Anemia, unspecified: Secondary | ICD-10-CM | POA: Diagnosis not present

## 2022-05-03 DIAGNOSIS — Z515 Encounter for palliative care: Secondary | ICD-10-CM | POA: Diagnosis not present

## 2022-05-03 DIAGNOSIS — K7682 Hepatic encephalopathy: Secondary | ICD-10-CM | POA: Diagnosis not present

## 2022-05-03 DIAGNOSIS — Z7189 Other specified counseling: Secondary | ICD-10-CM | POA: Diagnosis not present

## 2022-05-03 DIAGNOSIS — K7031 Alcoholic cirrhosis of liver with ascites: Secondary | ICD-10-CM | POA: Diagnosis not present

## 2022-05-03 DIAGNOSIS — K7290 Hepatic failure, unspecified without coma: Secondary | ICD-10-CM | POA: Diagnosis not present

## 2022-05-03 LAB — AMMONIA: Ammonia: 92 umol/L — ABNORMAL HIGH (ref 9–35)

## 2022-05-03 NOTE — Progress Notes (Signed)
PROGRESS NOTE  Peter Nichols QJJ:[id number] DOB: [birth year]   PCP: [name], MD  Patient is from: Home.  DOA: 05/03/2022 LOS: 93  Chief complaints Chief Complaint  Patient presents with   Weakness     Brief Narrative / Interim history:  76 year old M with PMH of neuromuscular disorder, alcoholic cirrhosis with recurrent ascites, hepatic encephalopathy, hepatic hydrothorax, anemia and thrombocytopenia presented with weakness and admitted for decompensated alcoholic hepatic cirrhosis with cystitis, hepatic hydrothorax, acute hepatic encephalopathy and physical deconditioning.  He has lactic acidosis to 4.3.  PCCM consulted on 7/2.  He underwent paracentesis with removal of 3 L on 7/2. Right thoracocentesis with removal of 1 L on 7/6.  [facility] consulted on 7/3.  He had TIPS placed on 7/7 by IR.  Hospital course complicated by ileus requiring NG tube.   Patient with gradual decline, worsening hepatic encephalopathy, coagulopathy and failure to thrive picture.  Poor prognosis.  CODE STATUS changed to DNR/DNI after discussion with patient's wife and daughter at bedside.  Palliative medicine following.  7/15-TIPS revision with constrainment and therapeutic paracentesis with removal of 1.95 L.    Subjective:  Seen and examined today, patient reports he is feeling much better today, we had good appetite asking to be started on a diet this morning.   Objective: Vitals:   05/02/22 1943 05/02/22 2334 05/03/22 0436 05/03/22 0831  BP: (!) 113/53 (!) 123/59 (!) 136/43 (!) 133/44  Pulse: 86 86 86 85  Resp: 16 19 17 20   Temp: 97.6 F (36.4 C) 97.6 F (36.4 C) 97.8 F (36.6 C) 97.7 F (36.5 C)  TempSrc: Oral Oral Oral Oral  SpO2: 99% 93% 94% 96%  Weight:   (P) 74.2 kg   Height:        Examination:  Awake Alert, Oriented X 3, appropriate, conversant in good spirits, extremely frail, deconditioned, chronically ill-appearing  Symmetrical Chest wall movement, Good air movement  bilaterally, CTAB RRR,No Gallops,Rubs or new Murmurs, No Parasternal Heave +ve B.Sounds, Abd Soft, No tenderness, No rebound - guarding or rigidity. No Cyanosis, Clubbing or edema, No new Rash or bruise     Procedures:  7/2-paracentesis with removal of 3 L 7/6-right thoracocentesis with removal of 1 L 7/7-TIPS placement by IR 7/15-TIPS revision with constrainment, by placement of tandem 41m VBX and 962mVIABAHN GORE stents. 7/15-paracentesis with removal of 1.95 L.  Microbiology summarized: 7/2-blood cultures NGTD. 7/2-peritoneal fluid culture NGTD.  Assessment and plan: Principal Problem:   Decompensated hepatic cirrhosis (HCC) Active Problems:   Thrombocytopenia (HCC)   Steatohepatitis, non-alcoholic   Alcoholic cirrhosis of liver with ascites (HCC)   Anemia, unspecified   Lactic acidosis   GERD (gastroesophageal reflux disease)   Generalized weakness   Goals of care, counseling/discussion   DNR (do not resuscitate)  Decompensated alcoholic hepatic cirrhosis with recurrent ascites and hepatic hydrothorax Elevated liver enzymes/hyperbilirubinemia: Bilirubin rising. Coagulopathy: INR 2.3. Acute hepatic encephalopathy and ileus after TIPS -S/p paracentesis with removal of 3 L on 7/2 and 1.95 L on 7/15. -S/p TIPS by IR on 7/7. -S/p TIPS revision with constrainment on 7/15, given severe hepatic encephalopathy, this morning he is much more awake, appropriate -[facility] due to age. -Inderal, Lasix and Aldactone held due to soft BP. -Lactulose enema 4 times daily -Changed Protonix to IV -Avoid sedating medications -GI signed off  Hepatic encephalopathy -Has worsened after the procedure, this morning much improved after TIPS revision with constrainment. -Monitor ammonia level closely. -  continue with lactulose and rifaximin  Normocytic anemia/thrombocytopenia: Likely due to liver cirrhosis.  Stable. -Continue monitoring  Cough:  CXR with recurrent right pleural effusion likely from hydrothorax.  He is also at risk for aspiration. -Supportive care   Lactic acidosis: Likely due to perfusion and decompensated liver cirrhosis.    Hyperkalemia/hyponatremia: Resolved.  Goal of care counseling/physical deconditioning: DNR/DNI.  Grim prognosis -Palliative medicine following  Body mass index is 24.16 kg/m (pended).         DVT prophylaxis:  Place and maintain sequential compression device Start: 05/03/22 1116 SCDs Start: 05/05/2022 2044  Code Status: Full code Family Communication: Updated multiple family members at bedside Level of care: Telemetry Medical Status is: Inpatient Remains inpatient appropriate because: Decompensated liver cirrhosis, hepatic encephalopathy and physical deconditioning   Final disposition: TBD Consultants:  Gastroenterology-signed off Pulmonology-signed off Interventional radiology Palliative medicine  Sch Meds:  Scheduled Meds:  lactulose  30 g Oral QID   Or   lactulose  300 mL Rectal QID   pantoprazole (PROTONIX) IV  40 mg Intravenous Q12H   rifaximin  550 mg Oral BID   Continuous Infusions:  sodium chloride Stopped (05/10/2022 1507)   PRN Meds:.acetaminophen **OR** acetaminophen, albuterol, guaiFENesin, [DISCONTINUED] ondansetron **OR** ondansetron (ZOFRAN) IV, mouth rinse  Antimicrobials: Anti-infectives (From admission, onward)    Start     Dose/Rate Route Frequency Ordered Stop   05/02/22 0943  ceFAZolin (ANCEF) IVPB 2g/100 mL premix        over 30 Minutes Intravenous Continuous PRN 05/02/22 0944 05/02/22 0943   04/28/22 1000  rifaximin (XIFAXAN) tablet 550 mg        550 mg Oral 2 times daily 04/28/22 0741     04/19/22 1000  rifaximin (XIFAXAN) tablet 550 mg  Status:  Discontinued        550 mg Oral 2 times daily 04/26/2022 2043 04/26/22 1750   05/08/2022 1715  cefTRIAXone (ROCEPHIN) 2 g in sodium chloride 0.9 % 100 mL IVPB  Status:  Discontinued        2 g 200 mL/hr  over 30 Minutes Intravenous Every 24 hours 04/20/2022 1712 04/25/22 1514        I have personally reviewed the following labs and images: CBC: Recent Labs  Lab 04/27/22 0119 04/28/22 0255 04/29/22 0652 04/30/22 0439 05/01/22 0544 05/02/22 0124  WBC 6.2 6.4 7.2 8.0 11.0* 8.1  NEUTROABS 4.4 4.4 4.9  --   --   --   HGB 10.0* 10.2* 10.5* 10.3* 9.9* 10.2*  HCT 28.4* 29.1* 30.0* 30.1* 28.4* 29.8*  MCV 101.8* 100.3* 99.7 101.0* 100.0 101.4*  PLT 70* 74* 79* 80* 72* 76*   BMP &GFR Recent Labs  Lab 04/27/22 0119 04/30/22 0439 05/01/22 0544 05/02/22 0124  NA 135 138 137 136  K 4.5 4.3 4.0 4.4  CL 106 108 107 110  CO2 20* 22 21* 19*  GLUCOSE 125* 123* 125* 118*  BUN 15 11 13 13   CREATININE 0.99 0.91 0.92 0.72  CALCIUM 9.0 9.2 9.1 9.1  MG  --  1.9  --  2.0  PHOS  --  3.1  --   --    Estimated Creatinine Clearance: 79.8 mL/min (by C-G formula based on SCr of 0.72 mg/dL). Liver & Pancreas: Recent Labs  Lab 04/27/22 0119 04/30/22 0439 05/01/22 0544 05/02/22 0124  AST 55* 59* 59* 64*  ALT 45* 45* 46* 45*  ALKPHOS 104 132* 127* 130*  BILITOT 7.7* 7.7* 8.8* 9.8*  PROT 4.3* 4.6* 4.2* 4.5*  ALBUMIN 2.8* 2.7* 2.7* 2.8*   No results for input(s): "LIPASE", "AMYLASE" in the last 168 hours. Recent Labs  Lab 04/27/22 0119 04/30/22 0439 05/01/22 0544 05/02/22 0124 05/03/22 0041  AMMONIA 95* 126* 119* 124* 92*   Diabetic: No results for input(s): "HGBA1C" in the last 72 hours. No results for input(s): "GLUCAP" in the last 168 hours.  Cardiac Enzymes: No results for input(s): "CKTOTAL", "CKMB", "CKMBINDEX", "TROPONINI" in the last 168 hours. No results for input(s): "PROBNP" in the last 8760 hours. Coagulation Profile: Recent Labs  Lab 04/30/22 0439 05/01/22 0544 05/02/22 0124  INR 2.3* 2.4* 2.3*   Thyroid Function Tests: No results for input(s): "TSH", "T4TOTAL", "FREET4", "T3FREE", "THYROIDAB" in the last 72 hours. Lipid Profile: No results for input(s):  "CHOL", "HDL", "LDLCALC", "TRIG", "CHOLHDL", "LDLDIRECT" in the last 72 hours. Anemia Panel: No results for input(s): "VITAMINB12", "FOLATE", "FERRITIN", "TIBC", "IRON", "RETICCTPCT" in the last 72 hours. Urine analysis:    Component Value Date/Time   COLORURINE AMBER (A) 04/19/2022 0405   APPEARANCEUR CLEAR 04/19/2022 0405   LABSPEC 1.019 04/19/2022 0405   PHURINE 5.0 04/19/2022 0405   GLUCOSEU NEGATIVE 04/19/2022 0405   HGBUR NEGATIVE 04/19/2022 0405   BILIRUBINUR NEGATIVE 04/19/2022 0405   KETONESUR NEGATIVE 04/19/2022 0405   PROTEINUR NEGATIVE 04/19/2022 0405   UROBILINOGEN 1.0 11/27/2007 1029   NITRITE NEGATIVE 04/19/2022 0405   LEUKOCYTESUR NEGATIVE 04/19/2022 0405   Sepsis Labs: Invalid input(s): "PROCALCITONIN", "LACTICIDVEN"  Microbiology: No results found for this or any previous visit (from the past 240 hour(s)).  Radiology Studies: No results found.    Phillips Climes MD [facility]  If 7PM-7AM, please contact night-coverage [email or web address] 05/03/2022, 11:19 AM

## 2022-05-03 NOTE — Progress Notes (Signed)
Palliative Medicine Inpatient Follow Up Note  HPI: 76 y.o. male  with past medical history of neuromuscular disorder, alcoholic cirrhosis with recurrent ascites, hepatic encephalopathy, hepatic hydrothorax, anemia and thrombocytopenia  admitted on 05/03/2022 with decompensated alcoholic hepatic cirrhosis with cystitis, hepatic hydrothorax, acute hepatic encephalopathy and physical deconditioning. He underwent paracentesis with removal of 3 L on 7/2. Right thoracocentesis with removal of 1 L on 7/6.He had TIPS placed on 7/7 by IR.  Hospital course complicated by ileus and ongoing hepatic encephalopathy.  PMT consulted to discuss goals of care.  Today's Discussion 05/03/2022  *Please note that this is a verbal dictation therefore any spelling or grammatical errors are due to the "[place] [employer]" system interpretation.  Chart reviewed inclusive of vital signs, progress notes, laboratory results, and diagnostic images.   Tailor was resting this morning when I went to visit.  Per his family he had just started to relax.  I met with patient's 2 daughters this morning.  They share that [name] had a bit of a turnaround last night whereby he was more alert and coherent.  He was also asking for food and to feed himself.  They feel that post procedurally he is doing much better than they had anticipated.Created space and opportunity for patient to explore thoughts feelings and fears regarding current medical situation.  Acknowledged the difficulty associated with Duchene cirrhosis and his overall disease process.  Discussed how difficult of the disease it is over time.  Some of the objectives of Solon's daughters are for him to work with physical therapy and Occupational Therapy.  They also have some questions regarding his nutritional intake.  I shared that we would asked the dietitian to come by to provide additional education.  For the time being the plan will be allowance of time for outcomes.   Patient's family is very aware of the tenuous nature of Meade's disease burden.  Questions and concerns addressed   Palliative Support Provided  Objective Assessment: Vital Signs Vitals:   05/03/22 0436 05/03/22 0831  BP: (!) 136/43 (!) 133/44  Pulse: 86 85  Resp: 17 20  Temp: 97.8 F (36.6 C) 97.7 F (36.5 C)  SpO2: 94% 96%    Intake/Output Summary (Last 24 hours) at 05/03/2022 1010 Last data filed at 05/03/2022 0443 Gross per 24 hour  Intake 100 ml  Output 3150 ml  Net -3050 ml   Last Weight  Most recent update: 05/03/2022  5:01 AM    Weight  (P)  74.2 kg (163 lb 9.3 oz)            Gen: Frail elderly Caucasian male in no acute distress HEENT: Jaundiced dry mucous membranes CV: Regular rate and rhythm PULM: On room air ABD: Distended EXT: No edema Neuro: Sleeping  SUMMARY OF RECOMMENDATIONS   DNAR/DNI  Continue current scope of treatment allowing time for outcomes --> patient's family is delighted by his overall improvement since procedure yesterday  PT/OT for weakness and muscular deconditioning  Nutrition evaluation for needs moving forward  We will discuss outpatient palliative support if patient continues to improve  The palliative care team will continue to incrementally follow along with Gwyndolyn Saxon during hospitalization  Billing based on MDM: High  Problems Addressed: One acute or chronic illness or injury that poses a threat to life or bodily function  Amount and/or Complexity of Data: Category 3:Discussion of management or test interpretation with external physician/other qualified health care professional/appropriate source (not separately reported)  Risks: Decision regarding hospitalization  or escalation of hospital care ______________________________________________________________________________________ [place] Team Team Cell Phone: [phone] Please utilize secure chat with additional questions,  if there is no response within 30 minutes please call the above phone number  Palliative Medicine Team providers are available by phone from 7am to 7pm daily and can be reached through the team cell phone.  Should this patient require assistance outside of these hours, please call the patient's attending physician.

## 2022-05-03 NOTE — Progress Notes (Signed)
Supervising Physician: Michaelle Birks  Patient Status:  Peter Nichols - In-pt  Chief Complaint: S/p paracentesis, TIPS creation, and ectopic variceal coil embolization on 7/7 performed by Dr. Serafina Royals. Patient underwent portal venogram with TIPS revision and paracentesis 05/02/22 with Dr. Maryelizabeth Kaufmann   Subjective: Patient asleep in bed. Per family, he "woke up" last night and was alert, oriented, very conversational and hungry. He ate quite a lot of food last night.   Allergies: Ensure  Medications: Prior to Admission medications   Medication Sig Start Date End Date Taking? Authorizing Provider  b complex vitamins capsule Take 1 capsule by mouth daily.   Yes [provider]  calcium carbonate (TUMS - DOSED IN MG ELEMENTAL CALCIUM) 500 MG chewable tablet Chew 1 tablet by mouth daily as needed for indigestion or heartburn.   Yes [provider]  fluticasone (FLONASE) 50 MCG/ACT nasal spray Place 1-2 sprays into both nostrils daily as needed for allergies or rhinitis.   Yes [provider]  furosemide (LASIX) 40 MG tablet Take 40 mg by mouth daily. 01/09/22  Yes [provider]  lactulose (CHRONULAC) 10 GM/15ML solution Take 45 mLs (30 g total) by mouth 3 (three) times daily. Patient taking differently: Take 30 g by mouth 2 (two) times daily. 01/02/22  Yes Allie Bossier, MD  Multiple Vitamins-Minerals (MULTIVITAMIN MEN 50+) TABS Take 1 tablet by mouth daily with breakfast.   Yes [provider]  pantoprazole (PROTONIX) 40 MG tablet Take 40 mg by mouth daily before breakfast.   Yes [provider]  pravastatin (PRAVACHOL) 40 MG tablet Take 40 mg by mouth daily.   Yes [provider]  Protein POWD Take 1 Scoop by mouth See admin instructions. Unnamed WHEY-FREE protein powder: Mix 1 scoopful of powder into the appropriate amount of liquid and drink once a day as needed for nutritional support   Yes [provider]  rifaximin (XIFAXAN) 550  MG TABS tablet Take 1 tablet (550 mg total) by mouth 2 (two) times daily. 01/02/22  Yes Allie Bossier, MD  spironolactone (ALDACTONE) 50 MG tablet Take 150 mg by mouth daily. 01/09/22  Yes [provider]  feeding supplement (ENSURE ENLIVE / ENSURE PLUS) LIQD Take 237 mLs by mouth daily. Patient not taking: Reported on 05/01/2022 01/02/22   Allie Bossier, MD  Multiple Vitamin (MULTIVITAMIN WITH MINERALS) TABS tablet Take 1 tablet by mouth daily. Patient not taking: Reported on 05/09/2022 05/25/21   Annita Brod, MD  Nutritional Supplements (,FEEDING SUPPLEMENT, PROSOURCE PLUS) liquid Take 30 mLs by mouth 2 (two) times daily between meals. Patient not taking: Reported on 05/03/2022 01/02/22   Allie Bossier, MD  pravastatin (PRAVACHOL) 20 MG tablet Take 3 tablets (60 mg total) by mouth daily. Patient not taking: Reported on 04/21/2022 01/03/22   Allie Bossier, MD  propranolol (INDERAL) 10 MG tablet Take 1 tablet (10 mg total) by mouth 2 (two) times daily. Patient not taking: Reported on 05/06/2022 06/03/21   Elgergawy, Silver Huguenin, MD     Vital Signs: BP (!) 136/43 (BP Location: Right Leg)   Pulse 86   Temp 97.8 F (36.6 C) (Oral)   Resp 17   Ht 5' 9"  (1.753 m)   Wt (P) 163 lb 9.3 oz (74.2 kg)   SpO2 94%   BMI (P) 24.16 kg/m   Physical Exam Constitutional:      General: He is not in acute distress.    Comments: Patient asleep and appears  to be resting comfortably  Cardiovascular:     Comments: Right IJ vascular site is clean, dry and intact.  Pulmonary:     Effort: Pulmonary effort is normal.  Abdominal:     Comments: Family states the patient had a large bowel movement last night.   Skin:    Coloration: Skin is jaundiced.     Imaging: [place] ASCITES (ABDOMEN LIMITED)  Result Date: 05/01/2022 CLINICAL DATA:  Ascites EXAM: LIMITED ABDOMEN ULTRASOUND FOR ASCITES TECHNIQUE: Limited ultrasound survey for ascites was performed in all four abdominal quadrants. COMPARISON:  None  available FINDINGS: Mild ascites noted in the paracolic gutters. Small bilateral pleural effusions also noted. IMPRESSION: Mild abdominal ascites. Electronically Signed   By: Miachel Roux M.D.   On: 05/01/2022 12:01   DG Chest Port 1 View  Result Date: 05/01/2022 CLINICAL DATA:  Cough. EXAM: PORTABLE CHEST 1 VIEW COMPARISON:  Chest x-ray dated April 23, 2022. FINDINGS: The heart size and mediastinal contours are within normal limits. Normal pulmonary vascularity. Increasing hazy density overlying the right mid and lower lung consistent with recurrent pleural effusion. No pneumothorax. No acute osseous abnormality. IMPRESSION: 1. Recurrent right pleural effusion. Electronically Signed   By: Titus Dubin M.D.   On: 05/01/2022 09:39   DG Abd Portable 1V  Result Date: 04/29/2022 CLINICAL DATA:  Abdominal distention. EXAM: PORTABLE ABDOMEN - 1 VIEW COMPARISON:  KUB 04/27/2022; CT abdomen and pelvis 04/20/2022 FINDINGS: Moderate fluid material within the stomach. Air and stool are seen likely within the descending colon. Mild interval decrease in air-filled distention of the small bowel. Small bowel loops again measure up to approximately 3.5 cm in caliber. A likely TIPS metallic stent again overlies the right upper abdominal quadrant. Cholecystectomy clips. Left lateral vascular coils. Chronic mid left ilium bone loss is again seen. IMPRESSION: 1. Mild interval decrease in air-filled loops of small bowel, likely mildly improved ileus. 2. Interval removal of nasogastric tube. Electronically Signed   By: Yvonne Kendall M.D.   On: 04/29/2022 15:50    Labs:  CBC: Recent Labs    04/29/22 0652 04/30/22 0439 05/01/22 0544 05/02/22 0124  WBC 7.2 8.0 11.0* 8.1  HGB 10.5* 10.3* 9.9* 10.2*  HCT 30.0* 30.1* 28.4* 29.8*  PLT 79* 80* 72* 76*    COAGS: Recent Labs    05/27/21 1634 11/07/21 1241 12/10/21 1245 12/23/21 1208 03/17/22 1147 05/16/2022 1755 04/25/22 0119 04/30/22 0439 05/01/22 0544  05/02/22 0124  INR 1.5*   < > 1.6* 1.7* 1.7*   < > 2.2* 2.3* 2.4* 2.3*  APTT 31  --  35 34 33  --   --   --   --   --    < > = values in this interval not displayed.    BMP: Recent Labs    04/27/22 0119 04/30/22 0439 05/01/22 0544 05/02/22 0124  NA 135 138 137 136  K 4.5 4.3 4.0 4.4  CL 106 108 107 110  CO2 20* 22 21* 19*  GLUCOSE 125* 123* 125* 118*  BUN 15 11 13 13   CALCIUM 9.0 9.2 9.1 9.1  CREATININE 0.99 0.91 0.92 0.72  GFRNONAA >60 >60 >60 >60    LIVER FUNCTION TESTS: Recent Labs    04/27/22 0119 04/30/22 0439 05/01/22 0544 05/02/22 0124  BILITOT 7.7* 7.7* 8.8* 9.8*  AST 55* 59* 59* 64*  ALT 45* 45* 46* 45*  ALKPHOS 104 132* 127* 130*  PROT 4.3* 4.6* 4.2* 4.5*  ALBUMIN 2.8* 2.7* 2.7* 2.8*  Assessment and Plan:  S/p paracentesis, TIPS creation, and ectopic variceal coil embolization on 7/7 performed by Dr. Serafina Royals. Patient underwent portal venogram with TIPS revision and paracentesis 05/02/22 with Dr. Maryelizabeth Kaufmann   Ammonia has decreased from 124 to 92 overnight. Patient currently asleep but family states he was awake, alert, oriented and conversational last night. Right IJ site is clean, dry and intact.   IR recommends to continue lactulose and to continue monitoring labs including ammonia levels. Other plans per primary teams.   IR will continue to follow.   Electronically Signed: Soyla Dryer, AGACNP-BC [phone] 05/03/2022, 8:39 AM   I spent a total of 15 Minutes at the the patient's bedside AND on the patient's Nichols floor or unit, greater than 50% of which was counseling/coordinating care s/p TIPS revision

## 2022-05-04 ENCOUNTER — Other Ambulatory Visit (HOSPITAL_COMMUNITY): Payer: Medicare HMO

## 2022-05-04 DIAGNOSIS — K7682 Hepatic encephalopathy: Secondary | ICD-10-CM | POA: Diagnosis not present

## 2022-05-04 DIAGNOSIS — K7290 Hepatic failure, unspecified without coma: Secondary | ICD-10-CM | POA: Diagnosis not present

## 2022-05-04 DIAGNOSIS — D696 Thrombocytopenia, unspecified: Secondary | ICD-10-CM | POA: Diagnosis not present

## 2022-05-04 DIAGNOSIS — K7031 Alcoholic cirrhosis of liver with ascites: Secondary | ICD-10-CM | POA: Diagnosis not present

## 2022-05-04 LAB — BASIC METABOLIC PANEL
Anion gap: 4 — ABNORMAL LOW (ref 5–15)
BUN: 15 mg/dL (ref 8–23)
CO2: 22 mmol/L (ref 22–32)
Calcium: 8.9 mg/dL (ref 8.9–10.3)
Chloride: 110 mmol/L (ref 98–111)
Creatinine, Ser: 0.94 mg/dL (ref 0.61–1.24)
GFR, Estimated: >60 mL/min (ref >60)
Glucose, Bld: 126 mg/dL — ABNORMAL HIGH (ref 70–99)
Potassium: 3.6 mmol/L (ref 3.5–5.1)
Sodium: 136 mmol/L (ref 135–145)

## 2022-05-04 LAB — CBC
HCT: 27.2 % — ABNORMAL LOW (ref 39.0–52.0)
Hemoglobin: 9.3 g/dL — ABNORMAL LOW (ref 13.0–17.0)
MCH: 34.6 pg — ABNORMAL HIGH (ref 26.0–34.0)
MCHC: 34.2 g/dL (ref 30.0–36.0)
MCV: 101.1 fL — ABNORMAL HIGH (ref 80.0–100.0)
Platelets: 87 10*3/uL — ABNORMAL LOW (ref 150–400)
RBC: 2.69 MIL/uL — ABNORMAL LOW (ref 4.22–5.81)
RDW: 16.5 % — ABNORMAL HIGH (ref 11.5–15.5)
WBC: 6.9 10*3/uL (ref 4.0–10.5)
nRBC: 0 % (ref 0.0–0.2)

## 2022-05-04 LAB — AMMONIA: Ammonia: 100 umol/L — ABNORMAL HIGH (ref 9–35)

## 2022-05-04 MED ORDER — POTASSIUM CHLORIDE CRYS ER 20 MEQ PO TBCR
40.0000 meq | EXTENDED_RELEASE_TABLET | Freq: Once | ORAL | Status: AC
Start: 2022-05-04 — End: 2022-05-04
  Administered 2022-05-04: 40 meq via ORAL
  Filled 2022-05-04: qty 2

## 2022-05-04 MED ORDER — TAMSULOSIN HCL 0.4 MG PO CAPS
0.4000 mg | ORAL_CAPSULE | Freq: Every day | ORAL | Status: DC
Start: 2022-05-04 — End: 2022-05-05
  Administered 2022-05-04: 0.4 mg via ORAL
  Filled 2022-05-04: qty 1

## 2022-05-04 MED ORDER — MIDODRINE HCL 5 MG PO TABS
10.0000 mg | ORAL_TABLET | Freq: Three times a day (TID) | ORAL | Status: DC
Start: 2022-05-04 — End: 2022-05-05
  Administered 2022-05-04 (×4): 10 mg via ORAL
  Filled 2022-05-04 (×5): qty 2

## 2022-05-04 MED ORDER — ADULT MULTIVITAMIN W/MINERALS CH
1.0000 | ORAL_TABLET | Freq: Every day | ORAL | Status: DC
  Administered 2022-05-04: 1 via ORAL
  Filled 2022-05-04 (×2): qty 1

## 2022-05-04 MED ORDER — LACTULOSE 10 GM/15ML PO SOLN
30.0000 g | ORAL | Status: AC
  Administered 2022-05-04 (×3): 30 g via ORAL
  Filled 2022-05-04 (×2): qty 45

## 2022-05-04 MED ORDER — B COMPLEX-C PO TABS
1.0000 | ORAL_TABLET | Freq: Every day | ORAL | Status: DC
  Administered 2022-05-04: 1 via ORAL
  Filled 2022-05-04 (×2): qty 1

## 2022-05-04 NOTE — Progress Notes (Signed)
Physical Therapy Treatment Patient Details Name: Peter Nichols MRN: [id number] DOB: 05-05-1946 Today's Date: 05/04/2022   History of Present Illness Peter Nichols is a 76 y.o. male with a past medical history significant for HTN, chronic thrombocytopenia, GERD, decompensated alcoholic cirrhosis, portal hypertension, hepatic encephalopathy, hepatic hydrothorax, recurrent ascites, large left retroperitoneal varix who presented to [facility] ED on 7/1 with complaints of weakness, confusion, persistent hypotension with diuretic use and required initiation of levophed. He underwent paracentesis with removal of 3 L on 7/2. Right thoracocentesis with removal of 1 L on 7/6.  [facility] consulted on 7/3.  He had TIPS placed on 7/7 by IR.  Hospital course complicated by ileus requiring NG tube. 7/15-TIPS revision with constrainment and therapeutic paracentesis with removal of 1.95 L.    Peter Nichols Comments    Peter Nichols making steady progress with functional mobility and was able to complete bed mobility and transfers with mod A x2. Wife and daughter were present throughout and very pleased to see Peter Nichols sitting up in the recliner chair at the end of the session. Peter Nichols would continue to benefit from skilled physical therapy services at this time while admitted and after d/c to address the below listed limitations in order to improve overall safety and independence with functional mobility.    Recommendations for follow up therapy are one component of a multi-disciplinary discharge planning process, led by the attending physician.  Recommendations may be updated based on patient status, additional functional criteria and insurance authorization.  Follow Up Recommendations  Skilled nursing-short term rehab (<3 hours/day) Can patient physically be transported by private vehicle: No   Assistance Recommended at Discharge Frequent or constant Supervision/Assistance  Patient can return home with the following Two people to help with walking  and/or transfers;Two people to help with bathing/dressing/bathroom;Assistance with cooking/housework;Assistance with feeding;Assist for transportation;Help with stairs or ramp for entrance   Equipment Recommendations  Other (comment) (defer to next venue of care)    Recommendations for Other Services       Precautions / Restrictions Precautions Precautions: Fall Restrictions Weight Bearing Restrictions: No     Mobility  Bed Mobility Overal bed mobility: Needs Assistance Bed Mobility: Supine to Sit     Supine to sit: Mod assist, +2 for physical assistance, HOB elevated     General bed mobility comments: greatly increased time and effort needed, use of bed rails, Peter Nichols able to initiate movement of bilateral LEs off of bed, assistance needed for trunk elevation to achieve an upright sitting position at EOB    Transfers Overall transfer level: Needs assistance Equipment used: 2 person hand held assist Transfers: Sit to/from Stand, Bed to chair/wheelchair/BSC Sit to Stand: From elevated surface, Mod assist, +2 physical assistance Stand pivot transfers: Mod assist, +2 physical assistance         General transfer comment: assistance needed to power into standing from an elevated bed position, assistance and max cueing to pivot from the bed to the chair towards Peter Nichols's R side    Ambulation/Gait               General Gait Details: unable   Stairs             Wheelchair Mobility    Modified Rankin (Stroke Patients Only)       Balance Overall balance assessment: Needs assistance Sitting-balance support: Feet supported Sitting balance-Leahy Scale: Fair     Standing balance support: During functional activity, Bilateral upper extremity supported Standing balance-Leahy Scale: Poor  Cognition Arousal/Alertness: Awake/alert Behavior During Therapy: Flat affect Overall Cognitive Status: Impaired/Different from  baseline Area of Impairment: Attention, Memory, Following commands, Safety/judgement, Awareness, Problem solving                   Current Attention Level: Sustained Memory: Decreased short-term memory Following Commands: Follows one step commands inconsistently, Follows one step commands with increased time Safety/Judgement: Decreased awareness of deficits Awareness: Intellectual Problem Solving: Slow processing, Decreased initiation, Difficulty sequencing, Requires verbal cues, Requires tactile cues          Exercises      General Comments        Pertinent Vitals/Pain Pain Assessment Pain Assessment: No/denies pain    Home Living                          Prior Function            Peter Nichols Goals (current goals can now be found in the care plan section) Acute Rehab Peter Nichols Goals Peter Nichols Goal Formulation: With patient/family Time For Goal Achievement: 05/06/22 Potential to Achieve Goals: Fair Progress towards Peter Nichols goals: Progressing toward goals    Frequency    Min 2X/week      Peter Nichols Plan Current plan remains appropriate    Co-evaluation Peter Nichols/OT/SLP Co-Evaluation/Treatment: Yes Reason for Co-Treatment: To address functional/ADL transfers;For patient/therapist safety Peter Nichols goals addressed during session: Mobility/safety with mobility;Balance;Strengthening/ROM        AM-PAC Peter Nichols "6 Clicks" Mobility   Outcome Measure  Help needed turning from your back to your side while in a flat bed without using bedrails?: A Lot Help needed moving from lying on your back to sitting on the side of a flat bed without using bedrails?: A Lot Help needed moving to and from a bed to a chair (including a wheelchair)?: A Lot Help needed standing up from a chair using your arms (e.g., wheelchair or bedside chair)?: A Lot Help needed to walk in hospital room?: A Lot Help needed climbing 3-5 steps with a railing? : Total 6 Click Score: 11    End of Session Equipment Utilized During  Treatment: Gait belt Activity Tolerance: Patient tolerated treatment well Patient left: in chair;with call bell/phone within reach;with chair alarm set;with family/visitor present Nurse Communication: Mobility status Peter Nichols Visit Diagnosis: Other abnormalities of gait and mobility (R26.89)     Time: 9518-8416 Peter Nichols Time Calculation (min) (ACUTE ONLY): 32 min  Charges:  $Therapeutic Activity: 8-22 mins                     Anastasio Champion, DPT  Acute Rehabilitation Services Office [place] 05/04/2022, 10:51 AM

## 2022-05-04 NOTE — Progress Notes (Signed)
Initial Nutrition Assessment  DOCUMENTATION CODES:   Severe malnutrition in context of chronic illness  INTERVENTION:   Medical illustrator once daily, each packet mixed with 8 ounces of 2% milk provides 13 grams of protein and 260 calories.  Magic cup BID with meals, each supplement provides 290 kcal and 9 grams of protein  MVI with minerals daily.  B-complex with vitamin C daily.  NUTRITION DIAGNOSIS:   Severe Malnutrition related to chronic illness (hepatic cirrhosis) as evidenced by severe muscle depletion, severe fat depletion.  GOAL:   Patient will meet greater than or equal to 90% of their needs  MONITOR:   PO intake, Supplement acceptance, Labs, Skin  REASON FOR ASSESSMENT:   Consult Assessment of nutrition requirement/status  ASSESSMENT:   76 yo male admitted with decompensated alcoholic hepatic cirrhosis. PMH includes neuromuscular disorder, alcoholic cirrhosis with recurrent ascites, hepatic encephalopathy, hepatic hydrothorax, anemia.  7/02 - Paracentesis, 3 L removed 7/06 - R thoracocentesis, 1 L removed 7/07 - TIPS 7/15 - TIPS revision; paracentesis, 1.95 L removed  Patient sleeping; spoke with wife at bedside. Patient has been eating poorly for several months. Over the past day or so, he has been eating much better and his appetite has been good. He ate everything on his breakfast tray except the oatmeal. He does not like Ensure, makes him vomit, or Boost Breeze, does not like the taste. She agreed to try [employer] mixed with milk on his breakfast tray in the morning to see if he likes it. He takes a multivitamin and b-complex vitamin at home and would like to receive them while in the hospital; RD to order. Wife wanted to know if he is still on a low sodium diet. Will provide education materials in discharge summary.   Labs reviewed. Ammonia 100, alk phos 130  Medications reviewed and include lactulose, Protonix,  Flomax.  Weight history reviewed. Weight is down by 18% over the past year. Likely r/t loss of LBM as well as fluids.   Patient meets criteria for severe malnutrition, given severe depletion of muscle and subcutaneous fat mass.  NUTRITION - FOCUSED PHYSICAL EXAM:  Flowsheet Row Most Recent Value  Orbital Region Severe depletion  Upper Arm Region Severe depletion  Thoracic and Lumbar Region Severe depletion  Buccal Region Severe depletion  Temple Region Severe depletion  Clavicle Bone Region Severe depletion  Clavicle and Acromion Bone Region Severe depletion  Scapular Bone Region Severe depletion  Dorsal Hand Severe depletion  Patellar Region Severe depletion  Anterior Thigh Region Severe depletion  Posterior Calf Region Severe depletion  Edema (RD Assessment) Mild  Hair Reviewed  Eyes Unable to assess  Mouth Unable to assess  Skin Reviewed  Nails Reviewed       Diet Order:   Diet Order             Diet 2 gram sodium Room service appropriate? Yes; Fluid consistency: Thin  Diet effective now                   EDUCATION NEEDS:   Education needs have been addressed  Skin:  Skin Assessment: Skin Integrity Issues: Skin Integrity Issues:: Stage II Stage II: R buttocks + MASD  Last BM:  7/13  Height:   Ht Readings from Last 1 Encounters:  05/15/2022 5' 9"  (1.753 m)    Weight:   Wt Readings from Last 1 Encounters:  05/03/22 (P) 74.2 kg    Ideal Body Weight:  72.7 kg  BMI:  Body mass index is 24.16 kg/m (pended).  Estimated Nutritional Needs:   Kcal:  2300-2500  Protein:  95-110 gm  Fluid:  2.1-2.3 L   Lucas Mallow RD, LDN, CNSC Please refer to Amion for contact information.

## 2022-05-04 NOTE — TOC Progression Note (Signed)
Transition of Care [facility]) - Progression Note    Patient Details  Name: Peter Nichols MRN: [id number] Date of Birth: 1945-11-05  [facility] of [facility]) CM/SW [place], LCSW Phone Number: 05/04/2022, 9:24 AM  Clinical Narrative:    CSW will continue to follow.    Expected Discharge Plan: [place] Barriers to Discharge: Continued Medical Work up  Expected Discharge Plan and Services Expected Discharge Plan: [place]   Discharge Planning Services: CM Consult   Living arrangements for the past 2 months: Single Family Home                                       Social Determinants of Health (SDOH) Interventions    Readmission Risk Interventions     No data to display

## 2022-05-04 NOTE — Plan of Care (Signed)

## 2022-05-04 NOTE — TOC Initial Note (Signed)
Transition of Care [facility]) - Initial/Assessment Note    Patient Details  Name: Peter Nichols MRN: [id number] Date of Birth: [birth year]  Transition of Care [facility]) CM/SW Contact:    Benard Halsted, LCSW Phone Number: 05/04/2022, 9:25 AM  Clinical Narrative:                 CSW continuing to follow. [facility] no longer able to accept patient so CSW will obtain new SNF choice from family and obtain new insurance approval.   Expected Discharge Plan: [place] Barriers to Discharge: Continued Medical Work up   Patient Goals and CMS Choice Patient states their goals for this hospitalization and ongoing recovery are:: [facility] at [facility]/2022 CMS Medicare.gov Compare Post Acute Care list provided to:: Patient Choice offered to / list presented to : Patient  Expected Discharge Plan and Services Expected Discharge Plan: [place]   Discharge Planning Services: CM Consult   Living arrangements for the past 2 months: Single Family Home                                      Prior Living Arrangements/Services Living arrangements for the past 2 months: Single Family Home Lives with:: Spouse Patient language and need for interpreter reviewed:: Yes Do you feel safe going back to the place where you live?: Yes      Need for Family Participation in Patient Care: Yes (Comment) Care giver support system in place?: Yes (comment)   Criminal Activity/Legal Involvement Pertinent to Current Situation/Hospitalization: No - Comment as needed  Activities of Daily Living      Permission Sought/Granted                  Emotional Assessment Appearance:: Appears stated age Attitude/Demeanor/Rapport: Unable to Assess   Orientation: : Oriented to Place, Oriented to Self Alcohol / Substance Use: Not Applicable Psych Involvement: No (comment)  Admission diagnosis:  Hepatic encephalopathy ([place]) [K76.82] Dehydration [E86.0] Lactic acidosis  [E87.20] Weakness [R53.1] Thrombocytopenia (H[facility]) [D69.6] Superficial vein thrombosis [V37.106] Alcoholic cirrhosis of liver with ascites ([place]) [K70.31] Decompensated hepatic cirrhosis ([place]) [K72.90, K74.60] Pressure injury of sacral region, stage 2 ([place]) [L89.152] Patient Active Problem List   Diagnosis Date Noted   Goals of care, counseling/discussion 04/30/2022   DNR (do not resuscitate) 04/30/2022   Lactic acidosis 05/05/2022   Generalized weakness 05/15/2022   Decompensated hepatic cirrhosis ([place])    GERD (gastroesophageal reflux disease)    Ascites of liver 01/01/2022   Anemia, unspecified 01/01/2022   Hyperglycemia 01/01/2022   Hepatic encephalopathy ([place]) 12/31/2021   Pancreatitis, acute 06/01/2021   AKI (acute kidney injury) ([place]) 05/27/2021   Near syncope 05/27/2021   Steatohepatitis, non-alcoholic    Hyperlipidemia    Overweight (BMI 25.0-29.9)    Alcoholic cirrhosis of liver with ascites ([place])    Acute hepatic encephalopathy ([place]) 05/22/2021   Thrombocytopenia ([place]) 05/04/2017   PCP:  [name], MD Pharmacy:   [id number] [place] [place] [place]: [phone]: [phone] [facility] [address]: [phone]: [phone]    Social Determinants of Health (SDOH) Interventions    Readmission Risk Interventions     No data to display

## 2022-05-04 NOTE — Evaluation (Signed)
Occupational Therapy Evaluation Patient Details Name: Peter Nichols MRN: [id number] DOB: [birth year] Today's Date: 05/04/2022   History of Present Illness Peter Nichols is a 76 y.o. male with a past medical history significant for HTN, chronic thrombocytopenia, GERD, decompensated alcoholic cirrhosis, portal hypertension, hepatic encephalopathy, hepatic hydrothorax, recurrent ascites, large left retroperitoneal varix who presented to [facility] ED on 7/1 with complaints of weakness, confusion, persistent hypotension with diuretic use and required initiation of levophed. He underwent paracentesis with removal of 3 L on 7/2. Right thoracocentesis with removal of 1 L on 7/6.  [facility] consulted on 7/3.  He had TIPS placed on 7/7 by IR.  Hospital course complicated by ileus requiring NG tube. 7/15-TIPS revision with constrainment and therapeutic paracentesis with removal of 1.95 L.   Clinical Impression   Pt was independent at his baseline. Presents with generalized weakness, impaired cognition and decreased standing balance. He requires +2 mod assist for all mobility and min to total assist for ADLs. Pt will need post acute OT in SNF prior to return home with his supportive family.      Recommendations for follow up therapy are one component of a multi-disciplinary discharge planning process, led by the attending physician.  Recommendations may be updated based on patient status, additional functional criteria and insurance authorization.   Follow Up Recommendations  Skilled nursing-short term rehab (<3 hours/day)    Assistance Recommended at Discharge Frequent or constant Supervision/Assistance  Patient can return home with the following Two people to help with walking and/or transfers;Two people to help with bathing/dressing/bathroom;Assistance with cooking/housework;Direct supervision/assist for medications management;Direct supervision/assist for financial management;Assistance with feeding;Assist for  transportation;Help with stairs or ramp for entrance    Functional Status Assessment  Patient has had a recent decline in their functional status and/or demonstrates limited ability to make significant improvements in function in a reasonable and predictable amount of time  Equipment Recommendations   (defer to next venue)    Recommendations for Other Services       Precautions / Restrictions Precautions Precautions: Fall Restrictions Weight Bearing Restrictions: No      Mobility Bed Mobility Overal bed mobility: Needs Assistance Bed Mobility: Supine to Sit     Supine to sit: Mod assist, +2 for physical assistance, HOB elevated     General bed mobility comments: greatly increased time and effort needed, use of bed rails, pt able to initiate movement of bilateral LEs off of bed, assistance needed for trunk elevation to achieve an upright sitting position at EOB    Transfers Overall transfer level: Needs assistance Equipment used: 2 person hand held assist Transfers: Sit to/from Stand, Bed to chair/wheelchair/BSC Sit to Stand: From elevated surface, Mod assist, +2 physical assistance Stand pivot transfers: Mod assist, +2 physical assistance         General transfer comment: assistance needed to power into standing from an elevated bed position, assistance and max cueing to pivot from the bed to the chair towards pt's R side      Balance Overall balance assessment: Needs assistance Sitting-balance support: Feet supported Sitting balance-Leahy Scale: Fair     Standing balance support: During functional activity, Bilateral upper extremity supported Standing balance-Leahy Scale: Poor                             ADL either performed or assessed with clinical judgement   ADL Overall ADL's : Needs assistance/impaired Eating/Feeding: Maximal assistance;Sitting Eating/Feeding Details (indicate  cue type and reason): can bring cup with straw to mouth Grooming:  Oral care;Wash/dry face;Sitting;Minimal assistance   Upper Body Bathing: Total assistance   Lower Body Bathing: +2 for physical assistance;Total assistance;Sit to/from stand   Upper Body Dressing : Maximal assistance;Sitting   Lower Body Dressing: +2 for physical assistance;Total assistance;Sit to/from stand   Toilet Transfer: +2 for physical assistance;Stand-pivot;Moderate assistance Toilet Transfer Details (indicate cue type and reason): simulated to chair Toileting- Clothing Manipulation and Hygiene: +2 for physical assistance;Total assistance;Sit to/from stand               Vision Baseline Vision/History: 1 Wears glasses Ability to See in Adequate Light: 0 Adequate Patient Visual Report: No change from baseline       Perception     Praxis      Pertinent Vitals/Pain Pain Assessment Pain Assessment: Faces Faces Pain Scale: No hurt     Hand Dominance Right   Extremity/Trunk Assessment Upper Extremity Assessment Upper Extremity Assessment: Generalized weakness   Lower Extremity Assessment Lower Extremity Assessment: Defer to PT evaluation   Cervical / Trunk Assessment Cervical / Trunk Assessment: Kyphotic   Communication Communication Communication: No difficulties   Cognition Arousal/Alertness: Awake/alert Behavior During Therapy: Flat affect Overall Cognitive Status: Impaired/Different from baseline Area of Impairment: Attention, Memory, Following commands, Safety/judgement, Awareness, Problem solving                   Current Attention Level: Sustained Memory: Decreased short-term memory Following Commands: Follows one step commands inconsistently, Follows one step commands with increased time Safety/Judgement: Decreased awareness of deficits Awareness: Intellectual Problem Solving: Slow processing, Decreased initiation, Difficulty sequencing, Requires verbal cues, Requires tactile cues       General Comments       Exercises      Shoulder Instructions      Home Living Family/patient expects to be discharged to:: Private residence Living Arrangements: Spouse/significant other Available Help at Discharge: Family;Available 24 hours/day Type of Home: House Home Access: Stairs to enter [employer] of Steps: 1   Home Layout: Two level;1/2 bath on main level;Bed/bath upstairs Alternate Level Stairs-Number of Steps: 14 Alternate Level Stairs-Rails: Left Bathroom Shower/Tub: Occupational psychologist: Handicapped height     Home Equipment: None          Prior Functioning/Environment Prior Level of Function : Independent/Modified Independent                        OT Problem List: Decreased strength;Impaired balance (sitting and/or standing);Decreased cognition;Decreased knowledge of use of DME or AE;Impaired UE functional use      OT Treatment/Interventions: Self-care/ADL training;DME and/or AE instruction;Therapeutic activities;Cognitive remediation/compensation;Patient/family education;Balance training    OT Goals(Current goals can be found in the care plan section) Acute Rehab OT Goals OT Goal Formulation: With patient/family Time For Goal Achievement: 05/18/22 Potential to Achieve Goals: Good ADL Goals Pt Will Perform Eating: with set-up;sitting Pt Will Perform Grooming: with set-up;sitting Pt Will Perform Upper Body Bathing: with min assist;sitting Pt Will Perform Upper Body Dressing: with min assist;sitting Pt Will Transfer to Toilet: with mod assist;bedside commode Additional ADL Goal #1: Pt will perform bed mobility with supervision in preparation for ADLs.  OT Frequency: Min 2X/week    Co-evaluation PT/OT/SLP Co-Evaluation/Treatment: Yes Reason for Co-Treatment: For patient/therapist safety;To address functional/ADL transfers PT goals addressed during session: Mobility/safety with mobility;Balance;Strengthening/ROM OT goals addressed during session: ADL's and  self-care      AM-PAC OT "  6 Clicks" Daily Activity     Outcome Measure Help from another person eating meals?: A Lot Help from another person taking care of personal grooming?: A Little Help from another person toileting, which includes using toliet, bedpan, or urinal?: Total Help from another person bathing (including washing, rinsing, drying)?: Total Help from another person to put on and taking off regular upper body clothing?: A Lot Help from another person to put on and taking off regular lower body clothing?: Total 6 Click Score: 10   End of Session Equipment Utilized During Treatment: Gait belt  Activity Tolerance: Patient tolerated treatment well Patient left: in chair;with call bell/phone within reach;with chair alarm set;with family/visitor present  OT Visit Diagnosis: Unsteadiness on feet (R26.81);Muscle weakness (generalized) (M62.81);Cognitive communication deficit (R41.841)                Time: 8159-4707 OT Time Calculation (min): 30 min Charges:  OT General Charges $OT Visit: 1 Visit OT Evaluation $OT Eval Moderate Complexity: [place], OTR/L Acute Rehabilitation Services Office: [phone]  Malka So 05/04/2022, 12:06 PM

## 2022-05-04 NOTE — Progress Notes (Signed)
PROGRESS NOTE  Peter Nichols KGM:[id number] DOB: 1946/07/02   PCP: [name], MD  Patient is from: Home.  DOA: 04/23/2022 LOS: 24  Chief complaints Chief Complaint  Patient presents with   Weakness     Brief Narrative / Interim history:  76 year old M with PMH of neuromuscular disorder, alcoholic cirrhosis with recurrent ascites, hepatic encephalopathy, hepatic hydrothorax, anemia and thrombocytopenia presented with weakness and admitted for decompensated alcoholic hepatic cirrhosis with cystitis, hepatic hydrothorax, acute hepatic encephalopathy and physical deconditioning.  He has lactic acidosis to 4.3.  PCCM consulted on 7/2.  He underwent paracentesis with removal of 3 L on 7/2. Right thoracocentesis with removal of 1 L on 7/6.  [facility] consulted on 7/3.  He had TIPS placed on 7/7 by IR.  Hospital course complicated by ileus requiring NG tube.   Patient with gradual decline, worsening hepatic encephalopathy, coagulopathy and failure to thrive picture.  Poor prognosis.  CODE STATUS changed to DNR/DNI after discussion with patient's wife and daughter at bedside.  Palliative medicine following.  7/15-TIPS revision with constrainment and therapeutic paracentesis with removal of 1.95 L.    Subjective:  No significant events overnight, reports he is feeling better, good appetite, no further urinary retention, but he had no bowel movement over last 36 hours.  Despite being compliant with his lactulose .  Objective: Vitals:   05/04/22 0000 05/04/22 0300 05/04/22 0818 05/04/22 1157  BP: (!) 128/43 (!) 110/39 (!) 112/59 (!) 140/47  Pulse: 81 82 78 81  Resp: 20 19 18 17   Temp:  97.8 F (36.6 C) 98.2 F (36.8 C) (!) 97.5 F (36.4 C)  TempSrc:  Oral Oral Oral  SpO2: 95% 92% 96% 97%  Weight:      Height:        Examination:  Awake Alert, Oriented X 3, appropriate, conversant in good spirits, extremely frail, deconditioned, chronically ill-appearing ,Asterixis + Symmetrical  Chest wall movement, Good air movement bilaterally, CTAB RRR,No Gallops,Rubs or new Murmurs, No Parasternal Heave +ve B.Sounds, Abd Soft, No tenderness, No rebound - guarding or rigidity. No Cyanosis, Clubbing or edema, No new Rash or bruise      Procedures:  7/2-paracentesis with removal of 3 L 7/6-right thoracocentesis with removal of 1 L 7/7-TIPS placement by IR 7/15-TIPS revision with constrainment, by placement of tandem 59m VBX and [id number] GORE stents. 7/15-paracentesis with removal of 1.95 L.  Microbiology summarized: 7/2-blood cultures NGTD. 7/2-peritoneal fluid culture NGTD.  Assessment and plan: Principal Problem:   Decompensated hepatic cirrhosis (HCC) Active Problems:   Thrombocytopenia (HCC)   Steatohepatitis, non-alcoholic   Alcoholic cirrhosis of liver with ascites (HCC)   Anemia, unspecified   Lactic acidosis   GERD (gastroesophageal reflux disease)   Generalized weakness   Goals of care, counseling/discussion   DNR (do not resuscitate)  Decompensated alcoholic hepatic cirrhosis with recurrent ascites and hepatic hydrothorax Elevated liver enzymes/hyperbilirubinemia: Bilirubin rising. Coagulopathy: INR 2.3. Acute hepatic encephalopathy and ileus after TIPS -S/p paracentesis with removal of 3 L on 7/2 and 1.95 L on 7/15. -S/p TIPS by IR on 7/7. -S/p TIPS revision with constrainment on 7/15, given severe hepatic encephalopathy, this morning he is much more awake, appropriate -[facility] due to age. -Inderal, Lasix and Aldactone held due to soft BP. -Lactulose enema 4 times daily -Changed Protonix to IV -Avoid sedating medications -GI signed off  Hepatic encephalopathy -Has worsened after the procedure, this morning much improved after TIPS revision with  constrainment. -Monitor ammonia level closely. -  continue with lactulose and rifaximin -On arrival continue to increase at 100 today, he is more somnolent  and sleepy today, but no profound encephalopathy, have increased his lactulose we will continue every 2 hours still he is moving his bowels.  Normocytic anemia/thrombocytopenia: Likely due to liver cirrhosis.  Stable. -Continue monitoring  Cough: CXR with recurrent right pleural effusion likely from hydrothorax.  He is also at risk for aspiration. -Supportive care   Lactic acidosis: Likely due to perfusion and decompensated liver cirrhosis.    Hyperkalemia/hyponatremia: Resolved.  Urinary retention - required in and out 7/15, so far no recurrence of retention, continue to monitor with bladder scan.  Thrombocytopenia -Due to liver cirrhosis, continue to monitor, so far no indication for transfusion  Goal of care counseling/physical deconditioning: DNR/DNI.  Grim prognosis -Palliative medicine following  Body mass index is 24.16 kg/m (pended).         DVT prophylaxis:  Place and maintain sequential compression device Start: 05/03/22 1116 SCDs Start: 04/25/2022 2044  Code Status: Full code Family Communication: Updated multiple family members at bedside Level of care: Telemetry Medical Status is: Inpatient Remains inpatient appropriate because: Decompensated liver cirrhosis, hepatic encephalopathy and physical deconditioning   Final disposition: TBD Consultants:  Gastroenterology-signed off Pulmonology-signed off Interventional radiology Palliative medicine  Sch Meds:  Scheduled Meds:  lactulose  30 g Oral QID   Or   lactulose  300 mL Rectal QID   lactulose  30 g Oral Q4H   midodrine  10 mg Oral TID WC   pantoprazole (PROTONIX) IV  40 mg Intravenous Q12H   rifaximin  550 mg Oral BID   tamsulosin  0.4 mg Oral QPC supper   Continuous Infusions:  sodium chloride Stopped (05/17/2022 1507)   PRN Meds:.acetaminophen **OR** acetaminophen, albuterol, guaiFENesin, [DISCONTINUED] ondansetron **OR** ondansetron (ZOFRAN) IV, mouth rinse  Antimicrobials: Anti-infectives  (From admission, onward)    Start     Dose/Rate Route Frequency Ordered Stop   05/02/22 0943  ceFAZolin (ANCEF) IVPB 2g/100 mL premix        over 30 Minutes Intravenous Continuous PRN 05/02/22 0944 05/02/22 0943   04/28/22 1000  rifaximin (XIFAXAN) tablet 550 mg        550 mg Oral 2 times daily 04/28/22 0741     04/19/22 1000  rifaximin (XIFAXAN) tablet 550 mg  Status:  Discontinued        550 mg Oral 2 times daily 05/02/2022 2043 04/26/22 1750   05/15/2022 1715  cefTRIAXone (ROCEPHIN) 2 g in sodium chloride 0.9 % 100 mL IVPB  Status:  Discontinued        2 g 200 mL/hr over 30 Minutes Intravenous Every 24 hours 04/19/2022 1712 04/25/22 1514        I have personally reviewed the following labs and images: CBC: Recent Labs  Lab 04/28/22 0255 04/29/22 0652 04/30/22 0439 05/01/22 0544 05/02/22 0124 05/04/22 0329  WBC 6.4 7.2 8.0 11.0* 8.1 6.9  NEUTROABS 4.4 4.9  --   --   --   --   HGB 10.2* 10.5* 10.3* 9.9* 10.2* 9.3*  HCT 29.1* 30.0* 30.1* 28.4* 29.8* 27.2*  MCV 100.3* 99.7 101.0* 100.0 101.4* 101.1*  PLT 74* 79* 80* 72* 76* 87*   BMP &GFR Recent Labs  Lab 04/30/22 0439 05/01/22 0544 05/02/22 0124 05/04/22 0329  NA 138 137 136 136  K 4.3 4.0 4.4 3.6  CL 108 107 110 110  CO2 22 21* 19* 22  GLUCOSE 123* 125* 118* 126*  BUN 11 13 13 15   CREATININE 0.91 0.92 0.72 0.94  CALCIUM 9.2 9.1 9.1 8.9  MG 1.9  --  2.0  --   PHOS 3.1  --   --   --    Estimated Creatinine Clearance: 67.9 mL/min (by C-G formula based on SCr of 0.94 mg/dL). Liver & Pancreas: Recent Labs  Lab 04/30/22 0439 05/01/22 0544 05/02/22 0124  AST 59* 59* 64*  ALT 45* 46* 45*  ALKPHOS 132* 127* 130*  BILITOT 7.7* 8.8* 9.8*  PROT 4.6* 4.2* 4.5*  ALBUMIN 2.7* 2.7* 2.8*   No results for input(s): "LIPASE", "AMYLASE" in the last 168 hours. Recent Labs  Lab 04/30/22 0439 05/01/22 0544 05/02/22 0124 05/03/22 0041 05/04/22 0329  AMMONIA 126* 119* 124* 92* 100*   Diabetic: No results for input(s):  "HGBA1C" in the last 72 hours. No results for input(s): "GLUCAP" in the last 168 hours.  Cardiac Enzymes: No results for input(s): "CKTOTAL", "CKMB", "CKMBINDEX", "TROPONINI" in the last 168 hours. No results for input(s): "PROBNP" in the last 8760 hours. Coagulation Profile: Recent Labs  Lab 04/30/22 0439 05/01/22 0544 05/02/22 0124  INR 2.3* 2.4* 2.3*   Thyroid Function Tests: No results for input(s): "TSH", "T4TOTAL", "FREET4", "T3FREE", "THYROIDAB" in the last 72 hours. Lipid Profile: No results for input(s): "CHOL", "HDL", "LDLCALC", "TRIG", "CHOLHDL", "LDLDIRECT" in the last 72 hours. Anemia Panel: No results for input(s): "VITAMINB12", "FOLATE", "FERRITIN", "TIBC", "IRON", "RETICCTPCT" in the last 72 hours. Urine analysis:    Component Value Date/Time   COLORURINE AMBER (A) 04/19/2022 0405   APPEARANCEUR CLEAR 04/19/2022 0405   LABSPEC 1.019 04/19/2022 0405   PHURINE 5.0 04/19/2022 0405   GLUCOSEU NEGATIVE 04/19/2022 0405   HGBUR NEGATIVE 04/19/2022 0405   BILIRUBINUR NEGATIVE 04/19/2022 0405   KETONESUR NEGATIVE 04/19/2022 0405   PROTEINUR NEGATIVE 04/19/2022 0405   UROBILINOGEN 1.0 11/27/2007 1029   NITRITE NEGATIVE 04/19/2022 0405   LEUKOCYTESUR NEGATIVE 04/19/2022 0405   Sepsis Labs: Invalid input(s): "PROCALCITONIN", "LACTICIDVEN"  Microbiology: No results found for this or any previous visit (from the past 240 hour(s)).  Radiology Studies: No results found.    Phillips Climes MD [facility]  If 7PM-7AM, please contact night-coverage [email or web address] 05/04/2022, 1:05 PM

## 2022-05-05 ENCOUNTER — Inpatient Hospital Stay (HOSPITAL_COMMUNITY): Payer: Medicare HMO

## 2022-05-05 ENCOUNTER — Other Ambulatory Visit (HOSPITAL_COMMUNITY): Payer: Medicare HMO

## 2022-05-05 DIAGNOSIS — Z66 Do not resuscitate: Secondary | ICD-10-CM | POA: Diagnosis not present

## 2022-05-05 DIAGNOSIS — Z7189 Other specified counseling: Secondary | ICD-10-CM | POA: Diagnosis not present

## 2022-05-05 DIAGNOSIS — K7290 Hepatic failure, unspecified without coma: Secondary | ICD-10-CM | POA: Diagnosis not present

## 2022-05-05 DIAGNOSIS — E43 Unspecified severe protein-calorie malnutrition: Secondary | ICD-10-CM | POA: Insufficient documentation

## 2022-05-05 DIAGNOSIS — K7031 Alcoholic cirrhosis of liver with ascites: Secondary | ICD-10-CM | POA: Diagnosis not present

## 2022-05-05 DIAGNOSIS — Z515 Encounter for palliative care: Secondary | ICD-10-CM | POA: Diagnosis not present

## 2022-05-05 DIAGNOSIS — K7460 Unspecified cirrhosis of liver: Secondary | ICD-10-CM | POA: Diagnosis not present

## 2022-05-05 LAB — COMPREHENSIVE METABOLIC PANEL
ALT: 40 U/L (ref 0–44)
AST: 60 U/L — ABNORMAL HIGH (ref 15–41)
Albumin: 2.6 g/dL — ABNORMAL LOW (ref 3.5–5.0)
Alkaline Phosphatase: 121 U/L (ref 38–126)
Anion gap: 8 (ref 5–15)
BUN: 19 mg/dL (ref 8–23)
CO2: 17 mmol/L — ABNORMAL LOW (ref 22–32)
Calcium: 8.8 mg/dL — ABNORMAL LOW (ref 8.9–10.3)
Chloride: 109 mmol/L (ref 98–111)
Creatinine, Ser: 1.13 mg/dL (ref 0.61–1.24)
GFR, Estimated: >60 mL/min (ref >60)
Glucose, Bld: 134 mg/dL — ABNORMAL HIGH (ref 70–99)
Potassium: 4.4 mmol/L (ref 3.5–5.1)
Sodium: 134 mmol/L — ABNORMAL LOW (ref 135–145)
Total Bilirubin: 9.5 mg/dL — ABNORMAL HIGH (ref 0.3–1.2)
Total Protein: 4.4 g/dL — ABNORMAL LOW (ref 6.5–8.1)

## 2022-05-05 LAB — CORTISOL: Cortisol, Plasma: 16 ug/dL

## 2022-05-05 LAB — AMMONIA: Ammonia: 116 umol/L — ABNORMAL HIGH (ref 9–35)

## 2022-05-05 LAB — MAGNESIUM: Magnesium: 2.1 mg/dL (ref 1.7–2.4)

## 2022-05-05 MED ORDER — GLYCOPYRROLATE 0.2 MG/ML IJ SOLN: 0.2000 mg | INTRAMUSCULAR | Status: DC | PRN

## 2022-05-05 MED ORDER — GLYCOPYRROLATE 1 MG PO TABS
1.0000 mg | ORAL_TABLET | ORAL | Status: DC | PRN
  Administered 2022-05-08: 1 mg via ORAL
  Filled 2022-05-05: qty 1

## 2022-05-05 MED ORDER — METHYLPREDNISOLONE SODIUM SUCC 125 MG IJ SOLR
80.0000 mg | Freq: Once | INTRAMUSCULAR | Status: AC
  Administered 2022-05-05: 80 mg via INTRAVENOUS
  Filled 2022-05-05: qty 2

## 2022-05-05 MED ORDER — SODIUM CHLORIDE 0.9 % IV BOLUS
500.0000 mL | Freq: Once | INTRAVENOUS | Status: AC
  Administered 2022-05-05: 500 mL via INTRAVENOUS

## 2022-05-05 MED ORDER — GLYCOPYRROLATE 0.2 MG/ML IJ SOLN
0.2000 mg | INTRAMUSCULAR | Status: DC | PRN
  Administered 2022-05-09: 0.2 mg via INTRAVENOUS
  Filled 2022-05-05: qty 1

## 2022-05-05 MED ORDER — HALOPERIDOL LACTATE 2 MG/ML PO CONC
0.5000 mg | ORAL | Status: DC | PRN
  Filled 2022-05-05: qty 5

## 2022-05-05 MED ORDER — ALBUMIN HUMAN 25 % IV SOLN
12.5000 g | INTRAVENOUS | Status: AC
  Administered 2022-05-05: 12.5 g via INTRAVENOUS
  Filled 2022-05-05: qty 50

## 2022-05-05 MED ORDER — HALOPERIDOL 0.5 MG PO TABS
0.5000 mg | ORAL_TABLET | ORAL | Status: DC | PRN
  Filled 2022-05-05: qty 1

## 2022-05-05 MED ORDER — MIDODRINE HCL 5 MG PO TABS
10.0000 mg | ORAL_TABLET | ORAL | Status: AC
  Administered 2022-05-05: 10 mg via ORAL
  Filled 2022-05-05: qty 2

## 2022-05-05 MED ORDER — MIDODRINE HCL 5 MG PO TABS
10.0000 mg | ORAL_TABLET | Freq: Three times a day (TID) | ORAL | Status: DC
  Administered 2022-05-05: 10 mg via ORAL
  Filled 2022-05-05 (×2): qty 2

## 2022-05-05 MED ORDER — MORPHINE SULFATE (PF) 2 MG/ML IV SOLN: 2.0000 mg | INTRAVENOUS | Status: DC | PRN

## 2022-05-05 MED ORDER — HALOPERIDOL LACTATE 5 MG/ML IJ SOLN: 0.5000 mg | INTRAMUSCULAR | Status: DC | PRN

## 2022-05-05 MED ORDER — POLYVINYL ALCOHOL 1.4 % OP SOLN
1.0000 [drp] | Freq: Four times a day (QID) | OPHTHALMIC | Status: DC | PRN
  Filled 2022-05-05: qty 15

## 2022-05-05 MED ORDER — ALBUMIN HUMAN 25 % IV SOLN
25.0000 g | Freq: Four times a day (QID) | INTRAVENOUS | Status: DC
  Administered 2022-05-05: 25 g via INTRAVENOUS
  Filled 2022-05-05: qty 100

## 2022-05-05 MED ORDER — SODIUM CHLORIDE 0.9 % IV BOLUS
250.0000 mL | INTRAVENOUS | Status: AC
  Administered 2022-05-05: 250 mL via INTRAVENOUS

## 2022-05-05 MED ORDER — LORAZEPAM 2 MG/ML IJ SOLN: 1.0000 mg | INTRAMUSCULAR | Status: DC | PRN

## 2022-05-05 NOTE — Progress Notes (Signed)
PROGRESS NOTE  Peter Nichols:[id number] DOB: Sep 26, 1946   PCP: [name], MD  Patient is from: Home.  DOA: 05/17/2022 LOS: 95  Chief complaints Chief Complaint  Patient presents with   Weakness     Brief Narrative / Interim history:  76 year old M with PMH of neuromuscular disorder, alcoholic cirrhosis with recurrent ascites, hepatic encephalopathy, hepatic hydrothorax, anemia and thrombocytopenia presented with weakness and admitted for decompensated alcoholic hepatic cirrhosis with cystitis, hepatic hydrothorax, acute hepatic encephalopathy and physical deconditioning.  He has lactic acidosis to 4.3.  PCCM consulted on 7/2.  He underwent paracentesis with removal of 3 L on 7/2. Right thoracocentesis with removal of 1 L on 7/6.  [facility] consulted on 7/3.  He had TIPS placed on 7/7 by IR.  Hospital course complicated by ileus requiring NG tube.   Patient with gradual decline, worsening hepatic encephalopathy, coagulopathy and failure to thrive picture.  Poor prognosis.  CODE STATUS changed to DNR/DNI after discussion with patient's wife and daughter at bedside.  Palliative medicine following.  7/15-TIPS revision with constrainment and therapeutic paracentesis with removal of 1.95 L.  -Patient encephalopathic has improved, but overall he remains very weak, deconditioned, level is improving, he had an episode of vomiting overnight 7/17, this morning he is more lethargic, confused, with low blood pressure despite receiving fluid boluses and albumin, and being on midodrine, palliative medicine has discussed further with the family decision has been made to proceed with full comfort measures.   Subjective:  Sent with an episode of vomiting overnight, more lethargic this morning, he had bowel movements x2 yesterday after multiple lactulose dosing.  Objective: Vitals:   05/05/22 0414 05/05/22 0823 05/05/22 1100 05/05/22 1204  BP: (!) 109/31 (!) 103/30 (!) 95/36 (!) 106/36  Pulse:  96 88 88   Resp: (!) 22 17 20 17   Temp: 99.6 F (37.6 C) 98.4 F (36.9 C)  97.7 F (36.5 C)  TempSrc: Axillary Axillary  Axillary  SpO2: 92% 95% 96% 96%  Weight:      Height:        Examination:  Awake, chronically ill-appearing, deconditioned, malnourished, interactive today Patient care entry at the bases Regular rate and rhythm Abdomen nontender, mildly distended Extremities with no edema or cyanosis    Procedures:  7/2-paracentesis with removal of 3 L 7/6-right thoracocentesis with removal of 1 L 7/7-TIPS placement by IR 7/15-TIPS revision with constrainment, by placement of tandem 73m VBX and 98mVIABAHN GORE stents. 7/15-paracentesis with removal of 1.95 L.  Microbiology summarized: 7/2-blood cultures NGTD. 7/2-peritoneal fluid culture NGTD.  Assessment and plan: Principal Problem:   Decompensated hepatic cirrhosis (HCC) Active Problems:   Thrombocytopenia (HCC)   Steatohepatitis, non-alcoholic   Alcoholic cirrhosis of liver with ascites (HCC)   Anemia, unspecified   Lactic acidosis   GERD (gastroesophageal reflux disease)   Generalized weakness   Goals of care, counseling/discussion   DNR (do not resuscitate)   Protein-calorie malnutrition, severe  Decompensated alcoholic hepatic cirrhosis with recurrent ascites and hepatic hydrothorax Elevated liver enzymes/hyperbilirubinemia: Bilirubin rising. Coagulopathy: INR 2.3. Acute hepatic encephalopathy and ileus after TIPS -S/p paracentesis with removal of 3 L on 7/2 and 1.95 L on 7/15. -S/p TIPS by IR on 7/7. -S/p TIPS revision with constrainment on 7/15, given severe hepatic encephalopathy, this morning he is much more awake, appropriate -[facility] due to age. -Inderal, Lasix and Aldactone held due to soft BP. -Changed Protonix to IV -Avoid sedating medications -GI  signed off -Lactulose  has been discontinued currently now is comfort care   Hepatic  encephalopathy -Has worsened after the procedure, proved after TIPS revision with constrained meant . -Xifaxan and lactulose discontinued now he is comfort care   Normocytic anemia/thrombocytopenia: Likely due to liver cirrhosis.    Cough: CXR with recurrent right pleural effusion likely from hydrothorax.  He is also at risk for aspiration. -Supportive care   Lactic acidosis: Likely due to perfusion and decompensated liver cirrhosis.    Hyperkalemia/hyponatremia: Resolved.  Urinary retention - required in and out 7/15, so far no recurrence of retention, continue to monitor with bladder scan.  Thrombocytopenia -Due to liver cirrhosis, continue to monitor,  Severe protein calorie malnutrition  Goal of care counseling/physical deconditioning: DNR/DNI.  Grim prognosis -Palliative medicine following, patient continues to worsen clinically despite of treatment, is extremely frail, deconditioned, with poor prognosis, at this point family wish to proceed with comfort measures after discussing with palliative medicine.  Body mass index is 24.16 kg/m (pended). Nutrition Problem: Severe Malnutrition Etiology: chronic illness (hepatic cirrhosis) Signs/Symptoms: severe muscle depletion, severe fat depletion Interventions: Carnation Instant Breakfast, MVI   DVT prophylaxis:  Place and maintain sequential compression device Start: 05/03/22 1116  Code Status: DNR/comfort Family Communication: Updated multiple family members at bedside Level of care: Telemetry Medical Status is: Inpatient Remains inpatient appropriate because: Decompensated liver cirrhosis, hepatic encephalopathy and physical deconditioning   Final disposition: TBD Consultants:  Gastroenterology-signed off Pulmonology-signed off Interventional radiology Palliative medicine  Sch Meds:  Scheduled Meds:  pantoprazole (PROTONIX) IV  40 mg Intravenous Q12H   Continuous Infusions:  sodium chloride Stopped (04/21/2022  1507)   PRN Meds:.acetaminophen **OR** acetaminophen, albuterol, glycopyrrolate **OR** glycopyrrolate **OR** glycopyrrolate, guaiFENesin, haloperidol **OR** haloperidol **OR** haloperidol lactate, LORazepam, morphine injection, [DISCONTINUED] ondansetron **OR** ondansetron (ZOFRAN) IV, mouth rinse, polyvinyl alcohol  Antimicrobials: Anti-infectives (From admission, onward)    Start     Dose/Rate Route Frequency Ordered Stop   05/02/22 0943  ceFAZolin (ANCEF) IVPB 2g/100 mL premix        over 30 Minutes Intravenous Continuous PRN 05/02/22 0944 05/02/22 0943   04/28/22 1000  rifaximin (XIFAXAN) tablet 550 mg  Status:  Discontinued        550 mg Oral 2 times daily 04/28/22 0741 05/05/22 1244   04/19/22 1000  rifaximin (XIFAXAN) tablet 550 mg  Status:  Discontinued        550 mg Oral 2 times daily 04/23/2022 2043 04/26/22 1750   04/27/2022 1715  cefTRIAXone (ROCEPHIN) 2 g in sodium chloride 0.9 % 100 mL IVPB  Status:  Discontinued        2 g 200 mL/hr over 30 Minutes Intravenous Every 24 hours 04/29/2022 1712 04/25/22 1514        I have personally reviewed the following labs and images: CBC: Recent Labs  Lab 04/29/22 0652 04/30/22 0439 05/01/22 0544 05/02/22 0124 05/04/22 0329  WBC 7.2 8.0 11.0* 8.1 6.9  NEUTROABS 4.9  --   --   --   --   HGB 10.5* 10.3* 9.9* 10.2* 9.3*  HCT 30.0* 30.1* 28.4* 29.8* 27.2*  MCV 99.7 101.0* 100.0 101.4* 101.1*  PLT 79* 80* 72* 76* 87*   BMP &GFR Recent Labs  Lab 04/30/22 0439 05/01/22 0544 05/02/22 0124 05/04/22 0329 05/05/22 0625  NA 138 137 136 136 134*  K 4.3 4.0 4.4 3.6 4.4  CL 108 107 110 110 109  CO2 22 21* 19* 22 17*  GLUCOSE 123* 125* 118* 126* 134*  BUN 11 13 13 15 19   CREATININE 0.91 0.92 0.72 0.94 1.13  CALCIUM 9.2 9.1 9.1 8.9 8.8*  MG 1.9  --  2.0  --  2.1  PHOS 3.1  --   --   --   --    Estimated Creatinine Clearance: 56.5 mL/min (by C-G formula based on SCr of 1.13 mg/dL). Liver & Pancreas: Recent Labs  Lab 04/30/22 0439  05/01/22 0544 05/02/22 0124 05/05/22 0625  AST 59* 59* 64* 60*  ALT 45* 46* 45* 40  ALKPHOS 132* 127* 130* 121  BILITOT 7.7* 8.8* 9.8* 9.5*  PROT 4.6* 4.2* 4.5* 4.4*  ALBUMIN 2.7* 2.7* 2.8* 2.6*   No results for input(s): "LIPASE", "AMYLASE" in the last 168 hours. Recent Labs  Lab 05/01/22 0544 05/02/22 0124 05/03/22 0041 05/04/22 0329 05/05/22 0625  AMMONIA 119* 124* 92* 100* 116*   Diabetic: No results for input(s): "HGBA1C" in the last 72 hours. No results for input(s): "GLUCAP" in the last 168 hours.  Cardiac Enzymes: No results for input(s): "CKTOTAL", "CKMB", "CKMBINDEX", "TROPONINI" in the last 168 hours. No results for input(s): "PROBNP" in the last 8760 hours. Coagulation Profile: Recent Labs  Lab 04/30/22 0439 05/01/22 0544 05/02/22 0124  INR 2.3* 2.4* 2.3*   Thyroid Function Tests: No results for input(s): "TSH", "T4TOTAL", "FREET4", "T3FREE", "THYROIDAB" in the last 72 hours. Lipid Profile: No results for input(s): "CHOL", "HDL", "LDLCALC", "TRIG", "CHOLHDL", "LDLDIRECT" in the last 72 hours. Anemia Panel: No results for input(s): "VITAMINB12", "FOLATE", "FERRITIN", "TIBC", "IRON", "RETICCTPCT" in the last 72 hours. Urine analysis:    Component Value Date/Time   COLORURINE AMBER (A) 04/19/2022 0405   APPEARANCEUR CLEAR 04/19/2022 0405   LABSPEC 1.019 04/19/2022 0405   PHURINE 5.0 04/19/2022 0405   GLUCOSEU NEGATIVE 04/19/2022 0405   HGBUR NEGATIVE 04/19/2022 0405   BILIRUBINUR NEGATIVE 04/19/2022 0405   KETONESUR NEGATIVE 04/19/2022 0405   PROTEINUR NEGATIVE 04/19/2022 0405   UROBILINOGEN 1.0 11/27/2007 1029   NITRITE NEGATIVE 04/19/2022 0405   LEUKOCYTESUR NEGATIVE 04/19/2022 0405   Sepsis Labs: Invalid input(s): "PROCALCITONIN", "LACTICIDVEN"  Microbiology: No results found for this or any previous visit (from the past 240 hour(s)).  Radiology Studies: DG Chest Port 1 View  Result Date: 05/05/2022 CLINICAL DATA:  Generalized weakness,  difficulty breathing EXAM: PORTABLE CHEST 1 VIEW COMPARISON:  Previous studies including the examination of 05/01/2022 FINDINGS: There is improvement in aeration of right upper lung field. There is haziness in the right mid and both lower lung fields. This may be partly due to layering of pleural effusions. Evaluation of lower lung fields for infiltrates is limited by the effusions. There is no pneumothorax. IMPRESSION: Small to moderate bilateral pleural effusions, more so on the right side. There is interval improvement in aeration of right lung, possibly suggesting decrease in right pleural effusion. Electronically Signed   By: Elmer Picker M.D.   On: 05/05/2022 08:25      Phillips Climes MD [facility]  If 7PM-7AM, please contact night-coverage [email or web address] 05/05/2022, 2:27 PM

## 2022-05-05 NOTE — Progress Notes (Signed)
Referring Physician(s): Dr. Margurite Auerbach  Supervising Physician: Michaelle Birks  Patient Status:  [facility] - In-pt  Chief Complaint: Worsening hepatic function s/p TIPS on 7.7.23 and a  TIPS revision on 7.16.23. Hospital course complicated by ileus and ongoing hepatic encephalopathy. Per note from Palliative care patient transitioning to comfort care.    Subjective: Patient states that he hurts allover. Asking to "drink a beer" Family at bedside.   Allergies: Ensure  Medications: Prior to Admission medications   Medication Sig Start Date End Date Taking? Authorizing Provider  b complex vitamins capsule Take 1 capsule by mouth daily.   Yes [provider]  calcium carbonate (TUMS - DOSED IN MG ELEMENTAL CALCIUM) 500 MG chewable tablet Chew 1 tablet by mouth daily as needed for indigestion or heartburn.   Yes [provider]  fluticasone (FLONASE) 50 MCG/ACT nasal spray Place 1-2 sprays into both nostrils daily as needed for allergies or rhinitis.   Yes [provider]  furosemide (LASIX) 40 MG tablet Take 40 mg by mouth daily. 01/09/22  Yes [provider]  lactulose (CHRONULAC) 10 GM/15ML solution Take 45 mLs (30 g total) by mouth 3 (three) times daily. Patient taking differently: Take 30 g by mouth 2 (two) times daily. 01/02/22  Yes Allie Bossier, MD  Multiple Vitamins-Minerals (MULTIVITAMIN MEN 50+) TABS Take 1 tablet by mouth daily with breakfast.   Yes [provider]  pantoprazole (PROTONIX) 40 MG tablet Take 40 mg by mouth daily before breakfast.   Yes [provider]  pravastatin (PRAVACHOL) 40 MG tablet Take 40 mg by mouth daily.   Yes [provider]  Protein POWD Take 1 Scoop by mouth See admin instructions. Unnamed WHEY-FREE protein powder: Mix 1 scoopful of powder into the appropriate amount of liquid and drink once a day as needed for nutritional support   Yes [provider]  rifaximin (XIFAXAN) 550 MG TABS  tablet Take 1 tablet (550 mg total) by mouth 2 (two) times daily. 01/02/22  Yes Allie Bossier, MD  spironolactone (ALDACTONE) 50 MG tablet Take 150 mg by mouth daily. 01/09/22  Yes [provider]  feeding supplement (ENSURE ENLIVE / ENSURE PLUS) LIQD Take 237 mLs by mouth daily. Patient not taking: Reported on 04/19/2022 01/02/22   Allie Bossier, MD  Multiple Vitamin (MULTIVITAMIN WITH MINERALS) TABS tablet Take 1 tablet by mouth daily. Patient not taking: Reported on 04/22/2022 05/25/21   Annita Brod, MD  Nutritional Supplements (,FEEDING SUPPLEMENT, PROSOURCE PLUS) liquid Take 30 mLs by mouth 2 (two) times daily between meals. Patient not taking: Reported on 05/08/2022 01/02/22   Allie Bossier, MD  pravastatin (PRAVACHOL) 20 MG tablet Take 3 tablets (60 mg total) by mouth daily. Patient not taking: Reported on 05/04/2022 01/03/22   Allie Bossier, MD  propranolol (INDERAL) 10 MG tablet Take 1 tablet (10 mg total) by mouth 2 (two) times daily. Patient not taking: Reported on 04/19/2022 06/03/21   Elgergawy, Silver Huguenin, MD     Vital Signs: BP (!) 106/36 (BP Location: Left Leg)   Pulse 88   Temp 97.7 F (36.5 C) (Axillary)   Resp 17   Ht 5' 9"  (1.753 m)   Wt (P) 163 lb 9.3 oz (74.2 kg)   SpO2 96%   BMI (P) 24.16 kg/m   Physical Exam  Imaging: DG Chest Port 1 View  Result Date: 05/05/2022 CLINICAL DATA:  Generalized weakness, difficulty breathing EXAM: PORTABLE CHEST 1 VIEW COMPARISON:  Previous studies including the examination of 05/01/2022 FINDINGS: There is improvement in aeration of right upper lung field. There is haziness in the right mid and both lower lung fields. This may be partly due to layering of pleural effusions. Evaluation of lower lung fields for infiltrates is limited by the effusions. There is no pneumothorax. IMPRESSION: Small to moderate bilateral pleural effusions, more so on the right side. There is interval improvement in aeration of right lung, possibly  suggesting decrease in right pleural effusion. Electronically Signed   By: Elmer Picker M.D.   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 Clinical picture suspicious for over shunting. EXAM: Procedures: 1. PORTAL and HEPATIC VENOGRAPHY 2. TRANSJUGULAR INTRAHEPATIC PORTOSYSTEMIC SHUNT (TIPS) REVISION 3. THERAPEUTIC PARACENTESIS MEDICATIONS: As antibiotic prophylaxis, Ancef 2 gm IV was ordered pre-procedure and administered intravenously within one hour of incision. ANESTHESIA/SEDATION: Moderate (conscious) sedation was employed during this procedure. A total of Versed 1 mg and Fentanyl 50 mcg was administered intravenously. Moderate Sedation Time: 116 minutes. The patient's level of consciousness and vital signs were monitored continuously by radiology nursing throughout the procedure under my direct supervision. CONTRAST:  80 mL Omnipaque 300 FLUOROSCOPY TIME:  Fluoroscopic dose; 412 mGy COMPLICATIONS: None immediate. PROCEDURE: Informed written consent was obtained from the the patient and/or patient's representative after a thorough discussion of the procedural risks, benefits and alternatives. All questions were addressed. Maximal Sterile Barrier Technique was utilized including caps, mask, sterile gowns, sterile gloves, sterile drape, hand hygiene and skin antiseptic. The skin overlying the RIGHT upper abdominal quadrant as well as the RIGHT neck were prepped and draped in usual sterile fashion. A timeout was performed prior to the initiation of the procedure. THERAPEUTIC PARACENTESIS; Initial ultrasound scanning demonstrates a moderate amount of ascites within the right lower abdominal quadrant. The RIGHT lower abdomen was prepped and draped in the usual sterile fashion. 1% lidocaine  with epinephrine was used for local anesthesia. An ultrasound image was saved for documentation purposed. An 8 Fr Safe-T-Centesis catheter was introduced. The paracentesis was performed as we proceeded with the TIPS revision. TIPS REVISION; 1% Lidocaine was used for local anesthesia. Ultrasound evaluation showed a patent RIGHT internal jugular vein. Ultrasound image was saved and sent to PACS. Under ultrasound guidance, the right internal jugular vein was access using a 21-G needle which was dilated and exchanged for a 5 Fr sheath over a guidewire. Under fluoroscopic guidance, utilizing a 5 Fr MPA catheter and 0.035-inch Bentson guidewire manipulation, the catheter was advanced through the TIPS shunt and into the main portal vein. The catheter was exchanged for a 5 Fr Omni-flush catheter and a digital subtraction venography was performed. The TIPS shunt was patent and flow was identified within the intrahepatic portal veins. A 0.035 inch Amplatz wire was placed then the Omni flush catheter was removed. A 16 Fr 35 cm Flexor sheath was placed over the wire. Pre-dilatation pressure measurements were performed in the portal vein and in the RIGHT atrium. An additional Amplatz wire was placed into the portal vein. Parallel 5 mm x 1.9 cm balloon inflatable VBX and 9 mm x 7.5 cm self expanding VIABAHN stents were placed within the shunt. High-resolution fluoroscopic images were obtained to ensure appropriate positioning. Serially, the balloon inflatable then self expanding stents were deployed under direct visualization. Completion portal  venogram demonstrated a successful deployment of the stents with appropriate constrainment. Post dilatation pressure measurements were obtained following the revision in a similar fashion. Images were reviewed and the procedure was terminated. All wires, catheters and sheaths were removed from the patient. Hemostasis was achieved at the RIGHT neck and RIGHT upper abdominal access sites  with manual compression. Dressings were applied. The patient tolerated the procedure well without immediate postprocedural complication. Pre-TIPS revision mean Pressures (mmHg): Right atrium: 2 Portal vein: 16 Portosystemic gradient: 14 Post-TIPS revision mean Pressures (mmHg): Right atrium:1 Portal vein: 18 Portosystemic gradient: 17 FINDINGS: 1. No residual flow into splenic vein varix s/p prior coil embolization. 2. TIPS revision with constrainment, by placement of parallel 57m VBX and 910mVIABAHN GORE stents. 3. Increase of portal HTN with portosystemic gradient from 14 to 17 4. Therapeutic paracentesis with 1.95 L of serous ascites removed. IMPRESSION: 1. Successful revision of transjugular intrahepatic portosystemic shunt (TIPS) by constraintment with intended increase in portal systemic gradient, as above. 2. Successful ultrasound-guided therapeutic paracentesis yielding 1.95 L of ascitic fluid. PLAN: 1. Post jugular sheath removal orders including bedrest x1 hrs. No HOB restrictions. 2. Continue lactulose TID and monitor for change in hepatic encephalopathy. 3. The patient will be closely followed in the Vascular Interventional Radiology (VIR) portal hypertension clinic by my colleague Dr. SuSerafina Royalsn approximately 4 weeks. JoMichaelle BirksMD [facility] Electronically Signed   By: JoMichaelle Birks.D.   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 Clinical picture suspicious for over shunting. EXAM: Procedures: 1. PORTAL and HEPATIC VENOGRAPHY 2. TRANSJUGULAR INTRAHEPATIC PORTOSYSTEMIC SHUNT (TIPS) REVISION 3. THERAPEUTIC PARACENTESIS MEDICATIONS: As antibiotic prophylaxis, Ancef 2 gm IV was ordered pre-procedure and  administered intravenously within one hour of incision. ANESTHESIA/SEDATION: Moderate (conscious) sedation was employed during this procedure. A total of Versed 1 mg and Fentanyl 50 mcg was administered intravenously. Moderate Sedation Time: 116 minutes. The patient's level of consciousness and vital signs were monitored continuously by radiology nursing throughout the procedure under my direct supervision. CONTRAST:  80 mL Omnipaque 300 FLUOROSCOPY TIME:  Fluoroscopic dose; 91242Gy COMPLICATIONS: None immediate. PROCEDURE: Informed written consent was obtained from the the patient and/or patient's representative after a thorough discussion of the procedural risks, benefits and alternatives. All questions were addressed. Maximal Sterile Barrier Technique was utilized including caps, mask, sterile gowns, sterile gloves, sterile drape, hand hygiene and skin antiseptic. The skin overlying the RIGHT upper abdominal quadrant as well as the RIGHT neck were prepped and draped in usual sterile fashion. A timeout was performed prior to the initiation of the procedure. THERAPEUTIC PARACENTESIS; Initial ultrasound scanning demonstrates a moderate amount of ascites within the right lower abdominal quadrant. The RIGHT lower abdomen was prepped and draped in the usual sterile fashion. 1% lidocaine with epinephrine was used for local anesthesia. An ultrasound image was saved for documentation purposed. An 8 Fr Safe-T-Centesis catheter was introduced. The paracentesis was performed as we proceeded with the TIPS revision. TIPS REVISION; 1% Lidocaine was used for local anesthesia. Ultrasound evaluation showed a patent RIGHT internal jugular vein. Ultrasound image was saved and sent to PACS. Under ultrasound guidance, the right internal jugular vein was access using a 21-G needle which was dilated and exchanged for a 5 Fr sheath  over a guidewire. Under fluoroscopic guidance, utilizing a 5 Fr MPA catheter and 0.035-inch Bentson  guidewire manipulation, the catheter was advanced through the TIPS shunt and into the main portal vein. The catheter was exchanged for a 5 Fr Omni-flush catheter and a digital subtraction venography was performed. The TIPS shunt was patent and flow was identified within the intrahepatic portal veins. A 0.035 inch Amplatz wire was placed then the Omni flush catheter was removed. A 16 Fr 35 cm Flexor sheath was placed over the wire. Pre-dilatation pressure measurements were performed in the portal vein and in the RIGHT atrium. An additional Amplatz wire was placed into the portal vein. Parallel 5 mm x 1.9 cm balloon inflatable VBX and 9 mm x 7.5 cm self expanding VIABAHN stents were placed within the shunt. High-resolution fluoroscopic images were obtained to ensure appropriate positioning. Serially, the balloon inflatable then self expanding stents were deployed under direct visualization. Completion portal venogram demonstrated a successful deployment of the stents with appropriate constrainment. Post dilatation pressure measurements were obtained following the revision in a similar fashion. Images were reviewed and the procedure was terminated. All wires, catheters and sheaths were removed from the patient. Hemostasis was achieved at the RIGHT neck and RIGHT upper abdominal access sites with manual compression. Dressings were applied. The patient tolerated the procedure well without immediate postprocedural complication. Pre-TIPS revision mean Pressures (mmHg): Right atrium: 2 Portal vein: 16 Portosystemic gradient: 14 Post-TIPS revision mean Pressures (mmHg): Right atrium:1 Portal vein: 18 Portosystemic gradient: 17 FINDINGS: 1. No residual flow into splenic vein varix s/p prior coil embolization. 2. TIPS revision with constrainment, by placement of parallel 41m VBX and 910mVIABAHN GORE stents. 3. Increase of portal HTN with portosystemic gradient from 14 to 17 4. Therapeutic paracentesis with 1.95 L of serous  ascites removed. IMPRESSION: 1. Successful revision of transjugular intrahepatic portosystemic shunt (TIPS) by constraintment with intended increase in portal systemic gradient, as above. 2. Successful ultrasound-guided therapeutic paracentesis yielding 1.95 L of ascitic fluid. PLAN: 1. Post jugular sheath removal orders including bedrest x1 hrs. No HOB restrictions. 2. Continue lactulose TID and monitor for change in hepatic encephalopathy. 3. The patient will be closely followed in the Vascular Interventional Radiology (VIR) portal hypertension clinic by my colleague Dr. SuSerafina Royalsn approximately 4 weeks. JoMichaelle BirksMD [facility] Electronically Signed   By: JoMichaelle Birks.D.   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 Clinical picture suspicious for over shunting. EXAM: Procedures: 1. PORTAL and HEPATIC VENOGRAPHY 2. TRANSJUGULAR INTRAHEPATIC PORTOSYSTEMIC SHUNT (TIPS) REVISION 3. THERAPEUTIC PARACENTESIS MEDICATIONS: As antibiotic prophylaxis, Ancef 2 gm IV was ordered pre-procedure and administered intravenously within one hour of incision. ANESTHESIA/SEDATION: Moderate (conscious) sedation was employed during this procedure. A total of Versed 1 mg and Fentanyl 50 mcg was administered intravenously. Moderate Sedation Time: 116 minutes. The patient's level of consciousness and vital signs were monitored continuously by radiology nursing throughout the procedure under my direct supervision. CONTRAST:  80 mL Omnipaque 300 FLUOROSCOPY TIME:  Fluoroscopic dose; 91825Gy COMPLICATIONS: None immediate. PROCEDURE: Informed written consent was obtained from the the patient and/or patient's representative after a thorough discussion  of the procedural risks, benefits and alternatives. All questions were addressed. Maximal Sterile Barrier Technique was utilized including caps,  mask, sterile gowns, sterile gloves, sterile drape, hand hygiene and skin antiseptic. The skin overlying the RIGHT upper abdominal quadrant as well as the RIGHT neck were prepped and draped in usual sterile fashion. A timeout was performed prior to the initiation of the procedure. THERAPEUTIC PARACENTESIS; Initial ultrasound scanning demonstrates a moderate amount of ascites within the right lower abdominal quadrant. The RIGHT lower abdomen was prepped and draped in the usual sterile fashion. 1% lidocaine with epinephrine was used for local anesthesia. An ultrasound image was saved for documentation purposed. An 8 Fr Safe-T-Centesis catheter was introduced. The paracentesis was performed as we proceeded with the TIPS revision. TIPS REVISION; 1% Lidocaine was used for local anesthesia. Ultrasound evaluation showed a patent RIGHT internal jugular vein. Ultrasound image was saved and sent to PACS. Under ultrasound guidance, the right internal jugular vein was access using a 21-G needle which was dilated and exchanged for a 5 Fr sheath over a guidewire. Under fluoroscopic guidance, utilizing a 5 Fr MPA catheter and 0.035-inch Bentson guidewire manipulation, the catheter was advanced through the TIPS shunt and into the main portal vein. The catheter was exchanged for a 5 Fr Omni-flush catheter and a digital subtraction venography was performed. The TIPS shunt was patent and flow was identified within the intrahepatic portal veins. A 0.035 inch Amplatz wire was placed then the Omni flush catheter was removed. A 16 Fr 35 cm Flexor sheath was placed over the wire. Pre-dilatation pressure measurements were performed in the portal vein and in the RIGHT atrium. An additional Amplatz wire was placed into the portal vein. Parallel 5 mm x 1.9 cm balloon inflatable VBX and 9 mm x 7.5 cm  self expanding VIABAHN stents were placed within the shunt. High-resolution fluoroscopic images were obtained to ensure appropriate positioning. Serially, the balloon inflatable then self expanding stents were deployed under direct visualization. Completion portal venogram demonstrated a successful deployment of the stents with appropriate constrainment. Post dilatation pressure measurements were obtained following the revision in a similar fashion. Images were reviewed and the procedure was terminated. All wires, catheters and sheaths were removed from the patient. Hemostasis was achieved at the RIGHT neck and RIGHT upper abdominal access sites with manual compression. Dressings were applied. The patient tolerated the procedure well without immediate postprocedural complication. Pre-TIPS revision mean Pressures (mmHg): Right atrium: 2 Portal vein: 16 Portosystemic gradient: 14 Post-TIPS revision mean Pressures (mmHg): Right atrium:1 Portal vein: 18 Portosystemic gradient: 17 FINDINGS: 1. No residual flow into splenic vein varix s/p prior coil embolization. 2. TIPS revision with constrainment, by placement of parallel 30m VBX and 959mVIABAHN GORE stents. 3. Increase of portal HTN with portosystemic gradient from 14 to 17 4. Therapeutic paracentesis with 1.95 L of serous ascites removed. IMPRESSION: 1. Successful revision of transjugular intrahepatic portosystemic shunt (TIPS) by constraintment with intended increase in portal systemic gradient, as above. 2. Successful ultrasound-guided therapeutic paracentesis yielding 1.95 L of ascitic fluid. PLAN: 1. Post jugular sheath removal orders including bedrest x1 hrs. No HOB restrictions. 2. Continue lactulose TID and monitor for change in hepatic encephalopathy. 3. The patient will be closely followed in the Vascular Interventional Radiology (VIR) portal hypertension clinic by my colleague Dr. SuSerafina Royalsn approximately 4 weeks. JoMichaelle BirksMD [facility] Electronically Signed   By: JoMichaelle Birks.D.   On: 05/03/2022 17:02    Labs:  CBC: Recent Labs    04/30/22 0439 05/01/22 0544 05/02/22 0124 05/04/22 0329  WBC 8.0 11.0*  8.1 6.9  HGB 10.3* 9.9* 10.2* 9.3*  HCT 30.1* 28.4* 29.8* 27.2*  PLT 80* 72* 76* 87*    COAGS: Recent Labs    05/27/21 1634 11/07/21 1241 12/10/21 1245 12/23/21 1208 03/17/22 1147 05/06/2022 1755 04/25/22 0119 04/30/22 0439 05/01/22 0544 05/02/22 0124  INR 1.5*   < > 1.6* 1.7* 1.7*   < > 2.2* 2.3* 2.4* 2.3*  APTT 31  --  35 34 33  --   --   --   --   --    < > = values in this interval not displayed.    BMP: Recent Labs    05/01/22 0544 05/02/22 0124 05/04/22 0329 05/05/22 0625  NA 137 136 136 134*  K 4.0 4.4 3.6 4.4  CL 107 110 110 109  CO2 21* 19* 22 17*  GLUCOSE 125* 118* 126* 134*  BUN 13 13 15 19   CALCIUM 9.1 9.1 8.9 8.8*  CREATININE 0.92 0.72 0.94 1.13  GFRNONAA >60 >60 >60 >60    LIVER FUNCTION TESTS: Recent Labs    04/30/22 0439 05/01/22 0544 05/02/22 0124 05/05/22 0625  BILITOT 7.7* 8.8* 9.8* 9.5*  AST 59* 59* 64* 60*  ALT 45* 46* 45* 40  ALKPHOS 132* 127* 130* 121  PROT 4.6* 4.2* 4.5* 4.4*  ALBUMIN 2.7* 2.7* 2.8* 2.6*    Assessment and Plan:  76 y.o. male inpatient. History of HTN, chronic thrombocytopenia, GERD, EtOH cirrhosis with recurrent ascites, portal hypertension, hepatic encephalopathy,  hyperbilirubinemia, hepatic hydrothorax and ectopic varix s/p TIPS and ectopic variceal embolization on 7.7.23 with IR Attending Dr. Serafina Royals.  Due to worsening hepatic function a TIPS revision was performed on 7.16.23. Hospital course complicated by ileus and ongoing hepatic encephalopathy.  Per note from Palliative care patient transitioning to comfort care.    IR will continue to follow along - plans per Primary Team and Palliative  Electronically Signed: Jacqualine Mau, NP 05/05/2022, 2:21 PM   I spent a total of 15  Minutes at the patient's bedside AND on the patient's hospital floor or unit, greater than 50% of which was counseling/coordinating care for s/p TIPS on 7.7.23 and TIPS revision on 7.16.23

## 2022-05-05 NOTE — Progress Notes (Signed)
Pt and pt's wife politely refused all po medications this am. All scheduled IV medications were administered.

## 2022-05-05 NOTE — Progress Notes (Signed)
Received a call from bedside RN regarding the patient's blood pressure being mildly elevated with systolic BPs in the mid to high 130's and his wife being concerned about hypertension.    Presented at bedside.  His wife was in the room and later 3 other family members entered the room.  The patient was weak appearing.  He denied having any pain.  Stool was noted in the bed.  Abdomen was distended and non tender with moderate palpation.  Bowels sounds were present.  No changes were made at that time.    Family members wanted an abdominal Xray, but with no new symptoms and in light of recent paracentesis on 05/02/22, and [place] ascites 05/01/22, portable abdominal xray 04/29/22, the writer did not see an indication to order an abdominal xray overnight.  Later, approximately 3-4 hours later I received another call from bedside RN Thayer Ohm regarding the patient's being hypotensive.  Advised to give early dose of Midodrine 10 mg x 1.  BP was repeated>> MAP 61.  Received another call for MAP 51, then 47.  Ordered an additional 10 mg x 1 midodrine, IV albumin 12.5 G x 2, NS bolus 250 cc x 2, to maintain MAP>65.  Upon following up, per RN, the patient is alert.  After being cleaned in bed, his  BP is 113:/55.

## 2022-05-05 NOTE — Progress Notes (Signed)
                                                                                                                                                                                                         Daily Progress Note   Patient Name: Peter Nichols       Date: 05/05/2022 DOB: 05/08/1946  Age: 76 y.o. MRN#: [id number] Attending Physician: Elgergawy, Dawood S, MD Primary Care Physician: White, Cynthia, MD Admit Date: 05/06/2022  Reason for Consultation/Follow-up: Establishing goals of care  Patient Profile/HPI:   76 y.o. male  with past medical history of neuromuscular disorder, alcoholic cirrhosis with recurrent ascites, hepatic encephalopathy, hepatic hydrothorax, anemia and thrombocytopenia  admitted on 05/15/2022 with decompensated alcoholic hepatic cirrhosis with cystitis, hepatic hydrothorax, acute hepatic encephalopathy and physical deconditioning. He underwent paracentesis with removal of 3 L on 7/2. Right thoracocentesis with removal of 1 L on 7/6.He had TIPS placed on 7/7 by IR.  Hospital course complicated by ileus and ongoing hepatic encephalopathy.  PMT consulted to discuss goals of care.  Subjective: Chart reviewed including labs imaging and progress notes.  Noted he had TIPS revision today with increase in portal blood flow.  Ammonia remains elevated.  He had some improvement over the weekend.  However last night and this morning he has ongoing hypotension and tachycardia. I met with his daughters and his spouse.  They share that they spoke with the physician this morning who explained to them that the feeling is there are no further interventions that can be provided.  They are all in agreement with transition to comfort measures.  [name] has a few siblings who are coming in to visit they would like to try and avoid sedating medications until they cannot visit.  I discussed the role of comfort care and that medications will only be given in the event that he is in need.  Review of  Systems  Unable to perform ROS: Mental acuity     Physical Exam Vitals and nursing note reviewed.  Constitutional:      Appearance: He is ill-appearing.     Comments: Frail, cachectic  Pulmonary:     Effort: Pulmonary effort is normal.     Comments: cough Abdominal:     General: There is distension.  Skin:    Coloration: Skin is jaundiced.  Neurological:     Comments: somnolent             Vital Signs: BP (!) 106/36 (BP Location: Left Leg)   Pulse 88   Temp 97.7 F (36.5 C) (  Axillary)   Resp 17   Ht 5' 9" (1.753 m)   Wt (P) 74.2 kg   SpO2 96%   BMI (P) 24.16 kg/m  SpO2: SpO2: 96 % O2 Device: O2 Device: Room Air O2 Flow Rate: O2 Flow Rate (L/min): 2 L/min  Intake/output summary:  Intake/Output Summary (Last 24 hours) at 05/05/2022 1251 Last data filed at 05/05/2022 0540 Gross per 24 hour  Intake --  Output 617 ml  Net -617 ml    LBM: Last BM Date : 04/30/22 Baseline Weight: Weight: 71.2 kg Most recent weight: Weight: (P) 74.2 kg       Palliative Assessment/Data: PPS: 20%      Patient Active Problem List   Diagnosis Date Noted   Protein-calorie malnutrition, severe 05/05/2022   Goals of care, counseling/discussion 04/30/2022   DNR (do not resuscitate) 04/30/2022   Lactic acidosis 04/24/2022   Generalized weakness 05/12/2022   Decompensated hepatic cirrhosis (HCC)    GERD (gastroesophageal reflux disease)    Ascites of liver 01/01/2022   Anemia, unspecified 01/01/2022   Hyperglycemia 01/01/2022   Hepatic encephalopathy (HCC) 12/31/2021   Pancreatitis, acute 06/01/2021   AKI (acute kidney injury) (HCC) 05/27/2021   Near syncope 05/27/2021   Steatohepatitis, non-alcoholic    Hyperlipidemia    Overweight (BMI 25.0-29.9)    Alcoholic cirrhosis of liver with ascites (HCC)    Acute hepatic encephalopathy (HCC) 05/22/2021   Thrombocytopenia (HCC) 05/04/2017    Palliative Care Assessment & Plan    Assessment/Recommendations/Plan  Transition to  full comfort measures only-DC labs and interventions and medications not affecting comfort Comfort medications entered as ordered-morphine 2 mg IV for pain, discomfort, or shortness of breath Anticipate he will decline rather rapidly-we will evaluate for stability for transfer to possible hospice after transition to comfort has been completed  Code Status: DNR  Prognosis:  Hours - Days   Discharge Planning: Anticipated Hospital Death  Care plan was discussed with patient family and care team.   Thank you for allowing the Palliative Medicine Team to assist in the care of this patient.  Total time: 60 mins     Greater than 50%  of this time was spent counseling and coordinating care related to the above assessment and plan.   , AGNP-C Palliative Medicine   Please contact Palliative Medicine Team phone at [phone] for questions and concerns.        

## 2022-05-06 DIAGNOSIS — K7682 Hepatic encephalopathy: Secondary | ICD-10-CM | POA: Diagnosis not present

## 2022-05-06 DIAGNOSIS — K7290 Hepatic failure, unspecified without coma: Secondary | ICD-10-CM | POA: Diagnosis not present

## 2022-05-06 DIAGNOSIS — Z7189 Other specified counseling: Secondary | ICD-10-CM | POA: Diagnosis not present

## 2022-05-06 DIAGNOSIS — K7031 Alcoholic cirrhosis of liver with ascites: Secondary | ICD-10-CM | POA: Diagnosis not present

## 2022-05-06 MED ORDER — MIDODRINE HCL 5 MG PO TABS
10.0000 mg | ORAL_TABLET | Freq: Three times a day (TID) | ORAL | Status: DC
Start: 2022-05-06 — End: 2022-05-10
  Administered 2022-05-06 – 2022-05-09 (×11): 10 mg via ORAL
  Filled 2022-05-06 (×11): qty 2

## 2022-05-06 MED ORDER — TAMSULOSIN HCL 0.4 MG PO CAPS
0.4000 mg | ORAL_CAPSULE | Freq: Every day | ORAL | Status: DC
  Administered 2022-05-06 – 2022-05-09 (×4): 0.4 mg via ORAL
  Filled 2022-05-06 (×4): qty 1

## 2022-05-06 MED ORDER — MIDODRINE HCL 5 MG PO TABS: 5.0000 mg | ORAL_TABLET | Freq: Three times a day (TID) | ORAL | Status: DC

## 2022-05-06 MED ORDER — RIFAXIMIN 550 MG PO TABS
550.0000 mg | ORAL_TABLET | Freq: Two times a day (BID) | ORAL | Status: DC
  Administered 2022-05-06 – 2022-05-09 (×8): 550 mg via ORAL
  Filled 2022-05-06 (×8): qty 1

## 2022-05-06 MED ORDER — LACTULOSE 10 GM/15ML PO SOLN
30.0000 g | Freq: Three times a day (TID) | ORAL | Status: DC
  Administered 2022-05-06 – 2022-05-08 (×7): 30 g via ORAL
  Filled 2022-05-06 (×6): qty 45

## 2022-05-06 NOTE — Progress Notes (Signed)
PROGRESS NOTE        PATIENT DETAILS Name: Peter Nichols Age: 76 y.o. Sex: male Date of Birth: [birth year] Admit Date: 04/25/2022 Admitting Physician Elmarie Shiley, MD TMA:[id number], Caren Griffins, MD  Brief Summary: Patient is a 76 y.o.  male with history of liver cirrhosis-presented with decompensated cirrhosis with ascites, hepatic hydrothorax, encephalopathy and deconditioning.  He was evaluated by [facility] and IR-underwent paracentesis on 7/2, thoracocentesis on 7/6-ultimately had TIPS procedure on 7/7.  Unfortunately-he developed severe hepatic encephalopathy s/p TIPS placement and underwent TIPS revision with constrained meant.  He was followed closely by palliative care-on 7/18-family agreed to transition to comfort measures, however on 7/19-patient was awake/alert-family elected to DNR but wanted full scope of treatment otherwise.  See below for further details.     Significant events: 7/1>> admit to [facility]-weakness/confusion-decompensated liver cirrhosis. 7/7>> TIPS 7/15>> TIPS revision 7/18>> transition to comfort care by palliative care 7/19>> more awake/alert-family revoked comfort care but remains DNR.  Full scope of treatment.  Significant studies: 7/1>> CXR: No PNA. 7/3>> CT abdomen/pelvis: No acute finding, umbilical/left mesenteric portosystemic venous collaterals.  Moderate ascites.  Bilateral pleural effusions.  Significant microbiology data: 7/2>>blood cultures NGTD. 7/2>>peritoneal fluid culture NGTD.  Procedures: 7/2>> paracentesis-3 L removed 7/6>> thoracocentesis-1 L removed 7/7>> TIPS procedure by IR 7/15>> TIPS revision by IR 7/15>> paracentesis-1.95 L removed  Consults: GI, IR, palliative care  Subjective: Chronically sick appearing-but awake and alert.  Objective: Vitals: Blood pressure (!) 103/48, pulse 81, temperature 97.6 F (36.4 C), temperature source Axillary, resp. rate 18, height 5' 9"  (1.753 m), weight (P) 74.2 kg,  SpO2 97 %.   Exam: Gen Exam:Alert awake-not in any distress HEENT:atraumatic, normocephalic Chest: Diminished air entry at bases.  But otherwise clear. CVS:S1S2 regular Abdomen: Soft-but distended. Extremities:no edema Neurology: Non focal Skin: no rash  Pertinent Labs/Radiology:    Latest Ref Rng & Units 05/04/2022    3:29 AM 05/02/2022    1:24 AM 05/01/2022    5:44 AM  CBC  WBC 4.0 - 10.5 K/uL 6.9  8.1  11.0   Hemoglobin 13.0 - 17.0 g/dL 9.3  10.2  9.9   Hematocrit 39.0 - 52.0 % 27.2  29.8  28.4   Platelets 150 - 400 K/uL 87  76  72     Lab Results  Component Value Date   NA 134 (L) 05/05/2022   K 4.4 05/05/2022   CL 109 05/05/2022   CO2 17 (L) 05/05/2022      Assessment/Plan: Decompensated liver cirrhosis with hepatic hydrothorax and recurrent ascites-s/p TIPS on 7/7 Severe hepatic encephalopathy following TIPS-s/p revision of TIPS on 7/15 Awake/alert-appears to have some ascites on exam-family revoked comfort care status but remains a DNR-to continue full scope of treatment-resume lactulose/rifaximin.  Restart midodrine-and see what his blood pressure does before reinitiating diuretic regimen.  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  Follow.  BPH: Resume Flomax-monitor for urinary retention.  Palliative care: Although family revoked comfort care status on 7/19-DNR/DNI remains in place-although he is awake/alert today-he is very frail and debilitated-reviewed prior notes-he is not a candidate for liver transplantation.  Suspect his overall prognosis is still poor-palliative care will continue to engage family and provide further recommendations.   Physical deconditioning: Evaluated by PT/OT during the earlier part of the hospital stay-recommendations  were for SNF.  Will await further recommendations from palliative care regarding appropriate disposition.  Nutrition Status: Nutrition  Problem: Severe Malnutrition Etiology: chronic illness (hepatic cirrhosis) Signs/Symptoms: severe muscle depletion, severe fat depletion Interventions: [employer] Breakfast, MVI  BMI: Estimated body mass index is 24.16 kg/m (pended) as calculated from the following:   Height as of this encounter: 5' 9"  (1.753 m).   Weight as of this encounter: (P) 74.2 kg.   Code status:   Code Status: DNR   DVT Prophylaxis: Place and maintain sequential compression device Start: 05/03/22 1116   Family Communication: Spouse and daughter  at bedside   Disposition Plan: Status is: Inpatient Remains inpatient appropriate because: Resolving encephalopathy-see above documentation-not yet stable for discharge.  Comfort care status revoked on 7/19-needs optimization of medication regimen while inpatient before consideration of discharge.   Planned Discharge Destination:Skilled nursing facility   Diet: Diet Order             Diet 2 gram sodium Room service appropriate? Yes; Fluid consistency: Thin  Diet effective now                     Antimicrobial agents: Anti-infectives (From admission, onward)    Start     Dose/Rate Route Frequency Ordered Stop   05/06/22 1545  rifaximin (XIFAXAN) tablet 550 mg        550 mg Oral 2 times daily 05/06/22 1448     05/02/22 0943  ceFAZolin (ANCEF) IVPB 2g/100 mL premix        over 30 Minutes Intravenous Continuous PRN 05/02/22 0944 05/02/22 0943   04/28/22 1000  rifaximin (XIFAXAN) tablet 550 mg  Status:  Discontinued        550 mg Oral 2 times daily 04/28/22 0741 05/05/22 1244   04/19/22 1000  rifaximin (XIFAXAN) tablet 550 mg  Status:  Discontinued        550 mg Oral 2 times daily 05/15/2022 2043 04/26/22 1750   05/10/2022 1715  cefTRIAXone (ROCEPHIN) 2 g in sodium chloride 0.9 % 100 mL IVPB  Status:  Discontinued        2 g 200 mL/hr over 30 Minutes Intravenous Every 24 hours 05/01/2022 1712 04/25/22 1514        MEDICATIONS: Scheduled  Meds:  lactulose  30 g Oral TID   midodrine  10 mg Oral TID WC   pantoprazole (PROTONIX) IV  40 mg Intravenous Q12H   rifaximin  550 mg Oral BID   tamsulosin  0.4 mg Oral Daily   Continuous Infusions:  sodium chloride Stopped (04/23/2022 1507)   PRN Meds:.acetaminophen **OR** acetaminophen, albuterol, glycopyrrolate **OR** glycopyrrolate **OR** glycopyrrolate, guaiFENesin, LORazepam, morphine injection, [DISCONTINUED] ondansetron **OR** ondansetron (ZOFRAN) IV, mouth rinse, polyvinyl alcohol   I have personally reviewed following labs and imaging studies  LABORATORY DATA: CBC: Recent Labs  Lab 04/30/22 0439 05/01/22 0544 05/02/22 0124 05/04/22 0329  WBC 8.0 11.0* 8.1 6.9  HGB 10.3* 9.9* 10.2* 9.3*  HCT 30.1* 28.4* 29.8* 27.2*  MCV 101.0* 100.0 101.4* 101.1*  PLT 80* 72* 76* 87*    Basic Metabolic Panel: Recent Labs  Lab 04/30/22 0439 05/01/22 0544 05/02/22 0124 05/04/22 0329 05/05/22 0625  NA 138 137 136 136 134*  K 4.3 4.0 4.4 3.6 4.4  CL 108 107 110 110 109  CO2 22 21* 19* 22 17*  GLUCOSE 123* 125* 118* 126* 134*  BUN 11 13 13 15 19   CREATININE 0.91 0.92 0.72 0.94 1.13  CALCIUM 9.2 9.1  9.1 8.9 8.8*  MG 1.9  --  2.0  --  2.1  PHOS 3.1  --   --   --   --     GFR: Estimated Creatinine Clearance: 56.5 mL/min (by C-G formula based on SCr of 1.13 mg/dL).  Liver Function Tests: Recent Labs  Lab 04/30/22 0439 05/01/22 0544 05/02/22 0124 05/05/22 0625  AST 59* 59* 64* 60*  ALT 45* 46* 45* 40  ALKPHOS 132* 127* 130* 121  BILITOT 7.7* 8.8* 9.8* 9.5*  PROT 4.6* 4.2* 4.5* 4.4*  ALBUMIN 2.7* 2.7* 2.8* 2.6*   No results for input(s): "LIPASE", "AMYLASE" in the last 168 hours. Recent Labs  Lab 05/01/22 0544 05/02/22 0124 05/03/22 0041 05/04/22 0329 05/05/22 0625  AMMONIA 119* 124* 92* 100* 116*    Coagulation Profile: Recent Labs  Lab 04/30/22 0439 05/01/22 0544 05/02/22 0124  INR 2.3* 2.4* 2.3*    Cardiac Enzymes: No results for input(s):  "CKTOTAL", "CKMB", "CKMBINDEX", "TROPONINI" in the last 168 hours.  BNP (last 3 results) No results for input(s): "PROBNP" in the last 8760 hours.  Lipid Profile: No results for input(s): "CHOL", "HDL", "LDLCALC", "TRIG", "CHOLHDL", "LDLDIRECT" in the last 72 hours.  Thyroid Function Tests: No results for input(s): "TSH", "T4TOTAL", "FREET4", "T3FREE", "THYROIDAB" in the last 72 hours.  Anemia Panel: No results for input(s): "VITAMINB12", "FOLATE", "FERRITIN", "TIBC", "IRON", "RETICCTPCT" in the last 72 hours.  Urine analysis:    Component Value Date/Time   COLORURINE AMBER (A) 04/19/2022 0405   APPEARANCEUR CLEAR 04/19/2022 0405   LABSPEC 1.019 04/19/2022 0405   PHURINE 5.0 04/19/2022 0405   GLUCOSEU NEGATIVE 04/19/2022 0405   HGBUR NEGATIVE 04/19/2022 0405   BILIRUBINUR NEGATIVE 04/19/2022 0405   KETONESUR NEGATIVE 04/19/2022 0405   PROTEINUR NEGATIVE 04/19/2022 0405   UROBILINOGEN 1.0 11/27/2007 1029   NITRITE NEGATIVE 04/19/2022 0405   LEUKOCYTESUR NEGATIVE 04/19/2022 0405    Sepsis Labs: Lactic Acid, Venous    Component Value Date/Time   LATICACIDVEN 2.9 ([place]) 05/01/2022 0544    MICROBIOLOGY: No results found for this or any previous visit (from the past 240 hour(s)).  RADIOLOGY STUDIES/RESULTS: DG Chest Port 1 View  Result Date: 05/05/2022 CLINICAL DATA:  Generalized weakness, difficulty breathing EXAM: PORTABLE CHEST 1 VIEW COMPARISON:  Previous studies including the examination of 05/01/2022 FINDINGS: There is improvement in aeration of right upper lung field. There is haziness in the right mid and both lower lung fields. This may be partly due to layering of pleural effusions. Evaluation of lower lung fields for infiltrates is limited by the effusions. There is no pneumothorax. IMPRESSION: Small to moderate bilateral pleural effusions, more so on the right side. There is interval improvement in aeration of right lung, possibly suggesting decrease in right pleural  effusion. Electronically Signed   By: Elmer Picker M.D.   On: 05/05/2022 08:25     LOS: 17 days   Oren Binet, MD  [facility]    To contact the attending provider between 7A-7P or the covering provider during after hours 7P-7A, please log into the web site [email or web address] and access using universal [facility] password for that web site. If you do not have the password, please call the hospital operator.  05/06/2022, 2:48 PM

## 2022-05-06 NOTE — Progress Notes (Addendum)
Supervising Physician: Ruthann Cancer  Patient Status:  Peter [facility] - In-pt  Chief Complaint: S/p paracentesis, TIPS creation, and ectopic variceal coil embolization on 7/7 performed by Dr. Serafina Royals. Patient underwent portal venogram with TIPS revision and paracentesis 05/02/22 with Dr. Maryelizabeth Kaufmann   Subjective: Patient in bed resting with eyes closed. Wife and daughters at the bedside. Patient able to answer all questions/participate in conversation.   Allergies: Ensure  Medications: Prior to Admission medications   Medication Sig Start Date End Date Taking? Authorizing Provider  b complex vitamins capsule Take 1 capsule by mouth daily.   Yes [provider]  calcium carbonate (TUMS - DOSED IN MG ELEMENTAL CALCIUM) 500 MG chewable tablet Chew 1 tablet by mouth daily as needed for indigestion or heartburn.   Yes [provider]  fluticasone (FLONASE) 50 MCG/ACT nasal spray Place 1-2 sprays into both nostrils daily as needed for allergies or rhinitis.   Yes [provider]  furosemide (LASIX) 40 MG tablet Take 40 mg by mouth daily. 01/09/22  Yes [provider]  lactulose (CHRONULAC) 10 GM/15ML solution Take 45 mLs (30 g total) by mouth 3 (three) times daily. Patient taking differently: Take 30 g by mouth 2 (two) times daily. 01/02/22  Yes Allie Bossier, MD  Multiple Vitamins-Minerals (MULTIVITAMIN MEN 50+) TABS Take 1 tablet by mouth daily with breakfast.   Yes [provider]  pantoprazole (PROTONIX) 40 MG tablet Take 40 mg by mouth daily before breakfast.   Yes [provider]  pravastatin (PRAVACHOL) 40 MG tablet Take 40 mg by mouth daily.   Yes [provider]  Protein POWD Take 1 Scoop by mouth See admin instructions. Unnamed WHEY-FREE protein powder: Mix 1 scoopful of powder into the appropriate amount of liquid and drink once a day as needed for nutritional support   Yes [provider]  rifaximin (XIFAXAN) 550 MG TABS  tablet Take 1 tablet (550 mg total) by mouth 2 (two) times daily. 01/02/22  Yes Allie Bossier, MD  spironolactone (ALDACTONE) 50 MG tablet Take 150 mg by mouth daily. 01/09/22  Yes [provider]  feeding supplement (ENSURE ENLIVE / ENSURE PLUS) LIQD Take 237 mLs by mouth daily. Patient not taking: Reported on 04/30/2022 01/02/22   Allie Bossier, MD  Multiple Vitamin (MULTIVITAMIN WITH MINERALS) TABS tablet Take 1 tablet by mouth daily. Patient not taking: Reported on 04/22/2022 05/25/21   Annita Brod, MD  Nutritional Supplements (,FEEDING SUPPLEMENT, PROSOURCE PLUS) liquid Take 30 mLs by mouth 2 (two) times daily between meals. Patient not taking: Reported on 05/03/2022 01/02/22   Allie Bossier, MD  pravastatin (PRAVACHOL) 20 MG tablet Take 3 tablets (60 mg total) by mouth daily. Patient not taking: Reported on 05/09/2022 01/03/22   Allie Bossier, MD  propranolol (INDERAL) 10 MG tablet Take 1 tablet (10 mg total) by mouth 2 (two) times daily. Patient not taking: Reported on 04/25/2022 06/03/21   Elgergawy, Silver Huguenin, MD     Vital Signs: BP (!) 103/48 (BP Location: Right Arm)   Pulse 81   Temp 97.6 F (36.4 C) (Axillary)   Resp 18   Ht 5' 9"  (1.753 m)   Wt (P) 163 lb 9.3 oz (74.2 kg)   SpO2 97%   BMI (P) 24.16 kg/m   Physical Exam Constitutional:      Appearance: He is ill-appearing.  Pulmonary:     Effort: Pulmonary effort is normal.  Abdominal:     General:  There is distension.     Tenderness: There is no abdominal tenderness.  Skin:    Coloration: Skin is jaundiced.  Neurological:     Mental Status: He is oriented to person, place, and time.     Imaging: DG Chest Port 1 View  Result Date: 05/05/2022 CLINICAL DATA:  Generalized weakness, difficulty breathing EXAM: PORTABLE CHEST 1 VIEW COMPARISON:  Previous studies including the examination of 05/01/2022 FINDINGS: There is improvement in aeration of right upper lung field. There is haziness in the right mid and  both lower lung fields. This may be partly due to layering of pleural effusions. Evaluation of lower lung fields for infiltrates is limited by the effusions. There is no pneumothorax. IMPRESSION: Small to moderate bilateral pleural effusions, more so on the right side. There is interval improvement in aeration of right lung, possibly suggesting decrease in right pleural effusion. Electronically Signed   By: Elmer Picker M.D.   On: 05/05/2022 08:25    Labs:  CBC: Recent Labs    04/30/22 0439 05/01/22 0544 05/02/22 0124 05/04/22 0329  WBC 8.0 11.0* 8.1 6.9  HGB 10.3* 9.9* 10.2* 9.3*  HCT 30.1* 28.4* 29.8* 27.2*  PLT 80* 72* 76* 87*    COAGS: Recent Labs    05/27/21 1634 11/07/21 1241 12/10/21 1245 12/23/21 1208 03/17/22 1147 05/12/2022 1755 04/25/22 0119 04/30/22 0439 05/01/22 0544 05/02/22 0124  INR 1.5*   < > 1.6* 1.7* 1.7*   < > 2.2* 2.3* 2.4* 2.3*  APTT 31  --  35 34 33  --   --   --   --   --    < > = values in this interval not displayed.    BMP: Recent Labs    05/01/22 0544 05/02/22 0124 05/04/22 0329 05/05/22 0625  NA 137 136 136 134*  K 4.0 4.4 3.6 4.4  CL 107 110 110 109  CO2 21* 19* 22 17*  GLUCOSE 125* 118* 126* 134*  BUN 13 13 15 19   CALCIUM 9.1 9.1 8.9 8.8*  CREATININE 0.92 0.72 0.94 1.13  GFRNONAA >60 >60 >60 >60    LIVER FUNCTION TESTS: Recent Labs    04/30/22 0439 05/01/22 0544 05/02/22 0124 05/05/22 0625  BILITOT 7.7* 8.8* 9.8* 9.5*  AST 59* 59* 64* 60*  ALT 45* 46* 45* 40  ALKPHOS 132* 127* 130* 121  PROT 4.6* 4.2* 4.5* 4.4*  ALBUMIN 2.7* 2.7* 2.8* 2.6*    Assessment and Plan:  S/p paracentesis, TIPS creation, and ectopic variceal coil embolization on 7/7 performed by Dr. Serafina Royals. Patient underwent portal venogram with TIPS revision and paracentesis 05/02/22 with Dr. Maryelizabeth Kaufmann   No new labs today. He is hypotensive with a BP of 103/48 this morning. Family states his mental status waxes and wanes. During my visit he was able to  answer questions appropriately and he denied any pain or discomfort. He had a good bowel movement yesterday but  hasn't had one today yet. His appetite has been decent.   Peter [facility] has been transitioned to [place] and while the family is realistic they are also hopeful he will recover enough to leave the [facility] and go to SNF or rehab. They are aware there are no further plans from IR at this time but we remain available as needed.   Electronically Signed: Soyla Dryer, AGACNP-BC [phone] 05/06/2022, 4:06 PM   I spent a total of 15 Minutes at the the patient's bedside AND on the patient's [facility] floor or unit, greater  than 50% of which was counseling/coordinating care for TIPS/TIPS revision

## 2022-05-06 NOTE — TOC Progression Note (Signed)
Transition of Care [facility]) - Progression Note    Patient Details  Name: VANCE HOCHMUTH MRN: [id number] Date of Birth: 01/11/1946  Transition of Care [facility]) CM/SW [place], LCSW Phone Number: 05/06/2022, 3:39 PM  Clinical Narrative:    CSW continuing to follow. [facility] still unable to accept patients at this time.    Expected Discharge Plan: [facility] Barriers to Discharge: Continued Medical Work up  Expected Discharge Plan and Services Expected Discharge Plan: [place]   Discharge Planning Services: CM Consult   Living arrangements for the past 2 months: Single Family Home                                       Social Determinants of Health (SDOH) Interventions    Readmission Risk Interventions     No data to display

## 2022-05-06 NOTE — Progress Notes (Signed)
Daily Progress Note   Patient Name: Peter Nichols       Date: 05/06/2022 DOB: [birth year]  Age: 76 y.o. MRN#: [id number] Attending Physician: Jonetta Osgood, MD Primary Care Physician: [name], MD Admit Date: 04/29/2022  Reason for Consultation/Follow-up: Establishing goals of care  Patient Profile/HPI:   76 y.o. male  with past medical history of neuromuscular disorder, alcoholic cirrhosis with recurrent ascites, hepatic encephalopathy, hepatic hydrothorax, anemia and thrombocytopenia  admitted on 04/27/2022 with decompensated alcoholic hepatic cirrhosis with cystitis, hepatic hydrothorax, acute hepatic encephalopathy and physical deconditioning. He underwent paracentesis with removal of 3 L on 7/2. Right thoracocentesis with removal of 1 L on 7/6.He had TIPS placed on 7/7 by IR.  Hospital course complicated by ileus and ongoing hepatic encephalopathy.  PMT consulted to discuss goals of care.  Subjective: Chart reviewed including labs imaging and progress notes.  Patient is awake and alert, but falls asleep easily. Family tells me he has been asking to get up into the chair. He had a good night last night- visited with family, told stories. His oral intake is improving.  Family shares that this morning patient said he didn't feel as though he was on his last leg and he did not want to be comfort care only. Patient agrees.  Rush [place] stated his main goal of care was to try and get back to sleeping in his own bed, and to go fishing again.  We discussed that he will likely need a stay at a nursing facility and rehab and he wishes to do this.  Family expressed they are aware that his status is tenuous, and that this may be a temporary rally- but they want to honor his wishes.    Review of Systems   Constitutional:  Positive for malaise/fatigue.  Gastrointestinal:  Negative for abdominal pain.  Musculoskeletal:  Negative for myalgias.     Physical Exam Vitals and nursing note reviewed.  Constitutional:      Appearance: He is ill-appearing.     Comments: Frail, cachectic  Pulmonary:     Effort: Pulmonary effort is normal.     Comments: cough Abdominal:     General: There is distension.  Skin:    Coloration: Skin is jaundiced.     Comments: Scattered petechiae  Neurological:     Comments: somnolent  Vital Signs: BP (!) 103/48 (BP Location: Right Arm)   Pulse 81   Temp 97.6 F (36.4 C) (Axillary)   Resp 18   Ht 5' 9"  (1.753 m)   Wt (P) 74.2 kg   SpO2 97%   BMI (P) 24.16 kg/m  SpO2: SpO2: 97 % O2 Device: O2 Device: Room Air O2 Flow Rate: O2 Flow Rate (L/min): 2 L/min  Intake/output summary:  Intake/Output Summary (Last 24 hours) at 05/06/2022 1102 Last data filed at 05/05/2022 1206 Gross per 24 hour  Intake 600 ml  Output --  Net 600 ml    LBM: Last BM Date : 05/05/22 Baseline Weight: Weight: 71.2 kg Most recent weight: Weight: (P) 74.2 kg       Palliative Assessment/Data: PPS: 20%      Patient Active Problem List   Diagnosis Date Noted   Protein-calorie malnutrition, severe 05/05/2022   Goals of care, counseling/discussion 04/30/2022   DNR (do not resuscitate) 04/30/2022   Lactic acidosis 05/04/2022   Generalized weakness 04/20/2022   Decompensated hepatic cirrhosis (HCC)    GERD (gastroesophageal reflux disease)    Ascites of liver 01/01/2022   Anemia, unspecified 01/01/2022   Hyperglycemia 01/01/2022   Hepatic encephalopathy ([place]) 12/31/2021   Pancreatitis, acute 06/01/2021   AKI (acute kidney injury) ([place]) 05/27/2021   Near syncope 05/27/2021   Steatohepatitis, non-alcoholic    Hyperlipidemia    Overweight (BMI 25.0-29.9)    Alcoholic cirrhosis of liver with ascites ([place])    Acute hepatic encephalopathy ([place]) 05/22/2021    Thrombocytopenia ([place]) 05/04/2017    Palliative Care Assessment & Plan    Assessment/Recommendations/Plan  Transition off of comfort measures only Attending provider notified PMT will continue to follow  Code Status: DNR  Prognosis:  Unable to determine   Discharge Planning: To Be Determined  Care plan was discussed with patient family and care team.   Thank you for allowing the Palliative Medicine Team to assist in the care of this patient.  Greater than 50%  of this time was spent counseling and coordinating care related to the above assessment and plan.  Mariana Kaufman, AGNP-C Palliative Medicine   Please contact Palliative Medicine Team phone at [phone] for questions and concerns.

## 2022-05-07 DIAGNOSIS — Z7189 Other specified counseling: Secondary | ICD-10-CM | POA: Diagnosis not present

## 2022-05-07 DIAGNOSIS — K7290 Hepatic failure, unspecified without coma: Secondary | ICD-10-CM | POA: Diagnosis not present

## 2022-05-07 DIAGNOSIS — K7460 Unspecified cirrhosis of liver: Secondary | ICD-10-CM | POA: Diagnosis not present

## 2022-05-07 DIAGNOSIS — K7031 Alcoholic cirrhosis of liver with ascites: Secondary | ICD-10-CM | POA: Diagnosis not present

## 2022-05-07 DIAGNOSIS — K7682 Hepatic encephalopathy: Secondary | ICD-10-CM | POA: Diagnosis not present

## 2022-05-07 LAB — COMPREHENSIVE METABOLIC PANEL
ALT: 45 U/L — ABNORMAL HIGH (ref 0–44)
AST: 54 U/L — ABNORMAL HIGH (ref 15–41)
Albumin: 2.9 g/dL — ABNORMAL LOW (ref 3.5–5.0)
Alkaline Phosphatase: 113 U/L (ref 38–126)
Anion gap: 9 (ref 5–15)
BUN: 34 mg/dL — ABNORMAL HIGH (ref 8–23)
CO2: 20 mmol/L — ABNORMAL LOW (ref 22–32)
Calcium: 9.1 mg/dL (ref 8.9–10.3)
Chloride: 104 mmol/L (ref 98–111)
Creatinine, Ser: 1.01 mg/dL (ref 0.61–1.24)
GFR, Estimated: >60 mL/min (ref >60)
Glucose, Bld: 149 mg/dL — ABNORMAL HIGH (ref 70–99)
Potassium: 4.5 mmol/L (ref 3.5–5.1)
Sodium: 133 mmol/L — ABNORMAL LOW (ref 135–145)
Total Bilirubin: 7.3 mg/dL — ABNORMAL HIGH (ref 0.3–1.2)
Total Protein: 4.8 g/dL — ABNORMAL LOW (ref 6.5–8.1)

## 2022-05-07 MED ORDER — SPIRONOLACTONE 25 MG PO TABS
50.0000 mg | ORAL_TABLET | Freq: Every day | ORAL | Status: DC
  Administered 2022-05-07 – 2022-05-08 (×2): 50 mg via ORAL
  Filled 2022-05-07 (×2): qty 2

## 2022-05-07 MED ORDER — FUROSEMIDE 20 MG PO TABS
20.0000 mg | ORAL_TABLET | Freq: Every day | ORAL | Status: DC
  Administered 2022-05-07 – 2022-05-09 (×3): 20 mg via ORAL
  Filled 2022-05-07 (×3): qty 1

## 2022-05-07 NOTE — Progress Notes (Signed)
PROGRESS NOTE        PATIENT DETAILS Name: Peter Nichols Age: 76 y.o. Sex: male Date of Birth: 03-10-1946 Admit Date: 04/20/2022 Admitting Physician Elmarie Shiley, MD [id number], Caren Griffins, MD  Brief Summary: Patient is a 76 y.o.  male with history of liver cirrhosis-presented with decompensated cirrhosis with ascites, hepatic hydrothorax, encephalopathy and deconditioning.  He was evaluated by [facility] and IR-underwent paracentesis on 7/2, thoracocentesis on 7/6-ultimately had TIPS procedure on 7/7.  Unfortunately-he developed severe hepatic encephalopathy s/p TIPS placement and underwent TIPS revision with constrained meant.  He was followed closely by palliative care-on 7/18-family agreed to transition to comfort measures, however on 7/19-patient was awake/alert-family elected to DNR but wanted full scope of treatment otherwise.  See below for further details.     Significant events: 7/1>> admit to [facility]-weakness/confusion-decompensated liver cirrhosis. 7/7>> TIPS 7/15>> TIPS revision 7/18>> transition to comfort care by palliative care 7/19>> more awake/alert-family revoked comfort care but remains DNR.  Full scope of treatment.  Significant studies: 7/1>> CXR: No PNA. 7/3>> CT abdomen/pelvis: No acute finding, umbilical/left mesenteric portosystemic venous collaterals.  Moderate ascites.  Bilateral pleural effusions.  Significant microbiology data: 7/2>>blood cultures NGTD. 7/2>>peritoneal fluid culture NGTD.  Procedures: 7/2>> paracentesis-3 L removed 7/6>> thoracocentesis-1 L removed 7/7>> TIPS procedure by IR 7/15>> TIPS revision by IR 7/15>> paracentesis-1.95 L removed  Consults: GI, IR, palliative care  Subjective: Slightly lethargic today but still awake/alert.  Objective: Vitals: Blood pressure (!) 110/51, pulse 86, temperature (!) 97.4 F (36.3 C), temperature source Oral, resp. rate 18, height 5' 9"  (1.753 m), weight (P) 74.2 kg,  SpO2 92 %.   Exam: Gen Exam:Alert awake-not in any distress HEENT:atraumatic, normocephalic Chest: B/L clear to auscultation anteriorly CVS:S1S2 regular Abdomen:soft non tender, distended with some dullness in the flanks. Extremities:no edema Neurology: Non focal Skin: no rash   Pertinent Labs/Radiology:    Latest Ref Rng & Units 05/04/2022    3:29 AM 05/02/2022    1:24 AM 05/01/2022    5:44 AM  CBC  WBC 4.0 - 10.5 K/uL 6.9  8.1  11.0   Hemoglobin 13.0 - 17.0 g/dL 9.3  10.2  9.9   Hematocrit 39.0 - 52.0 % 27.2  29.8  28.4   Platelets 150 - 400 K/uL 87  76  72     Lab Results  Component Value Date   NA 133 (L) 05/07/2022   K 4.5 05/07/2022   CL 104 05/07/2022   CO2 20 (L) 05/07/2022      Assessment/Plan: Decompensated liver cirrhosis with hepatic hydrothorax and recurrent ascites-s/p TIPS on 7/7 Severe hepatic encephalopathy following TIPS-s/p revision of TIPS on 7/15 Slightly lethargic but still pretty much awake and alert.  Continue rifaximin/lactulose-if lethargy worsens-May need lactulose dosage adjusted further.  BP relatively stable on midodrine-we will start low-dose diuretics and adjust accordingly.  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  Follow.  BPH: Continue Flomax-watch for urinary obstruction.  Palliative care: Family revoked comfort care status-but he remains a DNR.  Long discussion with spouse/daughter at bedside-all understand that his prognosis still is pretty poor.  Plan at this point is to try and stabilize him with medications-and get him to rehab.  If he were to deteriorate significantly-we will again have goals of care/end-of-life discussion.    Physical  deconditioning: Since no longer comfort care-getting PT/OT reevaluation.  Suspect will require SNF on discharge.  Nutrition Status: Nutrition Problem: Severe Malnutrition Etiology: chronic illness (hepatic  cirrhosis) Signs/Symptoms: severe muscle depletion, severe fat depletion Interventions: [employer] Breakfast, MVI  BMI: Estimated body mass index is 24.16 kg/m (pended) as calculated from the following:   Height as of this encounter: 5' 9"  (1.753 m).   Weight as of this encounter: (P) 74.2 kg.   Code status:   Code Status: DNR   DVT Prophylaxis: Place and maintain sequential compression device Start: 05/03/22 1116   Family Communication: Spouse and daughter  at bedside   Disposition Plan: Status is: Inpatient Remains inpatient appropriate because: Resolving encephalopathy-see above documentation-not yet stable for discharge.  Comfort care status revoked on 7/19-needs optimization of medication regimen while inpatient before consideration of discharge.   Planned Discharge Destination:Skilled nursing facility   Diet: Diet Order             Diet 2 gram sodium Room service appropriate? Yes; Fluid consistency: Thin  Diet effective now                     Antimicrobial agents: Anti-infectives (From admission, onward)    Start     Dose/Rate Route Frequency Ordered Stop   05/06/22 1545  rifaximin (XIFAXAN) tablet 550 mg        550 mg Oral 2 times daily 05/06/22 1448     05/02/22 0943  ceFAZolin (ANCEF) IVPB 2g/100 mL premix        over 30 Minutes Intravenous Continuous PRN 05/02/22 0944 05/02/22 0943   04/28/22 1000  rifaximin (XIFAXAN) tablet 550 mg  Status:  Discontinued        550 mg Oral 2 times daily 04/28/22 0741 05/05/22 1244   04/19/22 1000  rifaximin (XIFAXAN) tablet 550 mg  Status:  Discontinued        550 mg Oral 2 times daily 05/15/2022 2043 04/26/22 1750   04/28/2022 1715  cefTRIAXone (ROCEPHIN) 2 g in sodium chloride 0.9 % 100 mL IVPB  Status:  Discontinued        2 g 200 mL/hr over 30 Minutes Intravenous Every 24 hours 04/20/2022 1712 04/25/22 1514        MEDICATIONS: Scheduled Meds:  lactulose  30 g Oral TID   midodrine  10 mg Oral TID WC    pantoprazole (PROTONIX) IV  40 mg Intravenous Q12H   rifaximin  550 mg Oral BID   tamsulosin  0.4 mg Oral Daily   Continuous Infusions:  sodium chloride Stopped (05/04/2022 1507)   PRN Meds:.acetaminophen **OR** acetaminophen, albuterol, glycopyrrolate **OR** glycopyrrolate **OR** glycopyrrolate, guaiFENesin, LORazepam, morphine injection, [DISCONTINUED] ondansetron **OR** ondansetron (ZOFRAN) IV, mouth rinse, polyvinyl alcohol   I have personally reviewed following labs and imaging studies  LABORATORY DATA: CBC: Recent Labs  Lab 05/01/22 0544 05/02/22 0124 05/04/22 0329  WBC 11.0* 8.1 6.9  HGB 9.9* 10.2* 9.3*  HCT 28.4* 29.8* 27.2*  MCV 100.0 101.4* 101.1*  PLT 72* 76* 87*     Basic Metabolic Panel: Recent Labs  Lab 05/01/22 0544 05/02/22 0124 05/04/22 0329 05/05/22 0625 05/07/22 0920  NA 137 136 136 134* 133*  K 4.0 4.4 3.6 4.4 4.5  CL 107 110 110 109 104  CO2 21* 19* 22 17* 20*  GLUCOSE 125* 118* 126* 134* 149*  BUN 13 13 15 19  34*  CREATININE 0.92 0.72 0.94 1.13 1.01  CALCIUM 9.1 9.1 8.9 8.8* 9.1  MG  --  2.0  --  2.1  --      GFR: Estimated Creatinine Clearance: 63.2 mL/min (by C-G formula based on SCr of 1.01 mg/dL).  Liver Function Tests: Recent Labs  Lab 05/01/22 0544 05/02/22 0124 05/05/22 0625 05/07/22 0920  AST 59* 64* 60* 54*  ALT 46* 45* 40 45*  ALKPHOS 127* 130* 121 113  BILITOT 8.8* 9.8* 9.5* 7.3*  PROT 4.2* 4.5* 4.4* 4.8*  ALBUMIN 2.7* 2.8* 2.6* 2.9*    No results for input(s): "LIPASE", "AMYLASE" in the last 168 hours. Recent Labs  Lab 05/01/22 0544 05/02/22 0124 05/03/22 0041 05/04/22 0329 05/05/22 0625  AMMONIA 119* 124* 92* 100* 116*     Coagulation Profile: Recent Labs  Lab 05/01/22 0544 05/02/22 0124  INR 2.4* 2.3*     Cardiac Enzymes: No results for input(s): "CKTOTAL", "CKMB", "CKMBINDEX", "TROPONINI" in the last 168 hours.  BNP (last 3 results) No results for input(s): "PROBNP" in the last 8760  hours.  Lipid Profile: No results for input(s): "CHOL", "HDL", "LDLCALC", "TRIG", "CHOLHDL", "LDLDIRECT" in the last 72 hours.  Thyroid Function Tests: No results for input(s): "TSH", "T4TOTAL", "FREET4", "T3FREE", "THYROIDAB" in the last 72 hours.  Anemia Panel: No results for input(s): "VITAMINB12", "FOLATE", "FERRITIN", "TIBC", "IRON", "RETICCTPCT" in the last 72 hours.  Urine analysis:    Component Value Date/Time   COLORURINE AMBER (A) 04/19/2022 0405   APPEARANCEUR CLEAR 04/19/2022 0405   LABSPEC 1.019 04/19/2022 0405   PHURINE 5.0 04/19/2022 0405   GLUCOSEU NEGATIVE 04/19/2022 0405   HGBUR NEGATIVE 04/19/2022 0405   BILIRUBINUR NEGATIVE 04/19/2022 0405   KETONESUR NEGATIVE 04/19/2022 0405   PROTEINUR NEGATIVE 04/19/2022 0405   UROBILINOGEN 1.0 11/27/2007 1029   NITRITE NEGATIVE 04/19/2022 0405   LEUKOCYTESUR NEGATIVE 04/19/2022 0405    Sepsis Labs: Lactic Acid, Venous    Component Value Date/Time   LATICACIDVEN 2.9 ([place]) 05/01/2022 0544    MICROBIOLOGY: No results found for this or any previous visit (from the past 240 hour(s)).  RADIOLOGY STUDIES/RESULTS: No results found.   LOS: 18 days   Oren Binet, MD  [facility]    To contact the attending provider between 7A-7P or the covering provider during after hours 7P-7A, please log into the web site [email or web address] and access using universal [place] password for that web site. If you do not have the password, please call the hospital operator.  05/07/2022, 1:12 PM

## 2022-05-07 NOTE — Progress Notes (Signed)
Physical Therapy Treatment Patient Details Name: SALADIN PETRELLI MRN: [id number] DOB: 15-Jun-1946 Today's Date: 05/07/2022   History of Present Illness LASEAN GORNIAK is a 76 y.o. male with a past medical history significant for HTN, chronic thrombocytopenia, GERD, decompensated alcoholic cirrhosis, portal hypertension, hepatic encephalopathy, hepatic hydrothorax, recurrent ascites, large left retroperitoneal varix who presented to [facility] ED on 7/1 with complaints of weakness, confusion, persistent hypotension with diuretic use and required initiation of levophed. He underwent paracentesis with removal of 3 L on 7/2. Right thoracocentesis with removal of 1 L on 7/6.  [facility] consulted on 7/3.  He had TIPS placed on 7/7 by IR.  Hospital course complicated by ileus requiring NG tube. 7/15-TIPS revision with constrainment and therapeutic paracentesis with removal of 1.95 L.    PT Comments    Patient up in chair on arrival. Agreed to PT session to work on strengthening and balance. Able to progress to standing with RW with +2 mod assist, standing for >60 seconds each time and progressing to stepping in place (max of 10 steps). Patient/family agree to continued rehab at [facility].     Recommendations for follow up therapy are one component of a multi-disciplinary discharge planning process, led by the attending physician.  Recommendations may be updated based on patient status, additional functional criteria and insurance authorization.  Follow Up Recommendations  Skilled nursing-short term rehab (<3 hours/day) Can patient physically be transported by private vehicle: No   Assistance Recommended at Discharge Frequent or constant Supervision/Assistance  Patient can return home with the following Two people to help with walking and/or transfers;Two people to help with bathing/dressing/bathroom;Assistance with cooking/housework;Assistance with feeding;Assist for transportation;Help with stairs or ramp for  entrance   Equipment Recommendations  Other (comment) (defer to next venue of care)    Recommendations for Other Services       Precautions / Restrictions Precautions Precautions: Fall Precaution Comments: BP runs low, on lactulose Restrictions Weight Bearing Restrictions: No     Mobility  Bed Mobility               General bed mobility comments: up in chair ~30 minutes prior to arrival (per family)    Transfers Overall transfer level: Needs assistance Equipment used: Rolling walker (2 wheels) Transfers: Sit to/from Stand Sit to Stand: Mod assist, +2 physical assistance           General transfer comment: assistance needed to power into standing from recliner x 2 reps,    Ambulation/Gait Ambulation/Gait assistance: Min assist   Assistive device: Rolling walker (2 wheels)       Pre-gait activities: stepping in place x 6 steps, seated rest, x 10 steps (alternating)     Stairs             Wheelchair Mobility    Modified Rankin (Stroke Patients Only)       Balance Overall balance assessment: Needs assistance Sitting-balance support: Feet supported Sitting balance-Leahy Scale: Fair     Standing balance support: During functional activity, Bilateral upper extremity supported, Reliant on assistive device for balance Standing balance-Leahy Scale: Poor                              Cognition Arousal/Alertness: Awake/alert Behavior During Therapy: Flat affect Overall Cognitive Status: Impaired/Different from baseline Area of Impairment: Attention, Memory, Following commands, Safety/judgement, Awareness, Problem solving  Current Attention Level: Sustained Memory: Decreased short-term memory Following Commands: Follows one step commands inconsistently, Follows one step commands with increased time Safety/Judgement: Decreased awareness of deficits Awareness: Intellectual Problem Solving: Slow processing,  Decreased initiation, Difficulty sequencing, Requires verbal cues, Requires tactile cues General Comments: some difficulty with following instructions for LE exercises and cues for hand placement with RW/Chair        Exercises General Exercises - Lower Extremity Ankle Circles/Pumps: AROM, Both, 10 reps Quad Sets: AROM, Both, 10 reps    General Comments General comments (skin integrity, edema, etc.): Family present and supportive during session.      Pertinent Vitals/Pain Pain Assessment Pain Assessment: No/denies pain Faces Pain Scale: No hurt Breathing: normal Negative Vocalization: none Facial Expression: smiling or inexpressive Body Language: relaxed Consolability: no need to console PAINAD Score: 0    Home Living                          Prior Function            PT Goals (current goals can now be found in the care plan section) Acute Rehab PT Goals Patient Stated Goal: go home PT Goal Formulation: With patient Time For Goal Achievement: 05/21/22 Potential to Achieve Goals: Fair Progress towards PT goals: Progressing toward goals (goals updated based on timeframe)    Frequency    Min 2X/week      PT Plan Current plan remains appropriate    Co-evaluation              AM-PAC PT "6 Clicks" Mobility   Outcome Measure  Help needed turning from your back to your side while in a flat bed without using bedrails?: A Lot Help needed moving from lying on your back to sitting on the side of a flat bed without using bedrails?: Total Help needed moving to and from a bed to a chair (including a wheelchair)?: Total Help needed standing up from a chair using your arms (e.g., wheelchair or bedside chair)?: Total Help needed to walk in hospital room?: Total Help needed climbing 3-5 steps with a railing? : Total 6 Click Score: 7    End of Session Equipment Utilized During Treatment: Gait belt Activity Tolerance: Patient limited by fatigue Patient  left: in chair;with call bell/phone within reach;with family/visitor present Nurse Communication: Mobility status PT Visit Diagnosis: Other abnormalities of gait and mobility (R26.89)     Time: 3704-8889 PT Time Calculation (min) (ACUTE ONLY): 26 min  Charges:  $Gait Training: 8-22 mins $Therapeutic Exercise: 8-22 mins                      Arby Barrette, PT Acute Rehabilitation Services  Office [phone]    Rexanne Mano 05/07/2022, 4:04 PM

## 2022-05-07 NOTE — Progress Notes (Signed)
Updated SNF offers provided to pt's dtrs and wife at bedside. Pt's family to research facilities and update SW re choice. Pt's dtr reports pt seems to be declining again today and they are not sure if they will pursue SNF or hospice. Reviewed hospice services in the home, hospice home placement, and SNF with hospice services. Agreed to f/u with family tomorrow.   Wandra Feinstein, MSW, LCSW [phone] (coverage)

## 2022-05-07 NOTE — Progress Notes (Signed)
Daily Progress Note   Patient Name: Peter Nichols       Date: 05/07/2022 DOB: Oct 24, 1945  Age: 76 y.o. MRN#: [id number] Attending Physician: Jonetta Osgood, MD Primary Care Physician: [name], MD Admit Date: 05/13/2022  Reason for Consultation/Follow-up: Establishing goals of care  Patient Profile/HPI:   76 y.o. male  with past medical history of neuromuscular disorder, alcoholic cirrhosis with recurrent ascites, hepatic encephalopathy, hepatic hydrothorax, anemia and thrombocytopenia  admitted on 05/10/2022 with decompensated alcoholic hepatic cirrhosis with cystitis, hepatic hydrothorax, acute hepatic encephalopathy and physical deconditioning. He underwent paracentesis with removal of 3 L on 7/2. Right thoracocentesis with removal of 1 L on 7/6.He had TIPS placed on 7/7 by IR.  Hospital course complicated by ileus and ongoing hepatic encephalopathy.  PMT consulted to discuss goals of care.  Subjective: Chart reviewed including labs imaging and progress notes.  Family at bedside.  They report he ate all of his breakfast today.  He got up and went around in the unit in a wheelchair yesterday and greatly enjoyed it.  He sat up in the chair at the bedside for most of the evening.  They are very pleased with his progress and wish to continue full scope care and are hoping to have him discharged to a nursing facility. He was sleeping on my evaluation. I discussed with family the fact that he is doing well now, but the need to prepare for when he declines again in the future.  We discussed the necessity of knowing what bills bottom-line for quality of life would be. His wife shared that they had discussed this, and he had expressed that if he was having multiple hospitalizations and was not  responding to therapy then he would be ready for more comfort focused care and transitioning to end-of-life.  However, at this time and through their discussions yesterday and last night he wishes to continue his current level of care and this includes going to rehab in hopes of eventually returning home.  Review of Systems  Constitutional:  Positive for malaise/fatigue.  Gastrointestinal:  Negative for abdominal pain.  Musculoskeletal:  Negative for myalgias.     Physical Exam Vitals and nursing note reviewed.  Constitutional:      Appearance: He is ill-appearing.     Comments: Frail, cachectic  Pulmonary:  Effort: Pulmonary effort is normal.  Abdominal:     General: There is distension.  Skin:    Coloration: Skin is jaundiced.     Comments: Scattered petechiae  Neurological:     Comments: sleeping             Vital Signs: BP (!) 110/51 (BP Location: Left Arm)   Pulse 86   Temp (!) 97.4 F (36.3 C) (Oral)   Resp 18   Ht 5' 9"  (1.753 m)   Wt (P) 74.2 kg   SpO2 92%   BMI (P) 24.16 kg/m  SpO2: SpO2: 92 % O2 Device: O2 Device: Room Air O2 Flow Rate: O2 Flow Rate (L/min): 2 L/min  Intake/output summary:  Intake/Output Summary (Last 24 hours) at 05/07/2022 1352 Last data filed at 05/07/2022 3500 Gross per 24 hour  Intake --  Output 700 ml  Net -700 ml    LBM: Last BM Date : 05/05/22 Baseline Weight: Weight: 71.2 kg Most recent weight: Weight: (P) 74.2 kg       Palliative Assessment/Data: PPS: 20%      Patient Active Problem List   Diagnosis Date Noted   Protein-calorie malnutrition, severe 05/05/2022   Goals of care, counseling/discussion 04/30/2022   DNR (do not resuscitate) 04/30/2022   Lactic acidosis 05/03/2022   Generalized weakness 05/08/2022   Decompensated hepatic cirrhosis (HCC)    GERD (gastroesophageal reflux disease)    Ascites of liver 01/01/2022   Anemia, unspecified 01/01/2022   Hyperglycemia 01/01/2022   Hepatic encephalopathy ([place])  12/31/2021   Pancreatitis, acute 06/01/2021   AKI (acute kidney injury) ([place]) 05/27/2021   Near syncope 05/27/2021   Steatohepatitis, non-alcoholic    Hyperlipidemia    Overweight (BMI 25.0-29.9)    Alcoholic cirrhosis of liver with ascites ([place])    Acute hepatic encephalopathy ([place]) 05/22/2021   Thrombocytopenia ([place]) 05/04/2017    Palliative Care Assessment & Plan    Assessment/Recommendations/Plan  Continue full scope care- limits set at DNR, no feeding tube Plan for d/c to SNF/rehab with final goal of returning home Refer for outpatient Palliative to follow at SNF and home PMT will follow peripherally and see if patient's status changes- family also has contact info and encouraged to call if needs arise  Code Status: DNR  Prognosis:  Unable to determine   Discharge Planning: To Be Determined  Care plan was discussed with patient family and care team.   Thank you for allowing the Palliative Medicine Team to assist in the care of this patient.  Greater than 50%  of this time was spent counseling and coordinating care related to the above assessment and plan.  Mariana Kaufman, AGNP-C Palliative Medicine   Please contact Palliative Medicine Team phone at [phone] for questions and concerns.

## 2022-05-08 DIAGNOSIS — Z7189 Other specified counseling: Secondary | ICD-10-CM | POA: Diagnosis not present

## 2022-05-08 DIAGNOSIS — K7682 Hepatic encephalopathy: Secondary | ICD-10-CM | POA: Diagnosis not present

## 2022-05-08 DIAGNOSIS — K7290 Hepatic failure, unspecified without coma: Secondary | ICD-10-CM | POA: Diagnosis not present

## 2022-05-08 DIAGNOSIS — K7031 Alcoholic cirrhosis of liver with ascites: Secondary | ICD-10-CM | POA: Diagnosis not present

## 2022-05-08 LAB — BASIC METABOLIC PANEL
Anion gap: 6 (ref 5–15)
BUN: 27 mg/dL — ABNORMAL HIGH (ref 8–23)
CO2: 21 mmol/L — ABNORMAL LOW (ref 22–32)
Calcium: 9.4 mg/dL (ref 8.9–10.3)
Chloride: 107 mmol/L (ref 98–111)
Creatinine, Ser: 1 mg/dL (ref 0.61–1.24)
GFR, Estimated: >60 mL/min (ref >60)
Glucose, Bld: 106 mg/dL — ABNORMAL HIGH (ref 70–99)
Potassium: 4.8 mmol/L (ref 3.5–5.1)
Sodium: 134 mmol/L — ABNORMAL LOW (ref 135–145)

## 2022-05-08 MED ORDER — LACTULOSE ENEMA
300.0000 mL | Freq: Once | RECTAL | Status: AC
Start: 2022-05-08 — End: 2022-05-08
  Administered 2022-05-08: 300 mL via RECTAL
  Filled 2022-05-08: qty 300

## 2022-05-08 MED ORDER — LACTULOSE 10 GM/15ML PO SOLN
30.0000 g | Freq: Four times a day (QID) | ORAL | Status: DC
Start: 2022-05-08 — End: 2022-05-10
  Administered 2022-05-08 – 2022-05-09 (×7): 30 g via ORAL
  Filled 2022-05-08 (×7): qty 45

## 2022-05-08 NOTE — Progress Notes (Signed)
Spoke to pt's wife re SNF choice and she has accepted offer from [facility]. Spoke to [place] with [facility] who reports they are able to accept pt next week pending bed availability. Will ask weekend SW to start auth on Sunday.   Wandra Feinstein, MSW, LCSW [phone] (coverage)

## 2022-05-08 NOTE — Progress Notes (Signed)
PROGRESS NOTE        PATIENT DETAILS Name: Peter Nichols Age: 76 y.o. Sex: male Date of Birth: 05/14/1946 Admit Date: 05/04/2022 Admitting Physician Elmarie Shiley, MD ACZ:YSAYT, Caren Griffins, MD  Brief Summary: Patient is a 75 y.o.  male with history of liver cirrhosis-presented with decompensated cirrhosis with ascites, hepatic hydrothorax, encephalopathy and deconditioning.  He was evaluated by [facility] and IR-underwent paracentesis on 7/2, thoracocentesis on 7/6-ultimately had TIPS procedure on 7/7.  Unfortunately-he developed severe hepatic encephalopathy s/p TIPS placement and underwent TIPS revision with constrained meant.  He was followed closely by palliative care-on 7/18-family agreed to transition to comfort measures, however on 7/19-patient was awake/alert-family elected to DNR but wanted full scope of treatment otherwise.  See below for further details.     Significant events: 7/1>> admit to [facility]-weakness/confusion-decompensated liver cirrhosis. 7/7>> TIPS 7/15>> TIPS revision 7/18>> transition to comfort care by palliative care 7/19>> more awake/alert-family revoked comfort care but remains DNR.  Full scope of treatment.  Significant studies: 7/1>> CXR: No PNA. 7/3>> CT abdomen/pelvis: No acute finding, umbilical/left mesenteric portosystemic venous collaterals.  Moderate ascites.  Bilateral pleural effusions.  Significant microbiology data: 7/2>>blood cultures NGTD. 7/2>>peritoneal fluid culture NGTD.  Procedures: 7/2>> paracentesis-3 L removed 7/6>> thoracocentesis-1 L removed 7/7>> TIPS procedure by IR 7/15>> TIPS revision by IR 7/15>> paracentesis-1.95 L removed  Consults: GI, IR, palliative care  Subjective: More lethargic-family reports only one small BM earlier this morning.   Objective: Vitals: Blood pressure (!) 106/48, pulse 82, temperature 97.6 F (36.4 C), temperature source Oral, resp. rate 18, height 5' 9"  (1.753 m),  weight (P) 74.2 kg, SpO2 97 %.   Exam: Gen Exam: Remains more lethargic than yesterday-but not in any distress. HEENT:atraumatic, normocephalic Chest: B/L clear to auscultation anteriorly CVS:S1S2 regular Abdomen: Soft-abdomen is distended. Extremities:no edema Neurology: Non focal Skin: no rash   Pertinent Labs/Radiology:    Latest Ref Rng & Units 05/04/2022    3:29 AM 05/02/2022    1:24 AM 05/01/2022    5:44 AM  CBC  WBC 4.0 - 10.5 K/uL 6.9  8.1  11.0   Hemoglobin 13.0 - 17.0 g/dL 9.3  10.2  9.9   Hematocrit 39.0 - 52.0 % 27.2  29.8  28.4   Platelets 150 - 400 K/uL 87  76  72     Lab Results  Component Value Date   NA 134 (L) 05/08/2022   K 4.8 05/08/2022   CL 107 05/08/2022   CO2 21 (L) 05/08/2022      Assessment/Plan: Decompensated liver cirrhosis with hepatic hydrothorax and recurrent ascites-s/p TIPS on 7/7 Severe hepatic encephalopathy following TIPS-s/p revision of TIPS on 7/15 More lethargic-had only 1 or so bowel movement yesterday, 1 small BM today-give 1 dose of lactulose enema-increase oral lactulose to 4 times a day.  Continue rifaximin, midodrine and low-dose diuretics.  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  Follow.  BPH: Continue Flomax-watch for urinary obstruction.  Palliative care: Family revoked comfort care status-but he remains a DNR.  Long discussion with daughter today-explained that there is really not much more apart from medication adjustment that we can do-family understands poor prognosis-they are thinking of getting him to SNF with hospice follow-up.  We will try to optimize his lactulose regimen-however if encephalopathy deteriorates-we will  need to talk with family again regarding comfort measures.  Physical deconditioning: Evaluated by PT/OT-recommendations of SNF.  Nutrition Status: Nutrition Problem: Severe Malnutrition Etiology: chronic illness  (hepatic cirrhosis) Signs/Symptoms: severe muscle depletion, severe fat depletion Interventions: [employer] Breakfast, MVI  BMI: Estimated body mass index is 24.16 kg/m (pended) as calculated from the following:   Height as of this encounter: 5' 9"  (1.753 m).   Weight as of this encounter: (P) 74.2 kg.   Code status:   Code Status: DNR   DVT Prophylaxis: Place and maintain sequential compression device Start: 05/03/22 1116   Family Communication:Daughter  at bedside   Disposition Plan: Status is: Inpatient Remains inpatient appropriate because: Resolving encephalopathy-see above documentation-not yet stable for discharge.  Comfort care status revoked on 7/19-needs optimization of medication regimen while inpatient before consideration of discharge.   Planned Discharge Destination:Skilled nursing facility with hospice follow-up.   Diet: Diet Order             Diet 2 gram sodium Room service appropriate? Yes; Fluid consistency: Thin  Diet effective now                     Antimicrobial agents: Anti-infectives (From admission, onward)    Start     Dose/Rate Route Frequency Ordered Stop   05/06/22 1545  rifaximin (XIFAXAN) tablet 550 mg        550 mg Oral 2 times daily 05/06/22 1448     05/02/22 0943  ceFAZolin (ANCEF) IVPB 2g/100 mL premix        over 30 Minutes Intravenous Continuous PRN 05/02/22 0944 05/02/22 0943   04/28/22 1000  rifaximin (XIFAXAN) tablet 550 mg  Status:  Discontinued        550 mg Oral 2 times daily 04/28/22 0741 05/05/22 1244   04/19/22 1000  rifaximin (XIFAXAN) tablet 550 mg  Status:  Discontinued        550 mg Oral 2 times daily 05/14/2022 2043 04/26/22 1750   05/07/2022 1715  cefTRIAXone (ROCEPHIN) 2 g in sodium chloride 0.9 % 100 mL IVPB  Status:  Discontinued        2 g 200 mL/hr over 30 Minutes Intravenous Every 24 hours 05/10/2022 1712 04/25/22 1514        MEDICATIONS: Scheduled Meds:  furosemide  20 mg Oral Daily   lactulose   30 g Oral QID   lactulose  300 mL Rectal Once   midodrine  10 mg Oral TID WC   pantoprazole (PROTONIX) IV  40 mg Intravenous Q12H   rifaximin  550 mg Oral BID   spironolactone  50 mg Oral Daily   tamsulosin  0.4 mg Oral Daily   Continuous Infusions:  sodium chloride Stopped (04/25/2022 1507)   PRN Meds:.acetaminophen **OR** acetaminophen, albuterol, glycopyrrolate **OR** glycopyrrolate **OR** glycopyrrolate, guaiFENesin, LORazepam, morphine injection, [DISCONTINUED] ondansetron **OR** ondansetron (ZOFRAN) IV, mouth rinse, polyvinyl alcohol   I have personally reviewed following labs and imaging studies  LABORATORY DATA: CBC: Recent Labs  Lab 05/02/22 0124 05/04/22 0329  WBC 8.1 6.9  HGB 10.2* 9.3*  HCT 29.8* 27.2*  MCV 101.4* 101.1*  PLT 76* 87*     Basic Metabolic Panel: Recent Labs  Lab 05/02/22 0124 05/04/22 0329 05/05/22 0625 05/07/22 0920 05/08/22 0442  NA 136 136 134* 133* 134*  K 4.4 3.6 4.4 4.5 4.8  CL 110 110 109 104 107  CO2 19* 22 17* 20* 21*  GLUCOSE 118* 126* 134* 149* 106*  BUN 13 15  19 34* 27*  CREATININE 0.72 0.94 1.13 1.01 1.00  CALCIUM 9.1 8.9 8.8* 9.1 9.4  MG 2.0  --  2.1  --   --      GFR: Estimated Creatinine Clearance: 63.8 mL/min (by C-G formula based on SCr of 1 mg/dL).  Liver Function Tests: Recent Labs  Lab 05/02/22 0124 05/05/22 0625 05/07/22 0920  AST 64* 60* 54*  ALT 45* 40 45*  ALKPHOS 130* 121 113  BILITOT 9.8* 9.5* 7.3*  PROT 4.5* 4.4* 4.8*  ALBUMIN 2.8* 2.6* 2.9*    No results for input(s): "LIPASE", "AMYLASE" in the last 168 hours. Recent Labs  Lab 05/02/22 0124 05/03/22 0041 05/04/22 0329 05/05/22 0625  AMMONIA 124* 92* 100* 116*     Coagulation Profile: Recent Labs  Lab 05/02/22 0124  INR 2.3*     Cardiac Enzymes: No results for input(s): "CKTOTAL", "CKMB", "CKMBINDEX", "TROPONINI" in the last 168 hours.  BNP (last 3 results) No results for input(s): "PROBNP" in the last 8760 hours.  Lipid  Profile: No results for input(s): "CHOL", "HDL", "LDLCALC", "TRIG", "CHOLHDL", "LDLDIRECT" in the last 72 hours.  Thyroid Function Tests: No results for input(s): "TSH", "T4TOTAL", "FREET4", "T3FREE", "THYROIDAB" in the last 72 hours.  Anemia Panel: No results for input(s): "VITAMINB12", "FOLATE", "FERRITIN", "TIBC", "IRON", "RETICCTPCT" in the last 72 hours.  Urine analysis:    Component Value Date/Time   COLORURINE AMBER (A) 04/19/2022 0405   APPEARANCEUR CLEAR 04/19/2022 0405   LABSPEC 1.019 04/19/2022 0405   PHURINE 5.0 04/19/2022 0405   GLUCOSEU NEGATIVE 04/19/2022 0405   HGBUR NEGATIVE 04/19/2022 0405   BILIRUBINUR NEGATIVE 04/19/2022 0405   KETONESUR NEGATIVE 04/19/2022 0405   PROTEINUR NEGATIVE 04/19/2022 0405   UROBILINOGEN 1.0 11/27/2007 1029   NITRITE NEGATIVE 04/19/2022 0405   LEUKOCYTESUR NEGATIVE 04/19/2022 0405    Sepsis Labs: Lactic Acid, Venous    Component Value Date/Time   LATICACIDVEN 2.9 ([place]) 05/01/2022 0544    MICROBIOLOGY: No results found for this or any previous visit (from the past 240 hour(s)).  RADIOLOGY STUDIES/RESULTS: No results found.   LOS: 19 days   Oren Binet, MD  [facility]    To contact the attending provider between 7A-7P or the covering provider during after hours 7P-7A, please log into the web site [email or web address] and access using universal [place] password for that web site. If you do not have the password, please call the hospital operator.  05/08/2022, 11:02 AM

## 2022-05-08 NOTE — Progress Notes (Signed)
Daily Progress Note   Patient Name: Peter Nichols       Date: 05/08/2022 DOB: [birth year]  Age: 76 y.o. MRN#: [id number] Attending Physician: Jonetta Osgood, MD Primary Care Physician: [name], MD Admit Date: 05/13/2022  Reason for Consultation/Follow-up: Establishing goals of care  Patient Profile/HPI:   76 y.o. male  with past medical history of neuromuscular disorder, alcoholic cirrhosis with recurrent ascites, hepatic encephalopathy, hepatic hydrothorax, anemia and thrombocytopenia  admitted on 04/26/2022 with decompensated alcoholic hepatic cirrhosis with cystitis, hepatic hydrothorax, acute hepatic encephalopathy and physical deconditioning. He underwent paracentesis with removal of 3 L on 7/2. Right thoracocentesis with removal of 1 L on 7/6.He had TIPS placed on 7/7 by IR.  Hospital course complicated by ileus and ongoing hepatic encephalopathy.  PMT consulted to discuss goals of care.  Subjective: Chart reviewed including labs imaging and progress notes.  Family at bedside.   Discussed his plan of care.  He has been offered a bed at [facility] and they have accepted this.  Their hope is that his time at [facility] will give them time to arrange supplemental care at home.  They wish for him to eventually discharge home with hospice. We discussed what his wishes would be in the event that he goes to rehab and declines.  They noted in that case they would wish to go ahead and transition to comfort and try to discharge him home.  They would want him transferred to the hospital if it was needed to obtain his comfort.  Review of Systems  Constitutional:  Positive for malaise/fatigue.  Gastrointestinal:  Negative for abdominal pain.  Musculoskeletal:  Negative for myalgias.      Physical Exam Vitals and nursing note reviewed.  Constitutional:      Appearance: He is ill-appearing.     Comments: Frail, cachectic  Pulmonary:     Effort: Pulmonary effort is normal.  Skin:    Coloration: Skin is jaundiced.     Comments: Scattered petechiae  Neurological:     Comments: sleeping             Vital Signs: BP (!) 106/48 (BP Location: Left Arm)   Pulse 82   Temp 97.6 F (36.4 C) (Oral)   Resp 18   Ht 5' 9"  (1.753 m)   Wt (P)  74.2 kg   SpO2 97%   BMI (P) 24.16 kg/m  SpO2: SpO2: 97 % O2 Device: O2 Device: Room Air O2 Flow Rate: O2 Flow Rate (L/min): 2 L/min  Intake/output summary:  Intake/Output Summary (Last 24 hours) at 05/08/2022 1448 Last data filed at 05/08/2022 0530 Gross per 24 hour  Intake --  Output 1550 ml  Net -1550 ml    LBM: Last BM Date : 05/08/22 Baseline Weight: Weight: 71.2 kg Most recent weight: Weight: (P) 74.2 kg       Palliative Assessment/Data: PPS: 40%      Patient Active Problem List   Diagnosis Date Noted   Protein-calorie malnutrition, severe 05/05/2022   Goals of care, counseling/discussion 04/30/2022   DNR (do not resuscitate) 04/30/2022   Lactic acidosis 04/26/2022   Generalized weakness 04/21/2022   Decompensated hepatic cirrhosis (HCC)    GERD (gastroesophageal reflux disease)    Ascites of liver 01/01/2022   Anemia, unspecified 01/01/2022   Hyperglycemia 01/01/2022   Hepatic encephalopathy ([place]) 12/31/2021   Pancreatitis, acute 06/01/2021   AKI (acute kidney injury) ([place]) 05/27/2021   Near syncope 05/27/2021   Steatohepatitis, non-alcoholic    Hyperlipidemia    Overweight (BMI 25.0-29.9)    Alcoholic cirrhosis of liver with ascites ([place])    Acute hepatic encephalopathy ([place]) 05/22/2021   Thrombocytopenia ([place]) 05/04/2017    Palliative Care Assessment & Plan    Assessment/Recommendations/Plan  Continue full scope care- limits set at DNR, no feeding tube Plan for d/c to SNF/rehab with final  goal of returning home with hospice Refer for outpatient Palliative to follow at SNF and assist with eventual transition to hospice If he declines at rehab and requires return hospitalization-their choice would be for comfort measures only or they would try to transfer him directly with hospice PMT will follow peripherally and see if patient's status changes- family also has contact info and encouraged to call if needs arise  Code Status: DNR  Prognosis:  Unable to determine   Discharge Planning: To Be Determined  Care plan was discussed with patient family and care team.   Thank you for allowing the Palliative Medicine Team to assist in the care of this patient.  Greater than 50%  of this time was spent counseling and coordinating care related to the above assessment and plan.  Mariana Kaufman, AGNP-C Palliative Medicine   Please contact Palliative Medicine Team phone at [phone] for questions and concerns.

## 2022-05-08 NOTE — Plan of Care (Signed)
  Problem: Activity: Goal: Risk for activity intolerance will decrease Outcome: Progressing   Problem: Nutrition: Goal: Adequate nutrition will be maintained Outcome: Progressing   Problem: Safety: Goal: Ability to remain free from injury will improve Outcome: Progressing

## 2022-05-08 NOTE — Progress Notes (Addendum)
Request received from Palliative APP for referral to outpt palliative to follow pt at SNF. Spoke to pt's wife who is agreeable to palliative services and is requesting [facility], [place]. Referral made to [facility] with [facility]. SW will follow.   Wandra Feinstein, MSW, LCSW [phone] (coverage)

## 2022-05-08 NOTE — Progress Notes (Signed)
[facility]) Hospital Liaison note:  Notified by [facility] Josie of request for [facility] services. Will continue to follow for disposition.  Please call with any outpatient palliative questions or concerns.  Thank you for the opportunity to participate in this patient's care.  Thank you, Lorelee Market, LPN [facility] Liaison [phone]

## 2022-05-08 NOTE — Plan of Care (Signed)

## 2022-05-08 NOTE — Progress Notes (Signed)
Occupational Therapy Treatment Patient Details Name: Peter Nichols MRN: [id number] DOB: [birth year] Today's Date: 05/08/2022   History of present illness Peter Nichols is a 76 y.o. male with a past medical history significant for HTN, chronic thrombocytopenia, GERD, decompensated alcoholic cirrhosis, portal hypertension, hepatic encephalopathy, hepatic hydrothorax, recurrent ascites, large left retroperitoneal varix who presented to [facility] ED on 7/1 with complaints of weakness, confusion, persistent hypotension with diuretic use and required initiation of levophed. He underwent paracentesis with removal of 3 L on 7/2. Right thoracocentesis with removal of 1 L on 7/6.  [facility] consulted on 7/3.  He had TIPS placed on 7/7 by IR.  Hospital course complicated by ileus requiring NG tube. 7/15-TIPS revision with constrainment and therapeutic paracentesis with removal of 1.95 L.   OT comments  Patient progressing and showed improved bed mobility with assist of 1 person for supine<>sit compared to previous session when pt required 2nd person assist.  Pt able to tolerate 17 min EOB sitting with fair sitting balance and able to use BUEs to participate in EOB grooming with Min As.  Patient remains limited by cognitive deficits, somnolence,  severe generalized weakness and decreased activity tolerance along with deficits noted below. Pt continues to demonstrate good rehab potential and would benefit from continued skilled OT to increase safety and independence with ADLs and functional transfers to allow pt to return home safely and reduce caregiver burden and fall risk.    Recommendations for follow up therapy are one component of a multi-disciplinary discharge planning process, led by the attending physician.  Recommendations may be updated based on patient status, additional functional criteria and insurance authorization.    Follow Up Recommendations  Skilled nursing-short term rehab (<3 hours/day)     Assistance Recommended at Discharge Frequent or constant Supervision/Assistance  Patient can return home with the following  Two people to help with walking and/or transfers;Two people to help with bathing/dressing/bathroom;Assistance with cooking/housework;Direct supervision/assist for medications management;Direct supervision/assist for financial management;Assistance with feeding;Assist for transportation;Help with stairs or ramp for entrance   Equipment Recommendations   (defer to post-acute recommendations)    Recommendations for Other Services      Precautions / Restrictions Precautions Precautions: Fall Precaution Comments: BP runs low, on lactulose Restrictions Weight Bearing Restrictions: No       Mobility Bed Mobility Overal bed mobility: Needs Assistance Bed Mobility: Supine to Sit, Sit to Supine     Supine to sit: Mod assist, HOB elevated Sit to supine: Mod assist   General bed mobility comments: Pt attempted latearl scooting along EOB but unable to Total Assist.  RN assisted with posterior scoot to [facility] with pt supine and pt able to assist with pushing LEs.    Transfers                         Balance Overall balance assessment: Needs assistance Sitting-balance support: Feet supported Sitting balance-Leahy Scale: Fair                                     ADL either performed or assessed with clinical judgement   ADL Overall ADL's : Needs assistance/impaired     Grooming: Sitting;Wash/dry face;Oral care;Minimal assistance;Moderate assistance Grooming Details (indicate cue type and reason): Pt sat EOB and able to maintain sitting balance with use of BUEs to wash face with Mod As for thoroughness and perform oral  care with Min As. Pt tolerated sitting EOB for 8mn with fair static sitting balance, progressing from BUE support on bed to no UE support.                       Toileting - Clothing Manipulation Details (indicate  cue type and reason): Using external catheter            Extremity/Trunk Assessment Upper Extremity Assessment Upper Extremity Assessment: Generalized weakness   Lower Extremity Assessment Lower Extremity Assessment: Defer to PT evaluation   Cervical / Trunk Assessment Cervical / Trunk Assessment: Kyphotic    Vision Baseline Vision/History: 1 Wears glasses Ability to See in Adequate Light: 0 Adequate Additional Comments: Keeps eye half mast to closed frequently   Perception     Praxis      Cognition Arousal/Alertness: Lethargic Behavior During Therapy: Flat affect Overall Cognitive Status: Impaired/Different from baseline Area of Impairment: Attention, Memory, Following commands, Safety/judgement, Awareness, Problem solving, Orientation                 Orientation Level: Time (Notyear. Dtr reports baseline) Current Attention Level: Sustained Memory: Decreased short-term memory Following Commands: Follows one step commands inconsistently, Follows one step commands with increased time       General Comments: Explained that OT was going to assist pt to sit up EOB. Pt agreeable. Mid-way through supine to sit pt stopped and asked "Whoa, whoa, what are we doing?". This occurred about 2 more times and pt required reminders of task at hand.        Exercises      Shoulder Instructions       General Comments      Pertinent Vitals/ Pain       Pain Assessment Pain Assessment: No/denies pain Faces Pain Scale: No hurt  Home Living                                          Prior Functioning/Environment              Frequency  Min 2X/week        Progress Toward Goals  OT Goals(current goals can now be found in the care plan section)  Progress towards OT goals: Progressing toward goals  Acute Rehab OT Goals OT Goal Formulation: With patient/family Time For Goal Achievement: 05/18/22 Potential to Achieve Goals: Good  Plan  Discharge plan remains appropriate    Co-evaluation                 AM-PAC OT "6 Clicks" Daily Activity     Outcome Measure   Help from another person eating meals?: A Lot Help from another person taking care of personal grooming?: A Little Help from another person toileting, which includes using toliet, bedpan, or urinal?: Total Help from another person bathing (including washing, rinsing, drying)?: Total Help from another person to put on and taking off regular upper body clothing?: A Lot Help from another person to put on and taking off regular lower body clothing?: Total 6 Click Score: 10    End of Session Equipment Utilized During Treatment: Gait belt  OT Visit Diagnosis: Unsteadiness on feet (R26.81);Muscle weakness (generalized) (M62.81);Cognitive communication deficit (R41.841)   Activity Tolerance Patient tolerated treatment well   Patient Left in bed;with call bell/phone within reach;with nursing/sitter in room;with family/visitor present   Nurse Communication Other (  comment) (RN assisted with spine scooting.)        Time: [phone] OT Time Calculation (min): 27 min  Charges: OT General Charges $OT Visit: 1 Visit OT Treatments $Self Care/Home Management : 8-22 mins $Therapeutic Activity: 8-22 mins  Anderson [place], [place] Office: [phone] 05/08/2022  Julien Girt 05/08/2022, 10:11 AM

## 2022-05-09 DIAGNOSIS — Z7189 Other specified counseling: Secondary | ICD-10-CM | POA: Diagnosis not present

## 2022-05-09 DIAGNOSIS — K7290 Hepatic failure, unspecified without coma: Secondary | ICD-10-CM | POA: Diagnosis not present

## 2022-05-09 DIAGNOSIS — K7682 Hepatic encephalopathy: Secondary | ICD-10-CM | POA: Diagnosis not present

## 2022-05-09 DIAGNOSIS — K7031 Alcoholic cirrhosis of liver with ascites: Secondary | ICD-10-CM | POA: Diagnosis not present

## 2022-05-09 LAB — AMMONIA: Ammonia: 76 umol/L — ABNORMAL HIGH (ref 9–35)

## 2022-05-09 LAB — BASIC METABOLIC PANEL WITH GFR
Anion gap: 9 (ref 5–15)
BUN: 22 mg/dL (ref 8–23)
CO2: 20 mmol/L — ABNORMAL LOW (ref 22–32)
Calcium: 9.4 mg/dL (ref 8.9–10.3)
Chloride: 105 mmol/L (ref 98–111)
Creatinine, Ser: 0.99 mg/dL (ref 0.61–1.24)
GFR, Estimated: >60 mL/min
Glucose, Bld: 156 mg/dL — ABNORMAL HIGH (ref 70–99)
Potassium: 4.4 mmol/L (ref 3.5–5.1)
Sodium: 134 mmol/L — ABNORMAL LOW (ref 135–145)

## 2022-05-09 MED ORDER — SPIRONOLACTONE 25 MG PO TABS
100.0000 mg | ORAL_TABLET | Freq: Every day | ORAL | Status: DC
  Administered 2022-05-09: 100 mg via ORAL
  Filled 2022-05-09: qty 4

## 2022-05-09 MED ORDER — PANTOPRAZOLE SODIUM 40 MG PO TBEC
40.0000 mg | DELAYED_RELEASE_TABLET | Freq: Two times a day (BID) | ORAL | Status: DC
  Administered 2022-05-09: 40 mg via ORAL
  Filled 2022-05-09: qty 1

## 2022-05-09 NOTE — Progress Notes (Signed)
PROGRESS NOTE        PATIENT DETAILS Name: Peter Nichols Age: 76 y.o. Sex: male Date of Birth: [birth year] Admit Date: 04/30/2022 Admitting Physician Elmarie Shiley, MD GOT:[id number], Caren Griffins, MD  Brief Summary: Patient is a 76 y.o.  male with history of liver cirrhosis-presented with decompensated cirrhosis with ascites, hepatic hydrothorax, encephalopathy and deconditioning.  He was evaluated by [facility] and IR-underwent paracentesis on 7/2, thoracocentesis on 7/6-ultimately had TIPS procedure on 7/7.  Unfortunately-he developed severe hepatic encephalopathy s/p TIPS placement and underwent TIPS revision with constrained meant.  He was followed closely by palliative care-on 7/18-family agreed to transition to comfort measures, however on 7/19-patient was awake/alert-family elected to DNR but wanted full scope of treatment otherwise.  See below for further details.     Significant events: 7/1>> admit to [facility]-weakness/confusion-decompensated liver cirrhosis. 7/7>> TIPS 7/15>> TIPS revision 7/18>> transition to comfort care by palliative care 7/19>> more awake/alert-family revoked comfort care but remains DNR.  Full scope of treatment.  Significant studies: 7/1>> CXR: No PNA. 7/3>> CT abdomen/pelvis: No acute finding, umbilical/left mesenteric portosystemic venous collaterals.  Moderate ascites.  Bilateral pleural effusions.  Significant microbiology data: 7/2>>blood cultures NGTD. 7/2>>peritoneal fluid culture NGTD.  Procedures: 7/2>> paracentesis-3 L removed 7/6>> thoracocentesis-1 L removed 7/7>> TIPS procedure by IR 7/15>> TIPS revision by IR 7/15>> paracentesis-1.95 L removed  Consults: GI, IR, palliative care  Subjective: More lethargic-family reports only one small BM earlier this morning.   Objective: Vitals: Blood pressure (!) 113/33, pulse 94, temperature (!) 97.5 F (36.4 C), temperature source Oral, resp. rate 20, height 5' 9"  (1.753 m),  weight (P) 74.2 kg, SpO2 97 %.   Exam: Gen Exam: Remains more lethargic than yesterday-but not in any distress. HEENT:atraumatic, normocephalic Chest: B/L clear to auscultation anteriorly CVS:S1S2 regular Abdomen: Soft-abdomen is distended. Extremities:no edema Neurology: Non focal Skin: no rash   Pertinent Labs/Radiology:    Latest Ref Rng & Units 05/04/2022    3:29 AM 05/02/2022    1:24 AM 05/01/2022    5:44 AM  CBC  WBC 4.0 - 10.5 K/uL 6.9  8.1  11.0   Hemoglobin 13.0 - 17.0 g/dL 9.3  10.2  9.9   Hematocrit 39.0 - 52.0 % 27.2  29.8  28.4   Platelets 150 - 400 K/uL 87  76  72     Lab Results  Component Value Date   NA 134 (L) 05/09/2022   K 4.4 05/09/2022   CL 105 05/09/2022   CO2 20 (L) 05/09/2022      Assessment/Plan: Decompensated liver cirrhosis with hepatic hydrothorax and recurrent ascites-s/p TIPS on 7/7 Severe hepatic encephalopathy following TIPS-s/p revision of TIPS on 7/15 A bit more awake and alert today compared to yesterday.  Received lactulose enema yesterday.  Lactulose dosage changed to 4 times daily, remains on rifaximin.  No peripheral edema but appears to have ascites and pleural effusion-continue diuretic regimen.  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  Follow.  BPH: Continue Flomax-watch for urinary obstruction.  Palliative care: Family revoked comfort care status-but he remains a DNR.  Long discussion with daughter on 7/21 explained that there is really not much more apart from medication adjustment that we can do-family understands poor prognosis-they are thinking of getting him to SNF with hospice follow-up.  Ultimately-family  is hopeful to take him home with hospice care.  Physical deconditioning: Evaluated by PT/OT-recommendations of SNF.  Nutrition Status: Nutrition Problem: Severe Malnutrition Etiology: chronic illness (hepatic  cirrhosis) Signs/Symptoms: severe muscle depletion, severe fat depletion Interventions: [employer] Breakfast, MVI  BMI: Estimated body mass index is 24.16 kg/m (pended) as calculated from the following:   Height as of this encounter: 5' 9"  (1.753 m).   Weight as of this encounter: (P) 74.2 kg.   Code status:   Code Status: DNR   DVT Prophylaxis: Place and maintain sequential compression device Start: 05/03/22 1116   Family Communication: None at bedside   Disposition Plan: Status is: Inpatient Remains inpatient appropriate because: Resolving encephalopathy-see above documentation-not yet stable for discharge.  Comfort care status revoked on 7/19-needs optimization of medication regimen while inpatient before consideration of discharge.   Planned Discharge Destination:Skilled nursing facility with hospice follow-up.   Diet: Diet Order             Diet 2 gram sodium Room service appropriate? Yes; Fluid consistency: Thin  Diet effective now                     Antimicrobial agents: Anti-infectives (From admission, onward)    Start     Dose/Rate Route Frequency Ordered Stop   05/06/22 1545  rifaximin (XIFAXAN) tablet 550 mg        550 mg Oral 2 times daily 05/06/22 1448     05/02/22 0943  ceFAZolin (ANCEF) IVPB 2g/100 mL premix        over 30 Minutes Intravenous Continuous PRN 05/02/22 0944 05/02/22 0943   04/28/22 1000  rifaximin (XIFAXAN) tablet 550 mg  Status:  Discontinued        550 mg Oral 2 times daily 04/28/22 0741 05/05/22 1244   04/19/22 1000  rifaximin (XIFAXAN) tablet 550 mg  Status:  Discontinued        550 mg Oral 2 times daily 04/28/2022 2043 04/26/22 1750   04/20/2022 1715  cefTRIAXone (ROCEPHIN) 2 g in sodium chloride 0.9 % 100 mL IVPB  Status:  Discontinued        2 g 200 mL/hr over 30 Minutes Intravenous Every 24 hours 05/08/2022 1712 04/25/22 1514        MEDICATIONS: Scheduled Meds:  furosemide  20 mg Oral Daily   lactulose  30 g Oral  QID   midodrine  10 mg Oral TID WC   pantoprazole  40 mg Oral BID   rifaximin  550 mg Oral BID   spironolactone  100 mg Oral Daily   tamsulosin  0.4 mg Oral Daily   Continuous Infusions:  sodium chloride Stopped (05/02/2022 1507)   PRN Meds:.acetaminophen **OR** acetaminophen, albuterol, glycopyrrolate **OR** glycopyrrolate **OR** glycopyrrolate, guaiFENesin, LORazepam, morphine injection, [DISCONTINUED] ondansetron **OR** ondansetron (ZOFRAN) IV, mouth rinse, polyvinyl alcohol   I have personally reviewed following labs and imaging studies  LABORATORY DATA: CBC: Recent Labs  Lab 05/04/22 0329  WBC 6.9  HGB 9.3*  HCT 27.2*  MCV 101.1*  PLT 87*     Basic Metabolic Panel: Recent Labs  Lab 05/04/22 0329 05/05/22 0625 05/07/22 0920 05/08/22 0442 05/09/22 0502  NA 136 134* 133* 134* 134*  K 3.6 4.4 4.5 4.8 4.4  CL 110 109 104 107 105  CO2 22 17* 20* 21* 20*  GLUCOSE 126* 134* 149* 106* 156*  BUN 15 19 34* 27* 22  CREATININE 0.94 1.13 1.01 1.00 0.99  CALCIUM 8.9 8.8* 9.1 9.4 9.4  MG  --  2.1  --   --   --      GFR: Estimated Creatinine Clearance: 64.5 mL/min (by C-G formula based on SCr of 0.99 mg/dL).  Liver Function Tests: Recent Labs  Lab 05/05/22 0625 05/07/22 0920  AST 60* 54*  ALT 40 45*  ALKPHOS 121 113  BILITOT 9.5* 7.3*  PROT 4.4* 4.8*  ALBUMIN 2.6* 2.9*    No results for input(s): "LIPASE", "AMYLASE" in the last 168 hours. Recent Labs  Lab 05/03/22 0041 05/04/22 0329 05/05/22 0625 05/09/22 0502  AMMONIA 92* 100* 116* 76*     Coagulation Profile: No results for input(s): "INR", "PROTIME" in the last 168 hours.   Cardiac Enzymes: No results for input(s): "CKTOTAL", "CKMB", "CKMBINDEX", "TROPONINI" in the last 168 hours.  BNP (last 3 results) No results for input(s): "PROBNP" in the last 8760 hours.  Lipid Profile: No results for input(s): "CHOL", "HDL", "LDLCALC", "TRIG", "CHOLHDL", "LDLDIRECT" in the last 72 hours.  Thyroid  Function Tests: No results for input(s): "TSH", "T4TOTAL", "FREET4", "T3FREE", "THYROIDAB" in the last 72 hours.  Anemia Panel: No results for input(s): "VITAMINB12", "FOLATE", "FERRITIN", "TIBC", "IRON", "RETICCTPCT" in the last 72 hours.  Urine analysis:    Component Value Date/Time   COLORURINE AMBER (A) 04/19/2022 0405   APPEARANCEUR CLEAR 04/19/2022 0405   LABSPEC 1.019 04/19/2022 0405   PHURINE 5.0 04/19/2022 0405   GLUCOSEU NEGATIVE 04/19/2022 0405   HGBUR NEGATIVE 04/19/2022 0405   BILIRUBINUR NEGATIVE 04/19/2022 0405   KETONESUR NEGATIVE 04/19/2022 0405   PROTEINUR NEGATIVE 04/19/2022 0405   UROBILINOGEN 1.0 11/27/2007 1029   NITRITE NEGATIVE 04/19/2022 0405   LEUKOCYTESUR NEGATIVE 04/19/2022 0405    Sepsis Labs: Lactic Acid, Venous    Component Value Date/Time   LATICACIDVEN 2.9 ([place]) 05/01/2022 0544    MICROBIOLOGY: No results found for this or any previous visit (from the past 240 hour(s)).  RADIOLOGY STUDIES/RESULTS: No results found.   LOS: 20 days   Oren Binet, MD  [facility]    To contact the attending provider between 7A-7P or the covering provider during after hours 7P-7A, please log into the web site [email or web address] and access using universal [place] password for that web site. If you do not have the password, please call the hospital operator.  05/09/2022, 12:58 PM

## 2022-05-10 DIAGNOSIS — K7290 Hepatic failure, unspecified without coma: Secondary | ICD-10-CM | POA: Diagnosis not present

## 2022-05-10 DIAGNOSIS — K7682 Hepatic encephalopathy: Secondary | ICD-10-CM | POA: Diagnosis not present

## 2022-05-10 DIAGNOSIS — K7031 Alcoholic cirrhosis of liver with ascites: Secondary | ICD-10-CM | POA: Diagnosis not present

## 2022-05-10 DIAGNOSIS — Z7189 Other specified counseling: Secondary | ICD-10-CM | POA: Diagnosis not present

## 2022-05-10 MED ORDER — ONDANSETRON 4 MG PO TBDP: 4.0000 mg | ORAL_TABLET | Freq: Four times a day (QID) | ORAL | Status: DC | PRN

## 2022-05-10 MED ORDER — BISACODYL 10 MG RE SUPP: 10.0000 mg | Freq: Every day | RECTAL | Status: DC | PRN

## 2022-05-10 MED ORDER — ACETAMINOPHEN 650 MG RE SUPP: 650.0000 mg | Freq: Four times a day (QID) | RECTAL | Status: DC | PRN

## 2022-05-10 MED ORDER — ALBUTEROL SULFATE (2.5 MG/3ML) 0.083% IN NEBU: 2.5000 mg | INHALATION_SOLUTION | RESPIRATORY_TRACT | Status: DC | PRN

## 2022-05-10 MED ORDER — LORAZEPAM 2 MG/ML IJ SOLN: 1.0000 mg | INTRAMUSCULAR | Status: DC | PRN

## 2022-05-10 MED ORDER — HYDROMORPHONE HCL 1 MG/ML IJ SOLN
1.0000 mg | INTRAMUSCULAR | Status: DC
  Administered 2022-05-10 (×3): 1 mg via INTRAVENOUS
  Filled 2022-05-10 (×4): qty 1

## 2022-05-10 MED ORDER — DIPHENHYDRAMINE HCL 50 MG/ML IJ SOLN: 12.5000 mg | INTRAMUSCULAR | Status: DC | PRN

## 2022-05-10 MED ORDER — ACETAMINOPHEN 325 MG PO TABS: 650.0000 mg | ORAL_TABLET | Freq: Four times a day (QID) | ORAL | Status: DC | PRN

## 2022-05-10 MED ORDER — POLYVINYL ALCOHOL 1.4 % OP SOLN: 1.0000 [drp] | Freq: Four times a day (QID) | OPHTHALMIC | Status: DC | PRN

## 2022-05-10 MED ORDER — ONDANSETRON HCL 4 MG/2ML IJ SOLN: 4.0000 mg | Freq: Four times a day (QID) | INTRAMUSCULAR | Status: DC | PRN

## 2022-05-10 MED ORDER — SODIUM CHLORIDE 0.9% FLUSH
3.0000 mL | Freq: Two times a day (BID) | INTRAVENOUS | Status: DC
  Administered 2022-05-10 (×2): 3 mL via INTRAVENOUS

## 2022-05-10 MED ORDER — LORAZEPAM 1 MG PO TABS: 1.0000 mg | ORAL_TABLET | ORAL | Status: DC | PRN

## 2022-05-10 MED ORDER — GLYCOPYRROLATE 0.2 MG/ML IJ SOLN
0.4000 mg | INTRAMUSCULAR | Status: DC | PRN
  Administered 2022-05-10 (×2): 0.4 mg via INTRAVENOUS
  Filled 2022-05-10 (×2): qty 2

## 2022-05-10 MED ORDER — SODIUM CHLORIDE 0.9 % IV SOLN: 250.0000 mL | INTRAVENOUS | Status: DC | PRN

## 2022-05-10 MED ORDER — HYDROMORPHONE HCL 1 MG/ML IJ SOLN
1.0000 mg | INTRAMUSCULAR | Status: DC | PRN
  Administered 2022-05-10: 1 mg via INTRAVENOUS
  Filled 2022-05-10: qty 1

## 2022-05-10 MED ORDER — PANTOPRAZOLE SODIUM 40 MG PO TBEC
40.0000 mg | DELAYED_RELEASE_TABLET | Freq: Every day | ORAL | Status: DC
Start: 2022-05-10 — End: 2022-05-10
  Filled 2022-05-10: qty 1

## 2022-05-10 MED ORDER — LORAZEPAM 2 MG/ML PO CONC
1.0000 mg | ORAL | Status: DC | PRN
Start: 2022-05-10 — End: 2022-05-10

## 2022-05-10 MED ORDER — HYDROMORPHONE HCL 1 MG/ML IJ SOLN
1.0000 mg | INTRAMUSCULAR | Status: DC | PRN
  Administered 2022-05-10 (×4): 1 mg via INTRAVENOUS
  Filled 2022-05-10 (×3): qty 1

## 2022-05-10 MED ORDER — OXYCODONE HCL 5 MG PO TABS: 5.0000 mg | ORAL_TABLET | ORAL | Status: DC | PRN

## 2022-05-10 MED ORDER — SODIUM CHLORIDE 0.9% FLUSH: 3.0000 mL | INTRAVENOUS | Status: DC | PRN

## 2022-05-10 NOTE — Progress Notes (Signed)
Daily Progress Note   Patient Name: Peter Nichols       Date: 05/10/2022 DOB: 04/08/1946  Age: 76 y.o. MRN#: [id number] Attending Physician: Jonetta Osgood, MD Primary Care Physician: [name], MD Admit Date: 04/21/2022  Reason for Consultation/Follow-up: Establishing goals of care  Patient Profile/HPI:   76 y.o. male  with past medical history of neuromuscular disorder, alcoholic cirrhosis with recurrent ascites, hepatic encephalopathy, hepatic hydrothorax, anemia and thrombocytopenia  admitted on 04/28/2022 with decompensated alcoholic hepatic cirrhosis with cystitis, hepatic hydrothorax, acute hepatic encephalopathy and physical deconditioning. He underwent paracentesis with removal of 3 L on 7/2. Right thoracocentesis with removal of 1 L on 7/6.He had TIPS placed on 7/7 by IR.  Hospital course complicated by ileus and ongoing hepatic encephalopathy.  PMT consulted to discuss goals of care.  Subjective: Medical records reviewed. Patient assessed at the bedside. He is unresponsive, appears comfortable after IV Dilaudid.  His family is present at the bedside visiting.  Created space and opportunity for family's thoughts and feelings on his current illness, with notable coffee-ground emesis and worsening encephalopathy today.  Patient's wife Izora Gala explains that the prior plan for SNF is no longer appropriate and she is ready to focus on his comfort at end-of-life with transition back to comfort care.  Emotional support and therapeutic listening provided as she describes a roller coaster it has been over the past several weeks.  He had always wanted to try all of the the medications offered and doctor's recommendation, however he is been asking to die today due to excessive suffering and  abdominal pain.  We discussed end-of-life disease trajectory and expectations, emphasizing that while patients may rally after liberation from medications and monitoring, this does not impact overall poor prognosis.  Wife states she understands this and notes that he may rally again after the rest of his children arrive, as this is what occurred last time.  Family is open to all available medications and want to ensure he is as comfortable as possible.  We discussed home hospice, residential hospice, 24 hours of monitoring to assess appropriate disposition versus hospital death.  Family has previously arranged a meeting tomorrow with [facility] to learn more.  Gone from my sight and hard choices for loving people booklet provided for review.  PMT contact information provided.  Questions  and concerns addressed. PMT will continue to support holistically.   Review of Systems  Unable to perform ROS: Acuity of condition   Physical Exam Vitals and nursing note reviewed.  Constitutional:      Appearance: He is ill-appearing.     Comments: Frail, cachectic  Cardiovascular:     Rate and Rhythm: Normal rate.  Pulmonary:     Effort: Pulmonary effort is normal.  Skin:    Coloration: Skin is jaundiced.     Comments: Scattered petechiae  Neurological:     Comments: sleeping             Vital Signs: BP (!) 108/55 (BP Location: Left Arm)   Pulse 98   Temp 97.6 F (36.4 C)   Resp 16   Ht 5' 9"  (1.753 m)   Wt (P) 74.2 kg   SpO2 94%   BMI (P) 24.16 kg/m  SpO2: SpO2: 94 % O2 Device: O2 Device: Room Air O2 Flow Rate: O2 Flow Rate (L/min): 2 L/min  Intake/output summary:  Intake/Output Summary (Last 24 hours) at 05/10/2022 1122 Last data filed at 05/10/2022 0800 Gross per 24 hour  Intake --  Output 100 ml  Net -100 ml    LBM: Last BM Date : 05/08/22 Baseline Weight: Weight: 71.2 kg Most recent weight: Weight: (P) 74.2 kg       Palliative Assessment/Data: PPS:  10-20%     Palliative Care Assessment & Plan    Assessment/Recommendations/Plan DNR Transition to comfort measures today as discussed with family schedule Dilaudid every 4 hours for pain/air hunger/comfort Robinul PRN for excessive secretions Ativan PRN for agitation/anxiety Zofran PRN for nausea Liquifilm tears PRN for dry eyes May have comfort feeding Comfort cart for family Unrestricted visitations in the setting of EOL (per policy) Oxygen PRN 2L or less for comfort. No escalation.   Ongoing discussion of disposition pending patient's stability over the next 24 hours.  Family considering residential hospice versus home hospice Psychosocial emotional support provided Spiritual care consult for family support PMT will follow   Code Status: DNR  Prognosis: Days  Discharge Planning: To Be Determined  Care plan was discussed with patient family and care team.    MDM: High   Johnell Comings Palliative Medicine Team Team phone # [phone]  Thank you for allowing the Palliative Medicine Team to assist in the care of this patient. Please utilize secure chat with additional questions, if there is no response within 30 minutes please call the above phone number.  Palliative Medicine Team providers are available by phone from 7am to 7pm daily and can be reached through the team cell phone.  Should this patient require assistance outside of these hours, please call the patient's attending physician.

## 2022-05-10 NOTE — Progress Notes (Signed)
PROGRESS NOTE        PATIENT DETAILS Name: Peter Nichols Age: 76 y.o. Sex: male Date of Birth: April 06, 1946 Admit Date: 05/06/2022 Admitting Physician Elmarie Shiley, MD CVE:[id number], Caren Griffins, MD  Brief Summary: Patient is a 76 y.o.  male with history of liver cirrhosis-presented with decompensated cirrhosis with ascites, hepatic hydrothorax, encephalopathy and deconditioning.  He was evaluated by [facility] and IR-underwent paracentesis on 7/2, thoracocentesis on 7/6-ultimately had TIPS procedure on 7/7.  Unfortunately-he developed severe hepatic encephalopathy s/p TIPS placement and underwent TIPS revision with constrained meant.  He was followed closely by palliative care-on 7/18-family agreed to transition to comfort measures, however on 7/19-patient was awake/alert-family elected to DNR but wanted full scope of treatment otherwise.  See below for further details.     Significant events: 7/1>> admit to [facility]-weakness/confusion-decompensated liver cirrhosis. 7/7>> TIPS 7/15>> TIPS revision 7/18>> transition to comfort care by palliative care 7/19>> more awake/alert-family revoked comfort care but remains DNR.  Full scope of treatment. 7/23>> coffee-ground emesis this morning-generalized pain-no BM yesterday-remains lethargic-after discussion with daughter/spouse-comfort care initiated.  Significant studies: 7/1>> CXR: No PNA. 7/3>> CT abdomen/pelvis: No acute finding, umbilical/left mesenteric portosystemic venous collaterals.  Moderate ascites.  Bilateral pleural effusions.  Significant microbiology data: 7/2>>blood cultures NGTD. 7/2>>peritoneal fluid culture NGTD.  Procedures: 7/2>> paracentesis-3 L removed 7/6>> thoracocentesis-1 L removed 7/7>> TIPS procedure by IR 7/15>> TIPS revision by IR 7/15>> paracentesis-1.95 L removed  Consults: GI, IR, palliative care  Subjective: Remains lethargic but still awake/alert.  No BM yesterday.  Had episode of  coffee-ground emesis this morning.  Objective: Vitals: Blood pressure (!) 108/55, pulse 98, temperature 97.6 F (36.4 C), resp. rate 16, height 5' 9"  (1.753 m), weight (P) 74.2 kg, SpO2 94 %.   Exam: Gen Exam: Lethargic but still awake/alert. HEENT:atraumatic, normocephalic Chest: B/L clear to auscultation anteriorly CVS:S1S2 regular Abdomen:soft non tender, non distended Extremities:no edema Neurology: Non focal Skin: no rash   Pertinent Labs/Radiology:    Latest Ref Rng & Units 05/04/2022    3:29 AM 05/02/2022    1:24 AM 05/01/2022    5:44 AM  CBC  WBC 4.0 - 10.5 K/uL 6.9  8.1  11.0   Hemoglobin 13.0 - 17.0 g/dL 9.3  10.2  9.9   Hematocrit 39.0 - 52.0 % 27.2  29.8  28.4   Platelets 150 - 400 K/uL 87  76  72     Lab Results  Component Value Date   NA 134 (L) 05/09/2022   K 4.4 05/09/2022   CL 105 05/09/2022   CO2 20 (L) 05/09/2022      Assessment/Plan: 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  Is complaining of generalized pains-had an episode of coffee-ground emesis today.  Long discussion with daughter this morning-have recommended that we transition to comfort measures-in spite of having a prolonged hospitalization-he is not progressing as we would like.  Subsequently daughter had a chance to talk with the patient's spouse-family has now consented to initiation of full comfort measures.  Stopping diuretics/lactulose/rifaximin.    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   BPH: Supportive care  at this point-if he develops urinary retention-we will need Foley catheter placement.  Palliative care: Coffee-ground emesis this morning-no BM for x24  hours-remains lethargic-complains of worsening generalized pain.  Long discussion with daughter this morning-I have explained that at this point reasonable to consider initiation of comfort measures-as even if we pursue EGD and other supportive care-he will likely be back in the same situation in a matter of few days.  Daughter subsequently talked with patient's spouse-and family has agreed to initiate comfort care.  I have reached out to palliative care as well.  Physical deconditioning: Evaluated by PT/OT-recommendations of SNF.  Nutrition Status: Nutrition Problem: Severe Malnutrition Etiology: chronic illness (hepatic cirrhosis) Signs/Symptoms: severe muscle depletion, severe fat depletion Interventions: [employer] Breakfast, MVI  BMI: Estimated body mass index is 24.16 kg/m (pended) as calculated from the following:   Height as of this encounter: 5' 9"  (1.753 m).   Weight as of this encounter: (P) 74.2 kg.   Code status:   Code Status: DNR   DVT Prophylaxis:    Family Communication: Daughter at bedside.   Disposition Plan: Status is: Inpatient Remains inpatient appropriate because: Initiating comfort care again today-depending how he does over the next 24 hours-we can determine whether residential hospice or SNF is required.   Planned Discharge Destination:Skilled nursing facility with hospice follow-up versus residential hospice.   Diet: Diet Order             Diet 2 gram sodium Room service appropriate? Yes; Fluid consistency: Thin  Diet effective now                     Antimicrobial agents: Anti-infectives (From admission, onward)    Start     Dose/Rate Route Frequency Ordered Stop   05/06/22 1545  rifaximin (XIFAXAN) tablet 550 mg  Status:  Discontinued        550 mg Oral 2 times daily 05/06/22 1448 05/10/22 0940   05/02/22 0943  ceFAZolin (ANCEF) IVPB 2g/100 mL premix        over 30 Minutes Intravenous Continuous PRN 05/02/22 0944 05/02/22 0943    04/28/22 1000  rifaximin (XIFAXAN) tablet 550 mg  Status:  Discontinued        550 mg Oral 2 times daily 04/28/22 0741 05/05/22 1244   04/19/22 1000  rifaximin (XIFAXAN) tablet 550 mg  Status:  Discontinued        550 mg Oral 2 times daily 05/09/2022 2043 04/26/22 1750   04/29/2022 1715  cefTRIAXone (ROCEPHIN) 2 g in sodium chloride 0.9 % 100 mL IVPB  Status:  Discontinued        2 g 200 mL/hr over 30 Minutes Intravenous Every 24 hours 05/07/2022 1712 04/25/22 1514        MEDICATIONS: Scheduled Meds:  pantoprazole  40 mg Oral Daily   sodium chloride flush  3 mL Intravenous Q12H   Continuous Infusions:  sodium chloride     PRN Meds:.sodium chloride, acetaminophen **OR** acetaminophen, albuterol, diphenhydrAMINE, HYDROmorphone (DILAUDID) injection, LORazepam **OR** [DISCONTINUED] LORazepam **OR** LORazepam, ondansetron **OR** ondansetron (ZOFRAN) IV, oxyCODONE, sodium chloride flush   I have personally reviewed following labs and imaging studies  LABORATORY DATA: CBC: Recent Labs  Lab 05/04/22 0329  WBC 6.9  HGB 9.3*  HCT 27.2*  MCV 101.1*  PLT 87*     Basic Metabolic Panel: Recent Labs  Lab 05/04/22 0329 05/05/22 0625 05/07/22 0920 05/08/22 0442 05/09/22 0502  NA 136 134* 133* 134* 134*  K 3.6 4.4 4.5  4.8 4.4  CL 110 109 104 107 105  CO2 22 17* 20* 21* 20*  GLUCOSE 126* 134* 149* 106* 156*  BUN 15 19 34* 27* 22  CREATININE 0.94 1.13 1.01 1.00 0.99  CALCIUM 8.9 8.8* 9.1 9.4 9.4  MG  --  2.1  --   --   --      GFR: Estimated Creatinine Clearance: 64.5 mL/min (by C-G formula based on SCr of 0.99 mg/dL).  Liver Function Tests: Recent Labs  Lab 05/05/22 0625 05/07/22 0920  AST 60* 54*  ALT 40 45*  ALKPHOS 121 113  BILITOT 9.5* 7.3*  PROT 4.4* 4.8*  ALBUMIN 2.6* 2.9*    No results for input(s): "LIPASE", "AMYLASE" in the last 168 hours. Recent Labs  Lab 05/04/22 0329 05/05/22 0625 05/09/22 0502  AMMONIA 100* 116* 76*     Coagulation  Profile: No results for input(s): "INR", "PROTIME" in the last 168 hours.   Cardiac Enzymes: No results for input(s): "CKTOTAL", "CKMB", "CKMBINDEX", "TROPONINI" in the last 168 hours.  BNP (last 3 results) No results for input(s): "PROBNP" in the last 8760 hours.  Lipid Profile: No results for input(s): "CHOL", "HDL", "LDLCALC", "TRIG", "CHOLHDL", "LDLDIRECT" in the last 72 hours.  Thyroid Function Tests: No results for input(s): "TSH", "T4TOTAL", "FREET4", "T3FREE", "THYROIDAB" in the last 72 hours.  Anemia Panel: No results for input(s): "VITAMINB12", "FOLATE", "FERRITIN", "TIBC", "IRON", "RETICCTPCT" in the last 72 hours.  Urine analysis:    Component Value Date/Time   COLORURINE AMBER (A) 04/19/2022 0405   APPEARANCEUR CLEAR 04/19/2022 0405   LABSPEC 1.019 04/19/2022 0405   PHURINE 5.0 04/19/2022 0405   GLUCOSEU NEGATIVE 04/19/2022 0405   HGBUR NEGATIVE 04/19/2022 0405   BILIRUBINUR NEGATIVE 04/19/2022 0405   KETONESUR NEGATIVE 04/19/2022 0405   PROTEINUR NEGATIVE 04/19/2022 0405   UROBILINOGEN 1.0 11/27/2007 1029   NITRITE NEGATIVE 04/19/2022 0405   LEUKOCYTESUR NEGATIVE 04/19/2022 0405    Sepsis Labs: Lactic Acid, Venous    Component Value Date/Time   LATICACIDVEN 2.9 ([place]) 05/01/2022 0544    MICROBIOLOGY: No results found for this or any previous visit (from the past 240 hour(s)).  RADIOLOGY STUDIES/RESULTS: No results found.   LOS: 21 days   Oren Binet, MD  [facility]    To contact the attending provider between 7A-7P or the covering provider during after hours 7P-7A, please log into the web site [email or web address] and access using universal [place] password for that web site. If you do not have the password, please call the hospital operator.  05/10/2022, 9:45 AM

## 2022-05-11 ENCOUNTER — Other Ambulatory Visit (HOSPITAL_COMMUNITY): Payer: Medicare HMO

## 2022-05-13 ENCOUNTER — Other Ambulatory Visit (HOSPITAL_COMMUNITY): Payer: Medicare HMO

## 2022-05-19 NOTE — Death Summary Note (Signed)
DEATH SUMMARY   Patient Details  Name: Peter Nichols MRN: [id number] DOB: Mar 25, 1946 WUJ:[id number], Caren Griffins, MD Admission/Discharge Information   Admit Date:  May 06, 2022  Date of Death: Date of Death: May 29, 2022  Time of Death: Time of Death: 0137  Length of Stay: December 28, 2022   Principle Cause of death: Decompensated liver cirrhosis  Hospital Diagnoses: Principal Problem:   Decompensated hepatic cirrhosis ([place]) Active Problems:   Thrombocytopenia (HCC)   Steatohepatitis, non-alcoholic   Alcoholic cirrhosis of liver with ascites (HCC)   Anemia, unspecified   Lactic acidosis   GERD (gastroesophageal reflux disease)   Generalized weakness   Goals of care, counseling/discussion   DNR (do not resuscitate)   Protein-calorie malnutrition, severe   Hospital Course: Patient is a 77 y.o.  male with history of liver cirrhosis-presented with decompensated cirrhosis with ascites, hepatic hydrothorax, encephalopathy and deconditioning.  He was evaluated by [facility] and IR-underwent paracentesis on 7/2, thoracocentesis on 7/6-ultimately had TIPS procedure on 7/7.  Unfortunately-he developed severe hepatic encephalopathy s/p TIPS placement and underwent TIPS revision with constrained meant.  He was followed closely by palliative care-on 7/18-family agreed to transition to comfort measures, however on 7/19-patient was awake/alert-family elected to DNR but wanted full scope of treatment otherwise.  See below for further details.       Significant events: 7/1>> admit to [facility]-weakness/confusion-decompensated liver cirrhosis. 7/7>> TIPS 7/15>> TIPS revision 7/18>> transition to comfort care by palliative care 7/19>> more awake/alert-family revoked comfort care but remains DNR.  Full scope of treatment. 7/23>> coffee-ground emesis this morning-generalized pain-no BM yesterday-remains lethargic-after discussion with daughter/spouse-comfort care initiated.   Significant studies: 7/1>> CXR: No PNA. 7/3>> CT  abdomen/pelvis: No acute finding, umbilical/left mesenteric portosystemic venous collaterals.  Moderate ascites.  Bilateral pleural effusions.   Significant microbiology data: 7/2>>blood cultures NGTD. 7/2>>peritoneal fluid culture NGTD.   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  Initially made comfort care on 7/18 but family revoked comfort care status on 7/19-patient continued to slowly deteriorate-after extensive discussion with family-made comfort care on 7/23.  Palliative care followed closely during this hospitalization.  Upper GI bleeding: Occurred on 7/23-had hematemesis-after extensive discussion-family elected to transition to comfort measures.  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    BPH: Flomax was discontinued-Foley catheter was placed on 7/23 for comfort.   Palliative care: See above-extensive discussion during this hospitalization-made comfort care on 7/18 but revoked on 7/19 as patient perked up and was more awake and alert-but since then gradually deteriorated-had coffee-ground emesis/generalized pain/worsening encephalopathy on 7/23 in spite of maximal medical support-family agreed to transition to comfort care.  DNR remained in place.   Physical deconditioning: Evaluated by PT/OT-recommendations were for SNF.   Nutrition Status: Nutrition Problem: Severe Malnutrition Etiology:  chronic illness (hepatic cirrhosis) Signs/Symptoms: severe muscle depletion, severe fat depletion Interventions: [employer] Breakfast, MVI   BMI: Estimated body mass index is 24.16 kg/m (pended) as calculated from the following:   Height as of this encounter: 5' 9"  (1.753 m).  Weight as of this encounter: (P) 74.2 kg.    The results of significant diagnostics from this hospitalization (including imaging, microbiology, ancillary and laboratory) are listed below for reference.   Significant Diagnostic Studies: DG Chest Port 1 View  Result Date: 05/05/2022 CLINICAL DATA:  Generalized weakness, difficulty breathing EXAM: PORTABLE CHEST 1 VIEW COMPARISON:  Previous studies including the examination of 05/01/2022 FINDINGS: There is improvement in aeration of right upper lung field. There is haziness in the right mid and both lower lung fields. This may be partly due to layering of pleural effusions. Evaluation of lower lung fields for infiltrates is limited by the effusions. There is no pneumothorax. IMPRESSION: Small to moderate bilateral pleural effusions, more so on the right side. There is interval improvement in aeration of right lung, possibly suggesting decrease in right pleural effusion. Electronically Signed   By: Elmer Picker M.D.   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 Clinical picture suspicious for over shunting. EXAM: Procedures: 1. PORTAL and HEPATIC VENOGRAPHY 2. TRANSJUGULAR INTRAHEPATIC PORTOSYSTEMIC SHUNT (TIPS) REVISION 3. THERAPEUTIC PARACENTESIS MEDICATIONS: As antibiotic prophylaxis, Ancef 2 gm IV was ordered pre-procedure and administered intravenously within one hour of incision. ANESTHESIA/SEDATION: Moderate (conscious) sedation was  employed during this procedure. A total of Versed 1 mg and Fentanyl 50 mcg was administered intravenously. Moderate Sedation Time: 116 minutes. The patient's level of consciousness and vital signs were monitored continuously by radiology nursing throughout the procedure under my direct supervision. CONTRAST:  80 mL Omnipaque 300 FLUOROSCOPY TIME:  Fluoroscopic dose; 672 mGy COMPLICATIONS: None immediate. PROCEDURE: Informed written consent was obtained from the the patient and/or patient's representative after a thorough discussion of the procedural risks, benefits and alternatives. All questions were addressed. Maximal Sterile Barrier Technique was utilized including caps, mask, sterile gowns, sterile gloves, sterile drape, hand hygiene and skin antiseptic. The skin overlying the RIGHT upper abdominal quadrant as well as the RIGHT neck were prepped and draped in usual sterile fashion. A timeout was performed prior to the initiation of the procedure. THERAPEUTIC PARACENTESIS; Initial ultrasound scanning demonstrates a moderate amount of ascites within the right lower abdominal quadrant. The RIGHT lower abdomen was prepped and draped in the usual sterile fashion. 1% lidocaine with epinephrine was used for local anesthesia. An ultrasound image was saved for documentation purposed. An 8 Fr Safe-T-Centesis catheter was introduced. The paracentesis was performed as we proceeded with the TIPS revision. TIPS REVISION; 1% Lidocaine was used for local anesthesia. Ultrasound evaluation showed a patent RIGHT internal jugular vein. Ultrasound image was saved and sent to PACS. Under ultrasound guidance, the right internal jugular vein was access using a 21-G needle which was dilated and exchanged for a 5 Fr sheath over a guidewire. Under fluoroscopic guidance, utilizing a 5 Fr MPA catheter and 0.035-inch Bentson guidewire manipulation, the catheter was advanced through the TIPS shunt and into the main portal vein. The catheter  was exchanged for a 5 Fr Omni-flush catheter and a digital subtraction venography was performed. The TIPS shunt was patent and flow was identified within the intrahepatic portal veins. A 0.035 inch Amplatz wire was placed then the Omni flush catheter was removed. A 16 Fr 35 cm Flexor sheath was placed over the wire. Pre-dilatation pressure measurements were performed in the portal vein and in the RIGHT atrium.  An additional Amplatz wire was placed into the portal vein. Parallel 5 mm x 1.9 cm balloon inflatable VBX and 9 mm x 7.5 cm self expanding VIABAHN stents were placed within the shunt. High-resolution fluoroscopic images were obtained to ensure appropriate positioning. Serially, the balloon inflatable then self expanding stents were deployed under direct visualization. Completion portal venogram demonstrated a successful deployment of the stents with appropriate constrainment. Post dilatation pressure measurements were obtained following the revision in a similar fashion. Images were reviewed and the procedure was terminated. All wires, catheters and sheaths were removed from the patient. Hemostasis was achieved at the RIGHT neck and RIGHT upper abdominal access sites with manual compression. Dressings were applied. The patient tolerated the procedure well without immediate postprocedural complication. Pre-TIPS revision mean Pressures (mmHg): Right atrium: 2 Portal vein: 16 Portosystemic gradient: 14 Post-TIPS revision mean Pressures (mmHg): Right atrium:1 Portal vein: 18 Portosystemic gradient: 17 FINDINGS: 1. No residual flow into splenic vein varix s/p prior coil embolization. 2. TIPS revision with constrainment, by placement of parallel 15m VBX and 926mVIABAHN GORE stents. 3. Increase of portal HTN with portosystemic gradient from 14 to 17 4. Therapeutic paracentesis with 1.95 L of serous ascites removed. IMPRESSION: 1. Successful revision of transjugular intrahepatic portosystemic shunt (TIPS) by  constraintment with intended increase in portal systemic gradient, as above. 2. Successful ultrasound-guided therapeutic paracentesis yielding 1.95 L of ascitic fluid. PLAN: 1. Post jugular sheath removal orders including bedrest x1 hrs. No HOB restrictions. 2. Continue lactulose TID and monitor for change in hepatic encephalopathy. 3. The patient will be closely followed in the Vascular Interventional Radiology (VIR) portal hypertension clinic by my colleague Dr. SuSerafina Royalsn approximately 4 weeks. JoMichaelle BirksMD [facility] Electronically Signed   By: JoMichaelle Birks.D.   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 Clinical picture suspicious for over shunting. EXAM: Procedures: 1. PORTAL and HEPATIC VENOGRAPHY 2. TRANSJUGULAR INTRAHEPATIC PORTOSYSTEMIC SHUNT (TIPS) REVISION 3. THERAPEUTIC PARACENTESIS MEDICATIONS: As antibiotic prophylaxis, Ancef 2 gm IV was ordered pre-procedure and administered intravenously within one hour of incision. ANESTHESIA/SEDATION: Moderate (conscious) sedation was employed during this procedure. A total of Versed 1 mg and Fentanyl 50 mcg was administered intravenously. Moderate Sedation Time: 116 minutes. The patient's level of consciousness and vital signs were monitored continuously by radiology nursing throughout the procedure under my direct supervision. CONTRAST:  80 mL Omnipaque 300 FLUOROSCOPY TIME:  Fluoroscopic dose; 91235Gy COMPLICATIONS: None immediate. PROCEDURE: Informed written consent was obtained from the the patient and/or patient's representative after a thorough discussion of the procedural risks, benefits and alternatives. All questions were addressed. Maximal Sterile Barrier  Technique was utilized including caps, mask, sterile gowns, sterile gloves, sterile drape, hand hygiene and skin antiseptic. The skin overlying the RIGHT upper abdominal quadrant as well as the RIGHT neck were prepped and draped in usual sterile fashion. A timeout was performed prior to the initiation of the procedure. THERAPEUTIC PARACENTESIS; Initial ultrasound scanning demonstrates a moderate amount of ascites within the right lower abdominal quadrant. The RIGHT lower abdomen was prepped and draped in the usual sterile fashion. 1% lidocaine with epinephrine was used for local anesthesia. An ultrasound image was saved for documentation purposed. An 8 Fr Safe-T-Centesis catheter was introduced. The paracentesis was  performed as we proceeded with the TIPS revision. TIPS REVISION; 1% Lidocaine was used for local anesthesia. Ultrasound evaluation showed a patent RIGHT internal jugular vein. Ultrasound image was saved and sent to PACS. Under ultrasound guidance, the right internal jugular vein was access using a 21-G needle which was dilated and exchanged for a 5 Fr sheath over a guidewire. Under fluoroscopic guidance, utilizing a 5 Fr MPA catheter and 0.035-inch Bentson guidewire manipulation, the catheter was advanced through the TIPS shunt and into the main portal vein. The catheter was exchanged for a 5 Fr Omni-flush catheter and a digital subtraction venography was performed. The TIPS shunt was patent and flow was identified within the intrahepatic portal veins. A 0.035 inch Amplatz wire was placed then the Omni flush catheter was removed. A 16 Fr 35 cm Flexor sheath was placed over the wire. Pre-dilatation pressure measurements were performed in the portal vein and in the RIGHT atrium. An additional Amplatz wire was placed into the portal vein. Parallel 5 mm x 1.9 cm balloon inflatable VBX and 9 mm x 7.5 cm self expanding VIABAHN stents were placed within the shunt. High-resolution fluoroscopic images were  obtained to ensure appropriate positioning. Serially, the balloon inflatable then self expanding stents were deployed under direct visualization. Completion portal venogram demonstrated a successful deployment of the stents with appropriate constrainment. Post dilatation pressure measurements were obtained following the revision in a similar fashion. Images were reviewed and the procedure was terminated. All wires, catheters and sheaths were removed from the patient. Hemostasis was achieved at the RIGHT neck and RIGHT upper abdominal access sites with manual compression. Dressings were applied. The patient tolerated the procedure well without immediate postprocedural complication. Pre-TIPS revision mean Pressures (mmHg): Right atrium: 2 Portal vein: 16 Portosystemic gradient: 14 Post-TIPS revision mean Pressures (mmHg): Right atrium:1 Portal vein: 18 Portosystemic gradient: 17 FINDINGS: 1. No residual flow into splenic vein varix s/p prior coil embolization. 2. TIPS revision with constrainment, by placement of parallel 36m VBX and 987mVIABAHN GORE stents. 3. Increase of portal HTN with portosystemic gradient from 14 to 17 4. Therapeutic paracentesis with 1.95 L of serous ascites removed. IMPRESSION: 1. Successful revision of transjugular intrahepatic portosystemic shunt (TIPS) by constraintment with intended increase in portal systemic gradient, as above. 2. Successful ultrasound-guided therapeutic paracentesis yielding 1.95 L of ascitic fluid. PLAN: 1. Post jugular sheath removal orders including bedrest x1 hrs. No HOB restrictions. 2. Continue lactulose TID and monitor for change in hepatic encephalopathy. 3. The patient will be closely followed in the Vascular Interventional Radiology (VIR) portal hypertension clinic by my colleague Dr. SuSerafina Royalsn approximately 4 weeks. JoMichaelle BirksMD [facility] Electronically Signed   By: JoMichaelle Birks.D.   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 Clinical picture suspicious for over shunting. EXAM: Procedures: 1. PORTAL and HEPATIC VENOGRAPHY 2. TRANSJUGULAR INTRAHEPATIC PORTOSYSTEMIC SHUNT (TIPS) REVISION 3. THERAPEUTIC PARACENTESIS MEDICATIONS: As antibiotic prophylaxis, Ancef 2 gm IV was ordered pre-procedure and administered intravenously within one hour of incision. ANESTHESIA/SEDATION: Moderate (conscious) sedation was employed during this procedure. A total of Versed 1 mg and Fentanyl 50 mcg was administered intravenously. Moderate Sedation Time: 116 minutes. The patient's level of consciousness and vital signs were monitored continuously by radiology nursing throughout the  procedure under my direct supervision. CONTRAST:  80 mL Omnipaque 300 FLUOROSCOPY TIME:  Fluoroscopic dose; 865 mGy COMPLICATIONS: None immediate. PROCEDURE: Informed written consent was obtained from the the patient and/or patient's representative after a thorough discussion of the procedural risks, benefits and alternatives. All questions were addressed. Maximal Sterile Barrier Technique was utilized including caps, mask, sterile gowns, sterile gloves, sterile drape, hand hygiene and skin antiseptic. The skin overlying the RIGHT upper abdominal quadrant as well as the RIGHT neck were prepped and draped in usual sterile fashion. A timeout was performed prior to the initiation of the procedure. THERAPEUTIC PARACENTESIS; Initial ultrasound scanning demonstrates a moderate amount of ascites within the right lower abdominal quadrant. The RIGHT lower abdomen was prepped and draped in the usual sterile fashion. 1% lidocaine with epinephrine was used for local anesthesia. An ultrasound image was saved for documentation  purposed. An 8 Fr Safe-T-Centesis catheter was introduced. The paracentesis was performed as we proceeded with the TIPS revision. TIPS REVISION; 1% Lidocaine was used for local anesthesia. Ultrasound evaluation showed a patent RIGHT internal jugular vein. Ultrasound image was saved and sent to PACS. Under ultrasound guidance, the right internal jugular vein was access using a 21-G needle which was dilated and exchanged for a 5 Fr sheath over a guidewire. Under fluoroscopic guidance, utilizing a 5 Fr MPA catheter and 0.035-inch Bentson guidewire manipulation, the catheter was advanced through the TIPS shunt and into the main portal vein. The catheter was exchanged for a 5 Fr Omni-flush catheter and a digital subtraction venography was performed. The TIPS shunt was patent and flow was identified within the intrahepatic portal veins. A 0.035 inch Amplatz wire was placed then the Omni flush catheter was removed. A 16 Fr 35 cm Flexor sheath was placed over the wire. Pre-dilatation pressure measurements were performed in the portal vein and in the RIGHT atrium. An additional Amplatz wire was placed into the portal vein. Parallel 5 mm x 1.9 cm balloon inflatable VBX and 9 mm x 7.5 cm self expanding VIABAHN stents were placed within the shunt. High-resolution fluoroscopic images were obtained to ensure appropriate positioning. Serially, the balloon inflatable then self expanding stents were deployed under direct visualization. Completion portal venogram demonstrated a successful deployment of the stents with appropriate constrainment. Post dilatation pressure measurements were obtained following the revision in a similar fashion. Images were reviewed and the procedure was terminated. All wires, catheters and sheaths were removed from the patient. Hemostasis was achieved at the RIGHT neck and RIGHT upper abdominal access sites with manual compression. Dressings were applied. The patient tolerated the procedure well without  immediate postprocedural complication. Pre-TIPS revision mean Pressures (mmHg): Right atrium: 2 Portal vein: 16 Portosystemic gradient: 14 Post-TIPS revision mean Pressures (mmHg): Right atrium:1 Portal vein: 18 Portosystemic gradient: 17 FINDINGS: 1. No residual flow into splenic vein varix s/p prior coil embolization. 2. TIPS revision with constrainment, by placement of parallel 40m VBX and 964mVIABAHN GORE stents. 3. Increase of portal HTN with portosystemic gradient from 14 to 17 4. Therapeutic paracentesis with 1.95 L of serous ascites removed. IMPRESSION: 1. Successful revision of transjugular intrahepatic portosystemic shunt (TIPS) by constraintment with intended increase in portal systemic gradient, as above. 2. Successful ultrasound-guided therapeutic paracentesis yielding 1.95 L of ascitic fluid. PLAN: 1. Post jugular sheath removal orders including bedrest x1 hrs. No HOB restrictions. 2. Continue lactulose TID and monitor for change in hepatic encephalopathy. 3. The patient will be closely followed in the Vascular Interventional Radiology (VIR) portal hypertension  clinic by my colleague Dr. Serafina Royals in approximately 4 weeks. Michaelle Birks, MD [facility]iology Electronically Signed   By: Michaelle Birks M.D.   On: 05/03/2022 17:02   [place] ASCITES (ABDOMEN LIMITED)  Result Date: 05/01/2022 CLINICAL DATA:  Ascites EXAM: LIMITED ABDOMEN ULTRASOUND FOR ASCITES TECHNIQUE: Limited ultrasound survey for ascites was performed in all four abdominal quadrants. COMPARISON:  None available FINDINGS: Mild ascites noted in the paracolic gutters. Small bilateral pleural effusions also noted. IMPRESSION: Mild abdominal ascites. Electronically Signed   By: Miachel Roux M.D.   On: 05/01/2022 12:01   DG Chest Port 1 View  Result Date: 05/01/2022 CLINICAL DATA:  Cough. EXAM: PORTABLE CHEST 1 VIEW COMPARISON:  Chest x-ray dated April 23, 2022. FINDINGS: The heart size and  mediastinal contours are within normal limits. Normal pulmonary vascularity. Increasing hazy density overlying the right mid and lower lung consistent with recurrent pleural effusion. No pneumothorax. No acute osseous abnormality. IMPRESSION: 1. Recurrent right pleural effusion. Electronically Signed   By: Titus Dubin M.D.   On: 05/01/2022 09:39   DG Abd Portable 1V  Result Date: 04/29/2022 CLINICAL DATA:  Abdominal distention. EXAM: PORTABLE ABDOMEN - 1 VIEW COMPARISON:  [facility] 04/27/2022; CT abdomen and pelvis 04/20/2022 FINDINGS: Moderate fluid material within the stomach. Air and stool are seen likely within the descending colon. Mild interval decrease in air-filled distention of the small bowel. Small bowel loops again measure up to approximately 3.5 cm in caliber. A likely TIPS metallic stent again overlies the right upper abdominal quadrant. Cholecystectomy clips. Left lateral vascular coils. Chronic mid left ilium bone loss is again seen. IMPRESSION: 1. Mild interval decrease in air-filled loops of small bowel, likely mildly improved ileus. 2. Interval removal of nasogastric tube. Electronically Signed   By: Yvonne Kendall M.D.   On: 04/29/2022 15:50   DG Abd Portable 1V  Result Date: 04/27/2022 CLINICAL DATA:  NG tube placement. EXAM: PORTABLE ABDOMEN - 1 VIEW COMPARISON:  04/27/2022. FINDINGS: There is redemonstration of multiple loops of gas-filled distended small bowel in the abdomen measuring up to 3.5 cm. An enteric tube terminates in the stomach. Cholecystectomy clips, vascular coils, and stent appear stable. IMPRESSION: 1. Stable dilated loops of small bowel in the abdomen, consistent with history of ileus. 2. The NG tube terminates in the stomach. Electronically Signed   By: Brett Fairy M.D.   On: 04/27/2022 02:23   DG Abd 1 View  Addendum Date: 04/27/2022   ADDENDUM REPORT: 04/27/2022 01:15 ADDENDUM: Critical findings were reported to patient's nurse Esther at 1:15 a.m. Electronically  Signed   By: Brett Fairy M.D.   On: 04/27/2022 01:15   Result Date: 04/27/2022 CLINICAL DATA:  Ileus, NG tube placement. EXAM: ABDOMEN - 1 VIEW COMPARISON:  None Available. FINDINGS: Multiple dilated air-fluid loops of small bowel are noted in the abdomen measuring up to 3.6 cm. The reported nasogastric tube is seen at the right lung base and is likely in the right mainstem bronchus. Stable metallic coils are noted in the left upper quadrant, surgical clips and stent in the right upper quadrant. IMPRESSION: 1. Nasogastric tube projects over the right lung base and is likely in the right mainstem bronchus and should be repositioned. 2. Stable gas-filled dilated loops of small bowel, compatible with history of ileus. Electronically Signed: By: Brett Fairy M.D. On: 04/27/2022 01:01   DG Abd Portable 1V  Result Date: 04/26/2022 CLINICAL DATA:  Ileus. EXAM: PORTABLE ABDOMEN - 1  VIEW COMPARISON:  Abdominal CTA 04/20/2022 FINDINGS: TIPS in the right upper quadrant. Vascular coils in the left upper quadrant. Cholecystectomy clips. Mild diffuse gaseous prominence of small bowel, up 3.8 cm in dimension. Small volume of formed stool in the colon. No evidence of free air. IMPRESSION: Mild diffuse gaseous prominence of small bowel, consistent with provided history of ileus. Electronically Signed   By: Keith Rake M.D.   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 EXAM: 1. Ultrasound-guided paracentesis 2. Ultrasound-guided access of the right internal jugular vein 3. Ultrasound-guided access of the right common femoral vein 4. Hepatic venogram 5. Intravascular ultrasound 6. Catheterization of the portal vein 7. Portal venous and central  manometry 8. Portal venogram 9. Creation of a transhepatic portal vein to hepatic vein shunt 10. Coil embolization of ectopic varix arising from the splenic vein MEDICATIONS: The patient was receiving intravenous antibiotics as an inpatient. ANESTHESIA/SEDATION: General - as administered by the Anesthesia department CONTRAST:  Eighty ML Omnipaque 300, intravenous FLUOROSCOPY TIME:  Fluoroscopy Time: 35.9 minutes (939 mGy). COMPLICATIONS: None immediate. PROCEDURE: Informed written consent was obtained from the patient after a thorough discussion of the procedural risks, benefits and alternatives. All questions were addressed. Maximal Sterile Barrier Technique was utilized including caps, mask, sterile gowns, sterile gloves, sterile drape, hand hygiene and skin antiseptic. A timeout was performed prior to the initiation of the procedure. The procedure was performed with the assistance of my partner, Dr. Ronny Bacon. Preprocedure ultrasound evaluation demonstrated large volume ascites in the right lower quadrant. The procedure was planned. A small skin nick was made. Under direct ultrasound visualization, a 6 French Safe-T-Centesis needle was directed into the ascites. A total of 3.9 L of translucent, straw-colored fluid was removed throughout the course of the procedure. After drainage, the catheter was removed and a sterile bandage was placed. A preliminary ultrasound of the right groin was performed and demonstrates a patent right common femoral vein. A permanent ultrasound image was recorded. Using a combination of fluoroscopy and ultrasound, an access site was determined. A small dermatotomy was made at the planned puncture site. Using ultrasound guidance, access into the right common femoral vein was obtained with visualization of needle entry into the vessel using a standard micropuncture technique. A wire was advanced into the IVC insert all fascial dilation performed. An 8 [place], 11 cm vascular sheath was  placed into the external iliac vein. Through this access site, an 57 [place] ICE catheter was advanced with ease under fluoroscopic guidance to the level of the intrahepatic inferior vena cava. A preliminary ultrasound of the right neck was performed and demonstrates a patent internal jugular vein. A permanent ultrasound image was recorded. Using a combination of fluoroscopy and ultrasound, an access site was determined. A small dermatotomy was made at the planned puncture site. Using ultrasound guidance, access into the right internal jugular vein was obtained with visualization of needle entry into the vessel using a standard micropuncture technique. A wire was advanced into the IVC and serial fascial dilation performed. A 10 French tips sheath was placed into the internal jugular vein and advanced to the IVC. The jugular sheath was retracted into the right atrium and manometry was performed. A 5 French angled tip catheter was  then directed into the middle hepatic vein. Hepatic venogram was performed. These images demonstrated patent hepatic vein with no stenosis. The catheter was advanced to a wedge portion of the a patent vein over which the 10 French sheath was advanced into the middle hepatic vein. Using ICE ultrasound visualization the catheter as middle hepatic vein as well as the portal anatomy was defined. A planned exit site from the hepatic vein and puncture site from the portal vein was placed into a single sonographic plane. Under direct ultrasound visualization, the ScorpionX needle was advanced into the central right portal vein. Hand injection of contrast confirmed position within the portal system. A Glidewire Advantage was then advanced into the splenic vein. A 5 French marking pigtail catheter was then advanced over the wire into the main portal vein and wire removed. Portal venogram was performed which demonstrated a patent portal vein. There was a large varix arising from the splenic  hilum which drained along the left flank and distributed into the left pelvic/left flank wound. Outflow is visualized within the left iliac vein. 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 Portal manometry was then performed. The tract was then dilated to 8 mm with an 8 mm x 8 cm Athletis balloon. A 8-10 mm by 7 + 2 mm of Viatorr endograft was placed. No post deployment balloon molding was performed. Next, the splenic vein was again cannulated and the large ectopic varix was selected. An assortment of 0.035"Azur coils were deployed. After coil deployment, there is persistent contrast flow through the varix. Therefore, a thick Gel-Foam slurry was mixed and administered in small aliquots under direct fluoroscopic visualization near the splenic aspect of the coil pack. There is no evidence of Gel-Foam slurry passing through the coil pack. After injection of a proximally 5 cc, there was complete embolization of the ectopic varix. Completion portogram was performed which demonstrated a patent indwelling tips stent with brisk antegrade flow. In general, there was improved hepatopetal flow throughout the portal system. After placement of the shunt, right atrial and portal pressures were repeated. The catheters and sheath were removed and manual compression was applied to the right internal jugular and right common femoral venous access sites until hemostasis was achieved. The patient was transferred to the PACU in stable condition. Pre-TIPS Mean Pressures (mmHg): Right atrium: 6 Portal vein: 24 Portosystemic gradient: 18 Post-TIPS Mean Pressures (mmHg): Right atrium:15 Portal vein: 23 Portosystemic gradient: 8 IMPRESSION: 1. 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 2. Successful transjugular portosystemic shunt creation. 3. Successful coil embolization of ectopic varix/portosystemic shunt arising from the splenic vein. 4.  Portosystemic gradient of 18 mm Hg (absolute portal venous pressure 24 mm Hg) before shunt placement and 8 mm Hg (absolute portal venous pressure 23 mm Hg) after shunt placement and coil embolization of ectopic varix. PLAN: Interventional Radiology will follow the patient as an inpatient. The Interventional Radiology Portal Hypertension Clinic will provide close follow-up after discharge. Ruthann Cancer, MD [facility] Radiology Electronically Signed   By: Ruthann Cancer M.D.   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 EXAM: 1. Ultrasound-guided paracentesis 2. Ultrasound-guided access of  the right internal jugular vein 3. Ultrasound-guided access of the right common femoral vein 4. Hepatic venogram 5. Intravascular ultrasound 6. Catheterization of the portal vein 7. Portal venous and central manometry 8. Portal venogram 9. Creation of a transhepatic portal vein to hepatic vein shunt 10. Coil embolization of ectopic varix arising from the splenic vein MEDICATIONS: The patient was receiving intravenous antibiotics as an inpatient. ANESTHESIA/SEDATION: General - as administered by the Anesthesia department CONTRAST:  Eighty ML Omnipaque 300, intravenous FLUOROSCOPY TIME:  Fluoroscopy Time: 35.9 minutes (939 mGy). COMPLICATIONS: None immediate. PROCEDURE: Informed written consent was obtained from the patient after a thorough discussion of the procedural risks, benefits and alternatives. All questions were addressed. Maximal Sterile Barrier Technique was utilized including caps, mask, sterile gowns, sterile gloves, sterile drape, hand hygiene and skin antiseptic. A timeout was  performed prior to the initiation of the procedure. The procedure was performed with the assistance of my partner, Dr. Ronny Bacon. Preprocedure ultrasound evaluation demonstrated large volume ascites in the right lower quadrant. The procedure was planned. A small skin nick was made. Under direct ultrasound visualization, a 6 French Safe-T-Centesis needle was directed into the ascites. A total of 3.9 L of translucent, straw-colored fluid was removed throughout the course of the procedure. After drainage, the catheter was removed and a sterile bandage was placed. A preliminary ultrasound of the right groin was performed and demonstrates a patent right common femoral vein. A permanent ultrasound image was recorded. Using a combination of fluoroscopy and ultrasound, an access site was determined. A small dermatotomy was made at the planned puncture site. Using ultrasound guidance, access into the right common femoral vein was obtained with visualization of needle entry into the vessel using a standard micropuncture technique. A wire was advanced into the IVC insert all fascial dilation performed. An 8 [place], 11 cm vascular sheath was placed into the external iliac vein. Through this access site, an 78 [place] ICE catheter was advanced with ease under fluoroscopic guidance to the level of the intrahepatic inferior vena cava. A preliminary ultrasound of the right neck was performed and demonstrates a patent internal jugular vein. A permanent ultrasound image was recorded. Using a combination of fluoroscopy and ultrasound, an access site was determined. A small dermatotomy was made at the planned puncture site. Using ultrasound guidance, access into the right internal jugular vein was obtained with visualization of needle entry into the vessel using a standard micropuncture technique. A wire was advanced into the IVC and serial fascial dilation performed. A 10 French tips sheath was placed into the internal jugular  vein and advanced to the IVC. The jugular sheath was retracted into the right atrium and manometry was performed. A 5 French angled tip catheter was then directed into the middle hepatic vein. Hepatic venogram was performed. These images demonstrated patent hepatic vein with no stenosis. The catheter was advanced to a wedge portion of the a patent vein over which the 10 French sheath was advanced into the middle hepatic vein. Using ICE ultrasound visualization the catheter as middle hepatic vein as well as the portal anatomy was defined. A planned exit site from the hepatic vein and puncture site from the portal vein was placed into a single sonographic plane. Under direct ultrasound visualization, the ScorpionX needle was advanced into the central right portal vein. Hand injection of contrast confirmed position within the portal system. A Glidewire Advantage was then advanced into the splenic vein. A 5 French marking pigtail catheter was then advanced  over the wire into the main portal vein and wire removed. Portal venogram was performed which demonstrated a patent portal vein. There was a large varix arising from the splenic hilum which drained along the left flank and distributed into the left pelvic/left flank wound. Outflow is visualized within the left iliac vein. 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 Portal manometry was then performed. The tract was then dilated to 8 mm with an 8 mm x 8 cm Athletis balloon. A 8-10 mm by 7 + 2 mm of Viatorr endograft was placed. No post deployment balloon molding was performed. Next, the splenic vein was again cannulated and the large ectopic varix was selected. An assortment of 0.035"Azur coils were deployed. After coil deployment, there is persistent contrast flow through the varix. Therefore, a thick Gel-Foam slurry was mixed and administered in small aliquots under direct fluoroscopic visualization near the splenic aspect of the coil  pack. There is no evidence of Gel-Foam slurry passing through the coil pack. After injection of a proximally 5 cc, there was complete embolization of the ectopic varix. Completion portogram was performed which demonstrated a patent indwelling tips stent with brisk antegrade flow. In general, there was improved hepatopetal flow throughout the portal system. After placement of the shunt, right atrial and portal pressures were repeated. The catheters and sheath were removed and manual compression was applied to the right internal jugular and right common femoral venous access sites until hemostasis was achieved. The patient was transferred to the PACU in stable condition. Pre-TIPS Mean Pressures (mmHg): Right atrium: 6 Portal vein: 24 Portosystemic gradient: 18 Post-TIPS Mean Pressures (mmHg): Right atrium:15 Portal vein: 23 Portosystemic gradient: 8 IMPRESSION: 1. 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 2. Successful transjugular portosystemic shunt creation. 3. Successful coil embolization of ectopic varix/portosystemic shunt arising from the splenic vein. 4. Portosystemic gradient of 18 mm Hg (absolute portal venous pressure 24 mm Hg) before shunt placement and 8 mm Hg (absolute portal venous pressure 23 mm Hg) after shunt placement and coil embolization of ectopic varix. PLAN: Interventional Radiology will follow the patient as an inpatient. The Interventional Radiology Portal Hypertension Clinic will provide close follow-up after discharge. Ruthann Cancer, MD [facility] Radiology Electronically Signed   By: Ruthann Cancer M.D.   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 EXAM: 1. Ultrasound-guided paracentesis 2. Ultrasound-guided access of the right internal jugular vein 3. Ultrasound-guided access of the right common femoral vein 4. Hepatic venogram 5. Intravascular ultrasound 6. Catheterization of the portal vein 7. Portal venous and central manometry 8. Portal venogram 9. Creation of a transhepatic portal vein to hepatic vein shunt 10. Coil embolization of ectopic varix arising from the splenic vein MEDICATIONS: The patient was receiving intravenous antibiotics as an inpatient. ANESTHESIA/SEDATION: General - as administered by the Anesthesia department CONTRAST:  Eighty ML Omnipaque 300, intravenous FLUOROSCOPY TIME:  Fluoroscopy Time: 35.9 minutes (939 mGy). COMPLICATIONS: None immediate. PROCEDURE: Informed written consent was obtained from the patient after a thorough discussion of the procedural risks, benefits and alternatives. All questions were addressed. Maximal Sterile Barrier Technique was utilized including caps, mask, sterile gowns, sterile gloves, sterile drape,  hand hygiene and skin antiseptic. A timeout was performed prior to the initiation of the procedure. The procedure was performed with the assistance of my partner, Dr. Ronny Bacon. Preprocedure ultrasound evaluation demonstrated large volume ascites in the right lower quadrant. The procedure was planned. A small skin nick was made. Under direct ultrasound visualization, a 6 French Safe-T-Centesis needle was directed into the ascites. A total of 3.9 L of translucent, straw-colored fluid was removed throughout the course of the procedure. After drainage, the catheter was removed and a sterile bandage was placed. A preliminary ultrasound of the right groin was performed and demonstrates a patent right common femoral vein. A permanent ultrasound image was recorded. Using a combination of fluoroscopy and ultrasound, an access site was determined. A small  dermatotomy was made at the planned puncture site. Using ultrasound guidance, access into the right common femoral vein was obtained with visualization of needle entry into the vessel using a standard micropuncture technique. A wire was advanced into the IVC insert all fascial dilation performed. An 8 [place], 11 cm vascular sheath was placed into the external iliac vein. Through this access site, an 70 [place] ICE catheter was advanced with ease under fluoroscopic guidance to the level of the intrahepatic inferior vena cava. A preliminary ultrasound of the right neck was performed and demonstrates a patent internal jugular vein. A permanent ultrasound image was recorded. Using a combination of fluoroscopy and ultrasound, an access site was determined. A small dermatotomy was made at the planned puncture site. Using ultrasound guidance, access into the right internal jugular vein was obtained with visualization of needle entry into the vessel using a standard micropuncture technique. A wire was advanced into the IVC and serial fascial dilation performed. A 10 French tips sheath was placed into the internal jugular vein and advanced to the IVC. The jugular sheath was retracted into the right atrium and manometry was performed. A 5 French angled tip catheter was then directed into the middle hepatic vein. Hepatic venogram was performed. These images demonstrated patent hepatic vein with no stenosis. The catheter was advanced to a wedge portion of the a patent vein over which the 10 French sheath was advanced into the middle hepatic vein. Using ICE ultrasound visualization the catheter as middle hepatic vein as well as the portal anatomy was defined. A planned exit site from the hepatic vein and puncture site from the portal vein was placed into a single sonographic plane. Under direct ultrasound visualization, the ScorpionX needle was advanced into the central right portal vein. Hand injection of contrast  confirmed position within the portal system. A Glidewire Advantage was then advanced into the splenic vein. A 5 French marking pigtail catheter was then advanced over the wire into the main portal vein and wire removed. Portal venogram was performed which demonstrated a patent portal vein. There was a large varix arising from the splenic hilum which drained along the left flank and distributed into the left pelvic/left flank wound. Outflow is visualized within the left iliac vein. 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 Portal manometry was then performed. The tract was then dilated to 8 mm with an 8 mm x 8 cm Athletis balloon. A 8-10 mm by 7 + 2 mm of Viatorr endograft was placed. No post deployment balloon molding was performed. Next, the splenic vein was again cannulated and the large ectopic varix was selected. An assortment of 0.035"Azur coils were deployed. After coil deployment, there is persistent contrast flow  through the varix. Therefore, a thick Gel-Foam slurry was mixed and administered in small aliquots under direct fluoroscopic visualization near the splenic aspect of the coil pack. There is no evidence of Gel-Foam slurry passing through the coil pack. After injection of a proximally 5 cc, there was complete embolization of the ectopic varix. Completion portogram was performed which demonstrated a patent indwelling tips stent with brisk antegrade flow. In general, there was improved hepatopetal flow throughout the portal system. After placement of the shunt, right atrial and portal pressures were repeated. The catheters and sheath were removed and manual compression was applied to the right internal jugular and right common femoral venous access sites until hemostasis was achieved. The patient was transferred to the PACU in stable condition. Pre-TIPS Mean Pressures (mmHg): Right atrium: 6 Portal vein: 24 Portosystemic gradient: 18 Post-TIPS Mean Pressures (mmHg): Right  atrium:15 Portal vein: 23 Portosystemic gradient: 8 IMPRESSION: 1. 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 2. Successful transjugular portosystemic shunt creation. 3. Successful coil embolization of ectopic varix/portosystemic shunt arising from the splenic vein. 4. Portosystemic gradient of 18 mm Hg (absolute portal venous pressure 24 mm Hg) before shunt placement and 8 mm Hg (absolute portal venous pressure 23 mm Hg) after shunt placement and coil embolization of ectopic varix. PLAN: Interventional Radiology will follow the patient as an inpatient. The Interventional Radiology Portal Hypertension Clinic will provide close follow-up after discharge. Ruthann Cancer, MD [facility] Radiology Electronically Signed   By: Ruthann Cancer M.D.   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 EXAM: 1. Ultrasound-guided paracentesis 2. Ultrasound-guided access of the right internal jugular vein 3. Ultrasound-guided access of the right common femoral vein 4. Hepatic venogram 5. Intravascular ultrasound 6. Catheterization of the portal vein 7. Portal venous and central manometry 8. Portal venogram 9. Creation of a transhepatic portal vein to hepatic vein shunt 10. Coil embolization of ectopic varix arising from the splenic vein MEDICATIONS: The patient was receiving intravenous antibiotics as an inpatient. ANESTHESIA/SEDATION: General - as administered by the Anesthesia department CONTRAST:  Eighty ML Omnipaque 300, intravenous FLUOROSCOPY TIME:  Fluoroscopy Time: 35.9 minutes (939 mGy).  COMPLICATIONS: None immediate. PROCEDURE: Informed written consent was obtained from the patient after a thorough discussion of the procedural risks, benefits and alternatives. All questions were addressed. Maximal Sterile Barrier Technique was utilized including caps, mask, sterile gowns, sterile gloves, sterile drape, hand hygiene and skin antiseptic. A timeout was performed prior to the initiation of the procedure. The procedure was performed with the assistance of my partner, Dr. Ronny Bacon. Preprocedure ultrasound evaluation demonstrated large volume ascites in the right lower quadrant. The procedure was planned. A small skin nick was made. Under direct ultrasound visualization, a 6 French Safe-T-Centesis needle was directed into the ascites. A total of 3.9 L of translucent, straw-colored fluid was removed throughout the course of the procedure. After drainage, the catheter was removed and a sterile bandage was placed. A preliminary ultrasound of the right groin was performed and demonstrates a patent right common femoral vein. A permanent ultrasound image was recorded. Using a combination of fluoroscopy and ultrasound, an access  site was determined. A small dermatotomy was made at the planned puncture site. Using ultrasound guidance, access into the right common femoral vein was obtained with visualization of needle entry into the vessel using a standard micropuncture technique. A wire was advanced into the IVC insert all fascial dilation performed. An 8 [place], 11 cm vascular sheath was placed into the external iliac vein. Through this access site, an 32 [place] ICE catheter was advanced with ease under fluoroscopic guidance to the level of the intrahepatic inferior vena cava. A preliminary ultrasound of the right neck was performed and demonstrates a patent internal jugular vein. A permanent ultrasound image was recorded. Using a combination of fluoroscopy and ultrasound, an access site was determined.  A small dermatotomy was made at the planned puncture site. Using ultrasound guidance, access into the right internal jugular vein was obtained with visualization of needle entry into the vessel using a standard micropuncture technique. A wire was advanced into the IVC and serial fascial dilation performed. A 10 French tips sheath was placed into the internal jugular vein and advanced to the IVC. The jugular sheath was retracted into the right atrium and manometry was performed. A 5 French angled tip catheter was then directed into the middle hepatic vein. Hepatic venogram was performed. These images demonstrated patent hepatic vein with no stenosis. The catheter was advanced to a wedge portion of the a patent vein over which the 10 French sheath was advanced into the middle hepatic vein. Using ICE ultrasound visualization the catheter as middle hepatic vein as well as the portal anatomy was defined. A planned exit site from the hepatic vein and puncture site from the portal vein was placed into a single sonographic plane. Under direct ultrasound visualization, the ScorpionX needle was advanced into the central right portal vein. Hand injection of contrast confirmed position within the portal system. A Glidewire Advantage was then advanced into the splenic vein. A 5 French marking pigtail catheter was then advanced over the wire into the main portal vein and wire removed. Portal venogram was performed which demonstrated a patent portal vein. There was a large varix arising from the splenic hilum which drained along the left flank and distributed into the left pelvic/left flank wound. Outflow is visualized within the left iliac vein. 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 Portal manometry was then performed. The tract was then dilated to 8 mm with an 8 mm x 8 cm Athletis balloon. A 8-10 mm by 7 + 2 mm of Viatorr endograft was placed. No post deployment balloon molding was performed.  Next, the splenic vein was again cannulated and the large ectopic varix was selected. An assortment of 0.035"Azur coils were deployed. After coil deployment, there is persistent contrast flow through the varix. Therefore, a thick Gel-Foam slurry was mixed and administered in small aliquots under direct fluoroscopic visualization near the splenic aspect of the coil pack. There is no evidence of Gel-Foam slurry passing through the coil pack. After injection of a proximally 5 cc, there was complete embolization of the ectopic varix. Completion portogram was performed which demonstrated a patent indwelling tips stent with brisk antegrade flow. In general, there was improved hepatopetal flow throughout the portal system. After placement of the shunt, right atrial and portal pressures were repeated. The catheters and sheath were removed and manual compression was applied to the right internal jugular and right common femoral venous access sites until hemostasis was achieved. The patient was transferred to the PACU  in stable condition. Pre-TIPS Mean Pressures (mmHg): Right atrium: 6 Portal vein: 24 Portosystemic gradient: 18 Post-TIPS Mean Pressures (mmHg): Right atrium:15 Portal vein: 23 Portosystemic gradient: 8 IMPRESSION: 1. 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 2. Successful transjugular portosystemic shunt creation. 3. Successful coil embolization of ectopic varix/portosystemic shunt arising from the splenic vein. 4. Portosystemic gradient of 18 mm Hg (absolute portal venous pressure 24 mm Hg) before shunt placement and 8 mm Hg (absolute portal venous pressure 23 mm Hg) after shunt placement and coil embolization of ectopic varix. PLAN: Interventional Radiology will follow the patient as an inpatient. The Interventional Radiology Portal Hypertension Clinic will provide close follow-up after discharge. Ruthann Cancer, MD Vascular and Interventional  Radiology Specialists [facility] Radiology Electronically Signed   By: Ruthann Cancer M.D.   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 EXAM: 1. Ultrasound-guided paracentesis 2. Ultrasound-guided access of the right internal jugular vein 3. Ultrasound-guided access of the right common femoral vein 4. Hepatic venogram 5. Intravascular ultrasound 6. Catheterization of the portal vein 7. Portal venous and central manometry 8. Portal venogram 9. Creation of a transhepatic portal vein to hepatic vein shunt 10. Coil embolization of ectopic varix arising from the splenic vein MEDICATIONS: The patient was receiving intravenous antibiotics as an inpatient. ANESTHESIA/SEDATION: General - as administered by the Anesthesia department CONTRAST:  Eighty ML Omnipaque 300, intravenous FLUOROSCOPY TIME:  Fluoroscopy Time: 35.9 minutes (939 mGy). COMPLICATIONS: None immediate. PROCEDURE: Informed written consent was obtained from the patient after a thorough discussion of the procedural risks, benefits and alternatives. All questions were addressed. Maximal Sterile Barrier Technique was utilized including caps, mask, sterile gowns, sterile gloves, sterile drape, hand hygiene and skin antiseptic. A timeout was performed prior to the initiation of the procedure. The procedure was performed with the assistance of my partner, Dr. Ronny Bacon. Preprocedure ultrasound evaluation demonstrated large volume ascites in the right lower quadrant. The procedure was planned. A small skin nick was made. Under direct ultrasound visualization, a 6 French Safe-T-Centesis needle was directed into the ascites. A total of 3.9 L of translucent,  straw-colored fluid was removed throughout the course of the procedure. After drainage, the catheter was removed and a sterile bandage was placed. A preliminary ultrasound of the right groin was performed and demonstrates a patent right common femoral vein. A permanent ultrasound image was recorded. Using a combination of fluoroscopy and ultrasound, an access site was determined. A small dermatotomy was made at the planned puncture site. Using ultrasound guidance, access into the right common femoral vein was obtained with visualization of needle entry into the vessel using a standard micropuncture technique. A wire was advanced into the IVC insert all fascial dilation performed. An 8 [place], 11 cm vascular sheath was placed into the external iliac vein. Through this access site, an 51 [place] ICE catheter was advanced with ease under fluoroscopic guidance to the level of the intrahepatic inferior vena cava. A preliminary ultrasound of the right neck was performed and demonstrates a patent internal jugular vein. A permanent ultrasound image was recorded. Using a combination of fluoroscopy and ultrasound, an access site was determined. A small dermatotomy was made at the  planned puncture site. Using ultrasound guidance, access into the right internal jugular vein was obtained with visualization of needle entry into the vessel using a standard micropuncture technique. A wire was advanced into the IVC and serial fascial dilation performed. A 10 French tips sheath was placed into the internal jugular vein and advanced to the IVC. The jugular sheath was retracted into the right atrium and manometry was performed. A 5 French angled tip catheter was then directed into the middle hepatic vein. Hepatic venogram was performed. These images demonstrated patent hepatic vein with no stenosis. The catheter was advanced to a wedge portion of the a patent vein over which the 10 French sheath was advanced into the middle  hepatic vein. Using ICE ultrasound visualization the catheter as middle hepatic vein as well as the portal anatomy was defined. A planned exit site from the hepatic vein and puncture site from the portal vein was placed into a single sonographic plane. Under direct ultrasound visualization, the ScorpionX needle was advanced into the central right portal vein. Hand injection of contrast confirmed position within the portal system. A Glidewire Advantage was then advanced into the splenic vein. A 5 French marking pigtail catheter was then advanced over the wire into the main portal vein and wire removed. Portal venogram was performed which demonstrated a patent portal vein. There was a large varix arising from the splenic hilum which drained along the left flank and distributed into the left pelvic/left flank wound. Outflow is visualized within the left iliac vein. 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 Portal manometry was then performed. The tract was then dilated to 8 mm with an 8 mm x 8 cm Athletis balloon. A 8-10 mm by 7 + 2 mm of Viatorr endograft was placed. No post deployment balloon molding was performed. Next, the splenic vein was again cannulated and the large ectopic varix was selected. An assortment of 0.035"Azur coils were deployed. After coil deployment, there is persistent contrast flow through the varix. Therefore, a thick Gel-Foam slurry was mixed and administered in small aliquots under direct fluoroscopic visualization near the splenic aspect of the coil pack. There is no evidence of Gel-Foam slurry passing through the coil pack. After injection of a proximally 5 cc, there was complete embolization of the ectopic varix. Completion portogram was performed which demonstrated a patent indwelling tips stent with brisk antegrade flow. In general, there was improved hepatopetal flow throughout the portal system. After placement of the shunt, right atrial and portal  pressures were repeated. The catheters and sheath were removed and manual compression was applied to the right internal jugular and right common femoral venous access sites until hemostasis was achieved. The patient was transferred to the PACU in stable condition. Pre-TIPS Mean Pressures (mmHg): Right atrium: 6 Portal vein: 24 Portosystemic gradient: 18 Post-TIPS Mean Pressures (mmHg): Right atrium:15 Portal vein: 23 Portosystemic gradient: 8 IMPRESSION: 1. 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 2. Successful transjugular portosystemic shunt creation. 3. Successful coil embolization of ectopic varix/portosystemic shunt arising from the splenic vein. 4. Portosystemic gradient of 18 mm Hg (absolute portal venous pressure 24 mm Hg) before shunt placement and 8 mm Hg (absolute portal venous pressure 23 mm Hg) after shunt placement and coil embolization of ectopic varix. PLAN: Interventional Radiology will follow the patient as an inpatient. The Interventional Radiology Portal Hypertension Clinic will provide close follow-up after discharge. Ruthann Cancer, MD Vascular and Interventional  Radiology Specialists [facility] Radiology Electronically Signed   By: Ruthann Cancer M.D.   On: 05/10/2022 15:50   US THORACENTESIS ASP PLEURAL SPACE W/IMG GUIDE  Result Date: 04/23/2022 INDICATION: Patient with history of cirrhosis, hepatic hydrothorax, ascites; request received for therapeutic right thoracentesis. EXAM: ULTRASOUND GUIDED THERAPEUTIC RIGHT THORACENTESIS MEDICATIONS: 10 mL 1% lidocaine COMPLICATIONS: None immediate. PROCEDURE: An ultrasound guided thoracentesis was thoroughly discussed with the patient and questions answered. The benefits, risks, alternatives and complications were also discussed. The patient understands and wishes to proceed with the procedure. Written consent was obtained. Ultrasound was performed to localize and mark an adequate  pocket of fluid in the right chest. The area was then prepped and draped in the normal sterile fashion. 1% Lidocaine was used for local anesthesia. Under ultrasound guidance a 6 Fr Safe-T-Centesis catheter was introduced. Thoracentesis was performed. The catheter was removed and a dressing applied. FINDINGS: A total of approximately 1 liter of golden yellow fluid was removed. IMPRESSION: Successful ultrasound guided therapeutic right thoracentesis yielding 1 liter of pleural fluid. Read by: Rowe Robert, PA-C Electronically Signed   By: Ruthann Cancer M.D.   On: 04/23/2022 12:12   DG Chest 1 View  Result Date: 04/23/2022 CLINICAL DATA:  [id number]; right thoracentesis yielding 1 L of golden yellow fluid EXAM: CHEST  1 VIEW COMPARISON:  May 09, 2022 FINDINGS: The heart size and mediastinal contours are within normal limits. Low lung volumes. Both lungs are clear. No right pneumothorax. The visualized skeletal structures are unremarkable. IMPRESSION: Low lung volumes.  No right pneumothorax.  Lungs are clear. Electronically Signed   By: Frazier Richards M.D.   On: 04/23/2022 12:05   CT Angio Abd/Pel w/ and/or w/o  Result Date: 04/20/2022 CLINICAL DATA:  Preop planning for TIPS EXAM: CTA ABDOMEN AND PELVIS WITHOUT AND WITH CONTRAST TECHNIQUE: Multidetector CT imaging of the abdomen and pelvis was performed using the standard protocol during bolus administration of intravenous contrast. Multiplanar reconstructed images and MIPs were obtained and reviewed to evaluate the vascular anatomy. RADIATION DOSE REDUCTION: This exam was performed according to the departmental dose-optimization program which includes automated exposure control, adjustment of the mA and/or kV according to patient size and/or use of iterative reconstruction technique. CONTRAST:  178m OMNIPAQUE IOHEXOL 350 MG/ML SOLN COMPARISON:  06/01/2021 FINDINGS: VASCULAR Coronary calcifications. Patent hepatic veins, portal vein, SM V splenic vein Aorta: Mild  scattered calcified atheromatous plaque. No aneurysm, dissection, or stenosis. Celiac: Patent without evidence of aneurysm, dissection, vasculitis or significant stenosis. SMA: Patent without evidence of aneurysm, dissection, vasculitis or significant stenosis. Replaced right hepatic arterial supply, an anatomic variant. Renals: Duplicated left, inferior dominant, both patent. Single right, widely patent. IMA: Patent without evidence of aneurysm, dissection, vasculitis or significant stenosis. Inflow: Mild nonocclusive calcified plaque. No aneurysm or dissection. Proximal Outflow: Patent, with extensive medial calcifications Veins: Patent hepatic veins, portal vein, splenic vein, SMV. Enlarged umbilical venous collaterals. Enlarged left mesenteric portosystemic collateral. No significant splenorenal shunt. No evident gastric varices. A few small submucosal esophageal varices. Review of the MIP images confirms the above findings. NON-VASCULAR Lower chest: Small left and moderate right pleural effusions. Dependent atelectasis posteriorly in the lung bases. Subpleural fibrotic changes in the visualized lung bases. Hepatobiliary: Small liver with a nodular contour consistent with cirrhosis. Few scattered stable hepatic cysts, largest 1.3 cm in segment 4. No new liver lesion or biliary ductal dilatation. Cholecystectomy clips. Pancreas: Unremarkable. No pancreatic ductal dilatation or surrounding inflammatory changes. Spleen: Splenomegaly without focal lesion. Adrenals/Urinary Tract:  No adrenal mass. Symmetric renal parenchymal enhancement without focal lesion or hydronephrosis. Urinary bladder is physiologically distended. Stomach/Bowel: Partially distended by ingested material, unremarkable. Duodenal diverticulum. Small bowel is nondilated. Appendix not discretely identified. The colon is partially distended with fecal material. Multiple diverticula from descending and sigmoid segments. Lymphatic: No abdominal or  pelvic adenopathy. Reproductive: Prostate is unremarkable. Other: Moderate pelvic ascites.  No free air. Musculoskeletal: Chronic fracture deformity of left iliac wing with herniation of retroperitoneal fat, ascites, and venous collaterals into the deep subcutaneous tissues. Spondylitic changes in the lower thoracic and lumbar spine. No acute findings. IMPRESSION: 1. No acute findings. 2. Patent hepatic and portal veins. 3. Umbilical and left mesenteric portosystemic venous collaterals. No significant splenorenal shunt. 4. Moderate ascites, bilateral pleural effusions right greater than left. 5. Descending and sigmoid diverticulosis. Electronically Signed   By: Lucrezia Europe M.D.   On: 04/20/2022 17:06   US Paracentesis  Result Date: 04/19/2022 INDICATION: History of cirrhosis with recurrent ascites. Patient was scheduled for elective tips on 03/17/2022. The procedure was aborted due to elevated right heart pressure right heart pressure remained elevated even after right thoracentesis and paracentesis was performed, procedure was aborted. Request for diagnostic paracentesis. EXAM: ULTRASOUND GUIDED DIAGNOSTIC  RIGHT LOWER QUADRANT PARACENTESIS MEDICATIONS: 10 mL 1 % lidocaine COMPLICATIONS: None immediate. PROCEDURE: Informed written consent was obtained from the patient after a discussion of the risks, benefits and alternatives to treatment. A timeout was performed prior to the initiation of the procedure. Initial ultrasound scanning demonstrates a large amount of ascites within the right lower abdominal quadrant. The right lower abdomen was prepped and draped in the usual sterile fashion. 1% lidocaine was used for local anesthesia. Following this, a 19 gauge, 7-cm, Yueh catheter was introduced. An ultrasound image was saved for documentation purposes. The paracentesis was performed. The catheter was removed and a dressing was applied. The patient tolerated the procedure well without immediate post procedural  complication. Patient received post-procedure intravenous albumin; see nursing notes for details. FINDINGS: A total of approximately 3 L of clear, golden colored fluid was removed. Samples were sent to the laboratory as requested by the clinical team. IMPRESSION: Successful ultrasound-guided paracentesis yielding 3 liters of peritoneal fluid. Read by: Narda Rutherford, AGNP-BC PLAN: The patient has previously been evaluated by the [facility] Interventional Radiology Portal Hypertension Clinic, and deemed not a candidate for intervention. Electronically Signed   By: Jacqulynn Cadet M.D.   On: 04/19/2022 13:28   VAS [place] LOWER EXTREMITY VENOUS (DVT) (7a-7p)  Result Date: 04/19/2022  Lower Venous DVT Study Patient Name:  VITTORIO MOHS  Date of Exam:   05/06/2022 Medical Rec #: [id number]        Accession #:    [id number] Date of Birth: [birth year]         Patient Gender: M Patient Age:   57 years Exam Location:  [facility] Procedure:      VAS [place] LOWER EXTREMITY VENOUS (DVT) Referring Phys: Ova Freshwater FONDAW --------------------------------------------------------------------------------  Indications: Pain.  Risk Factors: None identified. Limitations: Poor ultrasound/tissue interface. Comparison Study: No prior studies. Performing Technologist: Oliver Hum RVT  Examination Guidelines: A complete evaluation includes B-mode imaging, spectral Doppler, color Doppler, and power Doppler as needed of all accessible portions of each vessel. Bilateral testing is considered an integral part of a complete examination. Limited examinations for reoccurring indications may be performed as noted. The reflux portion of the exam is performed with the patient in reverse Trendelenburg.  +-----+---------------+---------+-----------+----------+--------------+ RIGHTCompressibilityPhasicitySpontaneityPropertiesThrombus Aging +-----+---------------+---------+-----------+----------+--------------+  CFV  Full           Yes      Yes                                  +-----+---------------+---------+-----------+----------+--------------+   +---------+---------------+---------+-----------+----------+-----------------+ LEFT     CompressibilityPhasicitySpontaneityPropertiesThrombus Aging    +---------+---------------+---------+-----------+----------+-----------------+ CFV      Full           Yes      Yes                                    +---------+---------------+---------+-----------+----------+-----------------+ SFJ      Full                                                           +---------+---------------+---------+-----------+----------+-----------------+ FV Prox  Full                                                           +---------+---------------+---------+-----------+----------+-----------------+ FV Mid   Full                                                           +---------+---------------+---------+-----------+----------+-----------------+ FV DistalFull                                                           +---------+---------------+---------+-----------+----------+-----------------+ PFV      Full                                                           +---------+---------------+---------+-----------+----------+-----------------+ POP      Full           Yes      Yes                                    +---------+---------------+---------+-----------+----------+-----------------+ PTV      Full                                                           +---------+---------------+---------+-----------+----------+-----------------+ PERO     Full                                                           +---------+---------------+---------+-----------+----------+-----------------+  SSV      None                                         Age Indeterminate +---------+---------------+---------+-----------+----------+-----------------+ The thrombus noted in  the short saphenous vein extends from the proximal calf into the confluence with the popliteal vein. The thrombus does not appear to extend into the popliteal vein.    Summary: RIGHT: - No evidence of common femoral vein obstruction.  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 - There is no evidence of deep vein thrombosis in the lower extremity.  - No cystic structure found in the popliteal fossa. - The thrombus noted in the short saphenous vein extends from the proximal calf into the confluence with the popliteal vein. The thrombus does not appear to extend into the popliteal vein.  *See table(s) above for measurements and observations. Electronically signed by Jamelle Haring on 04/19/2022 at 12:21:05 PM.    Final    ECHOCARDIOGRAM COMPLETE  Result Date: 04/19/2022    ECHOCARDIOGRAM REPORT   Patient Name:   HESSTON HITCHENS Date of Exam: 04/19/2022 Medical Rec #:  [id number]       Height:       69.0 in Accession #:    [id number]      Weight:       161.6 lb Date of Birth:  04-17-1946        BSA:          1.887 m Patient Age:    32 years        BP:           135/54 mmHg Patient Gender: M               HR:           89 bpm. Exam Location:  Inpatient Procedure: 2D Echo, Cardiac Doppler and Color Doppler Indications:    R06.02 SOB  History:        Patient has prior history of Echocardiogram examinations, most                 recent 03/06/2022. Risk Factors:Hypertension and Dyslipidemia.                 ETOH.  Sonographer:    Roseanna Rainbow RDCS Referring Phys: [id number] Hortencia Conradi [facility]  Sonographer Comments: Technically difficult study due to poor echo windows. Image acquisition challenging due to respiratory motion. Diffucult windows, patient could not raise left arm adaquately. Attempted to turn IMPRESSIONS  1. Left ventricular ejection fraction, by estimation, is 60 to 65%. The left ventricle has normal function. The left ventricle has no regional wall  motion abnormalities. Left ventricular diastolic parameters were normal.  2. Right ventricular systolic function is normal. The right ventricular size is normal.  3. Left atrial size was mildly dilated.  4. The mitral valve is normal in structure. No evidence of mitral valve regurgitation. No evidence of mitral stenosis.  5. The aortic valve was not well visualized. There is mild calcification of the aortic valve. There is mild thickening of the aortic valve. Aortic valve regurgitation is not visualized. Aortic valve sclerosis is present, with no evidence of aortic valve  stenosis.  6. The inferior vena cava is dilated in size with >50% respiratory variability, suggesting right atrial pressure of 8 mmHg. FINDINGS  Left  Ventricle: Left ventricular ejection fraction, by estimation, is 60 to 65%. The left ventricle has normal function. The left ventricle has no regional wall motion abnormalities. The left ventricular internal cavity size was normal in size. There is  no left ventricular hypertrophy. Left ventricular diastolic parameters were normal. Right Ventricle: The right ventricular size is normal. No increase in right ventricular wall thickness. Right ventricular systolic function is normal. Left Atrium: Left atrial size was mildly dilated. Right Atrium: Right atrial size was normal in size. Pericardium: There is no evidence of pericardial effusion. Mitral Valve: The mitral valve is normal in structure. No evidence of mitral valve regurgitation. No evidence of mitral valve stenosis. Tricuspid Valve: The tricuspid valve is normal in structure. Tricuspid valve regurgitation is mild . No evidence of tricuspid stenosis. Aortic Valve: The aortic valve was not well visualized. There is mild calcification of the aortic valve. There is mild thickening of the aortic valve. Aortic valve regurgitation is not visualized. Aortic valve sclerosis is present, with no evidence of aortic valve stenosis. Pulmonic Valve: The  pulmonic valve was normal in structure. Pulmonic valve regurgitation is not visualized. No evidence of pulmonic stenosis. Aorta: The aortic root is normal in size and structure. Venous: The inferior vena cava is dilated in size with greater than 50% respiratory variability, suggesting right atrial pressure of 8 mmHg. IAS/Shunts: No atrial level shunt detected by color flow Doppler.  LEFT VENTRICLE PLAX 2D LVIDd:         4.70 cm     Diastology LVIDs:         2.70 cm     LV e' medial:    9.90 cm/s LV PW:         1.00 cm     LV E/e' medial:  7.6 LV IVS:        0.90 cm     LV e' lateral:   7.94 cm/s LVOT diam:     2.40 cm     LV E/e' lateral: 9.5 LV SV:         106 LV SV Index:   56 LVOT Area:     4.52 cm  LV Volumes (MOD) LV vol d, MOD A2C: 64.0 ml LV vol d, MOD A4C: 92.1 ml LV vol s, MOD A2C: 15.6 ml LV vol s, MOD A4C: 20.3 ml LV SV MOD A2C:     48.4 ml LV SV MOD A4C:     92.1 ml LV SV MOD BP:      58.9 ml RIGHT VENTRICLE             IVC RV S prime:     28.90 cm/s  IVC diam: 2.30 cm TAPSE (M-mode): 2.7 cm LEFT ATRIUM             Index        RIGHT ATRIUM           Index LA diam:        4.10 cm 2.17 cm/m   RA Area:     11.80 cm LA Vol (A2C):   43.0 ml 22.79 ml/m  RA Volume:   23.10 ml  12.24 ml/m LA Vol (A4C):   39.8 ml 21.09 ml/m LA Biplane Vol: 40.9 ml 21.67 ml/m  AORTIC VALVE LVOT Vmax:   117.00 cm/s LVOT Vmean:  77.700 cm/s LVOT VTI:    0.234 m  AORTA Ao Root diam: 3.70 cm Ao Asc diam:  3.20 cm MITRAL VALVE MV Area (PHT): 3.88 cm  SHUNTS MV Decel Time: 196 msec    Systemic VTI:  0.23 m MV E velocity: 75.50 cm/s  Systemic Diam: 2.40 cm MV A velocity: 95.05 cm/s MV E/A ratio:  0.79 Jenkins Rouge MD Electronically signed by Jenkins Rouge MD Signature Date/Time: 04/19/2022/11:36:58 AM    Final    DG CHEST PORT 1 VIEW  Result Date: 04/19/2022 CLINICAL DATA:  76 year old male with history of hypotension. Lactic acidosis. EXAM: PORTABLE CHEST 1 VIEW COMPARISON:  Chest x-ray 05/09/2022. FINDINGS: Skin fold  artifact projecting over the lower left hemithorax. Lung volumes are low. No consolidative airspace disease. No pleural effusions. No pneumothorax. No pulmonary nodule or mass noted. Pulmonary vasculature and the cardiomediastinal silhouette are within normal limits. IMPRESSION: 1. Low lung volumes without radiographic evidence of acute cardiopulmonary disease. Electronically Signed   By: Vinnie Langton M.D.   On: 04/19/2022 07:28   DG Chest 2 View  Result Date: 04/20/2022 CLINICAL DATA:  Generalized weakness since this morning, cirrhosis post paracentesis yesterday, diuretics increased yesterday, patient reports up all night urinating. History hypertension, NASH, GERD EXAM: CHEST - 2 VIEW COMPARISON:  04/15/2022 FINDINGS: Normal heart size, mediastinal contours, and pulmonary vascularity. Lungs clear. No pulmonary infiltrate, pleural effusion, or pneumothorax. Scattered endplate spur formation thoracic spine. IMPRESSION: No acute abnormalities. Electronically Signed   By: Lavonia Dana M.D.   On: 04/30/2022 15:13   IR Paracentesis  Result Date: 04/17/2022 INDICATION: Patient with history of cirrhosis with recurrent ascites. TIPS procedure on 5.30.23 aborted due to elevated right heart pressures. EXAM: ULTRASOUND GUIDED THERAPEUTIC PARACENTESIS MEDICATIONS: Lidocaine 1% 10 mL COMPLICATIONS: None immediate. PROCEDURE: Informed written consent was obtained from the patient after a discussion of the risks, benefits and alternatives to treatment. A timeout was performed prior to the initiation of the procedure. Initial ultrasound scanning demonstrates a moderate amount of ascites within the right lower abdominal quadrant. The right lower abdomen was prepped and draped in the usual sterile fashion. 1% lidocaine was used for local anesthesia. Following this, a 19 gauge, 7-cm, Yueh catheter was introduced. An ultrasound image was saved for documentation purposes. The paracentesis was performed. The catheter was  removed and a dressing was applied. The patient tolerated the procedure well without immediate post procedural complication. Patient received post-procedure intravenous albumin; see nursing notes for details. FINDINGS: A total of approximately 4.5 of straw-colored fluid was removed. IMPRESSION: Successful ultrasound-guided therapeutic paracentesis yielding 4.5 liters of peritoneal fluid. Read by: Rushie Nyhan, NP Electronically Signed   By: Miachel Roux M.D.   On: 04/17/2022 16:16   IR THORACENTESIS ASP PLEURAL SPACE W/IMG GUIDE  Result Date: 04/15/2022 INDICATION: Alcoholic cirrhosis, recurrent ascites EXAM: ULTRASOUND GUIDED RIGHT THORACENTESIS MEDICATIONS: None. COMPLICATIONS: None immediate. PROCEDURE: An ultrasound guided thoracentesis was thoroughly discussed with the patient and questions answered. The benefits, risks, alternatives and complications were also discussed. The patient understands and wishes to proceed with the procedure. Written consent was obtained. Ultrasound was performed to localize and mark an adequate pocket of fluid in the right chest. The area was then prepped and draped in the normal sterile fashion. 1% Lidocaine was used for local anesthesia. Under ultrasound guidance a 6 Fr Safe-T-Centesis catheter was introduced. Thoracentesis was performed. The catheter was removed and a dressing applied. FINDINGS: A total of approximately 800cc of amber pleural fluid was removed. IMPRESSION: Successful ultrasound guided right thoracentesis yielding 800cc of pleural fluid. Electronically Signed   By: Lucrezia Europe M.D.   On: 04/15/2022 14:38   DG Chest 1 View  Result Date: 04/15/2022 CLINICAL DATA:  Status post right thoracentesis EXAM: CHEST  1 VIEW COMPARISON:  Multiple prior chest radiographs FINDINGS: Unchanged cardiomediastinal silhouette. Trace right pleural effusion, decreased from prior. No new airspace disease. No pneumothorax. No acute osseous abnormality. IMPRESSION: No  pneumothorax after thoracentesis. Electronically Signed   By: Maurine Simmering M.D.   On: 04/15/2022 14:17    Microbiology: No results found for this or any previous visit (from the past 240 hour(s)).  Time spent: 35 minutes  Signed: Oren Binet, MD 05-12-2022

## 2022-05-19 DEATH — deceased

## 2022-07-09 IMAGING — US IR PARACENTESIS
1 series · 2 of 2 positions shown · non-contrast
Comparison: none

INDICATION: Patient with history of alcoholic cirrhosis with recurrent ascites.
Request received for therapeutic paracentesis.

[Series 1: us paracentesis mc & wl · 2 of 2 slices shown]
[im 1/2]
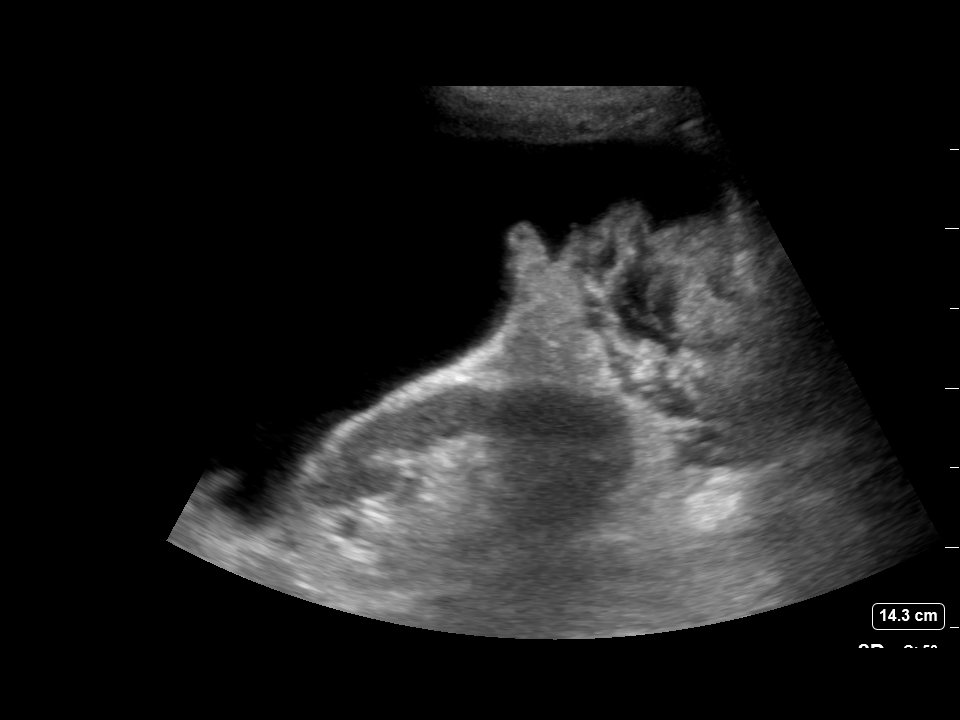
[im 2/2]
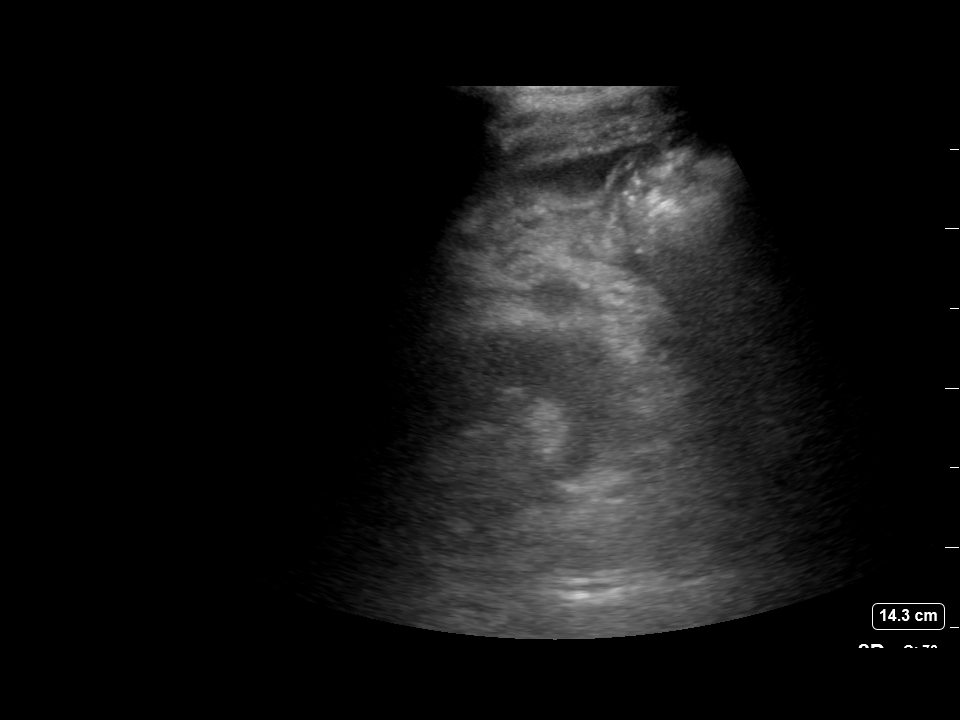

[2 of 2 positions shown; findings below may reference images not displayed]

EXAM:
ULTRASOUND GUIDED THERAPEUTIC RIGHT UPPER QUADRANT PARACENTESIS

MEDICATIONS:
10 mL 1 % lidocaine

COMPLICATIONS:
None immediate.

PROCEDURE:
Informed written consent was obtained from the patient after a
discussion of the risks, benefits and alternatives to treatment. A
timeout was performed prior to the initiation of the procedure.

Initial ultrasound scanning demonstrates a large amount of ascites
within the right upper abdominal quadrant. The right upper abdomen
was prepped and draped in the usual sterile fashion. 1% lidocaine
was used for local anesthesia.

Following this, a 19 gauge, 7-cm, Yueh catheter was introduced. An
ultrasound image was saved for documentation purposes. The
paracentesis was performed. The catheter was removed and a dressing
was applied. The patient tolerated the procedure well without
immediate post procedural complication.
Patient received post-procedure intravenous albumin; see nursing
notes for details.
FINDINGS: A total of approximately 3.3 L of clear, yellow fluid was removed.
IMPRESSION: Successful ultrasound-guided paracentesis yielding 3.3 liters of
peritoneal fluid.

PLAN:
If the patient eventually requires >/=2 paracenteses in a 30 day
period, candidacy for formal evaluation by the [HOSPITAL]
assessed.

## 2022-07-30 IMAGING — US US PARACENTESIS
1 series · 7 of 7 positions shown · non-contrast
Comparison: none

INDICATION: Patient with history of alcoholic cirrhosis, portal hypertension,
varices, recurrent ascites. Request received for therapeutic
paracentesis.

[Series 1: us paracentesis mc & wl · 7 of 7 slices shown]
[im 1/7]
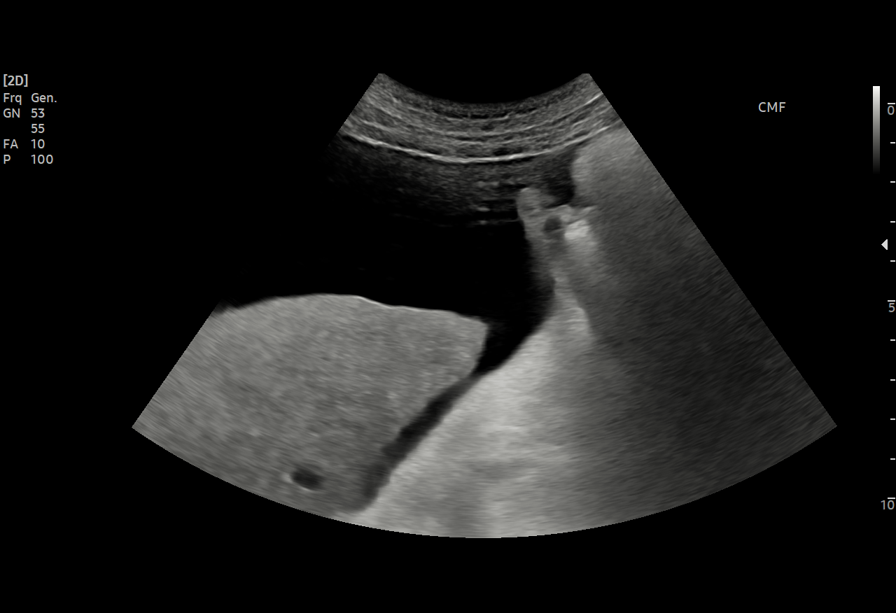
[im 2/7]
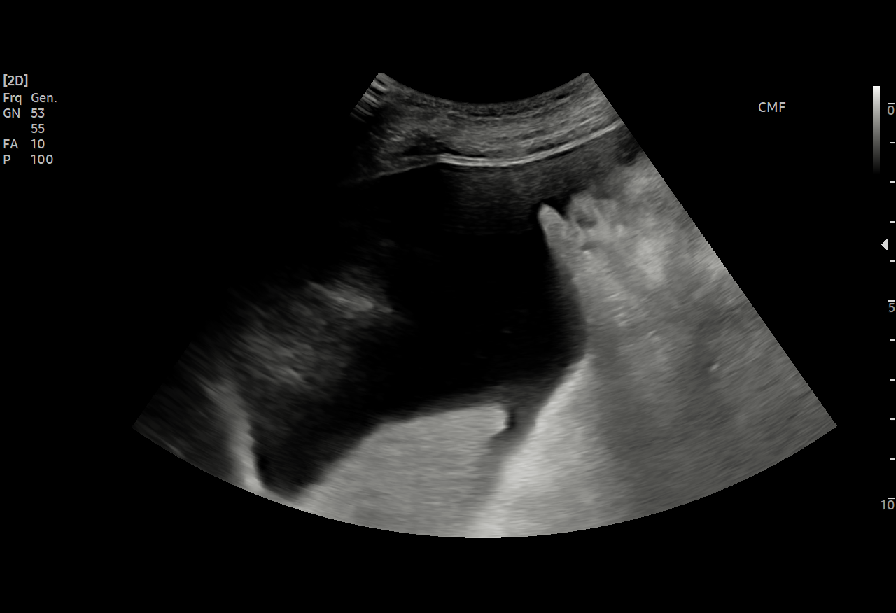
[im 3/7]
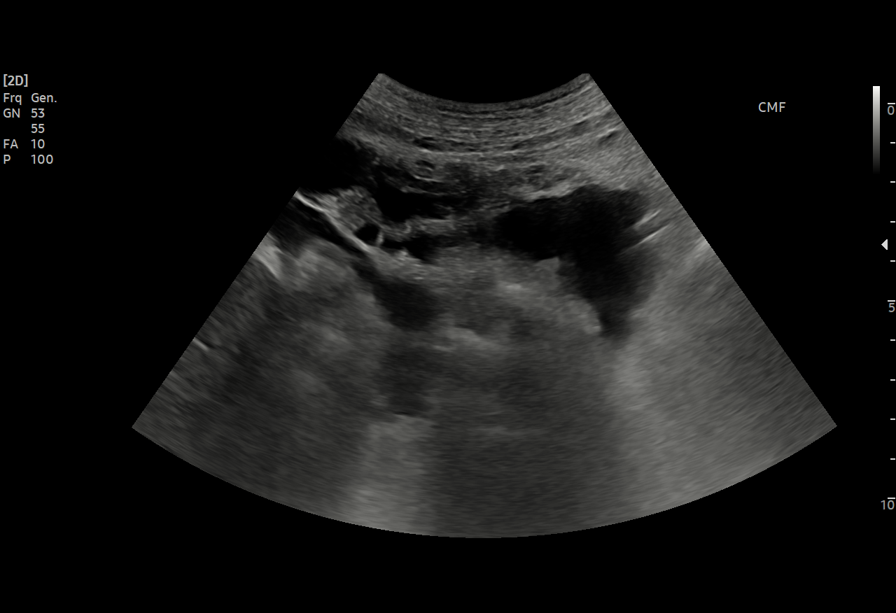
[im 4/7]
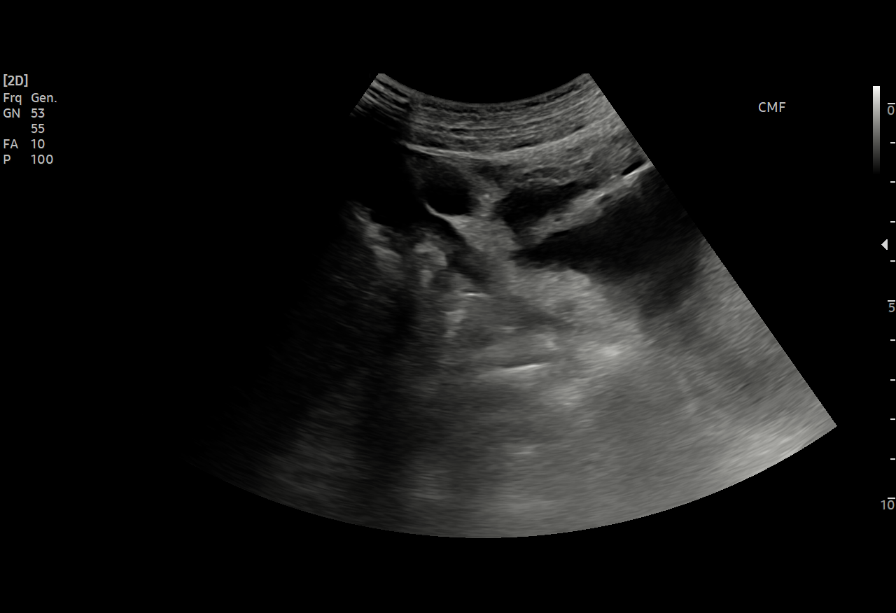
[im 5/7]
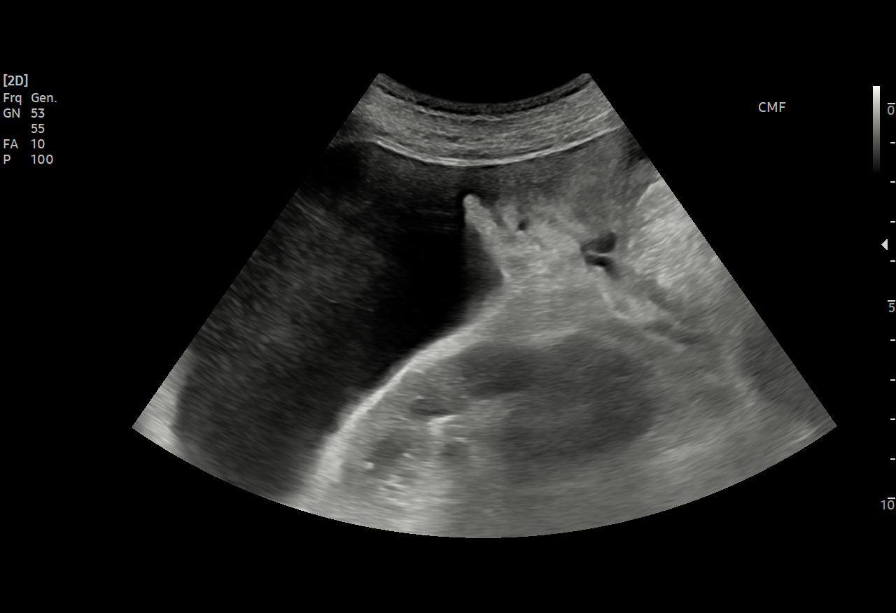
[im 6/7]
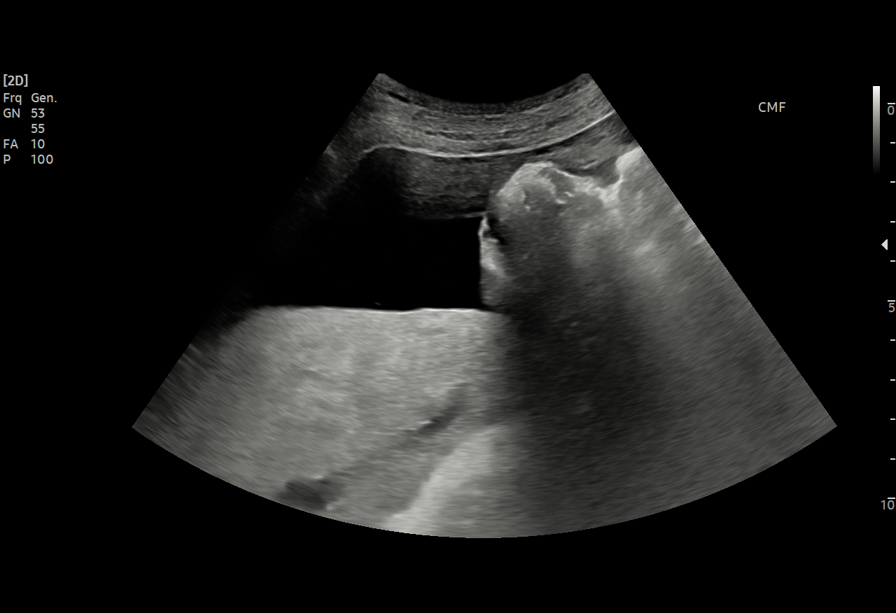
[im 7/7]
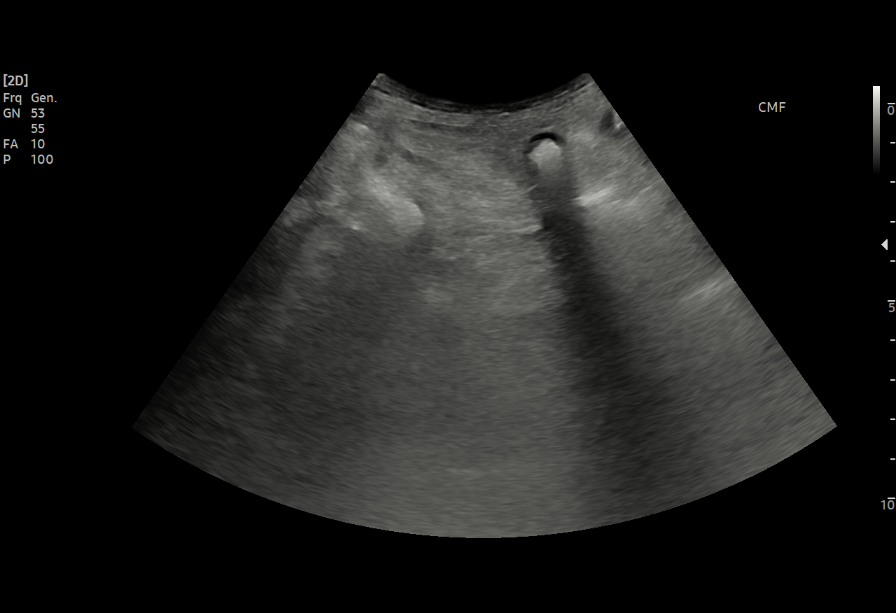

[7 of 7 positions shown; findings below may reference images not displayed]

EXAM:
ULTRASOUND GUIDED THERAPEUTIC PARACENTESIS

MEDICATIONS:
8 mL 1% lidocaine

COMPLICATIONS:
None immediate.

PROCEDURE:
Informed written consent was obtained from the patient after a
discussion of the risks, benefits and alternatives to treatment. A
timeout was performed prior to the initiation of the procedure.

Initial ultrasound scanning demonstrates a moderate amount of
ascites within the right lower abdominal quadrant. The right lower
abdomen was prepped and draped in the usual sterile fashion. 1%
lidocaine was used for local anesthesia.

Following this, a 19 gauge, 7-cm, Yueh catheter was introduced. An
ultrasound image was saved for documentation purposes. The
paracentesis was performed. The catheter was removed and a dressing
was applied. The patient tolerated the procedure well without
immediate post procedural complication.
FINDINGS: A total of approximately 3.4 liters of yellow fluid was removed.
IMPRESSION: Successful ultrasound-guided therapeutic paracentesis yielding
liters of peritoneal fluid.

## 2022-08-11 IMAGING — US IR TIPS
1 series · 4 of 4 positions shown · non-contrast
Comparison: none

CLINICAL DATA: 76-year-old gentleman with history of recurrent
ascites, newly developed hepatic hydrothorax, and large left
retroperitoneal varix presented to IR for TIPS.

[Series 1: ir tips · 4 of 4 slices shown]
[im 1/4]
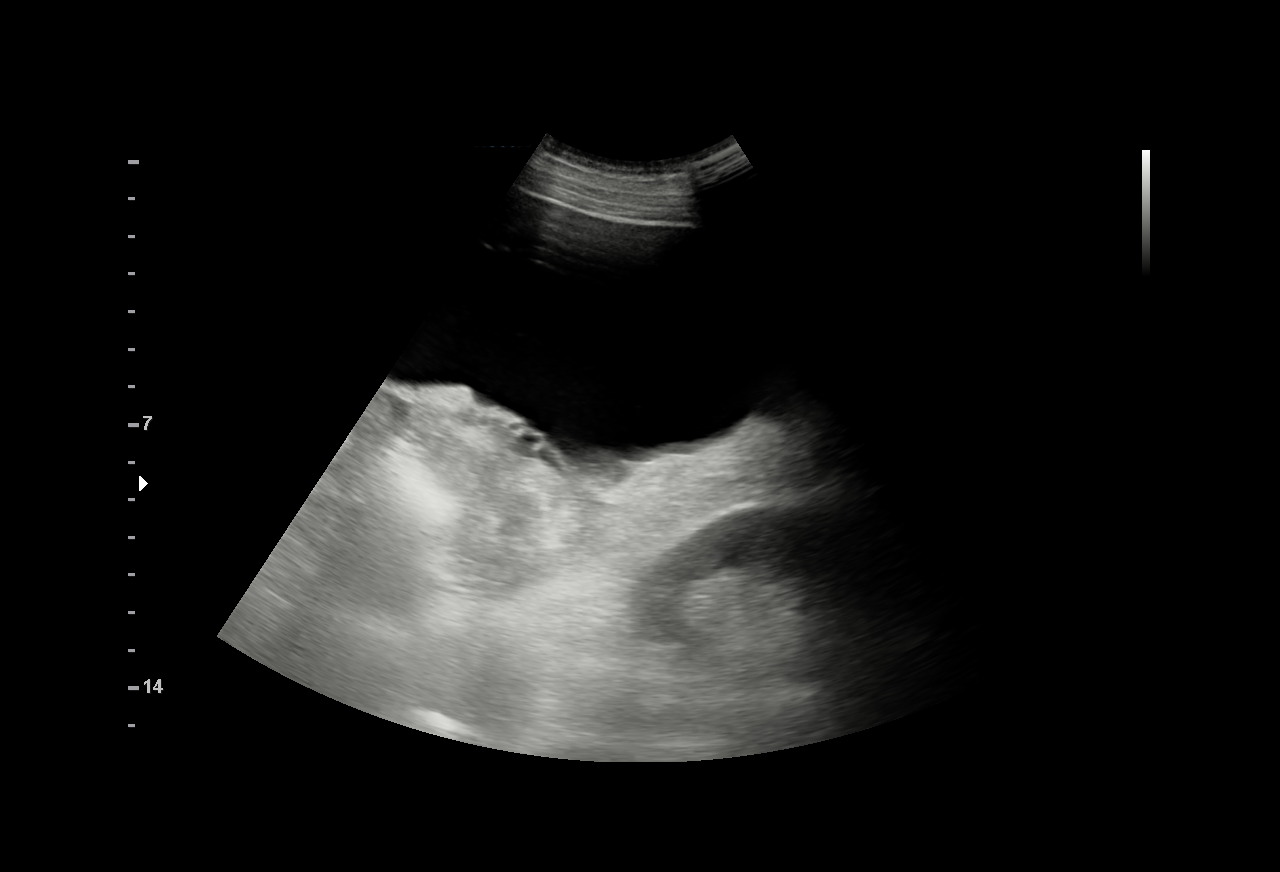
[im 2/4]
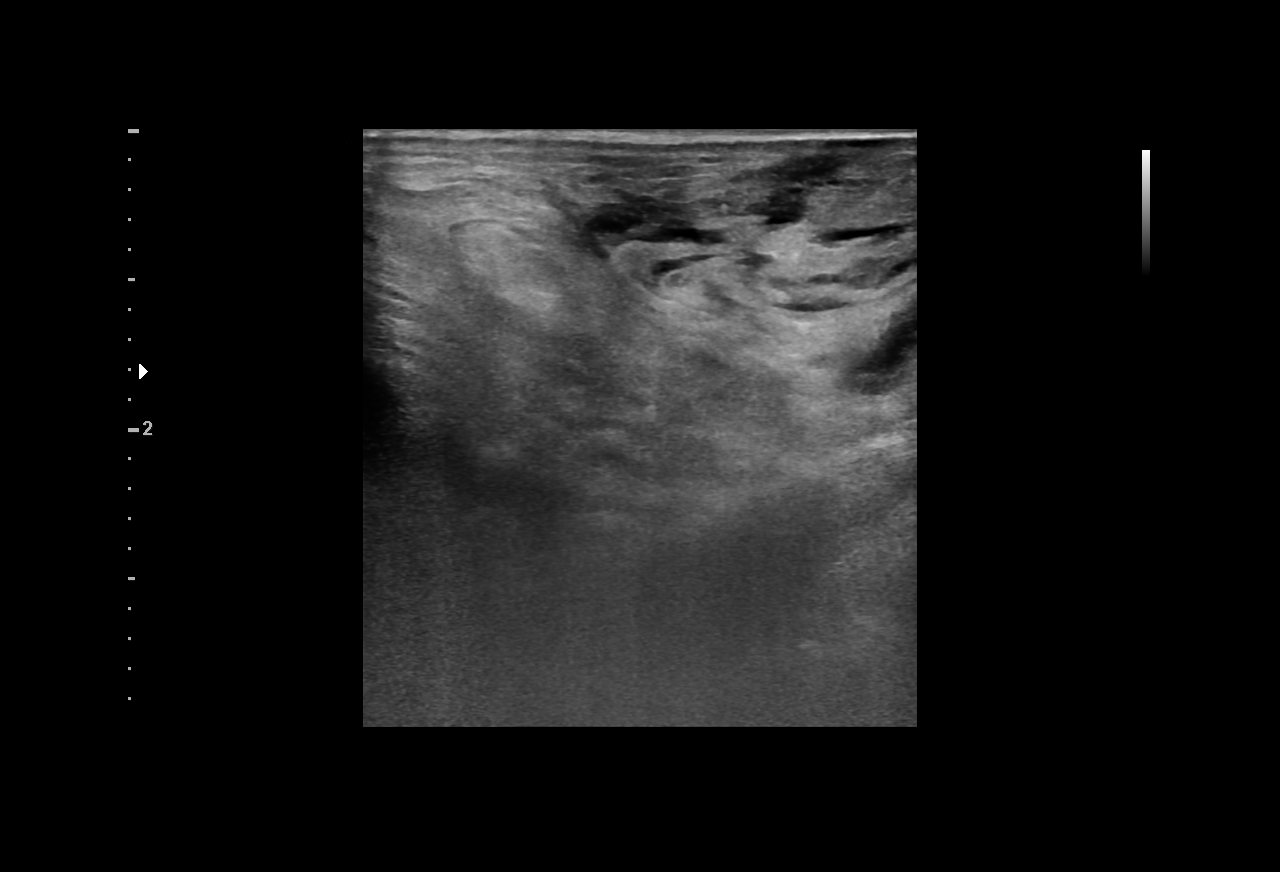
[im 3/4]
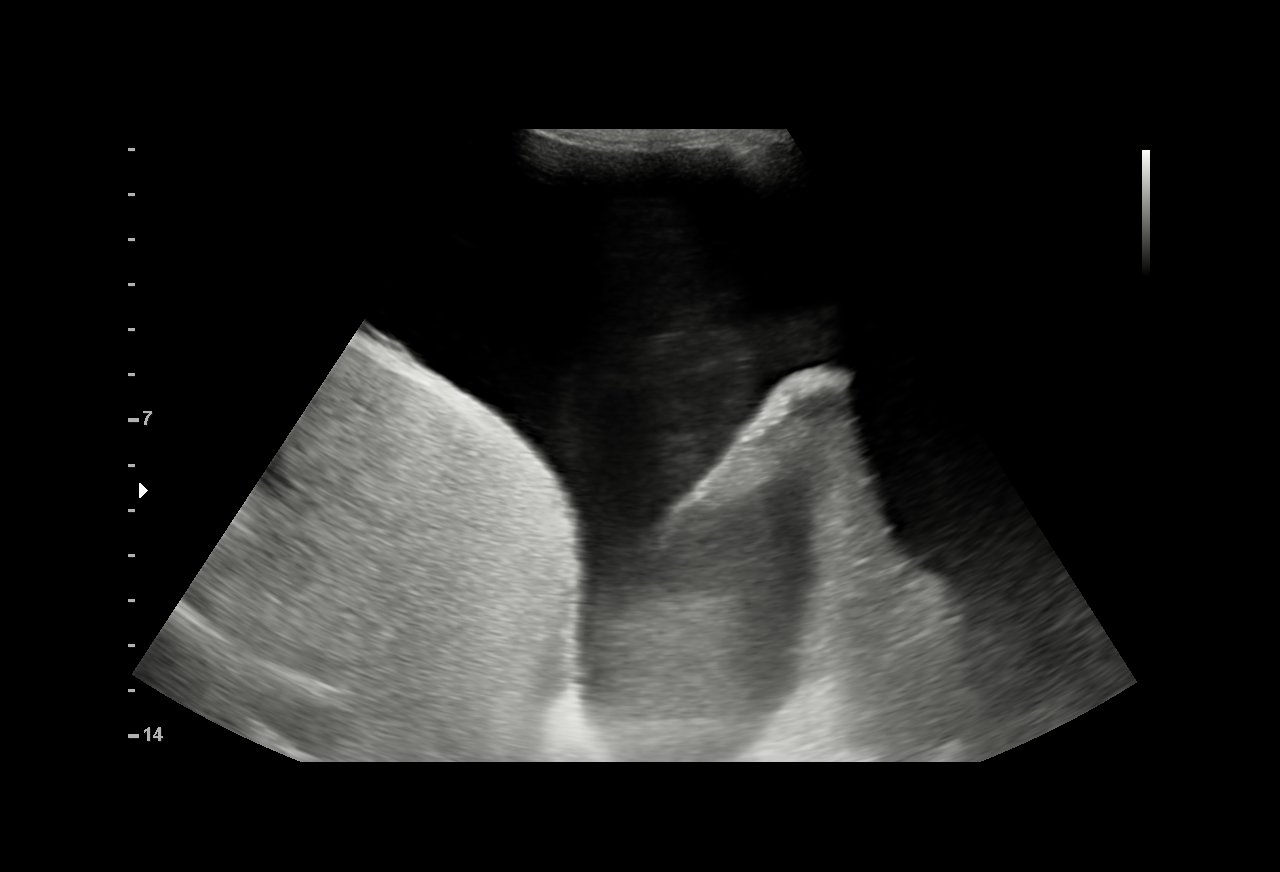
[im 4/4]
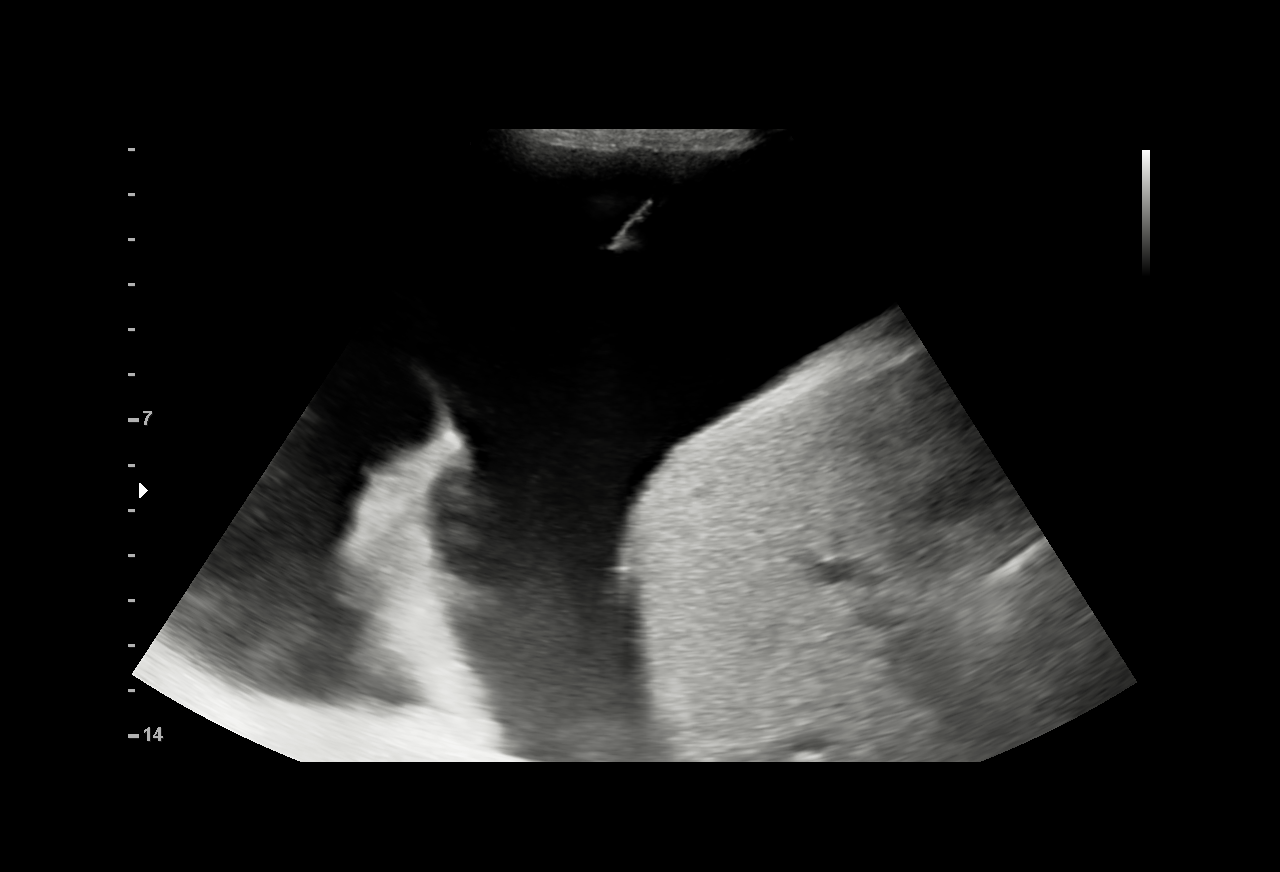

[4 of 4 positions shown; findings below may reference images not displayed]

EXAM:
1. Ultrasound-guided access of right internal jugular vein and
common femoral vein.
2. Right heart pressure measurements.
3. ICE evaluation of hepatic vascular anatomy

MEDICATIONS:
As antibiotic prophylaxis, Rocephin 1 gm IV was ordered
pre-procedure and administered intravenously within one hour of
incision. One unit of platelets was also administered IV during the
procedure.

ANESTHESIA/SEDATION:
General - as administered by the Anesthesia department

CONTRAST:  None

FLUOROSCOPY:
Radiation Exposure Index (as provided by the fluoroscopic device):
17 mGy Kerma

COMPLICATIONS:
None immediate.

PROCEDURE:
Informed written consent was obtained from the patient after a
thorough discussion of the procedural risks, benefits and
alternatives. All questions were addressed. Maximal Sterile Barrier
Technique was utilized including caps, mask, sterile gowns, sterile
gloves, sterile drape, hand hygiene and skin antiseptic. A timeout
was performed prior to the initiation of the procedure.

Patient positioned supine on the procedure table. The right groin
and neck skin prepped and draped usual fashion.

Ultrasound image documenting patency of the right common femoral
vein was obtained and placed in permanent medical record. Sterile
ultrasound probe cover and gel utilized throughout the procedure.
Utilizing continuous ultrasound guidance, the right common femoral
vein was accessed at the level of the femoral head with a 21 gauge
needle. 21 gauge needle exchanged for a transitional dilator [DATE] inch guidewire. Transitional dilator set exchanged for 8
French sheath over 0.035 inch guidewire.

ICE catheter was advanced to the level of the intrahepatic IVC.
Ultrasound evaluation demonstrated patent hepatic and portal vein.

Ultrasound image documenting patency of the right internal jugular
vein was obtained and placed in permanent medical record. Sterile
ultrasound probe cover and gel utilized throughout the procedure.
Utilizing continuous ultrasound guidance, the right internal jugular
vein with a 21 gauge needle. 21 gauge needle exchanged for a
transitional dilator [DATE] inch guidewire. Transitional
dilator set exchanged for 12 French sheath over 0.035 inch
guidewire, after serial dilation.

Sheath advanced to the level of the right atrium. Right heart
pressures were elevated measuring mean of 20 mm Hg. Paracentesis and
right thoracentesis were performed, however the right heart pressure
remained elevated around 16 mm Hg. Given elevated right heart
pressure, procedure was terminated.
IMPRESSION: TIPS aborted due to elevated right heart pressures.

Patient will be admitted to the [REDACTED] for overnight
observation. Outpatient follow-up with a TIPS ultrasound in 1 month.

## 2022-08-11 IMAGING — DX DG CHEST 1V PORT
1 series · 1 of 1 positions shown · non-contrast
Comparison: Chest x-ray dated May 27, 2021.

CLINICAL DATA: Status post right-sided thoracentesis.

EXAM:
PORTABLE CHEST 1 VIEW

[chest ap]
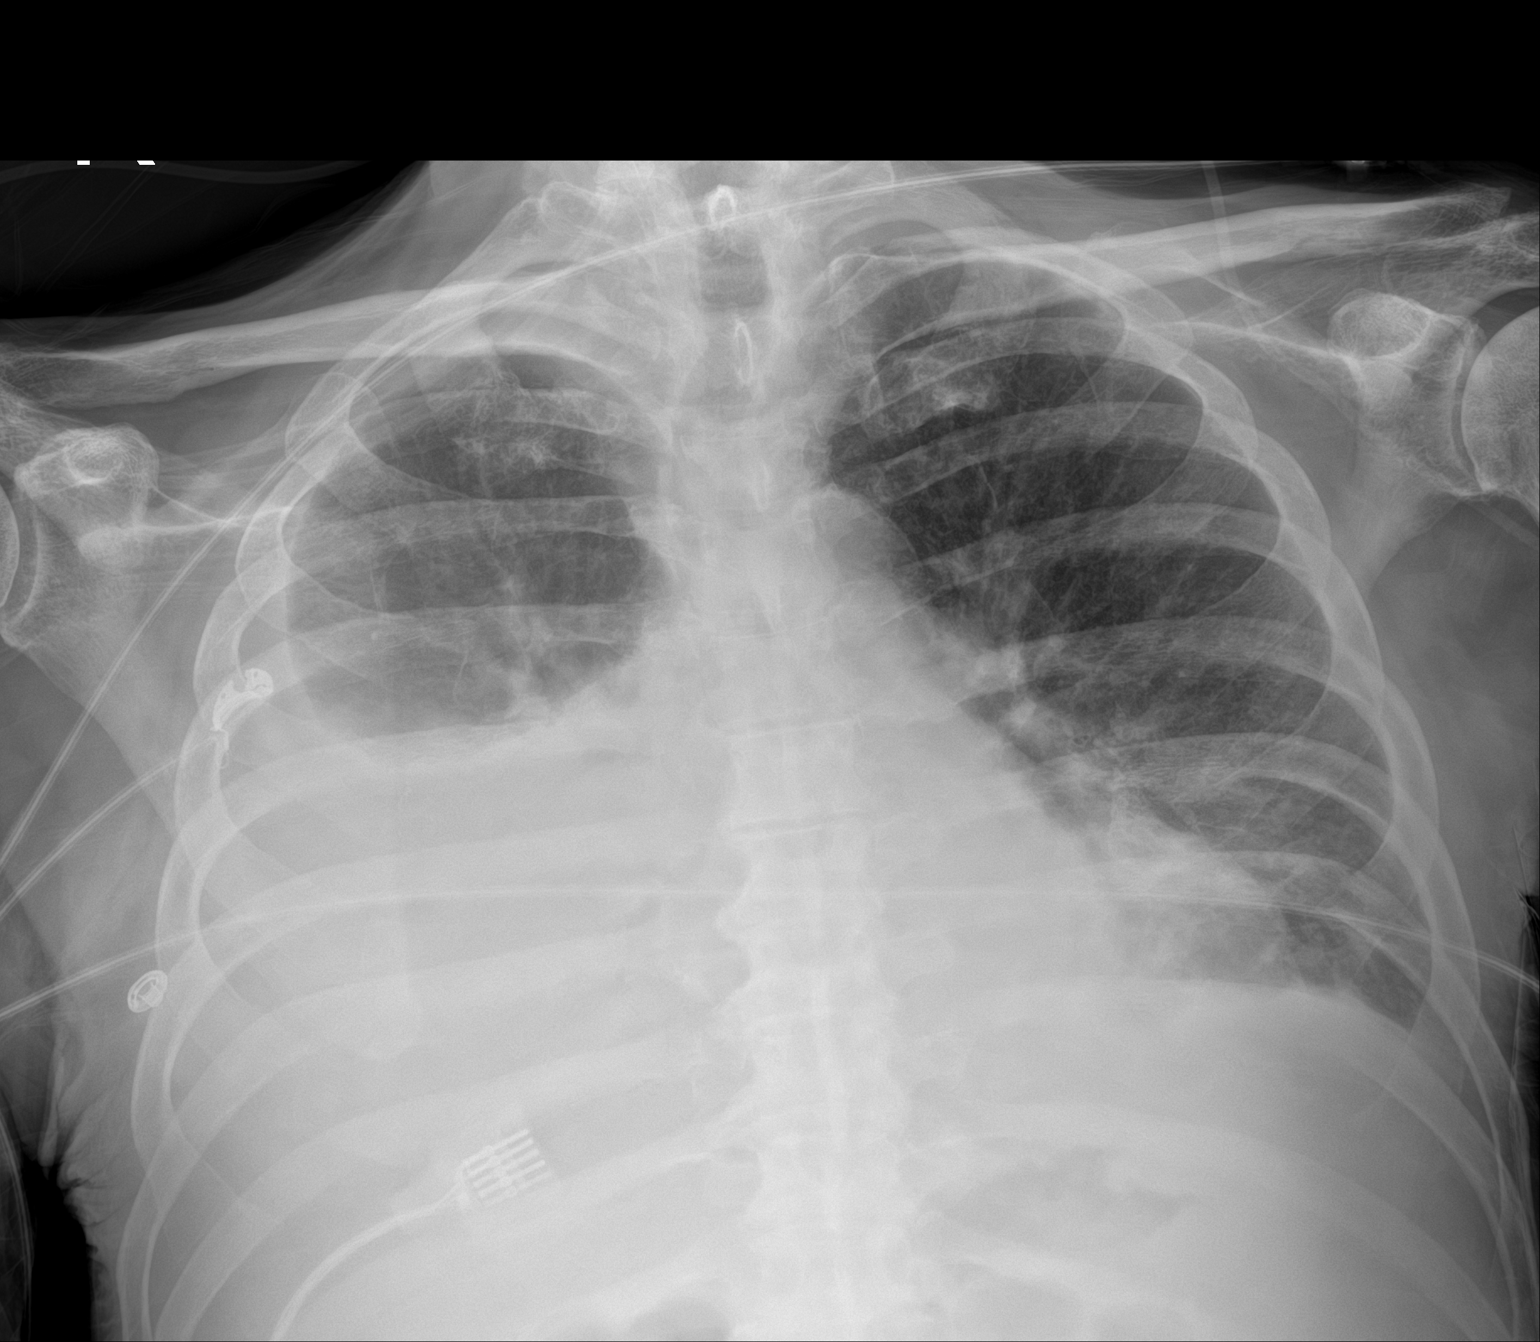

[1 of 1 positions shown; findings below may reference images not displayed]

FINDINGS: Normal heart size. Moderate right pleural effusion with right middle
and lower lobe collapse. Mild left basilar atelectasis. No
pneumothorax. No acute osseous abnormality.
IMPRESSION: 1. Moderate right pleural effusion.  No pneumothorax.
